# Patient Record
Sex: Male | Born: 1975 | Race: Black or African American | Hispanic: No | Marital: Single | Smoking: Current every day smoker
Health system: Southern US, Community
[De-identification: ages and names within clinical notes are randomized; demographics above are authoritative.]

## PROBLEM LIST (undated history)

## (undated) DIAGNOSIS — R011 Cardiac murmur, unspecified: Secondary | ICD-10-CM

## (undated) DIAGNOSIS — Z9289 Personal history of other medical treatment: Secondary | ICD-10-CM

## (undated) DIAGNOSIS — D649 Anemia, unspecified: Secondary | ICD-10-CM

## (undated) DIAGNOSIS — K859 Acute pancreatitis without necrosis or infection, unspecified: Secondary | ICD-10-CM

## (undated) DIAGNOSIS — I10 Essential (primary) hypertension: Secondary | ICD-10-CM

## (undated) DIAGNOSIS — G629 Polyneuropathy, unspecified: Secondary | ICD-10-CM

## (undated) DIAGNOSIS — S92352A Displaced fracture of fifth metatarsal bone, left foot, initial encounter for closed fracture: Secondary | ICD-10-CM

## (undated) DIAGNOSIS — J309 Allergic rhinitis, unspecified: Secondary | ICD-10-CM

## (undated) DIAGNOSIS — K279 Peptic ulcer, site unspecified, unspecified as acute or chronic, without hemorrhage or perforation: Secondary | ICD-10-CM

## (undated) DIAGNOSIS — J189 Pneumonia, unspecified organism: Secondary | ICD-10-CM

## (undated) DIAGNOSIS — J449 Chronic obstructive pulmonary disease, unspecified: Secondary | ICD-10-CM

## (undated) DIAGNOSIS — L309 Dermatitis, unspecified: Secondary | ICD-10-CM

## (undated) DIAGNOSIS — F1010 Alcohol abuse, uncomplicated: Secondary | ICD-10-CM

## (undated) HISTORY — PX: APPENDECTOMY: SHX54

---

## 2005-04-21 ENCOUNTER — Emergency Department: Payer: Self-pay | Admitting: Emergency Medicine

## 2005-04-21 IMAGING — CR DG TIBIA/FIBULA 2V*L*
1 series · 3 of 3 positions shown · non-contrast
Comparison: none

REASON FOR EXAM: Rule out fracture
COMMENTS:  LMP: (Male)

PROCEDURE:     DXR - DXR TIBIA AND FIBULA LT (LOWER L  - [DATE]  [DATE]
RESULT:     Multiple views of the LEFT tibia and fibula show no evidence of
fracture, foreign body or soft tissue swelling.

[Series 1: view not recorded · 0.17mm/px · 3 of 3 slices shown]
[im 1/3]
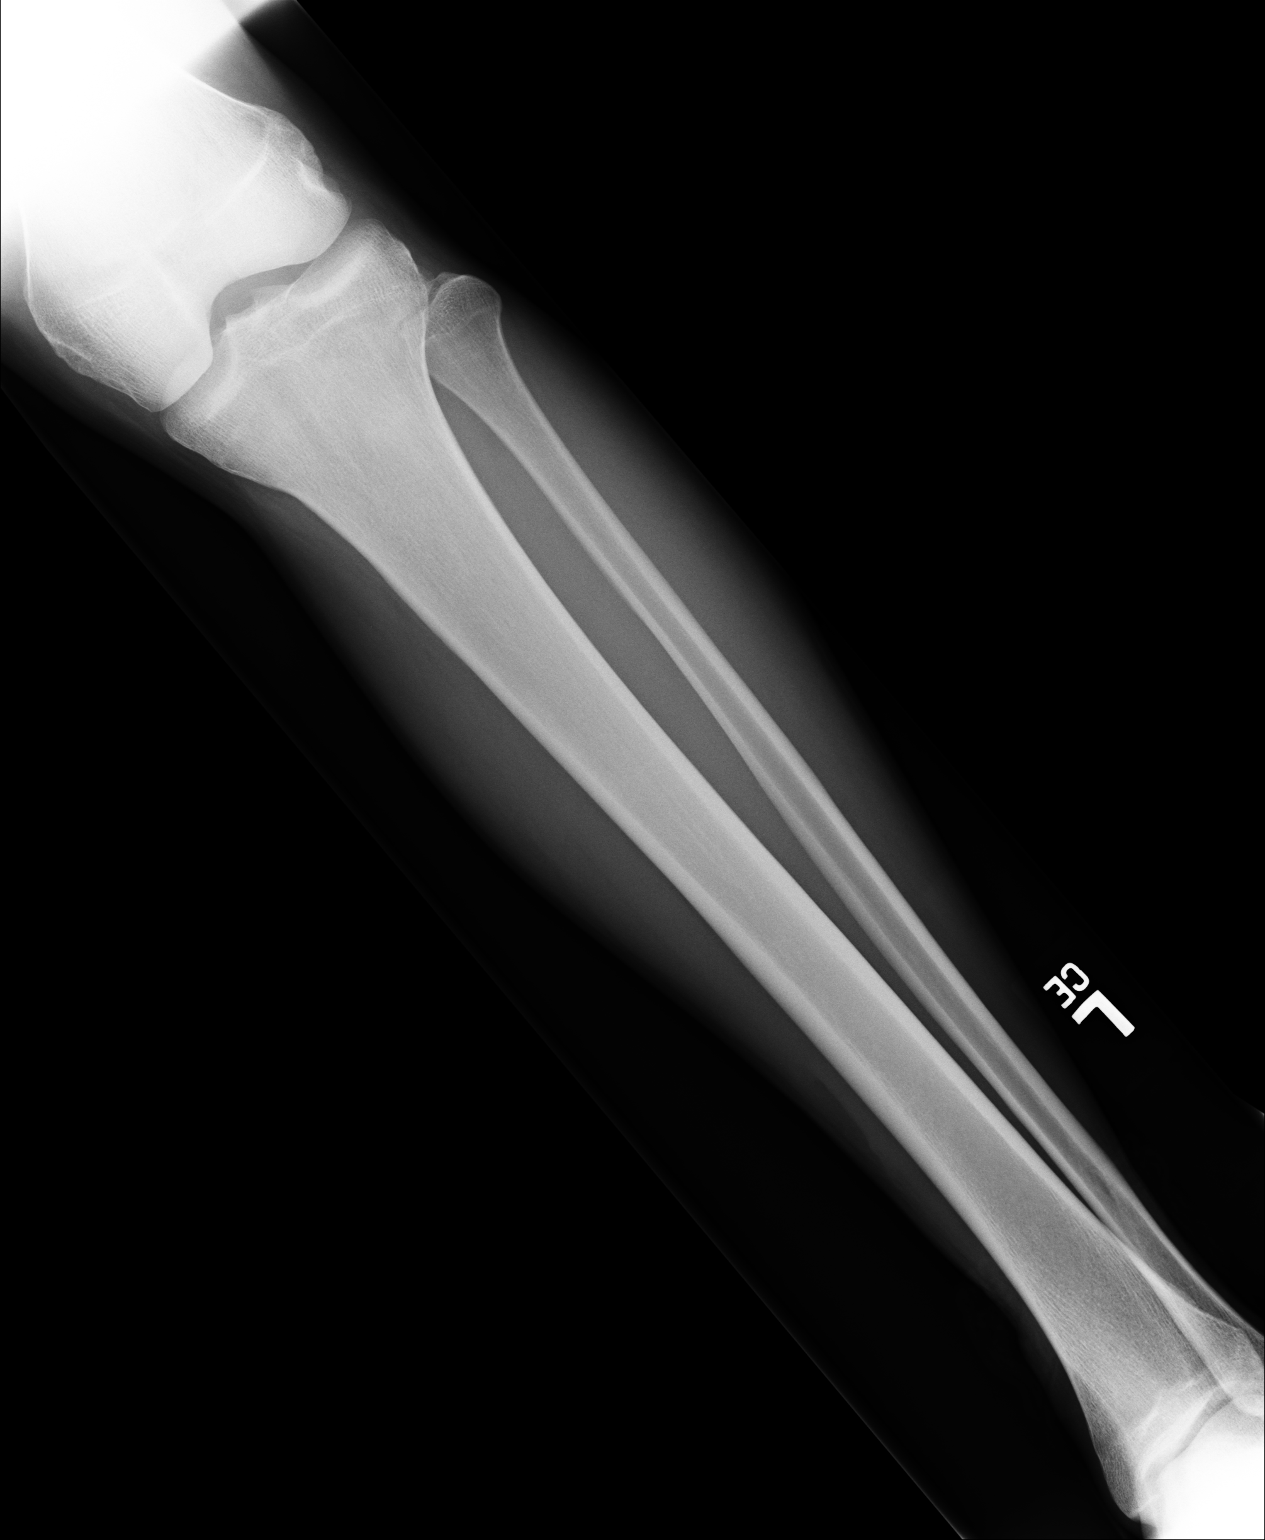
[im 2/3]
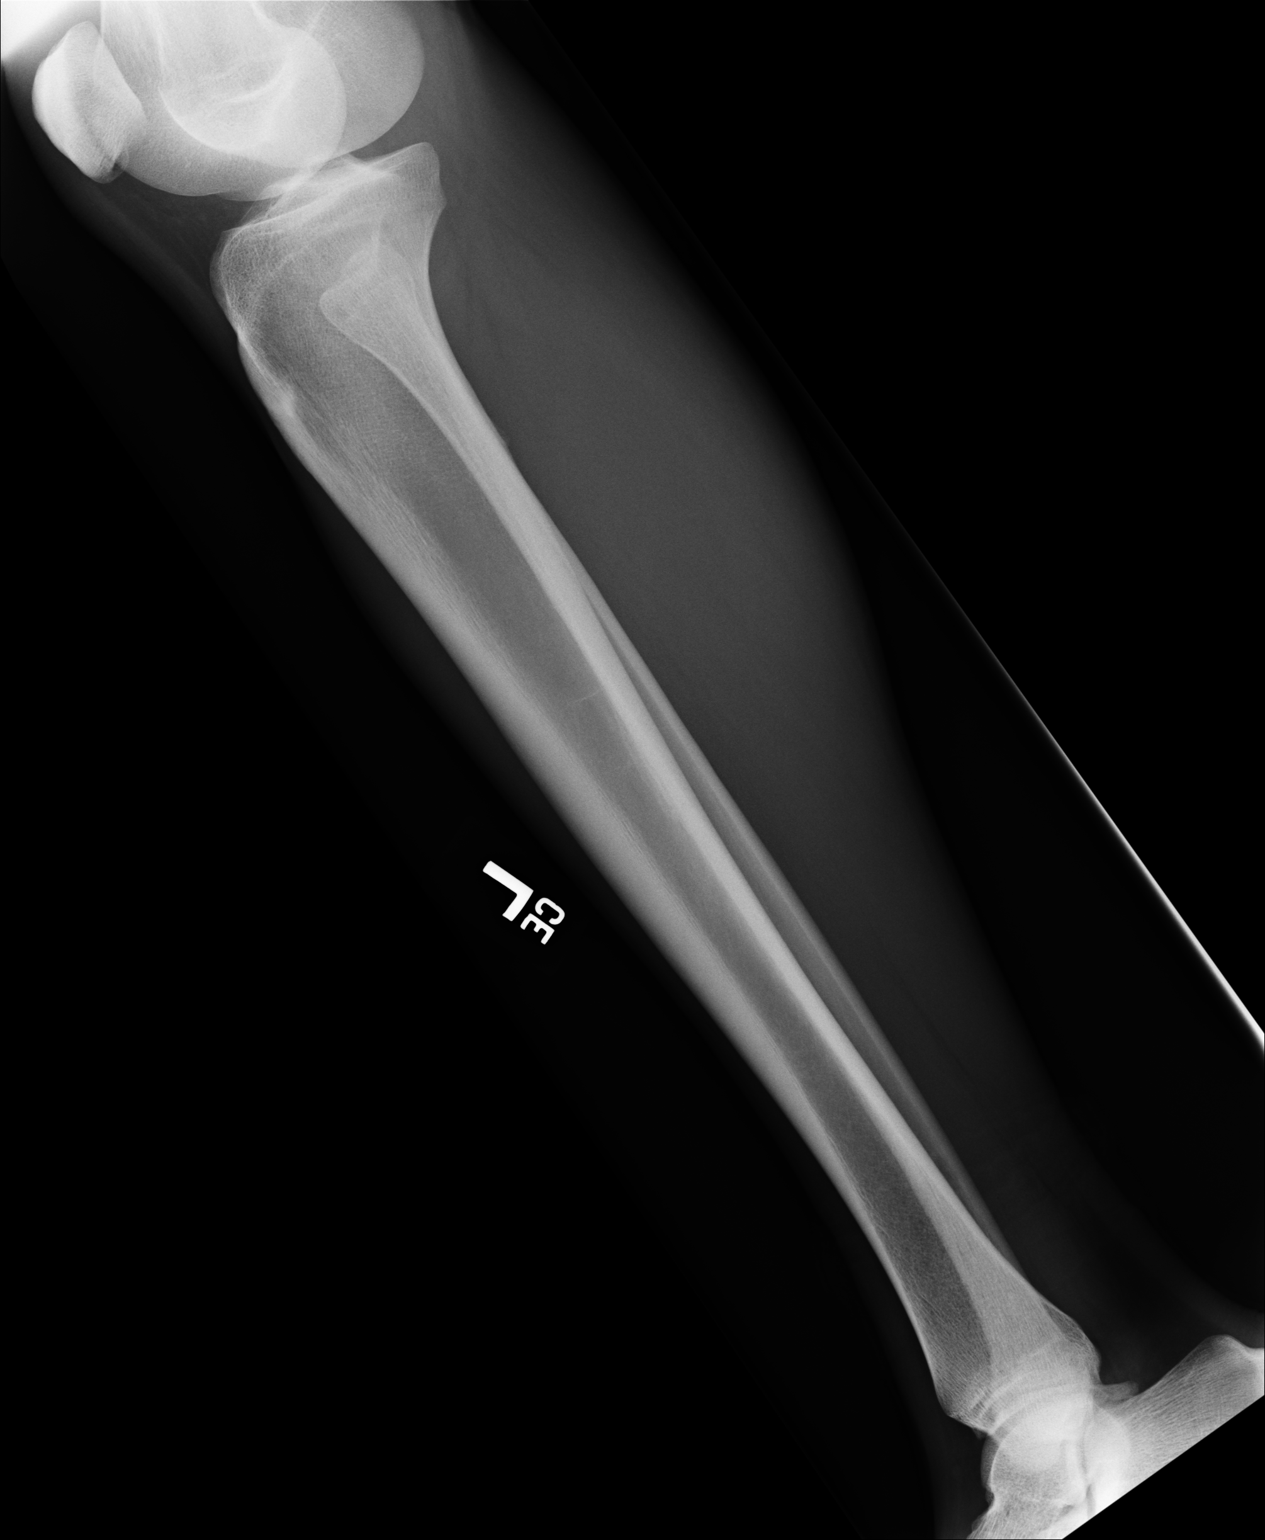
[im 3/3]
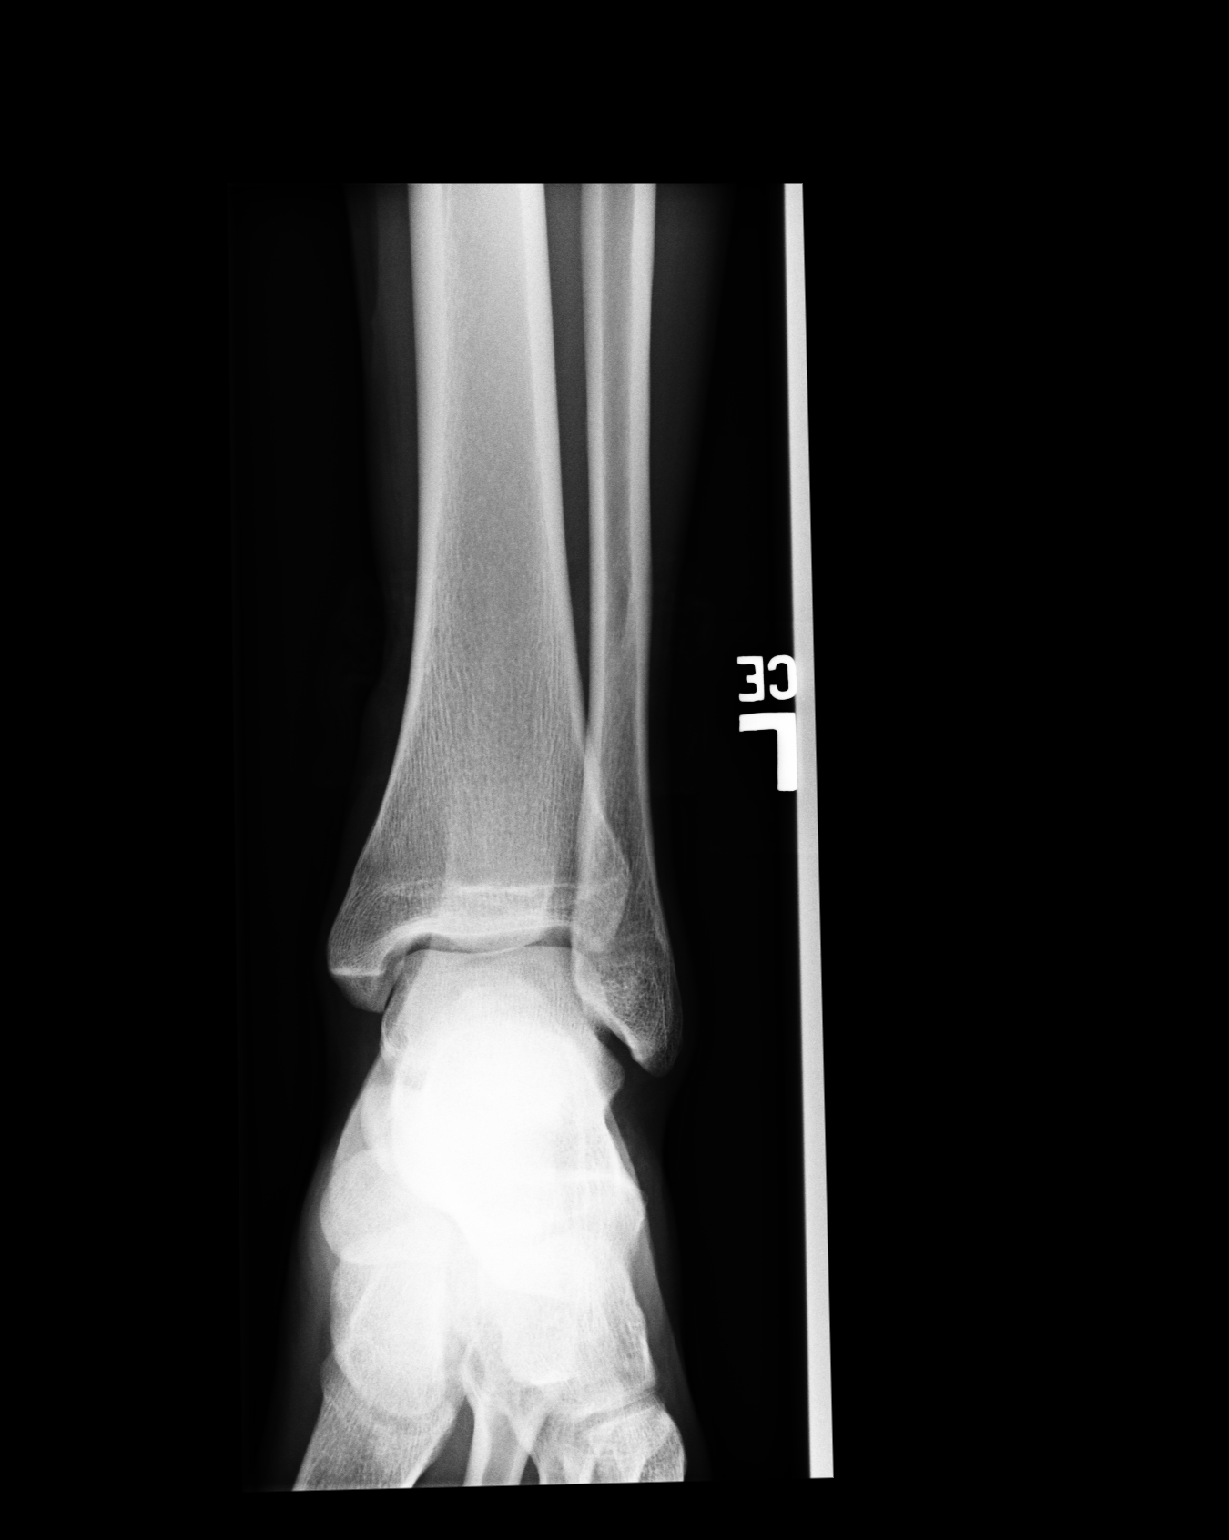

[3 of 3 positions shown; findings below may reference images not displayed]

IMPRESSION: Normal LEFT tibia and fibula.

## 2005-08-30 ENCOUNTER — Emergency Department: Payer: Self-pay | Admitting: Emergency Medicine

## 2008-11-08 ENCOUNTER — Emergency Department: Payer: Self-pay | Admitting: Emergency Medicine
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
## 2011-09-17 ENCOUNTER — Emergency Department: Payer: Self-pay | Admitting: Emergency Medicine

## 2013-02-04 ENCOUNTER — Emergency Department: Payer: Self-pay | Admitting: Internal Medicine

## 2013-08-07 ENCOUNTER — Emergency Department: Payer: Self-pay | Admitting: Emergency Medicine

## 2013-08-07 LAB — CBC
HCT: 45 % (ref 40.0–52.0)
MCH: 32.2 pg (ref 26.0–34.0)
MCHC: 35.4 g/dL (ref 32.0–36.0)
MCV: 91 fL (ref 80–100)
RBC: 4.95 10*6/uL (ref 4.40–5.90)
RDW: 13.3 % (ref 11.5–14.5)

## 2013-08-07 LAB — COMPREHENSIVE METABOLIC PANEL
Alkaline Phosphatase: 174 U/L — ABNORMAL HIGH (ref 50–136)
Anion Gap: 8 (ref 7–16)
BUN: 4 mg/dL — ABNORMAL LOW (ref 7–18)
Bilirubin,Total: 0.3 mg/dL (ref 0.2–1.0)
Calcium, Total: 8.7 mg/dL (ref 8.5–10.1)
Creatinine: 0.94 mg/dL (ref 0.60–1.30)
EGFR (African American): >60
Glucose: 116 mg/dL — ABNORMAL HIGH (ref 65–99)
Osmolality: 268 (ref 275–301)
Potassium: 3.9 mmol/L (ref 3.5–5.1)

## 2013-08-07 LAB — URINALYSIS, COMPLETE
Glucose,UR: NEGATIVE mg/dL (ref 0–75)
Ketone: NEGATIVE
Nitrite: NEGATIVE
Ph: 5 (ref 4.5–8.0)
Protein: 100
Specific Gravity: 1.003 (ref 1.003–1.030)
Squamous Epithelial: NONE SEEN

## 2013-08-07 LAB — DRUG SCREEN, URINE
Amphetamines, Ur Screen: NEGATIVE (ref ?–1000)
Barbiturates, Ur Screen: NEGATIVE (ref ?–200)
Benzodiazepine, Ur Scrn: NEGATIVE (ref ?–200)
Cannabinoid 50 Ng, Ur ~~LOC~~: NEGATIVE (ref ?–50)
MDMA (Ecstasy)Ur Screen: NEGATIVE (ref ?–500)
Phencyclidine (PCP) Ur S: NEGATIVE (ref ?–25)
Tricyclic, Ur Screen: NEGATIVE (ref ?–1000)

## 2013-08-07 LAB — ETHANOL
Ethanol %: 0.438 % (ref 0.000–0.080)
Ethanol: 438 mg/dL

## 2013-08-07 LAB — TSH: Thyroid Stimulating Horm: 0.83 u[IU]/mL

## 2013-08-08 LAB — ETHANOL
Ethanol %: 0.122 % — ABNORMAL HIGH (ref 0.000–0.080)
Ethanol %: 0.202 % — ABNORMAL HIGH (ref 0.000–0.080)
Ethanol: 122 mg/dL

## 2014-01-18 ENCOUNTER — Emergency Department: Payer: Self-pay | Admitting: Internal Medicine

## 2014-01-18 LAB — URINALYSIS, COMPLETE
BACTERIA: NONE SEEN
BILIRUBIN, UR: NEGATIVE
Glucose,UR: NEGATIVE mg/dL (ref 0–75)
Granular Cast: 79
Hyaline Cast: 1
KETONE: NEGATIVE
Leukocyte Esterase: NEGATIVE
Nitrite: NEGATIVE
Ph: 5 (ref 4.5–8.0)
SPECIFIC GRAVITY: 1.013 (ref 1.003–1.030)
Squamous Epithelial: <1
WBC UR: NONE SEEN /HPF (ref 0–5)

## 2014-01-18 LAB — DRUG SCREEN, URINE

## 2014-01-18 LAB — ETHANOL

## 2014-01-18 LAB — COMPREHENSIVE METABOLIC PANEL
ALT: 32 U/L (ref 12–78)
AST: 54 U/L — AB (ref 15–37)
Albumin: 3.3 g/dL — ABNORMAL LOW (ref 3.4–5.0)
Alkaline Phosphatase: 174 U/L — ABNORMAL HIGH
Anion Gap: 13 (ref 7–16)
BUN: 9 mg/dL (ref 7–18)
Bilirubin,Total: 0.4 mg/dL (ref 0.2–1.0)
Calcium, Total: 8.8 mg/dL (ref 8.5–10.1)
Chloride: 103 mmol/L (ref 98–107)
Co2: 18 mmol/L — ABNORMAL LOW (ref 21–32)
Creatinine: 1.25 mg/dL (ref 0.60–1.30)
EGFR (Non-African Amer.): >60
Glucose: 178 mg/dL — ABNORMAL HIGH (ref 65–99)
Osmolality: 271 (ref 275–301)
Potassium: 4 mmol/L (ref 3.5–5.1)
Sodium: 134 mmol/L — ABNORMAL LOW (ref 136–145)
Total Protein: 7.8 g/dL (ref 6.4–8.2)

## 2014-01-18 LAB — PROTIME-INR
INR: 0.9
Prothrombin Time: 12.3 secs (ref 11.5–14.7)

## 2014-01-18 LAB — CBC
HCT: 40.4 % (ref 40.0–52.0)
HGB: 13.4 g/dL (ref 13.0–18.0)
MCH: 31 pg (ref 26.0–34.0)
MCHC: 33.2 g/dL (ref 32.0–36.0)
MCV: 93 fL (ref 80–100)
PLATELETS: 122 10*3/uL — AB (ref 150–440)
RBC: 4.33 10*6/uL — AB (ref 4.40–5.90)
RDW: 13.9 % (ref 11.5–14.5)
WBC: 6.6 10*3/uL (ref 3.8–10.6)

## 2014-01-18 IMAGING — CR DG CHEST 2V
1 series · 2 of 2 positions shown · non-contrast
Comparison: None.

CLINICAL DATA: Seizure today, no hx seizure, smoker, no prev surg

EXAM:
CHEST  2 VIEW

[Series 1: w chest pa · 0.14mm/px · 2 of 2 slices shown]
[im 1/2]
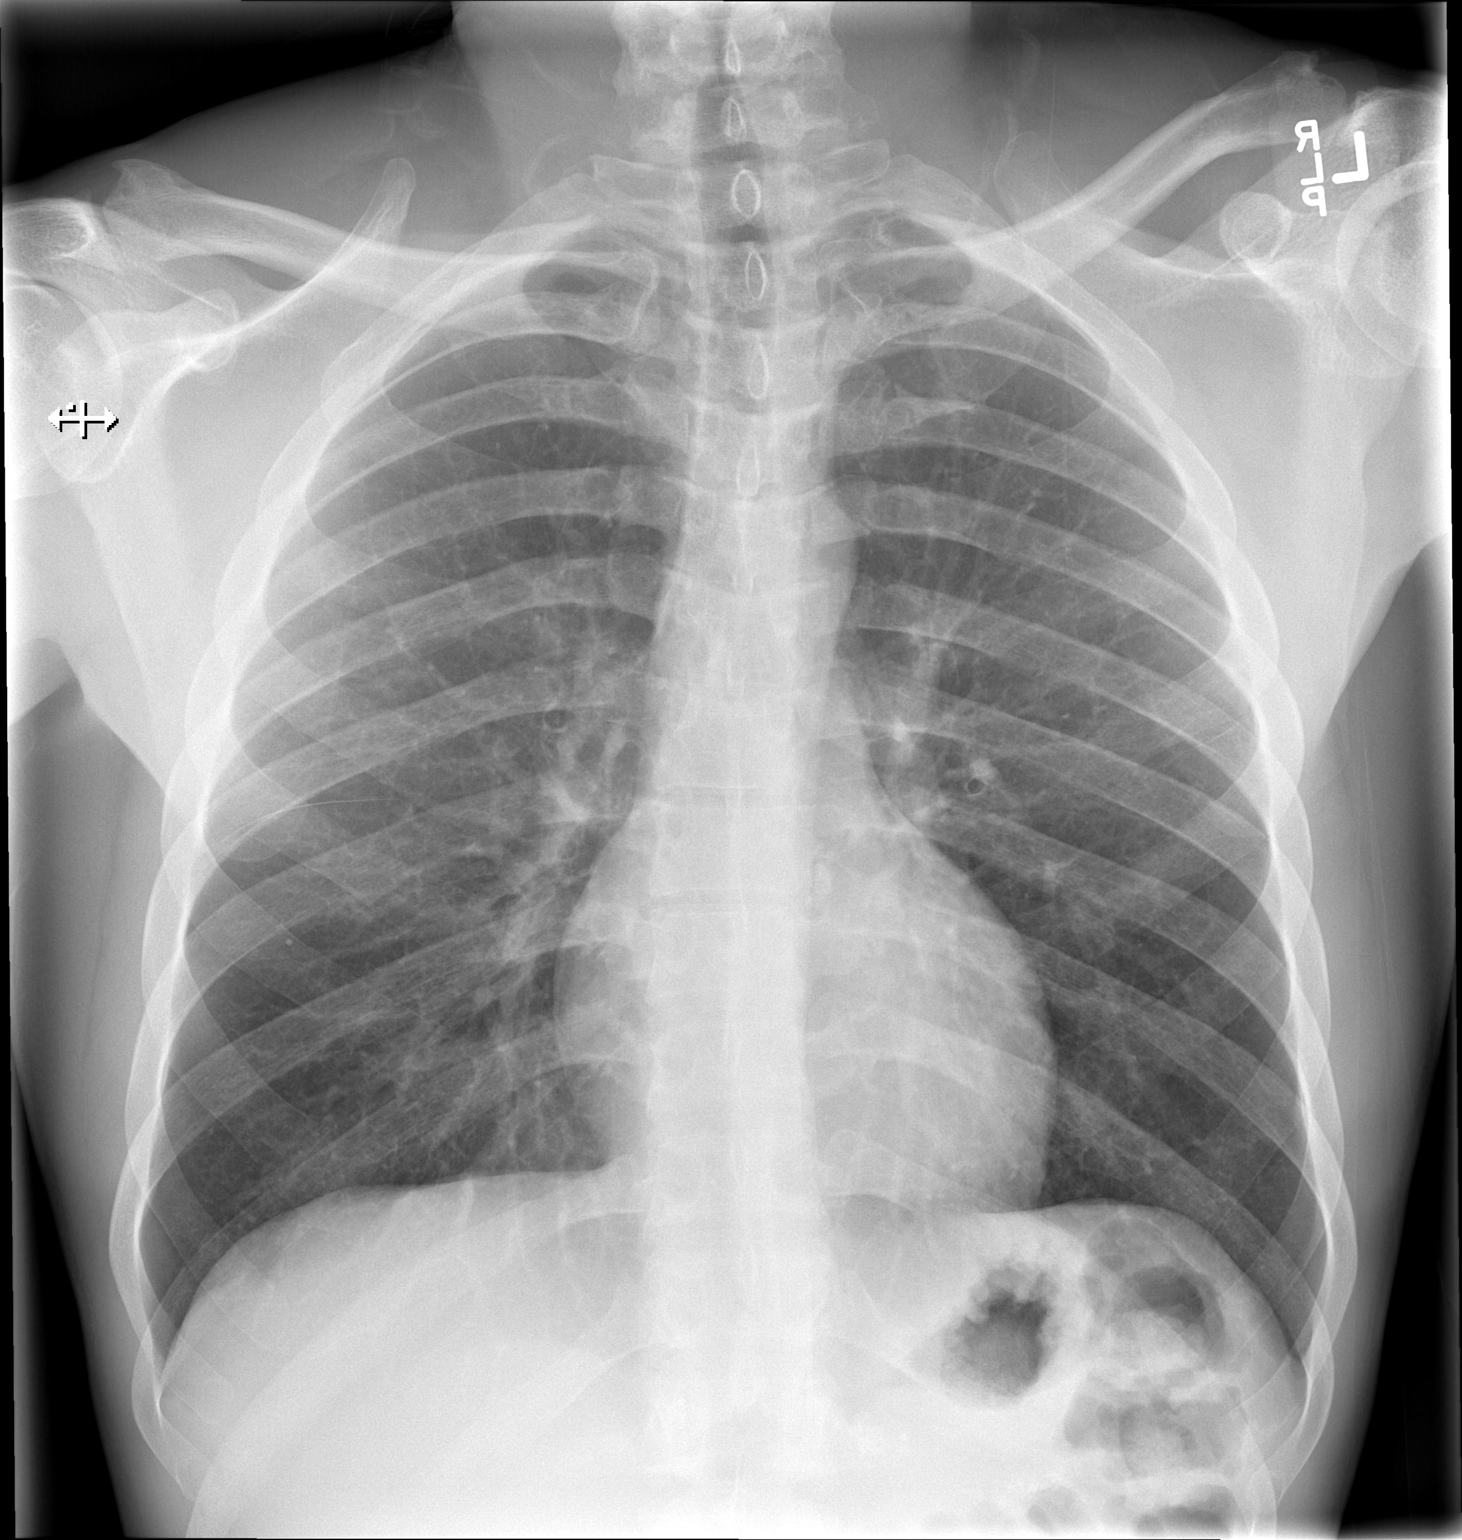
[im 2/2]
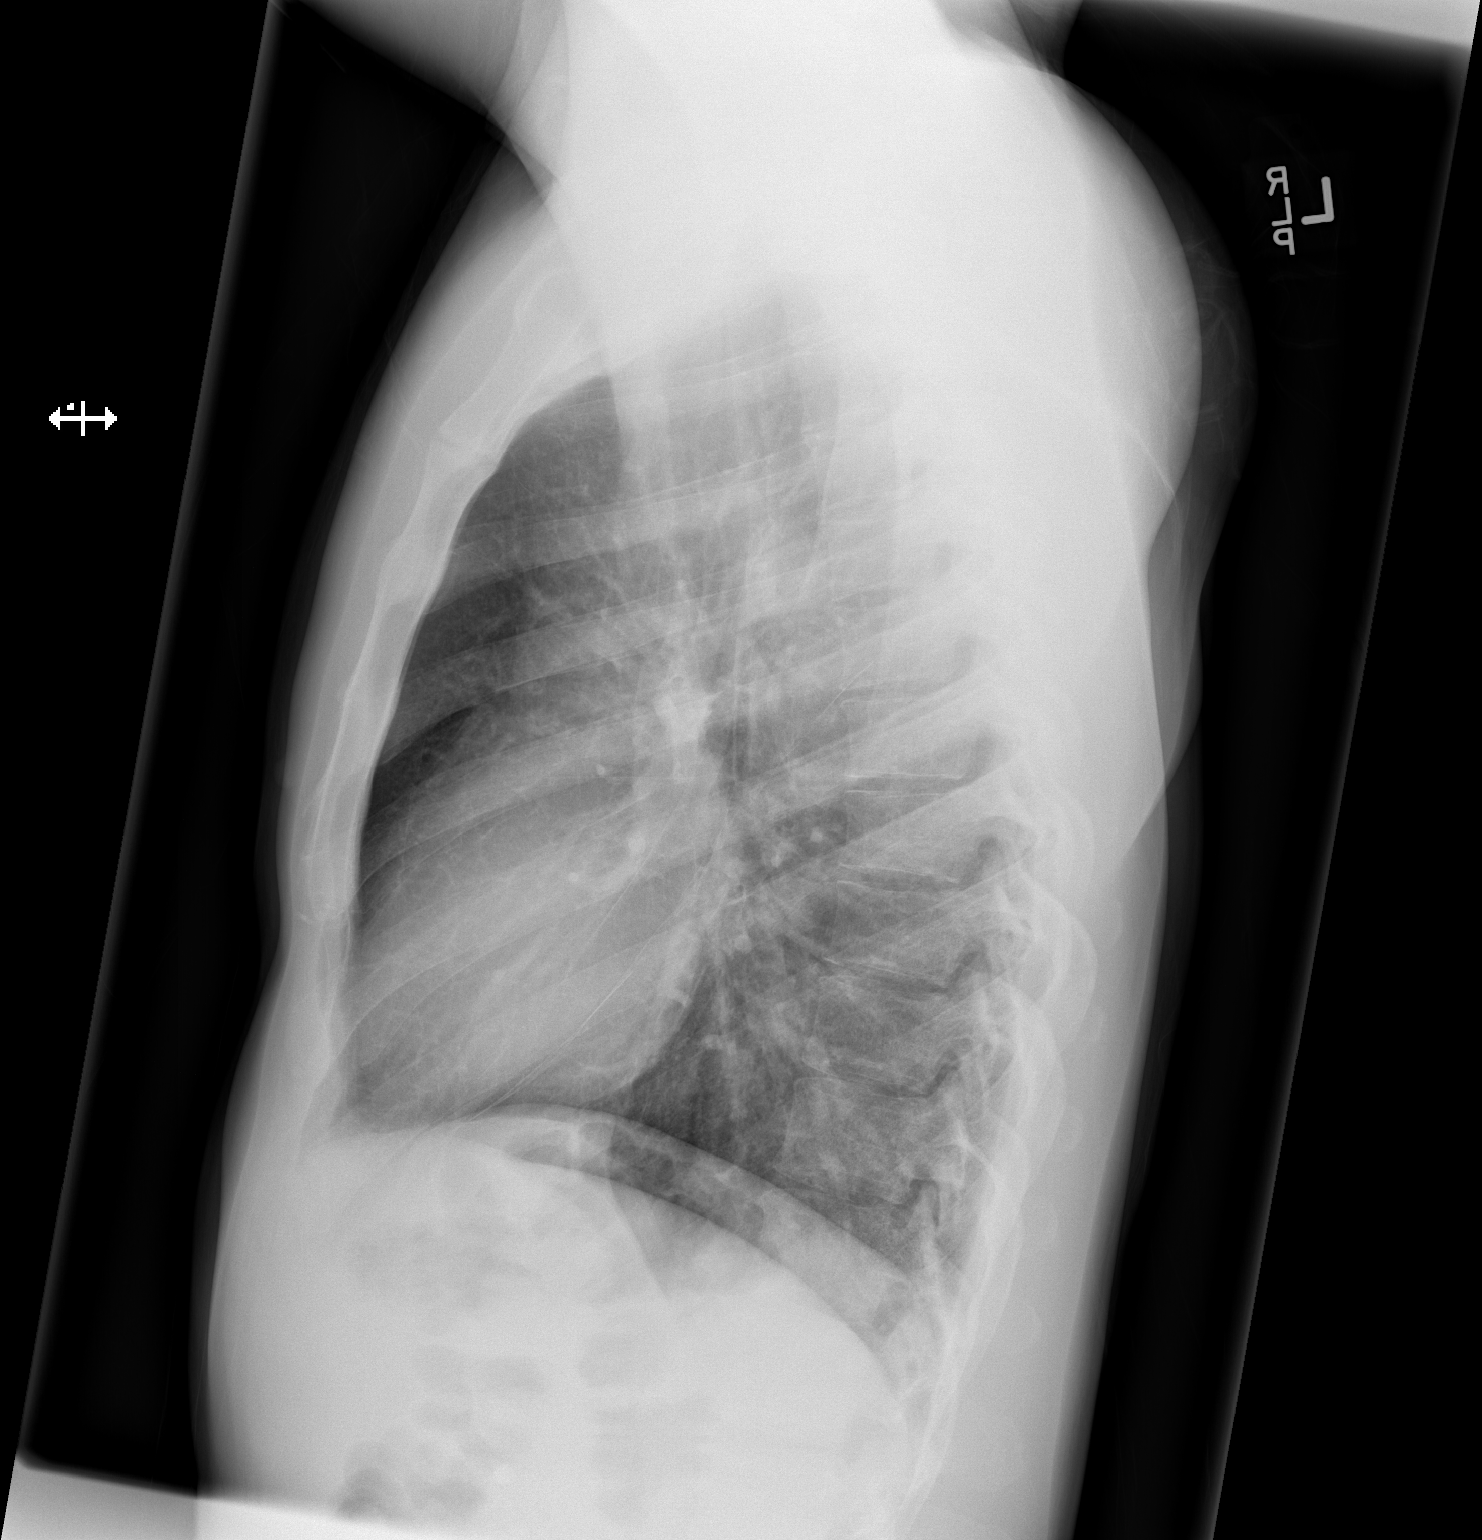

[2 of 2 positions shown; findings below may reference images not displayed]

FINDINGS: The heart size and mediastinal contours are within normal limits.
Both lungs are clear. The visualized skeletal structures are
unremarkable.
IMPRESSION: No active cardiopulmonary disease.
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
## 2014-01-18 IMAGING — CT CT HEAD WITHOUT CONTRAST
1 series · 16 of 30 positions shown, 20 images · non-contrast
Comparison: None.

CLINICAL DATA: Seizure.

EXAM:
CT HEAD WITHOUT CONTRAST
TECHNIQUE: Contiguous axial images were obtained from the base of the skull
through the vertex without intravenous contrast.

[Series 2: head wo · axial · 0.41mm/px · z∈[+61,+191]mm · 16 of 30 slices shown, 20 images]
[im 2/30  brain]
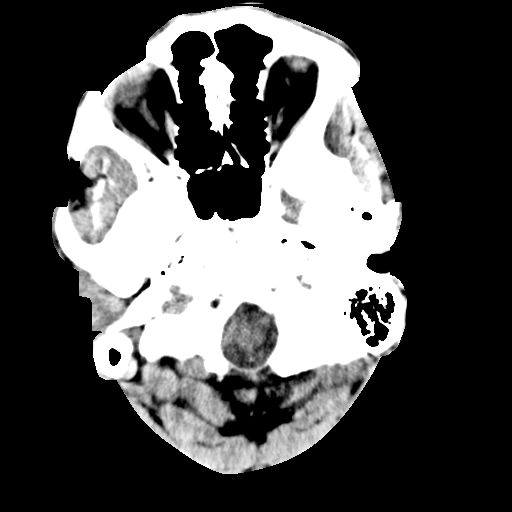
[im 2/30  bone]
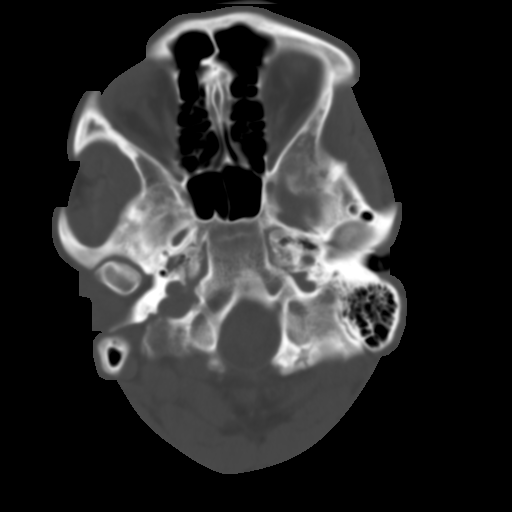
[im 4/30  brain]
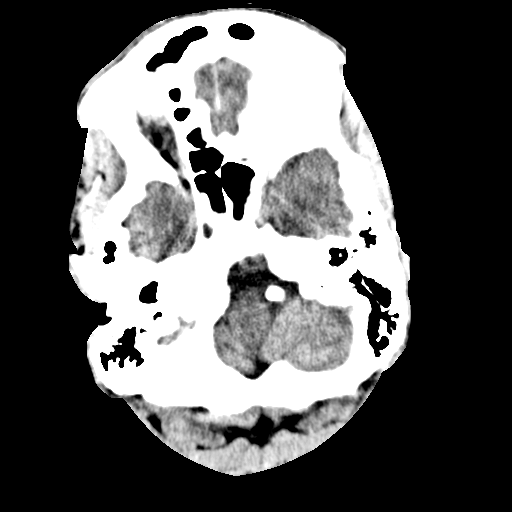
[im 6/30  brain]
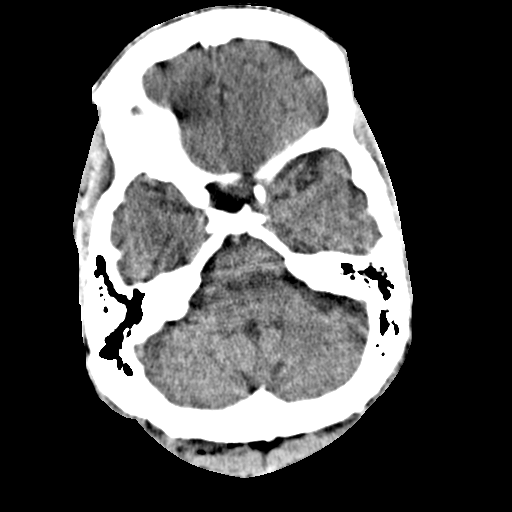
[im 8/30  brain]
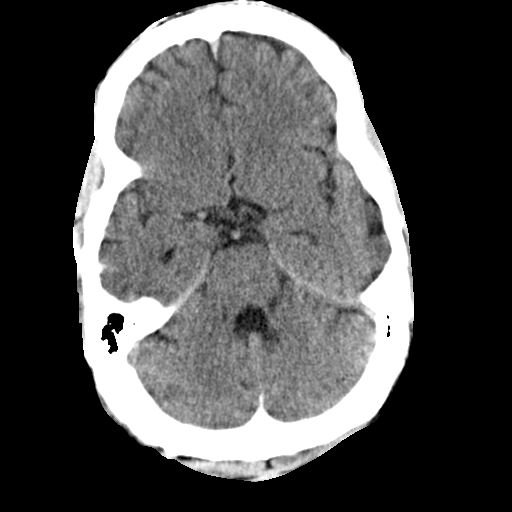
[im 9/30  brain]
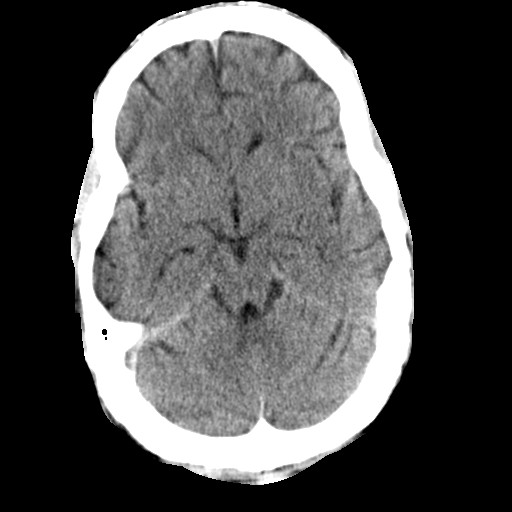
[im 9/30  bone]
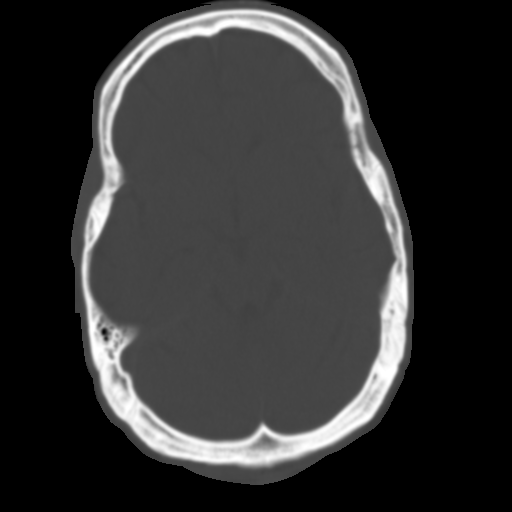
[im 11/30  brain]
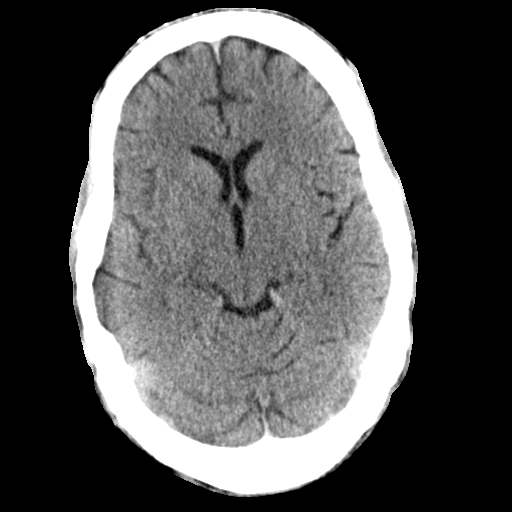
[im 13/30  brain]
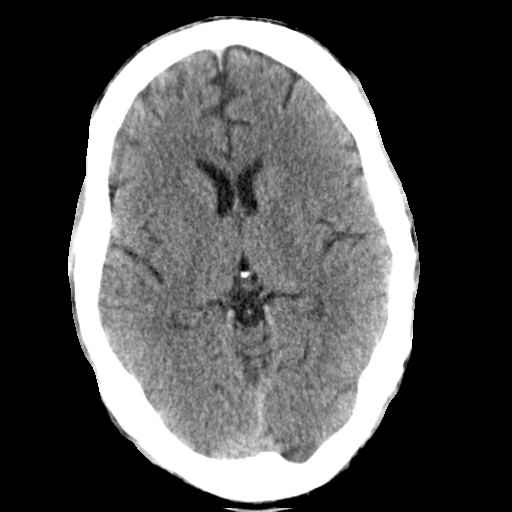
[im 15/30  brain]
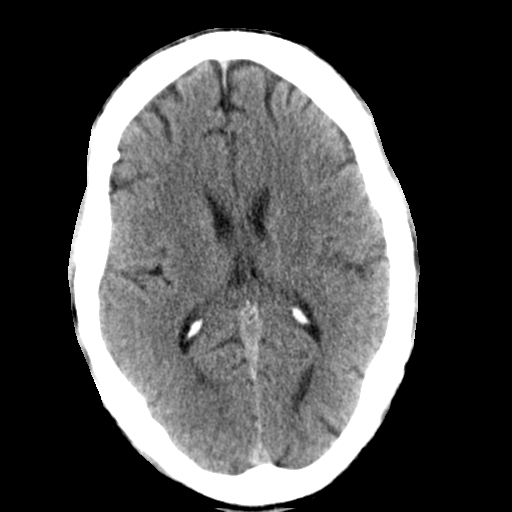
[im 16/30  brain]
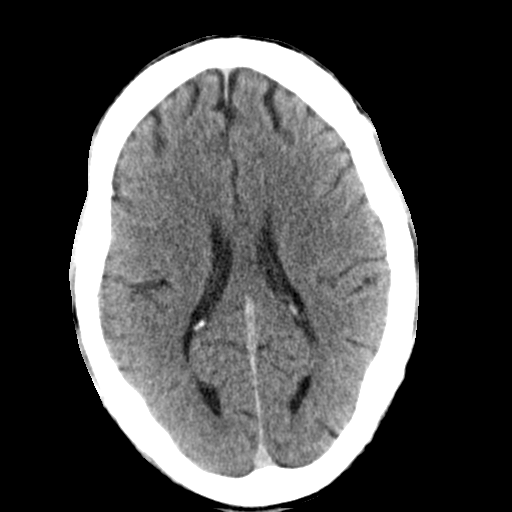
[im 16/30  bone]
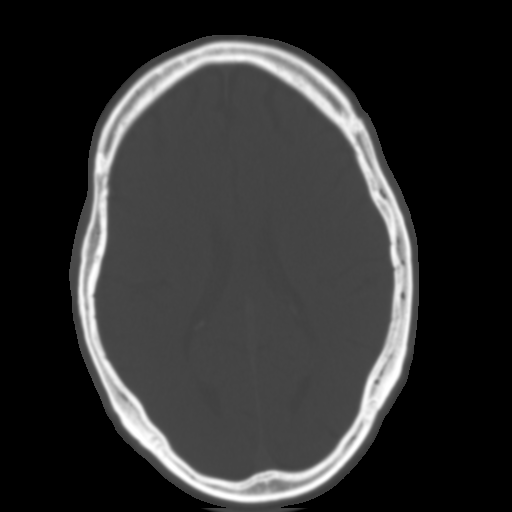
[im 18/30  brain]
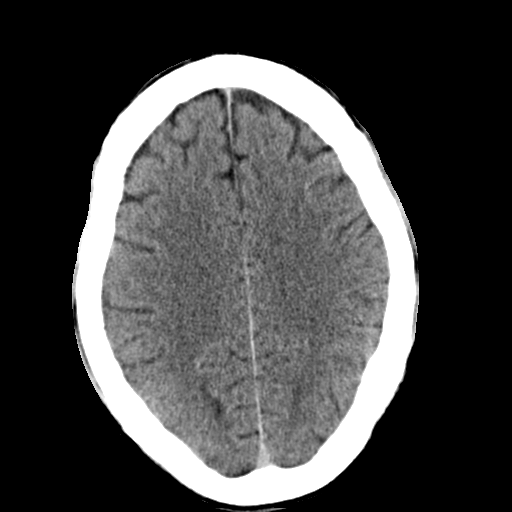
[im 20/30  brain]
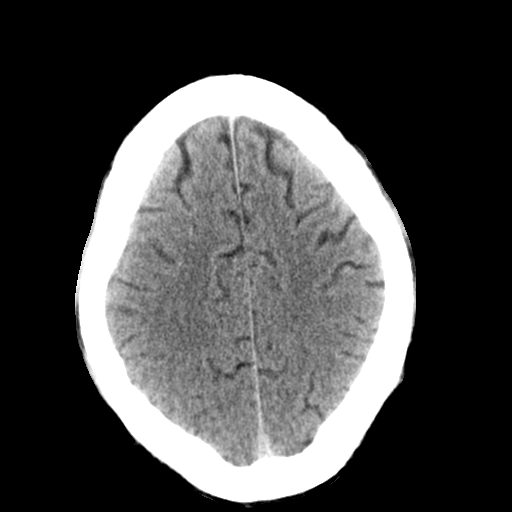
[im 22/30  brain]
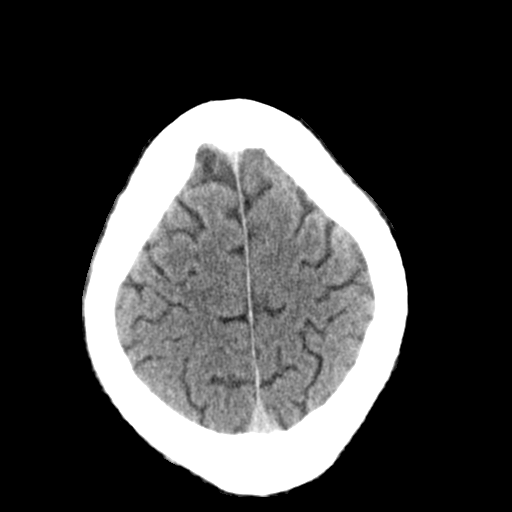
[im 23/30  brain]
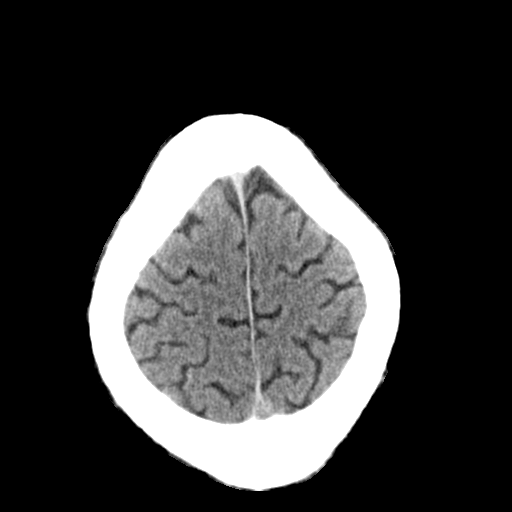
[im 23/30  bone]
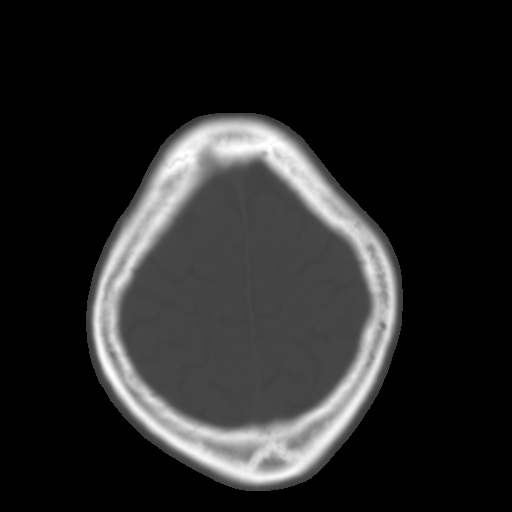
[im 25/30  brain]
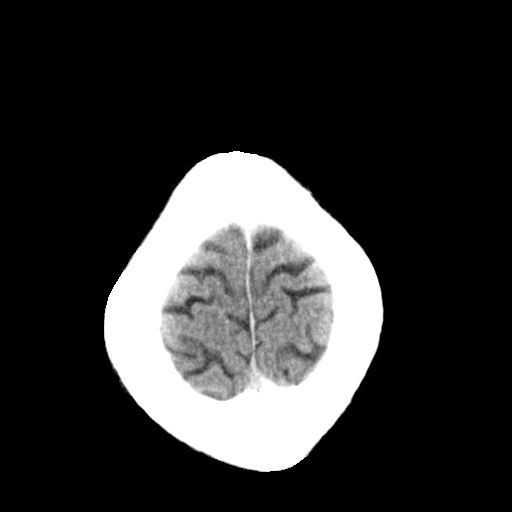
[im 27/30  brain]
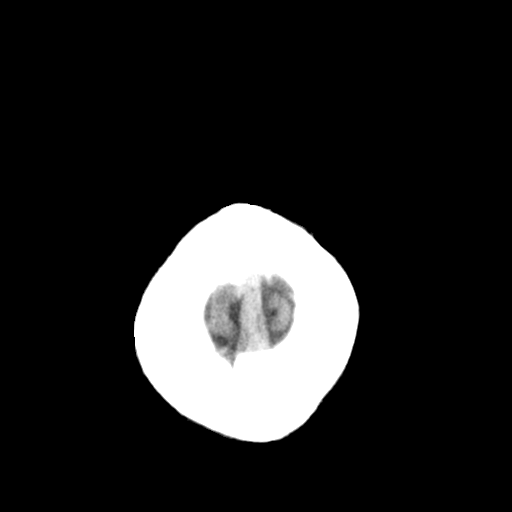
[im 29/30  brain]
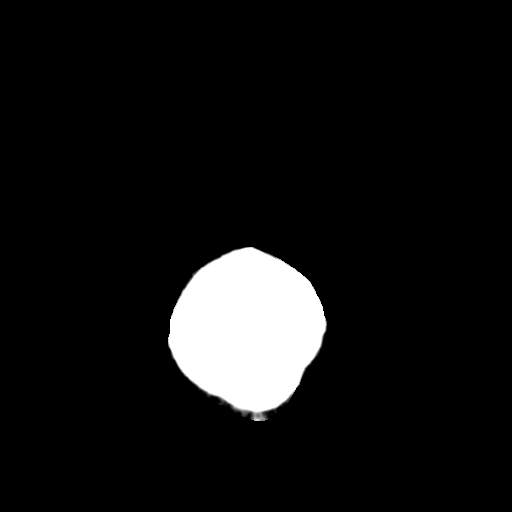

[16 of 30 positions shown; findings below may reference images not displayed]

FINDINGS: No evidence of intracranial hemorrhage, brain edema, or other signs
of acute infarction. No evidence of intracranial mass lesion or mass
effect. No abnormal extraaxial fluid collections identified.
Ventricles are normal in size. No skull abnormality identified.
IMPRESSION: Negative noncontrast head CT.
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
## 2015-06-07 ENCOUNTER — Emergency Department
Admission: EM | Admit: 2015-06-07 | Discharge: 2015-06-07 | Disposition: A | Discharge status: Home / Self Care | Payer: Self-pay | Attending: Emergency Medicine | Admitting: Emergency Medicine

## 2015-06-07 ENCOUNTER — Encounter: Payer: Self-pay | Admitting: Emergency Medicine

## 2015-06-07 ENCOUNTER — Emergency Department: Payer: Self-pay

## 2015-06-07 DIAGNOSIS — Y998 Other external cause status: Secondary | ICD-10-CM | POA: Insufficient documentation

## 2015-06-07 DIAGNOSIS — W25XXXA Contact with sharp glass, initial encounter: Secondary | ICD-10-CM | POA: Insufficient documentation

## 2015-06-07 DIAGNOSIS — S51812A Laceration without foreign body of left forearm, initial encounter: Secondary | ICD-10-CM

## 2015-06-07 DIAGNOSIS — Z720 Tobacco use: Secondary | ICD-10-CM | POA: Insufficient documentation

## 2015-06-07 DIAGNOSIS — Y9389 Activity, other specified: Secondary | ICD-10-CM | POA: Insufficient documentation

## 2015-06-07 DIAGNOSIS — Y9289 Other specified places as the place of occurrence of the external cause: Secondary | ICD-10-CM | POA: Insufficient documentation

## 2015-06-07 DIAGNOSIS — S51822A Laceration with foreign body of left forearm, initial encounter: Secondary | ICD-10-CM | POA: Insufficient documentation
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
## 2015-06-07 MED ORDER — TRAMADOL HCL 50 MG PO TABS
50.0000 mg | ORAL_TABLET | Freq: Four times a day (QID) | ORAL | Status: DC | PRN
Start: 2015-06-07 — End: 2018-07-06

## 2015-06-07 MED ORDER — BACITRACIN-NEOMYCIN-POLYMYXIN 400-5-5000 EX OINT
TOPICAL_OINTMENT | Freq: Once | CUTANEOUS | Status: AC
  Administered 2015-06-07: 1 via TOPICAL
  Filled 2015-06-07: qty 1

## 2015-06-07 MED ORDER — LIDOCAINE-EPINEPHRINE (PF) 1 %-1:200000 IJ SOLN
INTRAMUSCULAR | Status: AC
  Administered 2015-06-07: 30 mL
  Filled 2015-06-07: qty 30

## 2015-06-07 NOTE — Discharge Instructions (Signed)
Follow discharge care instructions. 
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
## 2016-05-21 ENCOUNTER — Emergency Department
Admission: EM | Admit: 2016-05-21 | Discharge: 2016-05-22 | Disposition: A | Discharge status: Home / Self Care | Payer: Self-pay | Attending: Emergency Medicine | Admitting: Emergency Medicine

## 2016-05-21 ENCOUNTER — Encounter: Payer: Self-pay | Admitting: *Deleted

## 2016-05-21 DIAGNOSIS — R200 Anesthesia of skin: Secondary | ICD-10-CM | POA: Insufficient documentation

## 2016-05-21 DIAGNOSIS — F17210 Nicotine dependence, cigarettes, uncomplicated: Secondary | ICD-10-CM | POA: Insufficient documentation

## 2016-05-21 LAB — TROPONIN I: Troponin I: <0.03 ng/mL (ref <0.03)

## 2016-05-21 LAB — URINALYSIS COMPLETE WITH MICROSCOPIC (ARMC ONLY)
BILIRUBIN URINE: NEGATIVE
Bacteria, UA: NONE SEEN
GLUCOSE, UA: NEGATIVE mg/dL
HGB URINE DIPSTICK: NEGATIVE
KETONES UR: NEGATIVE mg/dL
Leukocytes, UA: NEGATIVE
NITRITE: NEGATIVE
Protein, ur: NEGATIVE mg/dL
SPECIFIC GRAVITY, URINE: 1.002 — AB (ref 1.005–1.030)
Squamous Epithelial / LPF: NONE SEEN
WBC, UA: NONE SEEN WBC/hpf (ref 0–5)
pH: 5 (ref 5.0–8.0)

## 2016-05-21 LAB — CBC WITH DIFFERENTIAL/PLATELET
Basophils Absolute: 0 10*3/uL (ref 0–0.1)
Basophils Relative: 0 %
EOS ABS: 0.1 10*3/uL (ref 0–0.7)
EOS PCT: 1 %
HCT: 42.9 % (ref 40.0–52.0)
HEMOGLOBIN: 14.7 g/dL (ref 13.0–18.0)
LYMPHS ABS: 4.6 10*3/uL — AB (ref 1.0–3.6)
LYMPHS PCT: 50 %
MCH: 31.3 pg (ref 26.0–34.0)
MCHC: 34.4 g/dL (ref 32.0–36.0)
MCV: 91 fL (ref 80.0–100.0)
MONOS PCT: 6 %
Monocytes Absolute: 0.5 10*3/uL (ref 0.2–1.0)
Neutro Abs: 4 10*3/uL (ref 1.4–6.5)
Neutrophils Relative %: 43 %
PLATELETS: 187 10*3/uL (ref 150–440)
RBC: 4.71 MIL/uL (ref 4.40–5.90)
RDW: 13.7 % (ref 11.5–14.5)
WBC: 9.3 10*3/uL (ref 3.8–10.6)

## 2016-05-21 LAB — COMPREHENSIVE METABOLIC PANEL
ALT: 20 U/L (ref 17–63)
AST: 34 U/L (ref 15–41)
Albumin: 4.5 g/dL (ref 3.5–5.0)
Alkaline Phosphatase: 99 U/L (ref 38–126)
Anion gap: 11 (ref 5–15)
BILIRUBIN TOTAL: 0.5 mg/dL (ref 0.3–1.2)
BUN: 5 mg/dL — AB (ref 6–20)
CALCIUM: 9.1 mg/dL (ref 8.9–10.3)
CHLORIDE: 106 mmol/L (ref 101–111)
CO2: 22 mmol/L (ref 22–32)
CREATININE: 0.73 mg/dL (ref 0.61–1.24)
Glucose, Bld: 95 mg/dL (ref 65–99)
Potassium: 3.5 mmol/L (ref 3.5–5.1)
Sodium: 139 mmol/L (ref 135–145)
TOTAL PROTEIN: 7.8 g/dL (ref 6.5–8.1)

## 2016-05-21 LAB — ETHANOL: ALCOHOL ETHYL (B): 279 mg/dL — AB (ref <5)

## 2016-05-21 NOTE — ED Triage Notes (Signed)
Pt brought in via ems.  Pt reports numbness and tingling in arms and legs.  Pt denies headache.  No pain. Pt reports he drinks every day and admits to etoh use today.

## 2016-05-22 NOTE — ED Provider Notes (Signed)
[facility] Emergency Department Provider Note   ____________________________________________   I have reviewed the triage vital signs and the nursing notes.   HISTORY  Chief Complaint Numbness and Fever   History limited by: Not Limited   HPI Lance Green is a 40 y.o. male who presents to the emergency department today because of worsening bilateral upper extremity numbness. Patient states that the symptoms have been going on for the past 7 months. He is working on getting disability. He states that he can tell he is touching things but the sensation is none. In addition he feels like his hands are somewhat stiff and swollen. He says that he was talking to his disability worker yesterday and she told him he could potentially have a life-threatening disease and he had to go to the emergency department. He is unsure what disease or process she thought was going on. He does not have a primary care physician.   No past medical history on file.  There are no active problems to display for this patient.   No past surgical history on file.  Prior to Admission medications   Medication Sig Start Date End Date Taking? Authorizing Provider  traMADol (ULTRAM) 50 MG tablet Take 1 tablet (50 mg total) by mouth every 6 (six) hours as needed for moderate pain. Patient not taking: Reported on 05/21/2016 06/07/15   Joni Reiningonald K Smith, PA-C    Allergies Review of patient's allergies indicates no known allergies.  No family history on file.  Social History Social History  Substance Use Topics  . Smoking status: Current Every Day Smoker    Packs/day: 0.50    Types: Cigarettes  . Smokeless tobacco: Never Used  . Alcohol use Yes     Comment: occass.    Review of Systems  Constitutional: Negative for fever. Cardiovascular: Negative for chest pain. Respiratory: Negative for shortness of breath. Gastrointestinal: Negative for abdominal pain, vomiting and  diarrhea. Neurological: Negative for headaches, focal weakness. Positive for bilateral upper extremity numbness.  10-point ROS otherwise negative.  ____________________________________________   PHYSICAL EXAM:  VITAL SIGNS: ED Triage Vitals  Enc Vitals Group     BP 05/21/16 2012 131/79     Pulse Rate 05/21/16 2012 84     Resp 05/21/16 2012 18     Temp 05/21/16 2012 98 F (36.7 C)     Temp Source 05/21/16 2012 Oral     SpO2 05/21/16 2012 98 %     Weight 05/21/16 2012 162 lb (73.5 kg)     Height 05/21/16 2012 5\' 8"  (1.727 m)     Head Circumference --      Peak Flow --      Pain Score 05/21/16 2022 0   Constitutional: Alert and oriented. Well appearing and in no distress. Eyes: Conjunctivae are normal. PERRL. Normal extraocular movements. ENT   Head: Normocephalic and atraumatic.   Nose: No congestion/rhinnorhea.   Mouth/Throat: Mucous membranes are moist.   Neck: No stridor. Hematological/Lymphatic/Immunilogical: No cervical lymphadenopathy. Cardiovascular: Normal rate, regular rhythm.  No murmurs, rubs, or gallops. Respiratory: Normal respiratory effort without tachypnea nor retractions. Breath sounds are clear and equal bilaterally. No wheezes/rales/rhonchi. Gastrointestinal: Soft and nontender. No distention.  Genitourinary: Deferred Musculoskeletal: Normal range of motion in all extremities. No joint effusions.  No lower extremity tenderness nor edema. Radial pulse 2+ in bilateral upper extremities.  Neurologic:  Normal speech and language. Subjective numbness to bilateral upper extremities. Skin:  Skin is warm, dry and intact.  No rash noted. Psychiatric: Mood and affect are normal. Speech and behavior are normal. Patient exhibits appropriate insight and judgment.  ____________________________________________    LABS (pertinent positives/negatives)  Labs Reviewed  CBC WITH DIFFERENTIAL/PLATELET - Abnormal; Notable for the following:       Result Value    Lymphs Abs 4.6 (*)    All other components within normal limits  COMPREHENSIVE METABOLIC PANEL - Abnormal; Notable for the following:    BUN 5 (*)    All other components within normal limits  URINALYSIS COMPLETEWITH MICROSCOPIC ([facility] ONLY) - Abnormal; Notable for the following:    Color, Urine COLORLESS (*)    APPearance CLEAR (*)    Specific Gravity, Urine 1.002 (*)    All other components within normal limits  ETHANOL - Abnormal; Notable for the following:    Alcohol, Ethyl (B) 279 (*)    All other components within normal limits  TROPONIN I     ____________________________________________   EKG  None  ____________________________________________    RADIOLOGY  None  ____________________________________________   PROCEDURES  Procedures  ____________________________________________   INITIAL IMPRESSION / ASSESSMENT AND PLAN / ED COURSE  Pertinent labs & imaging results that were available during my care of the patient were reviewed by me and considered in my medical decision making (see chart for details).  Patient presents to the emergency department today because of concerns for increasing bilateral upper extremity numbness. Patient states the symptoms have been going on for the past 7 months. He came today because one of his disability worker's yesterday told him to go to the emergency department because he could be having a life-threatening disease. Unfortunately he is unaware of what disease or process she thought was going on. On exam he appears well. Has some subjective numbness to his upper extremities. They are slightly swollen. Pulses are good. Given that this possibly going on for 7 months and blood work is without concerning findings the patient is safe for outpatient follow-up and workup. Unclear what concernes the disability worker had. Will give patient multiple options for primary care follow-up.   ____________________________________________   FINAL CLINICAL IMPRESSION(S) / ED DIAGNOSES  Final diagnoses:  Numbness     Note: This dictation was prepared with Dragon dictation. Any transcriptional errors that result from this process are unintentional    Phineas SemenGraydon Antanisha Mohs, MD 05/22/16 0041

## 2016-05-22 NOTE — Discharge Instructions (Signed)
Please seek medical attention for any high fevers, chest pain, shortness of breath, change in behavior, persistent vomiting, bloody stool or any other new or concerning symptoms.  

## 2017-09-21 ENCOUNTER — Emergency Department
Admission: EM | Admit: 2017-09-21 | Discharge: 2017-09-21 | Disposition: A | Discharge status: Home / Self Care | Payer: Self-pay | Attending: Emergency Medicine | Admitting: Emergency Medicine

## 2017-09-21 ENCOUNTER — Encounter: Payer: Self-pay | Admitting: Emergency Medicine

## 2017-09-21 ENCOUNTER — Other Ambulatory Visit: Payer: Self-pay

## 2017-09-21 DIAGNOSIS — G6289 Other specified polyneuropathies: Secondary | ICD-10-CM | POA: Insufficient documentation

## 2017-09-21 DIAGNOSIS — F17210 Nicotine dependence, cigarettes, uncomplicated: Secondary | ICD-10-CM | POA: Insufficient documentation

## 2017-09-21 LAB — CBC
HEMATOCRIT: 38.7 % — AB (ref 40.0–52.0)
HEMOGLOBIN: 13 g/dL (ref 13.0–18.0)
MCH: 31.9 pg (ref 26.0–34.0)
MCHC: 33.6 g/dL (ref 32.0–36.0)
MCV: 94.9 fL (ref 80.0–100.0)
Platelets: 206 10*3/uL (ref 150–440)
RBC: 4.08 MIL/uL — AB (ref 4.40–5.90)
RDW: 13.9 % (ref 11.5–14.5)
WBC: 8.2 10*3/uL (ref 3.8–10.6)

## 2017-09-21 LAB — COMPREHENSIVE METABOLIC PANEL
ALBUMIN: 4 g/dL (ref 3.5–5.0)
ALK PHOS: 112 U/L (ref 38–126)
ALT: 33 U/L (ref 17–63)
ANION GAP: 10 (ref 5–15)
AST: 54 U/L — ABNORMAL HIGH (ref 15–41)
BILIRUBIN TOTAL: 0.4 mg/dL (ref 0.3–1.2)
BUN: 12 mg/dL (ref 6–20)
CALCIUM: 9.4 mg/dL (ref 8.9–10.3)
CO2: 23 mmol/L (ref 22–32)
Chloride: 107 mmol/L (ref 101–111)
Creatinine, Ser: 0.97 mg/dL (ref 0.61–1.24)
GFR calc Af Amer: >60 mL/min (ref >60)
GLUCOSE: 106 mg/dL — AB (ref 65–99)
POTASSIUM: 4.4 mmol/L (ref 3.5–5.1)
Sodium: 140 mmol/L (ref 135–145)
TOTAL PROTEIN: 8 g/dL (ref 6.5–8.1)

## 2017-09-21 LAB — DIFFERENTIAL
Basophils Absolute: 0 10*3/uL (ref 0–0.1)
Basophils Relative: 1 %
EOS ABS: 0 10*3/uL (ref 0–0.7)
EOS PCT: 0 %
LYMPHS ABS: 1.6 10*3/uL (ref 1.0–3.6)
LYMPHS PCT: 20 %
MONOS PCT: 9 %
Monocytes Absolute: 0.7 10*3/uL (ref 0.2–1.0)
NEUTROS PCT: 70 %
Neutro Abs: 5.8 10*3/uL (ref 1.4–6.5)

## 2017-09-21 LAB — TROPONIN I

## 2017-09-21 MED ORDER — GABAPENTIN 100 MG PO CAPS: 100.0000 mg | ORAL_CAPSULE | Freq: Three times a day (TID) | ORAL | 0 refills | Status: DC

## 2017-09-21 MED ORDER — GABAPENTIN 100 MG PO CAPS
100.0000 mg | ORAL_CAPSULE | Freq: Once | ORAL | Status: AC
  Administered 2017-09-21: 100 mg via ORAL
  Filled 2017-09-21: qty 1

## 2017-09-21 NOTE — ED Triage Notes (Addendum)
FIRST NURSE NOTE-numbness to both hands and feet for months with poor circulation. Ambulatory without difficulty. NAD

## 2017-09-21 NOTE — ED Triage Notes (Signed)
Pt presents with numbness in hands and feet x "months." States he has been afraid to come to hospital or see MD for this. He denies that it is worse when it is cold outside; denies other symptoms. Pt alert & oriented, ambulates without difficulty; nad noted.

## 2017-09-21 NOTE — ED Provider Notes (Signed)
[facility] Emergency Department Provider Note  Time seen: 10:40 AM  I have reviewed the triage vital signs and the nursing notes.   HISTORY  Chief Complaint Numbness    HPI Lance Green is a 41 y.o. male with no past medical history per patient who presents to the emergency department for tingling in his hands and feet.  According to the patient for the past 1 year he has noted intermittent tingling in his hands and feet and occasional color change/darkening to his hands.  Patient states approximately 1 year ago it started in his fingertips and toes and has progressed to involve now his entire hands and feet.  States that has been ongoing and progressive for the past 1 year.  States yesterday he noticed some purple-like discoloration to both of his hands but it resolved on its own.  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  Denies any acute worsening today.  Largely negative review of systems otherwise.   History reviewed. No pertinent past medical history.  There are no active problems to display for this patient.   History reviewed. No pertinent surgical history.  Prior to Admission medications   Medication Sig Start Date End Date Taking? Authorizing Provider  traMADol (ULTRAM) 50 MG tablet Take 1 tablet (50 mg total) by mouth every 6 (six) hours as needed for moderate pain. Patient not taking: Reported on 05/21/2016 06/07/15   Joni ReiningSmith, Ronald K, PA-C    No Known Allergies  History reviewed. No pertinent family history.  Social History Social History   Tobacco Use  . Smoking status: Current Every Day Smoker    Packs/day: 0.50    Types: Cigarettes  . Smokeless tobacco: Never Used  Substance Use Topics  . Alcohol use: Yes    Comment:  daily 40 oz  . Drug use: No    Review of Systems Constitutional: Negative for fever. Cardiovascular: Negative for chest pain. Respiratory: Negative for shortness of breath. Gastrointestinal: Negative for abdominal pain, vomiting and diarrhea. Musculoskeletal: Tingling in hands and feet Neurological: Negative for headache.  Tingling and numbness in hands and feet. All other ROS negative  ____________________________________________   PHYSICAL EXAM:  VITAL SIGNS: ED Triage Vitals  Enc Vitals Group     BP 09/21/17 0905 (!) 155/99     Pulse Rate 09/21/17 0905 79     Resp 09/21/17 0905 18     Temp 09/21/17 0905 99.2 F (37.3 C)     Temp Source 09/21/17 0905 Oral     SpO2 09/21/17 0905 100 %     Weight 09/21/17 0907 150 lb (68 kg)     Height 09/21/17 0907 5\' 8"  (1.727 m)     Head Circumference --      Peak Flow --      Pain Score 09/21/17 0904 0     Pain Loc --      Pain Edu? --      Excl. in GC? --    Constitutional: Alert and oriented. Well appearing and in no distress. Eyes: Normal exam ENT   Head: Normocephalic and atraumatic.   Mouth/Throat: Mucous membranes are moist. Cardiovascular: Normal rate, regular rhythm. No murmur Respiratory: Normal respiratory effort without tachypnea nor retractions. Breath sounds  are clear Gastrointestinal: Soft and nontender. No distention.  Musculoskeletal: Nontender with normal range of motion in all extremities.  2+ radial pulses.  Good capillary refill in hand, 2+ DP pulses. Neurologic:  Normal speech and language.  Patient does state subjective numbness/tingling in his hands and feet during examination.  Strength is 5/5 in all extremities with good and equal grip strengths. Skin:  Skin is warm, dry and intact.  Skin is normal in color. Psychiatric: Mood and affect are normal.   ____________________________________________  INITIAL IMPRESSION / ASSESSMENT AND PLAN / ED COURSE  Pertinent labs & imaging results that were  available during my care of the patient were reviewed by me and considered in my medical decision making (see chart for details).  Patient presents to the emergency department for numbness/tingling in his hands and feet ongoing but progressive over the past 1 year.  Differential would include neuropathy, paresthesias, less likely CVA.  Overall the patient appears very well, no distress lying in bed calm and cooperative.  Patient has a normal physical exam as well as normal neurological exam.  Patient symptoms are very suggestive of neuropathy.  Denies any known history of diabetes but states it does run in his family.  We will check labs and continue to closely monitor in the emergency department.  We will start the patient on Neurontin while awaiting results.  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  Patient's workup is largely within normal limits.  Normal glucose.  We will discharge with gabapentin and neurology follow-up.  Patient agreeable to this plan.  ____________________________________________   FINAL CLINICAL IMPRESSION(S) / ED DIAGNOSES  Peripheral neuropathy    Minna AntisPaduchowski, Netanel Yannuzzi, MD 09/21/17 1142

## 2018-07-06 ENCOUNTER — Observation Stay: Payer: Self-pay | Admitting: Anesthesiology

## 2018-07-06 ENCOUNTER — Other Ambulatory Visit: Payer: Self-pay

## 2018-07-06 ENCOUNTER — Observation Stay
Admission: EM | Admit: 2018-07-06 | Discharge: 2018-07-07 | Disposition: A | Discharge status: Home / Self Care | Payer: Self-pay | Attending: General Surgery | Admitting: General Surgery

## 2018-07-06 ENCOUNTER — Encounter
Admission: EM | Disposition: A | Discharge status: Home / Self Care | Payer: Self-pay | Source: Home / Self Care | Attending: Emergency Medicine

## 2018-07-06 ENCOUNTER — Emergency Department: Payer: Self-pay

## 2018-07-06 ENCOUNTER — Encounter: Payer: Self-pay | Admitting: Emergency Medicine

## 2018-07-06 DIAGNOSIS — K760 Fatty (change of) liver, not elsewhere classified: Secondary | ICD-10-CM | POA: Insufficient documentation

## 2018-07-06 DIAGNOSIS — K388 Other specified diseases of appendix: Principal | ICD-10-CM | POA: Insufficient documentation

## 2018-07-06 DIAGNOSIS — K37 Unspecified appendicitis: Secondary | ICD-10-CM

## 2018-07-06 DIAGNOSIS — F17210 Nicotine dependence, cigarettes, uncomplicated: Secondary | ICD-10-CM | POA: Insufficient documentation

## 2018-07-06 DIAGNOSIS — K353 Acute appendicitis with localized peritonitis, without perforation or gangrene: Secondary | ICD-10-CM | POA: Diagnosis present

## 2018-07-06 DIAGNOSIS — Z8711 Personal history of peptic ulcer disease: Secondary | ICD-10-CM | POA: Insufficient documentation

## 2018-07-06 DIAGNOSIS — Z79899 Other long term (current) drug therapy: Secondary | ICD-10-CM | POA: Insufficient documentation

## 2018-07-06 DIAGNOSIS — G629 Polyneuropathy, unspecified: Secondary | ICD-10-CM | POA: Insufficient documentation

## 2018-07-06 DIAGNOSIS — K861 Other chronic pancreatitis: Secondary | ICD-10-CM | POA: Insufficient documentation

## 2018-07-06 HISTORY — PX: LAPAROSCOPIC APPENDECTOMY: SHX408

## 2018-07-06 LAB — COMPREHENSIVE METABOLIC PANEL
ALK PHOS: 127 U/L — AB (ref 38–126)
ALT: 20 U/L (ref 0–44)
AST: 45 U/L — AB (ref 15–41)
Albumin: 4.8 g/dL (ref 3.5–5.0)
Anion gap: 16 — ABNORMAL HIGH (ref 5–15)
BILIRUBIN TOTAL: 0.8 mg/dL (ref 0.3–1.2)
BUN: 8 mg/dL (ref 6–20)
CALCIUM: 9 mg/dL (ref 8.9–10.3)
CO2: 21 mmol/L — ABNORMAL LOW (ref 22–32)
CREATININE: 0.58 mg/dL — AB (ref 0.61–1.24)
Chloride: 102 mmol/L (ref 98–111)
GFR calc Af Amer: >60 mL/min (ref >60)
GFR calc non Af Amer: >60 mL/min (ref >60)
GLUCOSE: 110 mg/dL — AB (ref 70–99)
POTASSIUM: 3.8 mmol/L (ref 3.5–5.1)
Sodium: 139 mmol/L (ref 135–145)
TOTAL PROTEIN: 8 g/dL (ref 6.5–8.1)

## 2018-07-06 LAB — URINALYSIS, COMPLETE (UACMP) WITH MICROSCOPIC
BACTERIA UA: NONE SEEN
Bilirubin Urine: NEGATIVE
GLUCOSE, UA: NEGATIVE mg/dL
Ketones, ur: 20 mg/dL — AB
Leukocytes, UA: NEGATIVE
NITRITE: NEGATIVE
PROTEIN: 100 mg/dL — AB
Specific Gravity, Urine: 1.013 (ref 1.005–1.030)
Squamous Epithelial / LPF: NONE SEEN (ref 0–5)
pH: 5 (ref 5.0–8.0)

## 2018-07-06 LAB — CBC
HEMATOCRIT: 40.6 % (ref 40.0–52.0)
Hemoglobin: 14.1 g/dL (ref 13.0–18.0)
MCH: 33.1 pg (ref 26.0–34.0)
MCHC: 34.6 g/dL (ref 32.0–36.0)
MCV: 95.6 fL (ref 80.0–100.0)
Platelets: 159 10*3/uL (ref 150–440)
RBC: 4.25 MIL/uL — ABNORMAL LOW (ref 4.40–5.90)
RDW: 12.9 % (ref 11.5–14.5)
WBC: 16.3 10*3/uL — ABNORMAL HIGH (ref 3.8–10.6)

## 2018-07-06 LAB — LIPASE, BLOOD: Lipase: 48 U/L (ref 11–51)

## 2018-07-06 IMAGING — CT CT ABD-PELV W/ CM
2 of 5 series · 16 of 46 positions shown, 18 images · IV contrast (APPLIED)
Comparison: None.

CLINICAL DATA: Generalized abdomen pain with nausea vomiting since
this morning.

EXAM:
CT ABDOMEN AND PELVIS WITH CONTRAST
TECHNIQUE: Multidetector CT imaging of the abdomen and pelvis was performed
using the standard protocol following bolus administration of
intravenous contrast.
CONTRAST:  100mL [9L] IOPAMIDOL ([9L]) INJECTION 61%

[Series 2: routine abd/pel with · axial · 0.64mm/px · z∈[-663,-293]mm · 13 of 84 slices shown, 15 images]
[im 5/84  soft-tissue]
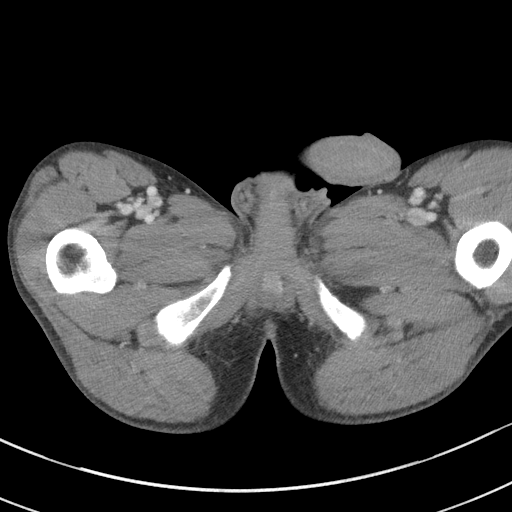
[im 5/84  bone]
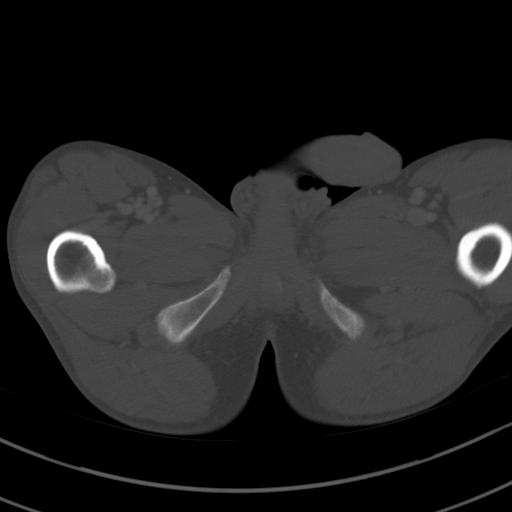
[im 10/84  soft-tissue]
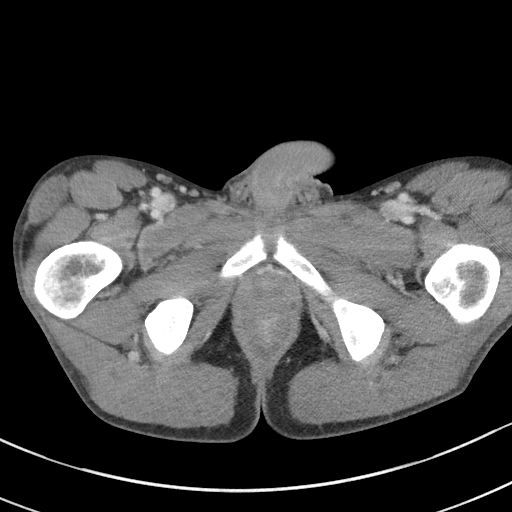
[im 20/84  soft-tissue]
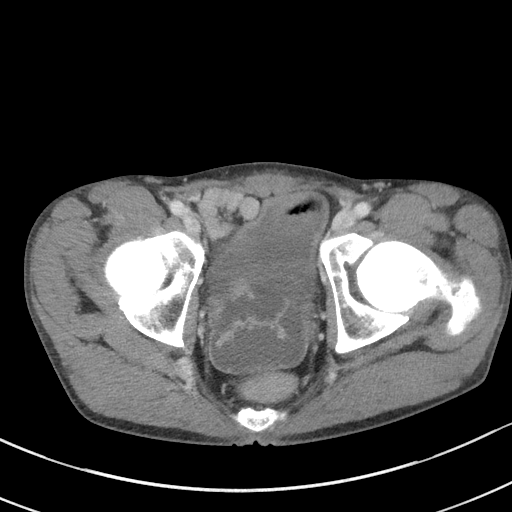
[im 25/84  soft-tissue]
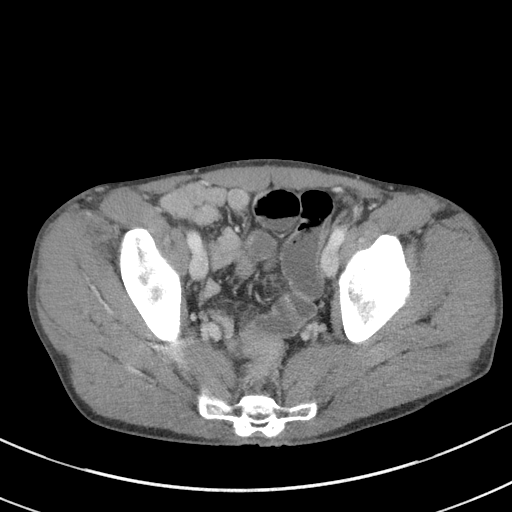
[im 30/84  soft-tissue]
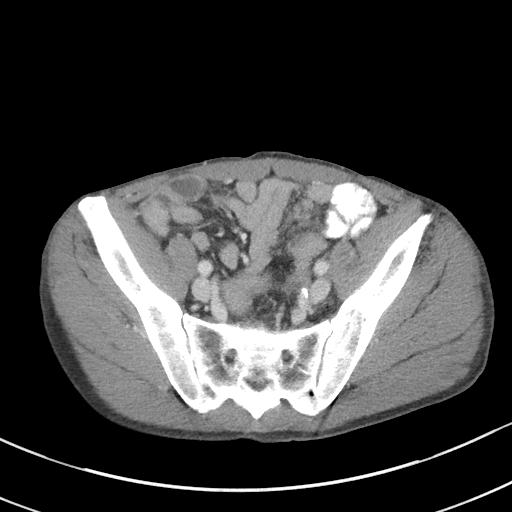
[im 35/84  soft-tissue]
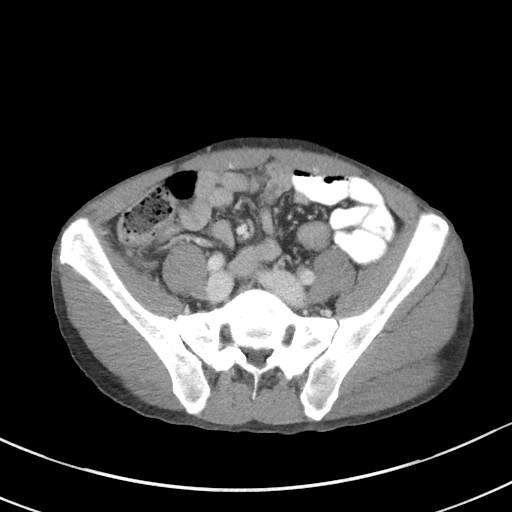
[im 44/84  soft-tissue]
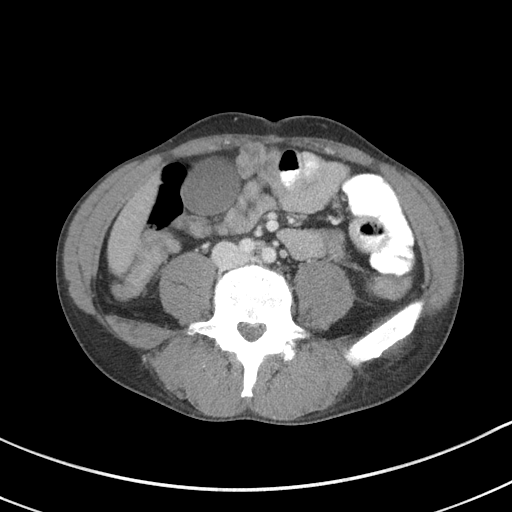
[im 49/84  soft-tissue]
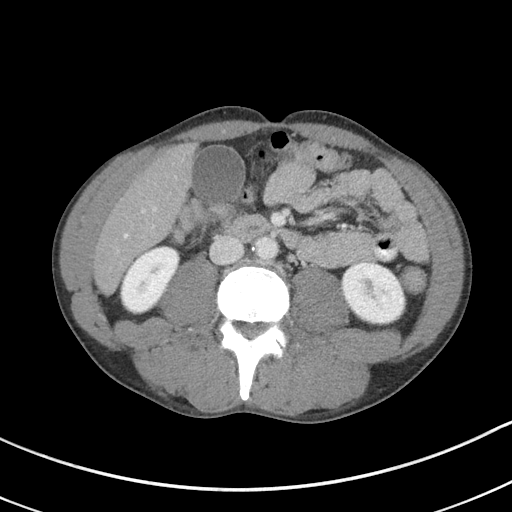
[im 54/84  soft-tissue]
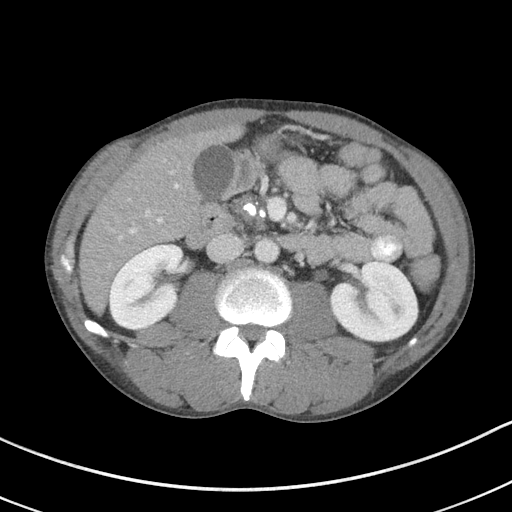
[im 54/84  bone]
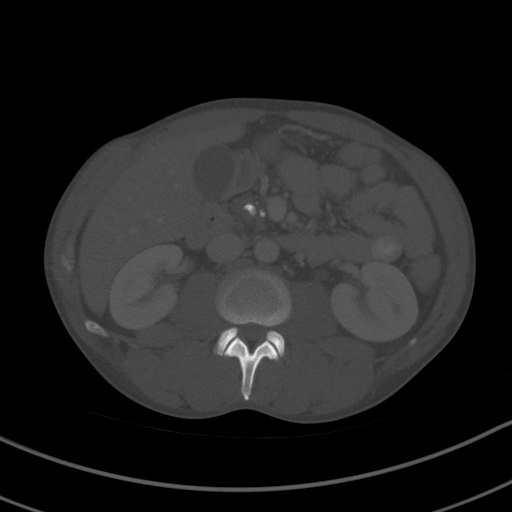
[im 59/84  soft-tissue]
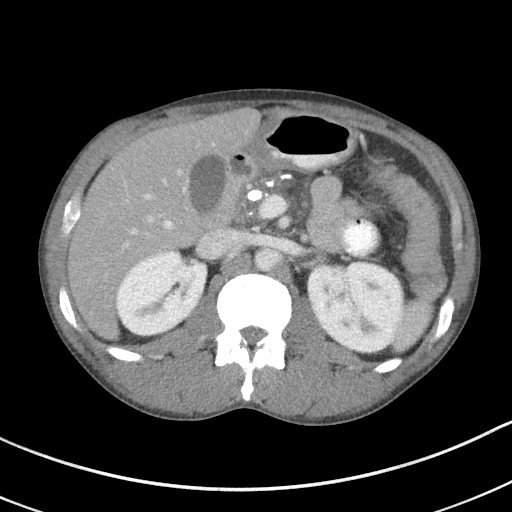
[im 64/84  soft-tissue]
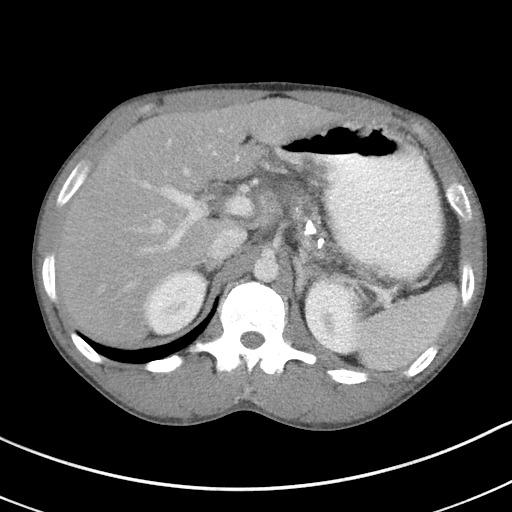
[im 74/84  soft-tissue]
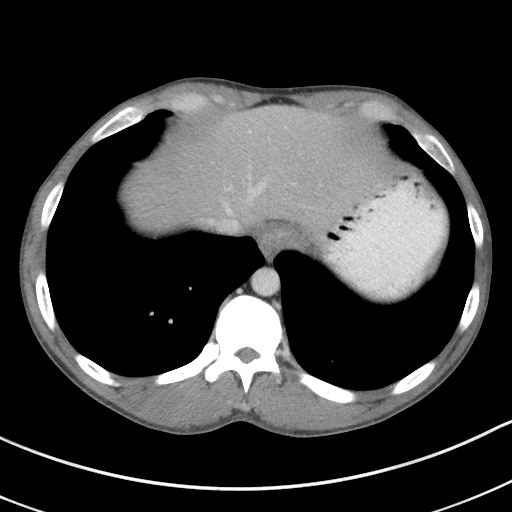
[im 79/84  soft-tissue]
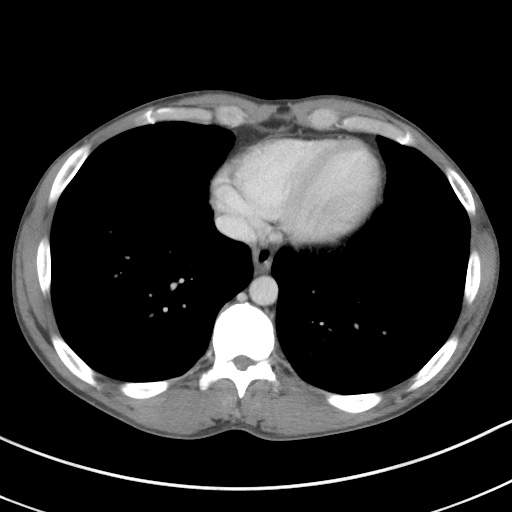

[Series 5: coronal st · coronal · 0.63mm/px · 3 of 68 slices shown]
[im 23/68  soft-tissue]
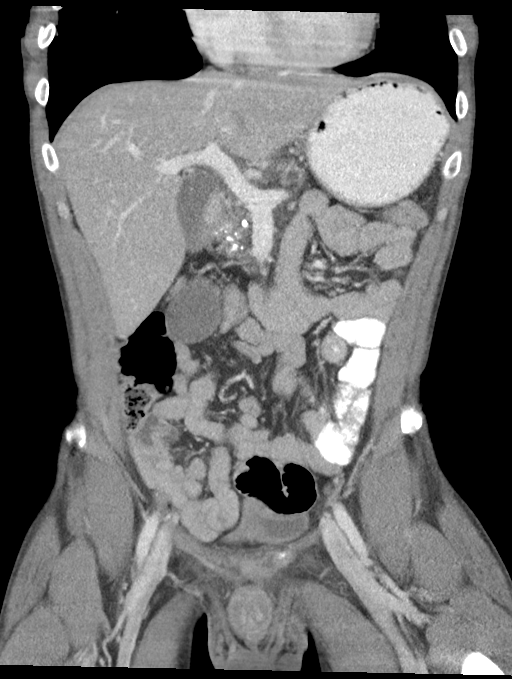
[im 30/68  soft-tissue]
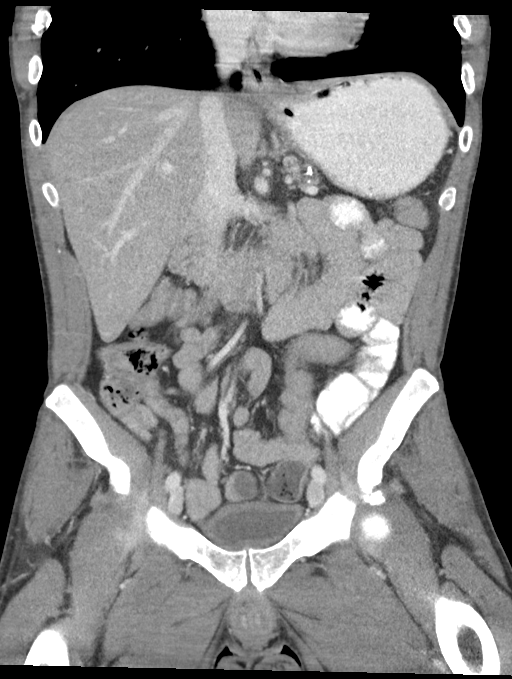
[im 38/68  soft-tissue]
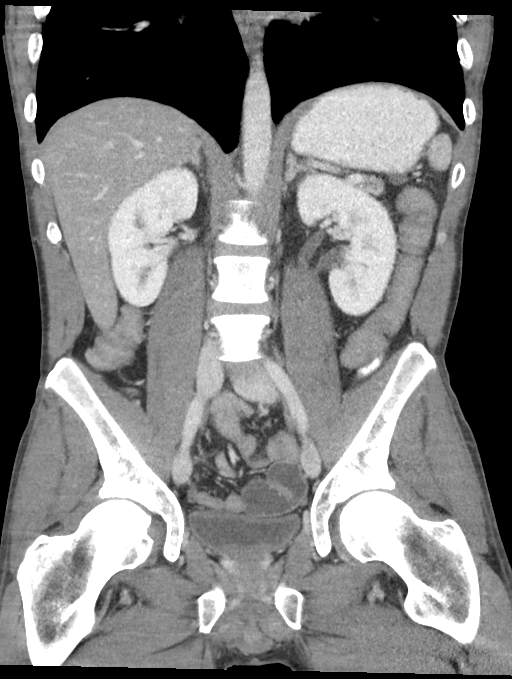

[16 of 46 positions shown; findings below may reference images not displayed]

FINDINGS: Lower chest: No acute abnormality.

Hepatobiliary: Diffuse low density of the liver without vessel
displacement is identified. No focal liver lesion is noted. The
gallbladder is distended. No inflammatory changes noted around
gallbladder. The biliary tree is normal.

Pancreas: Calcifications are identified in the pancreas. There is
pancreatic ductal dilatation with atrophy of pancreas.

Spleen: Normal in size without focal abnormality.

Adrenals/Urinary Tract: Adrenal glands are unremarkable. Kidneys are
normal, without renal calculi, focal lesion, or hydronephrosis.
Bladder is unremarkable.

Stomach/Bowel: There is minimal hiatal hernia. The stomach is
normal. There is no small bowel obstruction. The colon is normal.
There is mild inflammation surrounding the appendix.

Vascular/Lymphatic: Aortic atherosclerosis. No enlarged abdominal or
pelvic lymph nodes.

Reproductive: Prostate is unremarkable.

Other: No abdominal wall hernia or abnormality. No abdominopelvic
ascites.

Musculoskeletal: No acute or significant osseous findings.
IMPRESSION: Mild inflammation surrounding the appendix, suspect early
appendicitis.

Changes of chronic pancreatitis.

Fatty infiltration of liver.

Distended gallbladder.

## 2018-07-06 SURGERY — APPENDECTOMY, LAPAROSCOPIC
Anesthesia: General | Site: Abdomen

## 2018-07-06 MED ORDER — LORAZEPAM 2 MG/ML IJ SOLN: 1.0000 mg | Freq: Four times a day (QID) | INTRAMUSCULAR | Status: DC | PRN

## 2018-07-06 MED ORDER — THIAMINE HCL 100 MG/ML IJ SOLN
100.0000 mg | Freq: Every day | INTRAMUSCULAR | Status: DC
  Filled 2018-07-06: qty 2

## 2018-07-06 MED ORDER — MORPHINE SULFATE (PF) 4 MG/ML IV SOLN
4.0000 mg | Freq: Once | INTRAVENOUS | Status: AC
  Administered 2018-07-06: 4 mg via INTRAVENOUS
  Filled 2018-07-06: qty 1

## 2018-07-06 MED ORDER — FENTANYL CITRATE (PF) 100 MCG/2ML IJ SOLN
INTRAMUSCULAR | Status: AC
  Filled 2018-07-06: qty 2

## 2018-07-06 MED ORDER — ONDANSETRON HCL 4 MG/2ML IJ SOLN
4.0000 mg | Freq: Once | INTRAMUSCULAR | Status: AC
  Administered 2018-07-06: 4 mg via INTRAVENOUS
  Filled 2018-07-06: qty 2

## 2018-07-06 MED ORDER — DEXAMETHASONE SODIUM PHOSPHATE 10 MG/ML IJ SOLN
INTRAMUSCULAR | Status: AC
  Filled 2018-07-06: qty 1

## 2018-07-06 MED ORDER — SODIUM CHLORIDE 0.9 % IV SOLN
INTRAVENOUS | Status: DC
  Administered 2018-07-06: 23:00:00 via INTRAVENOUS

## 2018-07-06 MED ORDER — PHENYLEPHRINE HCL 10 MG/ML IJ SOLN
INTRAMUSCULAR | Status: AC
  Filled 2018-07-06: qty 1

## 2018-07-06 MED ORDER — VITAMIN B-1 100 MG PO TABS
100.0000 mg | ORAL_TABLET | Freq: Every day | ORAL | Status: DC
  Administered 2018-07-07: 100 mg via ORAL
  Filled 2018-07-06: qty 1

## 2018-07-06 MED ORDER — ADULT MULTIVITAMIN W/MINERALS CH
1.0000 | ORAL_TABLET | Freq: Every day | ORAL | Status: DC
  Administered 2018-07-07: 1 via ORAL
  Filled 2018-07-06: qty 1

## 2018-07-06 MED ORDER — BUPIVACAINE-EPINEPHRINE (PF) 0.5% -1:200000 IJ SOLN
INTRAMUSCULAR | Status: AC
  Filled 2018-07-06: qty 30

## 2018-07-06 MED ORDER — MIDAZOLAM HCL 2 MG/2ML IJ SOLN
INTRAMUSCULAR | Status: AC
  Filled 2018-07-06: qty 2

## 2018-07-06 MED ORDER — SUCCINYLCHOLINE CHLORIDE 20 MG/ML IJ SOLN
INTRAMUSCULAR | Status: AC
  Filled 2018-07-06: qty 1

## 2018-07-06 MED ORDER — LACTATED RINGERS IV SOLN
INTRAVENOUS | Status: DC | PRN
  Administered 2018-07-06: 21:00:00 via INTRAVENOUS

## 2018-07-06 MED ORDER — BUPIVACAINE-EPINEPHRINE 0.5% -1:200000 IJ SOLN
INTRAMUSCULAR | Status: DC | PRN
  Administered 2018-07-06: 10 mL

## 2018-07-06 MED ORDER — FENTANYL CITRATE (PF) 100 MCG/2ML IJ SOLN
INTRAMUSCULAR | Status: AC
  Administered 2018-07-06: 25 ug via INTRAVENOUS
  Filled 2018-07-06: qty 2

## 2018-07-06 MED ORDER — HYDROCODONE-ACETAMINOPHEN 7.5-325 MG PO TABS
1.0000 | ORAL_TABLET | Freq: Four times a day (QID) | ORAL | Status: DC | PRN
  Administered 2018-07-07: 1 via ORAL
  Filled 2018-07-06: qty 1

## 2018-07-06 MED ORDER — FENTANYL CITRATE (PF) 100 MCG/2ML IJ SOLN
INTRAMUSCULAR | Status: DC | PRN
  Administered 2018-07-06: 100 ug via INTRAVENOUS
  Administered 2018-07-06 (×2): 50 ug via INTRAVENOUS

## 2018-07-06 MED ORDER — LIDOCAINE HCL (CARDIAC) PF 100 MG/5ML IV SOSY
PREFILLED_SYRINGE | INTRAVENOUS | Status: DC | PRN
  Administered 2018-07-06: 60 mg via INTRAVENOUS

## 2018-07-06 MED ORDER — MIDAZOLAM HCL 2 MG/2ML IJ SOLN
INTRAMUSCULAR | Status: DC | PRN
  Administered 2018-07-06 (×2): 2 mg via INTRAVENOUS

## 2018-07-06 MED ORDER — FOLIC ACID 1 MG PO TABS
1.0000 mg | ORAL_TABLET | Freq: Every day | ORAL | Status: DC
  Administered 2018-07-07: 1 mg via ORAL
  Filled 2018-07-06: qty 1

## 2018-07-06 MED ORDER — FENTANYL CITRATE (PF) 100 MCG/2ML IJ SOLN
25.0000 ug | INTRAMUSCULAR | Status: DC | PRN
  Administered 2018-07-06 (×2): 25 ug via INTRAVENOUS

## 2018-07-06 MED ORDER — OXYCODONE HCL 5 MG/5ML PO SOLN: 5.0000 mg | Freq: Once | ORAL | Status: DC | PRN

## 2018-07-06 MED ORDER — PIPERACILLIN-TAZOBACTAM 3.375 G IVPB
3.3750 g | Freq: Three times a day (TID) | INTRAVENOUS | Status: DC
  Administered 2018-07-06 – 2018-07-07 (×2): 3.375 g via INTRAVENOUS
  Filled 2018-07-06: qty 50

## 2018-07-06 MED ORDER — LIDOCAINE HCL (PF) 2 % IJ SOLN
INTRAMUSCULAR | Status: AC
  Filled 2018-07-06: qty 10

## 2018-07-06 MED ORDER — PROMETHAZINE HCL 25 MG/ML IJ SOLN: 6.2500 mg | INTRAMUSCULAR | Status: DC | PRN

## 2018-07-06 MED ORDER — ONDANSETRON HCL 4 MG/2ML IJ SOLN
INTRAMUSCULAR | Status: DC | PRN
  Administered 2018-07-06: 4 mg via INTRAVENOUS

## 2018-07-06 MED ORDER — SEVOFLURANE IN SOLN
RESPIRATORY_TRACT | Status: AC
  Filled 2018-07-06: qty 250

## 2018-07-06 MED ORDER — SUGAMMADEX SODIUM 200 MG/2ML IV SOLN
INTRAVENOUS | Status: DC | PRN
  Administered 2018-07-06: 150 mg via INTRAVENOUS

## 2018-07-06 MED ORDER — PROPOFOL 10 MG/ML IV BOLUS
INTRAVENOUS | Status: AC
  Filled 2018-07-06: qty 20

## 2018-07-06 MED ORDER — 0.9 % SODIUM CHLORIDE (POUR BTL) OPTIME
TOPICAL | Status: DC | PRN
  Administered 2018-07-06: 50 mL

## 2018-07-06 MED ORDER — MORPHINE SULFATE (PF) 4 MG/ML IV SOLN: 4.0000 mg | INTRAVENOUS | Status: DC | PRN

## 2018-07-06 MED ORDER — PROPOFOL 10 MG/ML IV BOLUS
INTRAVENOUS | Status: DC | PRN
  Administered 2018-07-06: 100 mg via INTRAVENOUS
  Administered 2018-07-06: 200 mg via INTRAVENOUS

## 2018-07-06 MED ORDER — LORAZEPAM 1 MG PO TABS: 1.0000 mg | ORAL_TABLET | Freq: Four times a day (QID) | ORAL | Status: DC | PRN

## 2018-07-06 MED ORDER — ROCURONIUM BROMIDE 100 MG/10ML IV SOLN
INTRAVENOUS | Status: DC | PRN
  Administered 2018-07-06: 30 mg via INTRAVENOUS

## 2018-07-06 MED ORDER — FENTANYL CITRATE (PF) 250 MCG/5ML IJ SOLN
INTRAMUSCULAR | Status: AC
  Filled 2018-07-06: qty 5

## 2018-07-06 MED ORDER — ROCURONIUM BROMIDE 50 MG/5ML IV SOLN
INTRAVENOUS | Status: AC
  Filled 2018-07-06: qty 1

## 2018-07-06 MED ORDER — ENOXAPARIN SODIUM 40 MG/0.4ML ~~LOC~~ SOLN
40.0000 mg | SUBCUTANEOUS | Status: DC
  Administered 2018-07-07: 40 mg via SUBCUTANEOUS
  Filled 2018-07-06: qty 0.4

## 2018-07-06 MED ORDER — ONDANSETRON HCL 4 MG/2ML IJ SOLN
INTRAMUSCULAR | Status: AC
  Filled 2018-07-06: qty 2

## 2018-07-06 MED ORDER — SODIUM CHLORIDE 0.9 % IV SOLN
INTRAVENOUS | Status: AC | PRN
  Administered 2018-07-06: 450 mL via INTRAMUSCULAR

## 2018-07-06 MED ORDER — OXYCODONE HCL 5 MG PO TABS: 5.0000 mg | ORAL_TABLET | Freq: Once | ORAL | Status: DC | PRN

## 2018-07-06 MED ORDER — SUCCINYLCHOLINE CHLORIDE 20 MG/ML IJ SOLN
INTRAMUSCULAR | Status: DC | PRN
  Administered 2018-07-06: 100 mg via INTRAVENOUS

## 2018-07-06 MED ORDER — IOPAMIDOL (ISOVUE-300) INJECTION 61%
100.0000 mL | Freq: Once | INTRAVENOUS | Status: AC | PRN
  Administered 2018-07-06: 100 mL via INTRAVENOUS
  Filled 2018-07-06: qty 100

## 2018-07-06 MED ORDER — IOPAMIDOL (ISOVUE-300) INJECTION 61%
30.0000 mL | Freq: Once | INTRAVENOUS | Status: AC | PRN
  Administered 2018-07-06: 30 mL via ORAL
  Filled 2018-07-06: qty 30

## 2018-07-06 MED ORDER — ACETAMINOPHEN 500 MG PO TABS
1000.0000 mg | ORAL_TABLET | Freq: Four times a day (QID) | ORAL | Status: DC | PRN
  Administered 2018-07-07: 1000 mg via ORAL
  Filled 2018-07-06: qty 2

## 2018-07-06 MED ORDER — PIPERACILLIN-TAZOBACTAM 3.375 G IVPB
INTRAVENOUS | Status: AC
  Filled 2018-07-06: qty 50

## 2018-07-06 SURGICAL SUPPLY — 45 items
APPLIER CLIP LOGIC TI 5 (MISCELLANEOUS) ×3 IMPLANT
BLADE SURG 15 STRL LF DISP TIS (BLADE) ×1 IMPLANT
BLADE SURG 15 STRL SS (BLADE) ×2
BLADE SURG SZ11 CARB STEEL (BLADE) IMPLANT
CANISTER SUCT 1200ML W/VALVE (MISCELLANEOUS) ×3 IMPLANT
CANNULA DILATOR 10 W/SLV (CANNULA) IMPLANT
CANNULA DILATOR 10MM W/SLV (CANNULA)
CUTTER FLEX LINEAR 45M (STAPLE) ×3 IMPLANT
DERMABOND ADVANCED (GAUZE/BANDAGES/DRESSINGS) ×2
DERMABOND ADVANCED .7 DNX12 (GAUZE/BANDAGES/DRESSINGS) ×1 IMPLANT
DRSG TEGADERM 2-3/8X2-3/4 SM (GAUZE/BANDAGES/DRESSINGS) ×3 IMPLANT
ELECT REM PT RETURN 9FT ADLT (ELECTROSURGICAL) ×3
ELECTRODE REM PT RTRN 9FT ADLT (ELECTROSURGICAL) ×1 IMPLANT
GLOVE BIO SURGEON STRL SZ 6.5 (GLOVE) ×6 IMPLANT
GLOVE BIO SURGEONS STRL SZ 6.5 (GLOVE) ×3
GLOVE BIOGEL PI IND STRL 7.5 (GLOVE) ×2 IMPLANT
GLOVE BIOGEL PI INDICATOR 7.5 (GLOVE) ×4
GOWN STRL REUS W/ TWL LRG LVL3 (GOWN DISPOSABLE) ×1 IMPLANT
GOWN STRL REUS W/TWL LRG LVL3 (GOWN DISPOSABLE) ×2
GRASPER SUT TROCAR 14GX15 (MISCELLANEOUS) ×3 IMPLANT
HANDLE YANKAUER SUCT BULB TIP (MISCELLANEOUS) ×3 IMPLANT
IRRIGATION STRYKERFLOW (MISCELLANEOUS) ×1 IMPLANT
IRRIGATOR STRYKERFLOW (MISCELLANEOUS) ×3
IV NS 1000ML (IV SOLUTION) ×2
IV NS 1000ML BAXH (IV SOLUTION) ×1 IMPLANT
IV NS 500ML (IV SOLUTION) ×2
IV NS 500ML BAXH (IV SOLUTION) ×1 IMPLANT
KIT TURNOVER KIT A (KITS) ×3 IMPLANT
LIGASURE LAP MARYLAND 5MM 37CM (ELECTROSURGICAL) IMPLANT
NEEDLE HYPO 22GX1.5 SAFETY (NEEDLE) ×3 IMPLANT
NEEDLE VERESS 14GA 120MM (NEEDLE) ×3 IMPLANT
PACK LAP CHOLECYSTECTOMY (MISCELLANEOUS) ×3 IMPLANT
POUCH ENDO CATCH 10MM SPEC (MISCELLANEOUS) ×3 IMPLANT
RELOAD 45 VASCULAR/THIN (ENDOMECHANICALS) ×6 IMPLANT
RELOAD STAPLE TA45 3.5 REG BLU (ENDOMECHANICALS) ×3 IMPLANT
SCISSORS METZENBAUM CVD 33 (INSTRUMENTS) IMPLANT
SLEEVE ENDOPATH XCEL 5M (ENDOMECHANICALS) IMPLANT
SPONGE GAUZE 2X2 8PLY STER LF (GAUZE/BANDAGES/DRESSINGS) ×1
SPONGE GAUZE 2X2 8PLY STRL LF (GAUZE/BANDAGES/DRESSINGS) ×2 IMPLANT
SUT MNCRL AB 4-0 PS2 18 (SUTURE) ×6 IMPLANT
SUT VICRYL PLUS ABS 0 54 (SUTURE) ×3 IMPLANT
TRAY FOLEY MTR SLVR 16FR STAT (SET/KITS/TRAYS/PACK) ×3 IMPLANT
TROCAR XCEL 12X100 BLDLESS (ENDOMECHANICALS) ×3 IMPLANT
TROCAR XCEL NON-BLD 5MMX100MML (ENDOMECHANICALS) ×6 IMPLANT
TUBING INSUFFLATION (TUBING) ×3 IMPLANT

## 2018-07-06 NOTE — Op Note (Signed)
Preoperative diagnosis: Acute appendicitis.  Postoperative diagnosis: Acute appendicitis  Procedure: Laparoscopic appendectomy.  Anesthesia: GETA  Surgeon: Dr. Hazle Quant, MD  Wound Classification: Contaminated  Indications: Patient is a 42 y.o. male  presented with right lower quadrant pain of one day of duration, elevated WBC. Computed tomography scan and physical examination were consistent with acute appendicitis.   Findings: 1. Acutely inflamed appendix 2. No peri-appendiceal abscess or phlegmon 3. Normal anatomy 4. Appendiceal artery ligated and divided with EndoGIA 5. Adequate hemostasis.   Description of procedure: The patient was placed on the operating table in the supine position. General anesthesia was induced. A time-out was completed verifying correct patient, procedure, site, positioning, and implant(s) and/or special equipment prior to beginning this procedure. A Foley catheter and orogastric tubes were placed. The abdomen was prepped and draped in the usual sterile fashion.  An incision was made in a natural skin line above the umbilicus.   The fascia was elevated and the Veress needle inserted. Proper position was confirmed by aspiration and saline meniscus test. The abdomen was insufflated with carbon dioxide to a pressure of 15 mmHg. A 12-mm trocar was then inserted.   The patient tolerated insufflation well.  The laparoscope was inserted and the abdomen inspected. No injuries from initial trocar placement were noted. Turbid fluid was noted in the right lower quadrant. Under direct visualization, an 5-mm trocar was inserted in the left lower quadrant lateral to the rectus muscle. A 5-mm port was then placed above the symphysis pubis on midline.  Care was taken to avoid injury to the bladder or inferior epigastric vessels. The table was placed in the Trendelenburg position with the right side elevated.  The cecum was gently grasped with an endoscopic graspers and  pulled toward (the left upper quadrant). An atraumatic grasper was then passed through the suprapubic port and omentum was dissected away until the appendix was identified. The appendix was then grasped and elevated. It was noted to be inflamed.  An endoscopic linear cutting stapler was then used to divide and staple the base of the appendix. It was reloaded with a vascular cartridge and the mesoappendix similarly divided.  The appendix was placed in an endoscopic retrieval bag and removed.  The appendiceal stump was then irrigated and hemostasis was assured. Fluid was suctioned and no other pathology was identified.  Secondary trocars were removed under direct vision. No bleeding was noted. The laparoscope was withdrawn and the umbilical trocar removed. The abdomen was allowed to collapse. All trocar sites greater than 5 mm were closed with Vicryl 0. The skin was closed with subcuticular sutures Monocryl 4-0 of and steristrips.  The patient tolerated the procedure well and was taken to the postanesthesia care unit in satisfactory condition.   Specimen: Appendix  Complications: None  Estimated Blood Loss: 5 mL

## 2018-07-06 NOTE — H&P (Signed)
SURGICAL HISTORY AND PHYSICAL NOTE   HISTORY OF PRESENT ILLNESS (HPI):  42 y.o. male presented to [facility] ED for evaluation of abdominal pain. Patient reports started this morning. Pain generalized on the abdomen and the localized more in the lower quadrant. Pain associated with nausea. Pain has not improved with pain medication. Pain aggravated by pressing the abdomen and certain movements. Denies dirrhea, Denies fever or chills.  Surgery is consulted by Dr. Cyril Loosen in this context for evaluation and management of Acute appendicitis.  PAST MEDICAL HISTORY (PMH):  History reviewed. No pertinent past medical history.   PAST SURGICAL HISTORY (PSH):  History reviewed. No pertinent surgical history.   MEDICATIONS:  Prior to Admission medications   Medication Sig Start Date End Date Taking? Authorizing Provider  gabapentin (NEURONTIN) 100 MG capsule Take 1 capsule (100 mg total) by mouth 3 (three) times daily. 09/21/17 10/21/17  Minna Antis, MD  traMADol (ULTRAM) 50 MG tablet Take 1 tablet (50 mg total) by mouth every 6 (six) hours as needed for moderate pain. Patient not taking: Reported on 05/21/2016 06/07/15   Joni Reining, PA-C     ALLERGIES:  No Known Allergies   SOCIAL HISTORY:  Social History   Socioeconomic History  . Marital status: Single    Spouse name: Not on file  . Number of children: Not on file  . Years of education: Not on file  . Highest education level: Not on file  Occupational History  . Not on file  Social Needs  . Financial resource strain: Not on file  . Food insecurity:    Worry: Not on file    Inability: Not on file  . Transportation needs:    Medical: Not on file    Non-medical: Not on file  Tobacco Use  . Smoking status: Current Every Day Smoker    Packs/day: 0.50    Types: Cigarettes  . Smokeless tobacco: Never Used  Substance and Sexual Activity  . Alcohol use: Yes    Comment: daily 40 oz  . Drug use: No  . Sexual activity: Not on file   Lifestyle  . Physical activity:    Days per week: Not on file    Minutes per session: Not on file  . Stress: Not on file  Relationships  . Social connections:    Talks on phone: Not on file    Gets together: Not on file    Attends religious service: Not on file    Active member of club or organization: Not on file    Attends meetings of clubs or organizations: Not on file    Relationship status: Not on file  . Intimate partner violence:    Fear of current or ex partner: Not on file    Emotionally abused: Not on file    Physically abused: Not on file    Forced sexual activity: Not on file  Other Topics Concern  . Not on file  Social History Narrative  . Not on file    The patient currently resides (home / rehab facility / nursing home): Home The patient normally is (ambulatory / bedbound): Ambulatory   FAMILY HISTORY:  No family history on file.   REVIEW OF SYSTEMS:  Constitutional: denies weight loss, fever, chills, or sweats  Eyes: denies any other vision changes, history of eye injury  ENT: denies sore throat, hearing problems  Respiratory: denies shortness of breath, wheezing  Cardiovascular: denies chest pain, palpitations  Gastrointestinal: denies abdominal pain, N/V, or diarrhea/and  bowel function as per HPI Genitourinary: denies burning with urination or urinary frequency Musculoskeletal: denies any other joint pains or cramps  Skin: denies any other rashes or skin discolorations  Neurological: denies any other headache, dizziness, weakness  Psychiatric: denies any other depression, anxiety   All other review of systems were negative   VITAL SIGNS:  Temp:  [97.7 F (36.5 C)] 97.7 F (36.5 C) (10/01 1152) Pulse Rate:  [85] 85 (10/01 1152) Resp:  [18] 18 (10/01 1152) BP: (136)/(85) 136/85 (10/01 1152) SpO2:  [100 %] 100 % (10/01 1152) Weight:  [68 kg] 68 kg (10/01 1153)     Height: 5\' 9"  (175.3 cm) Weight: 68 kg BMI (Calculated): 22.14   INTAKE/OUTPUT:   This shift: No intake/output data recorded.  Last 2 shifts: @IOLAST2SHIFTS @   PHYSICAL EXAM:  Constitutional:  -- Normal body habitus  -- Awake, alert, and oriented x3  Eyes:  -- Pupils equally round and reactive to light  -- No scleral icterus  Ear, nose, and throat:  -- No jugular venous distension  Pulmonary:  -- No crackles  -- Equal breath sounds bilaterally -- Breathing non-labored at rest Cardiovascular:  -- S1, S2 present  -- No pericardial rubs Gastrointestinal:  -- Abdomen soft, nontender, moderate-distended on lower quadrants, no guarding or rebound tenderness -- No abdominal masses appreciated, pulsatile or otherwise  Musculoskeletal and Integumentary:  -- Wounds or skin discoloration: None appreciated -- Extremities: B/L UE and LE FROM, hands and feet warm, no edema  Neurologic:  -- Motor function: intact and symmetric -- Sensation: intact and symmetric   Labs:  CBC Latest Ref Rng & Units 07/06/2018 09/21/2017 05/21/2016  WBC 3.8 - 10.6 K/uL 16.3(H) 8.2 9.3  Hemoglobin 13.0 - 18.0 g/dL 16.1 09.6 04.5  Hematocrit 40.0 - 52.0 % 40.6 38.7(L) 42.9  Platelets 150 - 440 K/uL 159 206 187   CMP Latest Ref Rng & Units 07/06/2018 09/21/2017 05/21/2016  Glucose 70 - 99 mg/dL 409(W) 119(J) 95  BUN 6 - 20 mg/dL 8 12 5(L)  Creatinine 4.78 - 1.24 mg/dL 2.95(A) 2.13 0.86  Sodium 135 - 145 mmol/L 139 140 139  Potassium 3.5 - 5.1 mmol/L 3.8 4.4 3.5  Chloride 98 - 111 mmol/L 102 107 106  CO2 22 - 32 mmol/L 21(L) 23 22  Calcium 8.9 - 10.3 mg/dL 9.0 9.4 9.1  Total Protein 6.5 - 8.1 g/dL 8.0 8.0 7.8  Total Bilirubin 0.3 - 1.2 mg/dL 0.8 0.4 0.5  Alkaline Phos 38 - 126 U/L 127(H) 112 99  AST 15 - 41 U/L 45(H) 54(H) 34  ALT 0 - 44 U/L 20 33 20   Imaging studies:  EXAM: CT ABDOMEN AND PELVIS WITH CONTRAST  TECHNIQUE: Multidetector CT imaging of the abdomen and pelvis was performed using the standard protocol following bolus administration of intravenous  contrast.  CONTRAST:  ISOVUE-300 IOPAMIDOL (ISOVUE-300) INJECTION 61%  COMPARISON:  None.  FINDINGS: Lower chest: No acute abnormality.  Hepatobiliary: Diffuse low density of the liver without vessel displacement is identified. No focal liver lesion is noted. The gallbladder is distended. No inflammatory changes noted around gallbladder. The biliary tree is normal.  Pancreas: Calcifications are identified in the pancreas. There is pancreatic ductal dilatation with atrophy of pancreas.  Spleen: Normal in size without focal abnormality.  Adrenals/Urinary Tract: Adrenal glands are unremarkable. Kidneys are normal, without renal calculi, focal lesion, or hydronephrosis. Bladder is unremarkable.  Stomach/Bowel: There is minimal hiatal hernia. The stomach is normal. There is no small  bowel obstruction. The colon is normal. There is mild inflammation surrounding the appendix.  Vascular/Lymphatic: Aortic atherosclerosis. No enlarged abdominal or pelvic lymph nodes.  Reproductive: Prostate is unremarkable.  Other: No abdominal wall hernia or abnormality. No abdominopelvic ascites.  Musculoskeletal: No acute or significant osseous findings.  IMPRESSION: Mild inflammation surrounding the appendix, suspect early appendicitis.  Changes of chronic pancreatitis.  Fatty infiltration of liver.  Distended gallbladder.   Electronically Signed   By: Sherian Rein M.D.   On: 07/06/2018 16:15   Assessment/Plan:  42 y.o. male with acute appendicitis.  Patient with history, physical exam, images and labs consistent with acute appendicitis. Patient oriented about diagnosis. Oriented about surgical treatment, surgical procedure, benefits and risk. Risks of surgery discussed: injury to bowel, injury to organs, small bowel obstruction, intra abdominal abscess, bleeding, infection, among others.  Patient understood and agreed to proceed with surgery.   Gae Gallop, MD

## 2018-07-06 NOTE — ED Notes (Signed)
Pt ambulatory to bathroom with one assist.

## 2018-07-06 NOTE — Anesthesia Procedure Notes (Signed)
Date/Time: 07/06/2018 9:11 PM Performed by: Jovita Gamma, MD

## 2018-07-06 NOTE — ED Triage Notes (Signed)
PT arrived with multiple complaints. Pt states he has has tingling/numbness in his hands for the last years. States he came today but "it was worse." Pt also states he has ulcers and pancreatitis and feels they are both "bad." PT denies cheat pain or shortness of breath

## 2018-07-06 NOTE — ED Notes (Signed)
Report to [facility]. Pt awaiting OR. Consent signed.

## 2018-07-06 NOTE — ED Notes (Signed)
[facility]ly came to take pt to the OR. Zosyn and tubing sent with pt. VSS. NAD. Family with pt.

## 2018-07-06 NOTE — Transfer of Care (Signed)
Immediate Anesthesia Transfer of Care Note  Patient: Lance Green  Procedure(s) Performed: APPENDECTOMY LAPAROSCOPIC (N/A Abdomen)  Patient Location: PACU  Anesthesia Type:General  Level of Consciousness: awake, alert  and patient cooperative  Airway & Oxygen Therapy: Patient Spontanous Breathing and Patient connected to face mask oxygen  Post-op Assessment: Report given to RN and Post -op Vital signs reviewed and stable  Post vital signs: Reviewed and stable  Last Vitals:  Vitals Value Taken Time  BP 166/95 07/06/2018 10:08 PM  Temp    Pulse 93 07/06/2018 10:09 PM  Resp 17 07/06/2018 10:09 PM  SpO2 100 % 07/06/2018 10:09 PM  Vitals shown include unvalidated device data.  Last Pain:  Vitals:   07/06/18 1956  TempSrc:   PainSc: 3          Complications: No apparent anesthesia complications

## 2018-07-06 NOTE — ED Notes (Signed)
First Nurse Note: Patient to ED via [facility] with complaint of fingers and toes tingling with numbness X 1 year and abdominal pain.  VSS T-97.7, Glucose 98, 100 oxygen saturation, HR 75.  Alert and oriented.

## 2018-07-06 NOTE — Anesthesia Post-op Follow-up Note (Signed)
Anesthesia QCDR form completed.        

## 2018-07-06 NOTE — ED Provider Notes (Signed)
[facility] Emergency Department Provider Note   ____________________________________________    I have reviewed the triage vital signs and the nursing notes.   HISTORY  Chief Complaint Abdominal Pain and Tingling     HPI Lance Green is a 42 y.o. male who presents with complaints of abdominal pain.  Patient reports he woke up this morning and had significant pain in his epigastrium.  He is suspicious this is related to stomach ulcers also reports a history of pancreatitis.  He has not taken anything for this.  He reports nausea with one episode of vomiting.  No diarrhea.  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  Denies fevers or chills.  No recent travel.   History reviewed. No pertinent past medical history.  There are no active problems to display for this patient.   History reviewed. No pertinent surgical history.  Prior to Admission medications   Medication Sig Start Date End Date Taking? Authorizing Provider  gabapentin (NEURONTIN) 100 MG capsule Take 1 capsule (100 mg total) by mouth 3 (three) times daily. 09/21/17 10/21/17  Minna Antis, MD  traMADol (ULTRAM) 50 MG tablet Take 1 tablet (50 mg total) by mouth every 6 (six) hours as needed for moderate pain. Patient not taking: Reported on 05/21/2016 06/07/15   Joni Reining, PA-C     Allergies Patient has no known allergies.  No family history on file.  Social History Social History   Tobacco Use  . Smoking status: Current Every Day Smoker    Packs/day: 0.50    Types: Cigarettes  . Smokeless tobacco: Never Used  Substance Use Topics  . Alcohol use: Yes    Comment: daily 40 oz  . Drug use: No    Review of Systems  Constitutional: No fever/chills Eyes: No visual changes.  ENT: No sore throat. Cardiovascular: Denies chest pain. Respiratory: Denies shortness of  breath. Gastrointestinal: As above Genitourinary: Negative for dysuria. Musculoskeletal: Negative for back pain. Skin: Negative for rash. Neurological: Negative for headaches, neuropathy as above   ____________________________________________   PHYSICAL EXAM:  VITAL SIGNS: ED Triage Vitals  Enc Vitals Group     BP 07/06/18 1152 136/85     Pulse Rate 07/06/18 1152 85     Resp 07/06/18 1152 18     Temp 07/06/18 1152 97.7 F (36.5 C)     Temp Source 07/06/18 1152 Oral     SpO2 07/06/18 1152 100 %     Weight 07/06/18 1153 68 kg (150 lb)     Height 07/06/18 1153 1.753 m (5\' 9" )     Head Circumference --      Peak Flow --      Pain Score 07/06/18 1152 8     Pain Loc --      Pain Edu? --      Excl. in GC? --     Constitutional: Alert and oriented. No acute distress. Pleasant and interactive Eyes: Conjunctivae are normal.   Nose: No congestion/rhinnorhea. Mouth/Throat: Mucous membranes are moist.    Cardiovascular: Normal rate, regular rhythm. Grossly normal heart sounds.  Good peripheral circulation. Respiratory: Normal respiratory effort.  No retractions. Lungs CTAB. Gastrointestinal: Tenderness palpation epigastrium primarily, some distention  Musculoskeletal:.  Warm and well perfused Neurologic:  Normal speech and language. No gross focal neurologic deficits are appreciated.  Skin:  Skin is warm, dry and intact. No  rash noted. Psychiatric: Mood and affect are normal. Speech and behavior are normal.  ____________________________________________   LABS (all labs ordered are listed, but only abnormal results are displayed)  Labs Reviewed  COMPREHENSIVE METABOLIC PANEL - Abnormal; Notable for the following components:      Result Value   CO2 21 (*)    Glucose, Bld 110 (*)    Creatinine, Ser 0.58 (*)    AST 45 (*)    Alkaline Phosphatase 127 (*)    Anion gap 16 (*)    All other components within normal limits  CBC - Abnormal; Notable for the following components:    WBC 16.3 (*)    RBC 4.25 (*)    All other components within normal limits  URINALYSIS, COMPLETE (UACMP) WITH MICROSCOPIC - Abnormal; Notable for the following components:   Color, Urine YELLOW (*)    APPearance CLEAR (*)    Hgb urine dipstick SMALL (*)    Ketones, ur 20 (*)    Protein, ur 100 (*)    All other components within normal limits  LIPASE, BLOOD   ____________________________________________  EKG  None ____________________________________________  RADIOLOGY  CT abdomen pelvis concerning for acute appendicitis ____________________________________________   PROCEDURES  Procedure(s) performed: No  Procedures   Critical Care performed: No ____________________________________________   INITIAL IMPRESSION / ASSESSMENT AND PLAN / ED COURSE  Pertinent labs & imaging results that were available during my care of the patient were reviewed by me and considered in my medical decision making (see chart for details).  Patient presents with primary complaint of epigastric pain, lab work significant for elevated white blood cell count however lipase is normal.  Differential includes gastritis, PUD, pancreatitis, cholecystitis, colitis.  Will give IV morphine, IV Zofran obtain CT of the pelvis and re-evaluate  CT scan is concerning for acute appendicitis.  I discussed with Dr. Maia Plan of surgery who will see the patient     ____________________________________________   FINAL CLINICAL IMPRESSION(S) / ED DIAGNOSES  Final diagnoses:  Appendicitis, unspecified appendicitis type        Note:  This document was prepared using Dragon voice recognition software and may include unintentional dictation errors.    Jene Every, MD 07/06/18 1640

## 2018-07-06 NOTE — Anesthesia Preprocedure Evaluation (Signed)
Anesthesia Evaluation  Patient identified by MRN, date of birth, ID band Patient awake    Reviewed: Allergy & Precautions, H&P , NPO status , Patient's Chart, lab work & pertinent test results  Airway Mallampati: III  TM Distance: >3 FB Neck ROM: full    Dental  (+) Teeth Intact, Poor Dentition   Pulmonary neg COPD, Current Smoker,    breath sounds clear to auscultation       Cardiovascular (-) CAD and (-) Cardiac Stents negative cardio ROS  (-) dysrhythmias (-) pacemaker(-) Valvular Problems/Murmurs Rhythm:regular Rate:Normal     Neuro/Psych Seizures - (alcohol withdrawal),  H/o ETOH abuse  Neuromuscular disease (peripheral neuropathy) negative neurological ROS  negative psych ROS   GI/Hepatic Neg liver ROS, PUD, H/o alcoholic pancreatitis   Endo/Other  negative endocrine ROS  Renal/GU negative Renal ROS  negative genitourinary   Musculoskeletal   Abdominal   Peds  Hematology negative hematology ROS (+)   Anesthesia Other Findings History reviewed. No pertinent past medical history.  History reviewed. No pertinent surgical history.  BMI    Body Mass Index:  22.15 kg/m      Reproductive/Obstetrics negative OB ROS                             Anesthesia Physical Anesthesia Plan  ASA: III  Anesthesia Plan: General   Post-op Pain Management:    Induction:   PONV Risk Score and Plan: Propofol infusion and TIVA  Airway Management Planned:   Additional Equipment:   Intra-op Plan:   Post-operative Plan:   Informed Consent: I have reviewed the patients History and Physical, chart, labs and discussed the procedure including the risks, benefits and alternatives for the proposed anesthesia with the patient or authorized representative who has indicated his/her understanding and acceptance.   Dental Advisory Given  Plan Discussed with: Anesthesiologist, CRNA and  Surgeon  Anesthesia Plan Comments:         Anesthesia Quick Evaluation

## 2018-07-06 NOTE — Anesthesia Procedure Notes (Signed)
Procedure Name: Intubation Date/Time: 07/06/2018 8:51 PM Performed by: Durenda Hurt, MD Pre-anesthesia Checklist: Patient identified, Patient being monitored, Timeout performed, Emergency Drugs available and Suction available Patient Re-evaluated:Patient Re-evaluated prior to induction Oxygen Delivery Method: Circle system utilized Preoxygenation: Pre-oxygenation with 100% oxygen Induction Type: IV induction, Rapid sequence and Cricoid Pressure applied Laryngoscope Size: Mac and 3 Grade View: Grade III Tube type: Oral Tube size: 7.0 mm Number of attempts: 2 Airway Equipment and Method: Stylet Placement Confirmation: ETT inserted through vocal cords under direct vision,  positive ETCO2 and breath sounds checked- equal and bilateral Secured at: 21 cm Tube secured with: Tape Dental Injury: Teeth and Oropharynx as per pre-operative assessment  Difficulty Due To: Difficulty was unanticipated and Difficult Airway- due to anterior larynx

## 2018-07-07 ENCOUNTER — Encounter: Payer: Self-pay | Admitting: General Surgery

## 2018-07-07 MED ORDER — CIPROFLOXACIN HCL 500 MG PO TABS: 500.0000 mg | ORAL_TABLET | Freq: Two times a day (BID) | ORAL | 0 refills | Status: AC

## 2018-07-07 MED ORDER — METRONIDAZOLE 500 MG PO TABS: 500.0000 mg | ORAL_TABLET | Freq: Three times a day (TID) | ORAL | 0 refills | Status: AC

## 2018-07-07 MED ORDER — HYDROCODONE-ACETAMINOPHEN 7.5-325 MG PO TABS: 1.0000 | ORAL_TABLET | Freq: Four times a day (QID) | ORAL | 0 refills | Status: DC | PRN

## 2018-07-07 NOTE — Discharge Summary (Signed)
SURGICAL PROGRESS NOTE   Hospital Day(s): 0.   Post op day(s): 1 Day Post-Op.   Interval History: Patient seen and examined, no acute events or new complaints overnight. Patient reports having mild soreness. Patient able to [facility], denies nausea or vomiting. Tolerating diet.   Vital signs in last 24 hours: [min-max] current  Temp:  [97.7 F (36.5 C)-98.5 F (36.9 C)] 97.9 F (36.6 C) (10/02 0426) Pulse Rate:  [75-93] 82 (10/02 0426) Resp:  [13-20] 20 (10/02 0426) BP: (129-169)/(78-99) 159/95 (10/02 0426) SpO2:  [96 %-100 %] 100 % (10/02 0426) Weight:  [68 kg] 68 kg (10/01 1153)     Height: 5\' 9"  (175.3 cm) Weight: 68 kg BMI (Calculated): 22.14    Physical Exam:  Constitutional: alert, cooperative and no distress  Respiratory: breathing non-labored at rest  Cardiovascular: regular rate and sinus rhythm  Gastrointestinal: soft, non-tender, and non-distended. Wounds dry and clean.   Labs:  CBC Latest Ref Rng & Units 07/06/2018 09/21/2017 05/21/2016  WBC 3.8 - 10.6 K/uL 16.3(H) 8.2 9.3  Hemoglobin 13.0 - 18.0 g/dL 16.1 09.6 04.5  Hematocrit 40.0 - 52.0 % 40.6 38.7(L) 42.9  Platelets 150 - 440 K/uL 159 206 187   CMP Latest Ref Rng & Units 07/06/2018 09/21/2017 05/21/2016  Glucose 70 - 99 mg/dL 409(W) 119(J) 95  BUN 6 - 20 mg/dL 8 12 5(L)  Creatinine 4.78 - 1.24 mg/dL 2.95(A) 2.13 0.86  Sodium 135 - 145 mmol/L 139 140 139  Potassium 3.5 - 5.1 mmol/L 3.8 4.4 3.5  Chloride 98 - 111 mmol/L 102 107 106  CO2 22 - 32 mmol/L 21(L) 23 22  Calcium 8.9 - 10.3 mg/dL 9.0 9.4 9.1  Total Protein 6.5 - 8.1 g/dL 8.0 8.0 7.8  Total Bilirubin 0.3 - 1.2 mg/dL 0.8 0.4 0.5  Alkaline Phos 38 - 126 U/L 127(H) 112 99  AST 15 - 41 U/L 45(H) 54(H) 34  ALT 0 - 44 U/L 20 33 20    Imaging studies: No new pertinent imaging studies   Assessment/Plan:  42 y.o. male with acute appendicitis 1 Day Post-Op s/p lap appendectomy. Patient tolerated procedure well. Today tolerating diet, ambulating, pain  controlled. Patient oriented about post op care. Discharge medications prescribed for pain control. Will follow at the clinic in 2 weeks.   Allergies as of 07/07/2018   No Known Allergies     Medication List    TAKE these medications   ciprofloxacin 500 MG tablet Commonly known as:  CIPRO Take 1 tablet (500 mg total) by mouth 2 (two) times daily for 5 days.   HYDROcodone-acetaminophen 7.5-325 MG tablet Commonly known as:  NORCO Take 1 tablet by mouth every 6 (six) hours as needed for moderate pain.   metroNIDAZOLE 500 MG tablet Commonly known as:  FLAGYL Take 1 tablet (500 mg total) by mouth 3 (three) times daily for 5 days.        Gae Gallop, MD

## 2018-07-07 NOTE — Progress Notes (Signed)
Patient axox4, vital signs stable. Discharge instructions given to patient and  Family, verbalized understandin.  Pt refused use of wheelchair x3, ambulated to entrance. No acute distress

## 2018-07-07 NOTE — Anesthesia Postprocedure Evaluation (Signed)
Anesthesia Post Note  Patient: Lance Green  Procedure(s) Performed: APPENDECTOMY LAPAROSCOPIC (N/A Abdomen)  Patient location during evaluation: PACU Anesthesia Type: General Level of consciousness: awake and alert Pain management: pain level controlled Vital Signs Assessment: post-procedure vital signs reviewed and stable Respiratory status: spontaneous breathing, nonlabored ventilation, respiratory function stable and patient connected to nasal cannula oxygen Cardiovascular status: blood pressure returned to baseline and stable Postop Assessment: no apparent nausea or vomiting Anesthetic complications: no     Last Vitals:  Vitals:   07/06/18 2253 07/06/18 2316  BP: (!) 160/98 (!) 156/95  Pulse: 85 88  Resp: 13 18  Temp:  36.7 C  SpO2: 96% 100%    Last Pain:  Vitals:   07/06/18 2316  TempSrc: Oral  PainSc:                  Jovita Gamma

## 2018-07-07 NOTE — Discharge Instructions (Signed)
Laparoscopic Appendectomy, Adult, Care After These instructions give you information about caring for yourself after your procedure. Your doctor may also give you more specific instructions. Call your doctor if you have any problems or questions after your procedure. Follow these instructions at home: Medicines  Take over-the-counter and prescription medicines only as told by your doctor.  Do not drive for 24 hours if you received a sedative.  Do not drive or use heavy machinery while taking prescription pain medicine.  If you were prescribed an antibiotic medicine, take it as told by your doctor. Do not stop taking it even if you start to feel better. Activity  Do not lift anything that is heavier than 10 pounds (4.5 kg) for 3 weeks or as told by your doctor.  Do not play contact sports for 3 weeks or as told by your doctor.  Slowly return to your normal activities. Bathing  Keep your cuts from surgery (incisions) clean and dry. ? Gently wash the cuts with soap and water. ? Rinse the cuts with water until the soap is gone. ? Pat the cuts dry with a clean towel. Do not rub the cuts.  You may take showers after 48 hours.  Do not take baths, swim, or use a hot tub for 2 weeks or as told by your doctor. Cut Care  Follow instructions from your doctor about how to take care of your cuts. Make sure you: ? Wash your hands with soap and water before you change your bandage (dressing). If you do not have soap and water, use hand sanitizer. ? Change your bandage as told by your doctor. ? Leave stitches (sutures), skin glue, or skin tape (adhesive) strips in place. They may need to stay in place for 2 weeks or longer. If tape strips get loose and curl up, you may trim the loose edges. Do not remove tape strips completely unless your doctor says it is okay.  Check your cuts every day for signs of infection. Check for: ? More redness, swelling, or pain. ? More fluid or  blood. ? Warmth. ? Pus or a bad smell. Other Instructions  If you were sent home with a drain, follow instructions from your doctor about how to use it and care for it.  Take deep breaths. This helps to keep your lungs from getting swollen (inflamed).  To help with constipation: ? Drink plenty of fluids. ? Eat plenty of fruits and vegetables.  Keep all follow-up visits as told by your doctor. This is important. Contact a doctor if:  You have more redness, swelling, or pain around a cut from surgery.  You have more fluid or blood coming from a cut.  Your cut feels warm to the touch.  You have pus or a bad smell coming from a cut or a bandage.  The edges of a cut break open after the stitches have been taken out.  You have pain in your shoulders that gets worse.  You feel dizzy or you pass out (faint).  You have shortness of breath.  You keep feeling sick to your stomach (nauseous).  You keep throwing up (vomiting).  You get diarrhea or you cannot control your poop.  You lose your appetite.  You have swelling or pain in your legs. Get help right away if:  You have a fever.  You get a rash.  You have trouble breathing.  You have sharp pains in your chest. This information is not intended to replace advice  given to you by your health care provider. Make sure you discuss any questions you have with your health care provider. Document Released: 07/19/2009 Document Revised: 02/28/2016 Document Reviewed: 03/12/2015 Elsevier Interactive Patient Education  2018 [employer].  Diet: Resume home heart healthy regular diet.   Activity: No heavy lifting >20 pounds (children, pets, laundry, garbage) or strenuous activity until follow-up, but light activity and walking are encouraged. Do not drive or drink alcohol if taking narcotic pain medications.  Wound care: May shower with soapy water and pat dry (do not rub incisions), but no baths or submerging incision underwater  until follow-up. (no swimming)   Medications: Resume all home medications. For mild to moderate pain: acetaminophen (Tylenol) or ibuprofen (if no kidney disease). Combining Tylenol with alcohol can substantially increase your risk of causing liver disease. Narcotic pain medications, if prescribed, can be used for severe pain, though may cause nausea, constipation, and drowsiness. Do not combine Tylenol and Norco within a 6 hour period as Norco contains Tylenol. If you do not need the narcotic pain medication, you do not need to fill the prescription.  Take antibiotic as prescribed. Do not combine alcohol with flagyl or acetaminophen.   Call office [phone]) at any time if any questions, worsening pain, fevers/chills, bleeding, drainage from incision site, or other concerns.

## 2018-07-07 NOTE — Care Management Note (Signed)
Case Management Note  Patient Details  Name: Lance Green MRN: [id number] Date of Birth: 05/18/1976   Patient to discharge today.  S/p Laparoscopic appendectomy.  Patient states that he lives independently in a boarding home.  Patient states that he does not have health insurance, however he is in the process of applying for medicaid. Patient's mother at the bedside.  Patient states that he normally utlizes the bus for transportation.  Mother confirms that she can also provide transportation.  RX for antibiotics faxed to [facility] .  Patient to pick up at no cost after discharge.  Patient provided with application to [facility] , [facility] , "The Network:  Your Guide to [employer] in [facility]"  Booklet.  RNCM signing off.   Subjective/Objective:                    Action/Plan:   Expected Discharge Date:  07/07/18               Expected Discharge Plan:  Home/Self Care  In-House Referral:     Discharge planning Services  CM Consult, Indigent Health Clinic, Medication Assistance  Post Acute Care Choice:    Choice offered to:     DME Arranged:    DME Agency:     HH Arranged:    HH Agency:     Status of Service:  Completed, signed off  If discussed at [employer], dates discussed:    Additional Comments:  Chapman Fitch, RN 07/07/2018, 10:35 AM

## 2018-07-08 LAB — HIV ANTIBODY (ROUTINE TESTING W REFLEX): HIV Screen 4th Generation wRfx: NONREACTIVE

## 2018-07-09 LAB — SURGICAL PATHOLOGY

## 2018-08-07 ENCOUNTER — Emergency Department
Admission: EM | Admit: 2018-08-07 | Discharge: 2018-08-07 | Disposition: A | Discharge status: Home / Self Care | Payer: Self-pay | Attending: Emergency Medicine | Admitting: Emergency Medicine

## 2018-08-07 ENCOUNTER — Emergency Department: Payer: Self-pay

## 2018-08-07 ENCOUNTER — Encounter: Payer: Self-pay | Admitting: *Deleted

## 2018-08-07 ENCOUNTER — Other Ambulatory Visit: Payer: Self-pay

## 2018-08-07 DIAGNOSIS — R200 Anesthesia of skin: Secondary | ICD-10-CM | POA: Insufficient documentation

## 2018-08-07 DIAGNOSIS — R202 Paresthesia of skin: Secondary | ICD-10-CM

## 2018-08-07 DIAGNOSIS — F17210 Nicotine dependence, cigarettes, uncomplicated: Secondary | ICD-10-CM | POA: Insufficient documentation

## 2018-08-07 LAB — CBC
HEMATOCRIT: 47.5 % (ref 39.0–52.0)
Hemoglobin: 16 g/dL (ref 13.0–17.0)
MCH: 31.3 pg (ref 26.0–34.0)
MCHC: 33.7 g/dL (ref 30.0–36.0)
MCV: 93 fL (ref 80.0–100.0)
NRBC: 0 % (ref 0.0–0.2)
Platelets: 186 10*3/uL (ref 150–400)
RBC: 5.11 MIL/uL (ref 4.22–5.81)
RDW: 12.7 % (ref 11.5–15.5)
WBC: 5 10*3/uL (ref 4.0–10.5)

## 2018-08-07 LAB — MAGNESIUM: Magnesium: 1.7 mg/dL (ref 1.7–2.4)

## 2018-08-07 LAB — BASIC METABOLIC PANEL
ANION GAP: 14 (ref 5–15)
BUN: <5 mg/dL — ABNORMAL LOW (ref 6–20)
CALCIUM: 8.9 mg/dL (ref 8.9–10.3)
CO2: 23 mmol/L (ref 22–32)
CREATININE: 0.79 mg/dL (ref 0.61–1.24)
Chloride: 105 mmol/L (ref 98–111)
GFR calc Af Amer: >60 mL/min (ref >60)
GFR calc non Af Amer: >60 mL/min (ref >60)
Glucose, Bld: 99 mg/dL (ref 70–99)
POTASSIUM: 4.8 mmol/L (ref 3.5–5.1)
Sodium: 142 mmol/L (ref 135–145)

## 2018-08-07 LAB — VITAMIN B12: Vitamin B-12: 532 pg/mL (ref 180–914)

## 2018-08-07 IMAGING — CT CT HEAD W/O CM
3 series · 15 of 47 positions shown, 18 images · non-contrast
Comparison: None.

CLINICAL DATA: Per EMS report, patient called EMS due to having
weakness and numbness in bilateral hands and feet for one year.
Patient has ETOH on board. Patient was incontinent of urine on pants
and socks. Patient had his appendix out and bl...*comment was
truncated*Paresthesia, facial paresthesias, bilateral arms, hands,
feet

EXAM:
CT HEAD WITHOUT CONTRAST
TECHNIQUE: Contiguous axial images were obtained from the base of the skull
through the vertex without intravenous contrast.

[Series 2: head wo · axial · 0.47mm/px · z∈[-144,-19]mm · 9 of 30 slices shown, 12 images]
[im 3/30  brain]
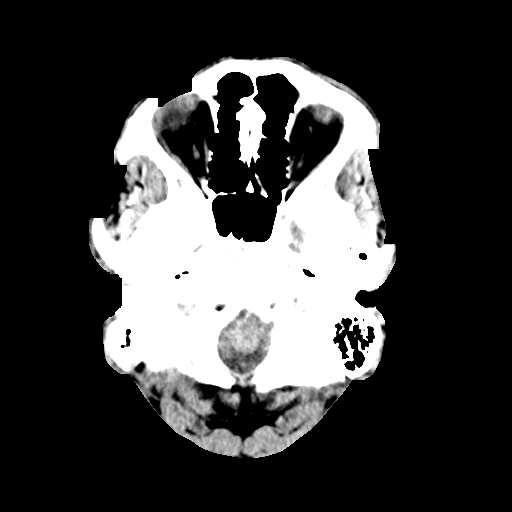
[im 3/30  bone]
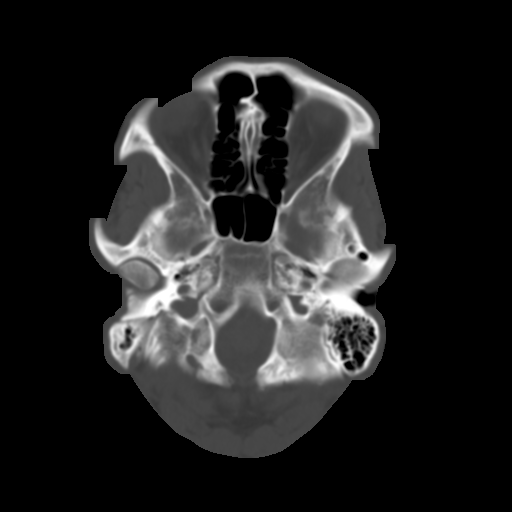
[im 6/30  brain]
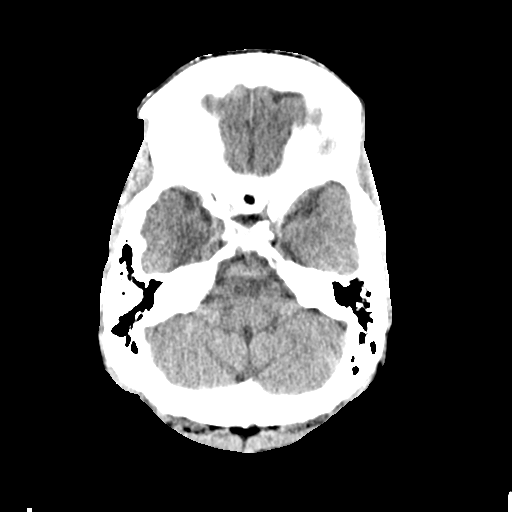
[im 9/30  brain]
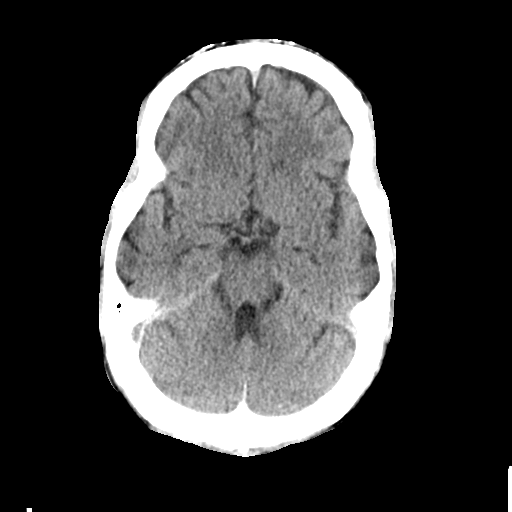
[im 12/30  brain]
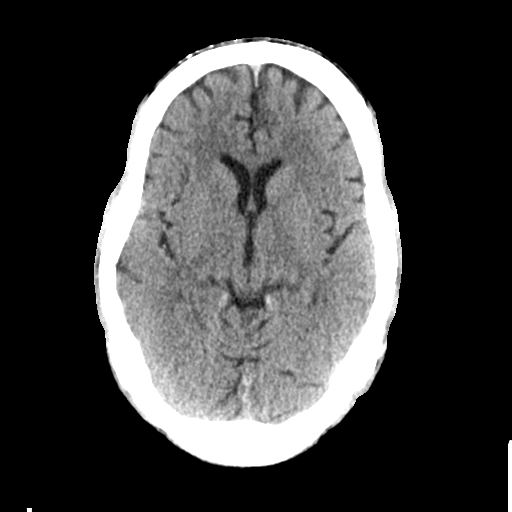
[im 16/30  brain]
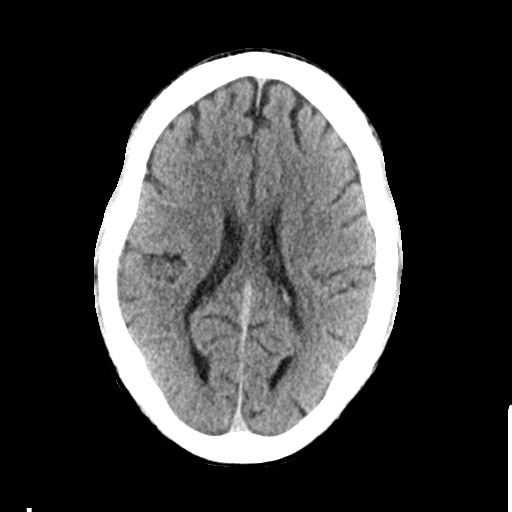
[im 16/30  bone]
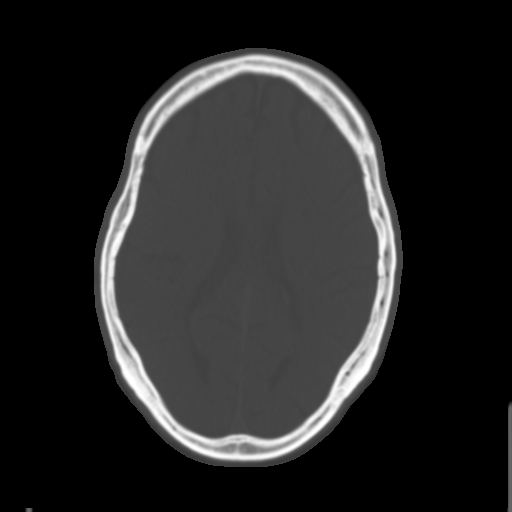
[im 19/30  brain]
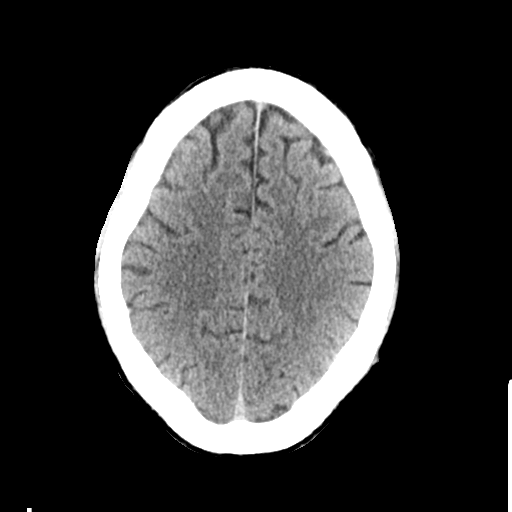
[im 22/30  brain]
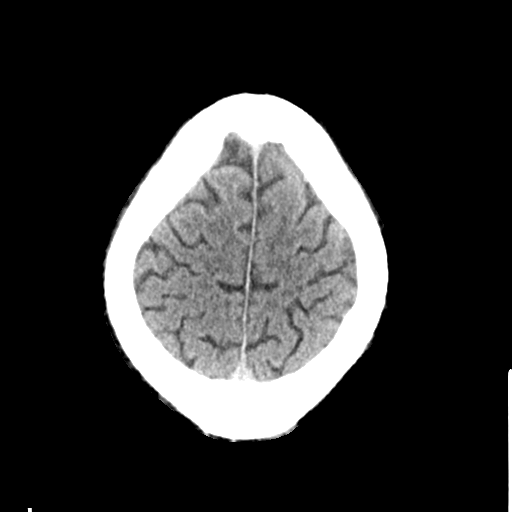
[im 25/30  brain]
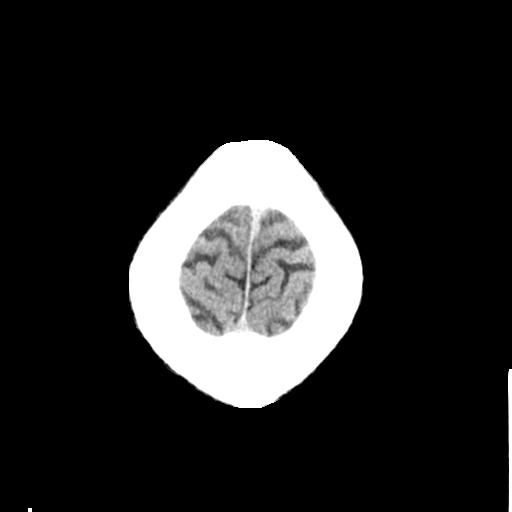
[im 28/30  brain]
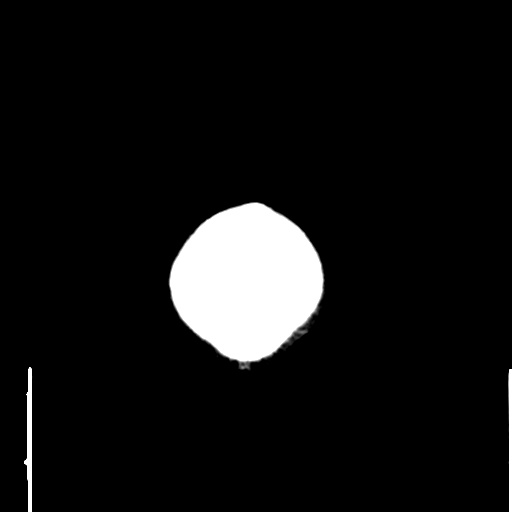
[im 28/30  bone]
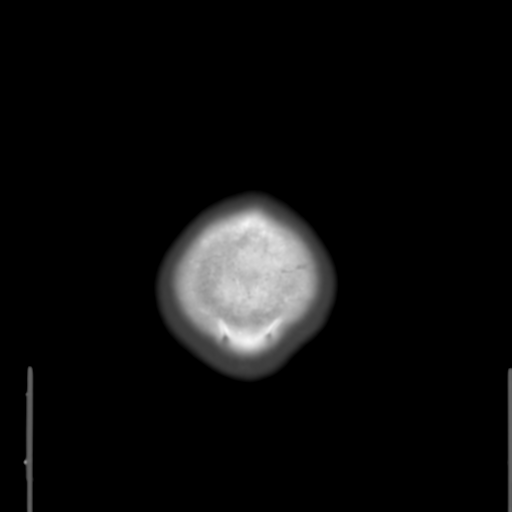

[Series 4: coronal soft tissue · coronal · 0.30mm/px · 3 of 69 slices shown]
[im 23/69  brain]
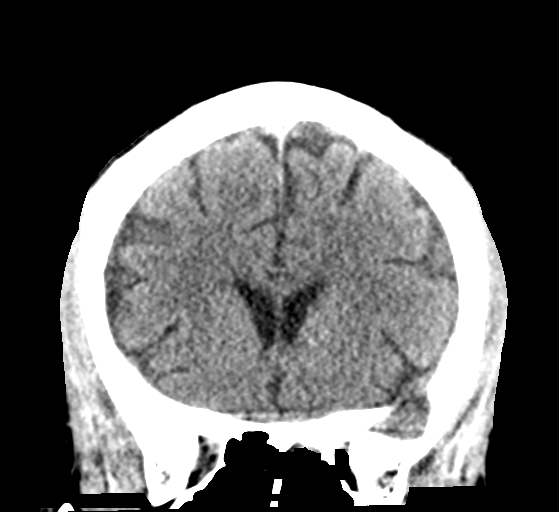
[im 31/69  brain]
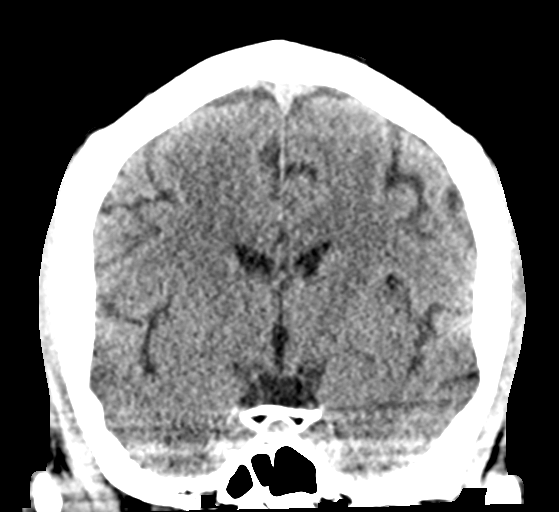
[im 38/69  brain]
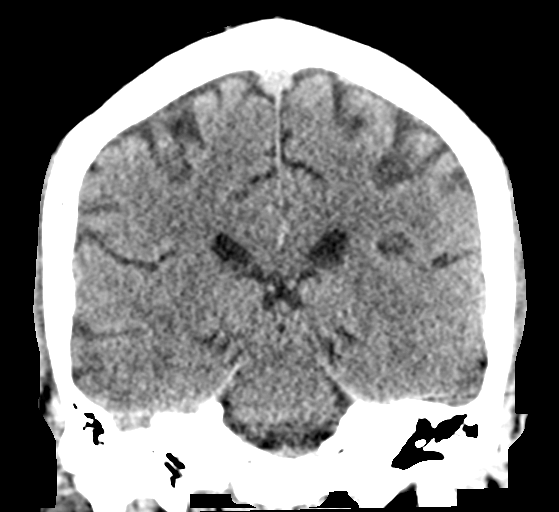

[Series 5: sagittal soft tissue · sagittal · 0.32mm/px · 3 of 53 slices shown]
[im 18/53  brain]
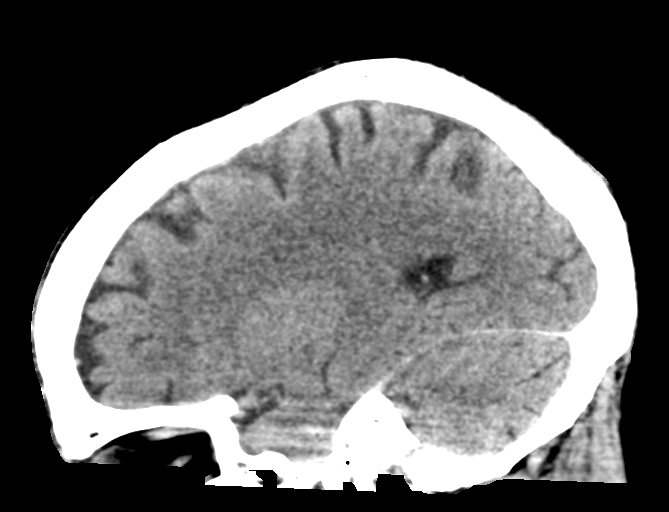
[im 27/53  brain]
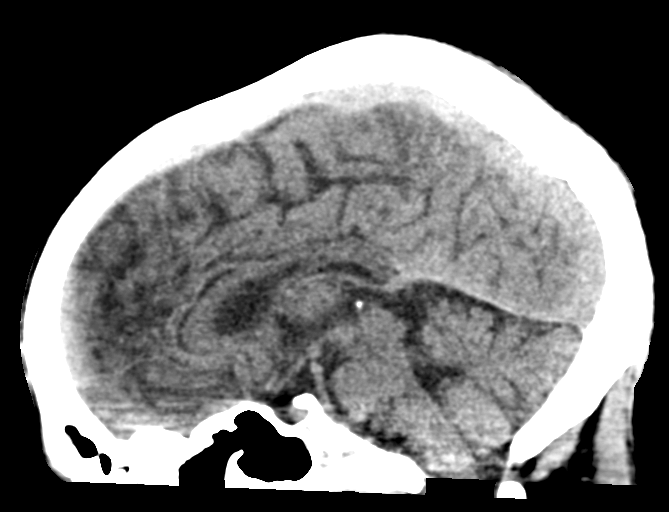
[im 35/53  brain]
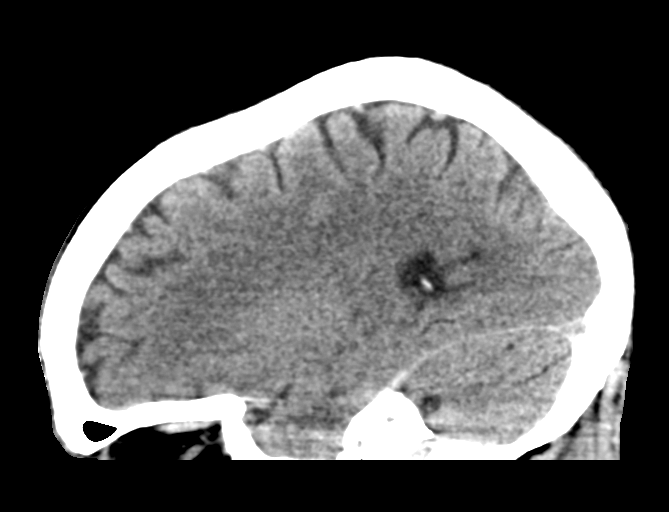

[15 of 47 positions shown; findings below may reference images not displayed]

FINDINGS: Brain: No acute intracranial hemorrhage. No focal mass lesion. No CT
evidence of acute infarction. No midline shift or mass effect. No
hydrocephalus. Basilar cisterns are patent.

Mild periventricular white matter hypodensities

Vascular: No hyperdense vessel or unexpected calcification.

Skull: Normal. Negative for fracture or focal lesion.

Sinuses/Orbits: Paranasal sinuses and mastoid air cells are clear.
Orbits are clear.

Other: None.
IMPRESSION: 1. No acute intracranial findings.
2. Minimal white matter microvascular disease

## 2018-08-07 MED ORDER — VITAMIN B-1 100 MG PO TABS
100.0000 mg | ORAL_TABLET | Freq: Once | ORAL | Status: AC
  Administered 2018-08-07: 100 mg via ORAL
  Filled 2018-08-07: qty 1

## 2018-08-07 MED ORDER — FOLIC ACID 1 MG PO TABS
1.0000 mg | ORAL_TABLET | Freq: Once | ORAL | Status: AC
  Administered 2018-08-07: 1 mg via ORAL
  Filled 2018-08-07: qty 1

## 2018-08-07 NOTE — ED Notes (Signed)
Pt  States he is more or less homeless  He states sometimes he stays with his aunt  And sometimes with other people he states he was picked up at the truck stop by the ems  After someone had dropped him off

## 2018-08-07 NOTE — ED Triage Notes (Addendum)
Per EMS report, patient called EMS due to having weakness and numbness in bilateral hands and feet for one year. Patient has ETOH on board. Patient was incontinent of urine on pants and socks. Patient had his appendix out  and blames the numbness on that. Patient states he drank two 16oz beers today.

## 2018-08-07 NOTE — ED Provider Notes (Signed)
[facility] Emergency Department Provider Note   ____________________________________________   First MD Initiated Contact with Patient 08/07/18 1234     (approximate)  I have reviewed the triage vital signs and the nursing notes.   HISTORY  Chief Complaint Weakness    HPI Lance Green is a 42 y.o. male presents for evaluation of whole body numbness  Patient reports for about a year now he is been experiencing numbness in his hands feet arms and legs.  Is been ongoing issue for about 1 year, he reports it seems like it gets better some days and worse at others.  They reports like he just feels tingly and numb from about his elbows down to his hands and his knees down to his feet.  Denies medical illness except for recent appendicitis.  He is eating and drinking well, reports he is eating 3 full meals a day, also does continue to drink alcohol and reports since around age 85 he is drank at least 2 beers a day.  He had 216 ounce beers this morning.  No fevers or chills.  No nausea vomiting.  Denies abdominal pain.   History reviewed. No pertinent past medical history.  Patient Active Problem List   Diagnosis Date Noted  . Acute appendicitis with localized peritonitis 07/06/2018    Past Surgical History:  Procedure Laterality Date  . LAPAROSCOPIC APPENDECTOMY N/A 07/06/2018   Procedure: APPENDECTOMY LAPAROSCOPIC;  Surgeon: Carolan Shiver, MD;  Location: [facility] ORS;  Service: General;  Laterality: N/A;    Prior to Admission medications   Medication Sig Start Date End Date Taking? Authorizing Provider  HYDROcodone-acetaminophen (NORCO) 7.5-325 MG tablet Take 1 tablet by mouth every 6 (six) hours as needed for moderate pain. 07/07/18   Carolan Shiver, MD    Allergies Patient has no known allergies.  No family history on file.  Social History Social History   Tobacco Use  . Smoking status: Current Every Day Smoker    Packs/day:  0.50    Types: Cigarettes  . Smokeless tobacco: Never Used  Substance Use Topics  . Alcohol use: Yes    Comment: daily 40 oz  . Drug use: No    Review of Systems Constitutional: No fever/chills Eyes: No visual changes. ENT: No sore throat. Cardiovascular: Denies chest pain. Respiratory: Denies shortness of breath. Gastrointestinal: No abdominal pain.   Genitourinary: Negative for dysuria. Musculoskeletal: Negative for back pain. Skin: Negative for rash. Neurological: Negative for headaches, areas of focal weakness, or other concerns except for numbness in hands and feet.    ____________________________________________   PHYSICAL EXAM:  VITAL SIGNS: ED Triage Vitals  Enc Vitals Group     BP 08/07/18 1212 (!) 131/91     Pulse Rate 08/07/18 1212 81     Resp 08/07/18 1212 18     Temp 08/07/18 1212 97.8 F (36.6 C)     Temp Source 08/07/18 1212 Oral     SpO2 08/07/18 1212 99 %     Weight 08/07/18 1213 145 lb (65.8 kg)     Height 08/07/18 1213 5\' 8"  (1.727 m)     Head Circumference --      Peak Flow --      Pain Score 08/07/18 1213 0     Pain Loc --      Pain Edu? --      Excl. in GC? --     Constitutional: Alert and oriented. Well appearing and in no acute distress. Eyes: Conjunctivae  are normal. Head: Atraumatic. Nose: No congestion/rhinnorhea. Mouth/Throat: Mucous membranes are moist. Neck: No stridor.  Cardiovascular: Normal rate, regular rhythm. Grossly normal heart sounds.  Good peripheral circulation. Respiratory: Normal respiratory effort.  No retractions. Lungs CTAB. Gastrointestinal: Soft and nontender. No distention. Old trocar incisions on the abdomen are clean dry and intact. Musculoskeletal: No lower extremity tenderness nor edema.  He exhibits normal strength in all extremities.  Good use and coordination of the hands and feet.  No pronator drift. Neurologic:  Normal speech and language. No gross focal neurologic deficits are appreciated.  Equal  smile.  Clear speech.  He reports decreased sensation bilaterally from both the elbows down to the hands and also from the knees down to the feet.  He is able to use his hands with normal motor use, but does report a notable loss of sensation in those areas.  Normal sensation across the trunk and abdomen as well as the face bilaterally. Skin:  Skin is warm, dry and intact. No rash noted. Psychiatric: Mood and affect are normal. Speech and behavior are normal.  ____________________________________________   LABS (all labs ordered are listed, but only abnormal results are displayed)  Labs Reviewed  BASIC METABOLIC PANEL - Abnormal; Notable for the following components:      Result Value   BUN <5 (*)    All other components within normal limits  CBC  MAGNESIUM  VITAMIN B12   ____________________________________________  EKG   ____________________________________________  RADIOLOGY  CT head reviewed negative for acute findings ____________________________________________   PROCEDURES  Procedure(s) performed: None  Procedures  Critical Care performed: No  ____________________________________________   INITIAL IMPRESSION / ASSESSMENT AND PLAN / ED COURSE  Pertinent labs & imaging results that were available during my care of the patient were reviewed by me and considered in my medical decision making (see chart for details).   Patient presents for paresthesia, review of his records this appears to be a chronic condition I discussed with the patient he states that he has not had the opportunity to follow-up with a specialist on this.  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  He is not a known diabetic.  He has no evidence of motor involvement, and his symptoms appear to be chronic in nature without evidence of acute myelopathy or central nervous system process.  Will obtain a CT of the head as he has  not had any cranial imaging in several years, sure no evidence of small infarct or thalamic stroke that might explain his symptoms and though my overall suspicion is very low.  Patient does report regular alcohol use, currently does not appear clinically intoxicated.    ----------------------------------------- 3:09 PM on 08/07/2018 -----------------------------------------  Patient resting comfortably, no distress.  Stable examination.  Fully alert.  Discussed with patient and he presents his wife also at the bedside reports that he is understanding that his evaluation in the here is reassuring.  I recommend he follow-up with neurology.  Patient alert oriented, no evidence of a rapidly progressive neurologic process to noted, appears chronic in nature.  Discussed with patient I suspect this may be due to his chronic alcohol use, but he needed further follow-up with neurology.  Return precautions and treatment recommendations and follow-up discussed with the patient who is agreeable with the plan.   ____________________________________________   FINAL CLINICAL IMPRESSION(S) / ED DIAGNOSES  Final diagnoses:  Paresthesias  Note:  This document was prepared using Conservation officer, historic buildings and may include unintentional dictation errors       Sharyn Creamer, MD 08/07/18 1510

## 2018-11-10 ENCOUNTER — Emergency Department: Payer: Self-pay

## 2018-11-10 ENCOUNTER — Inpatient Hospital Stay
Admission: EM | Admit: 2018-11-10 | Discharge: 2018-11-14 | DRG: 603 | Disposition: A | Discharge status: Home / Self Care | Payer: Self-pay | Attending: Orthopedic Surgery | Admitting: Orthopedic Surgery

## 2018-11-10 ENCOUNTER — Other Ambulatory Visit: Payer: Self-pay

## 2018-11-10 DIAGNOSIS — L02512 Cutaneous abscess of left hand: Principal | ICD-10-CM | POA: Diagnosis present

## 2018-11-10 DIAGNOSIS — L089 Local infection of the skin and subcutaneous tissue, unspecified: Secondary | ICD-10-CM

## 2018-11-10 DIAGNOSIS — M659 Synovitis and tenosynovitis, unspecified: Secondary | ICD-10-CM | POA: Diagnosis present

## 2018-11-10 DIAGNOSIS — Z8719 Personal history of other diseases of the digestive system: Secondary | ICD-10-CM

## 2018-11-10 DIAGNOSIS — Z833 Family history of diabetes mellitus: Secondary | ICD-10-CM

## 2018-11-10 DIAGNOSIS — Z803 Family history of malignant neoplasm of breast: Secondary | ICD-10-CM

## 2018-11-10 DIAGNOSIS — G629 Polyneuropathy, unspecified: Secondary | ICD-10-CM | POA: Diagnosis present

## 2018-11-10 DIAGNOSIS — Z8711 Personal history of peptic ulcer disease: Secondary | ICD-10-CM

## 2018-11-10 DIAGNOSIS — Z9089 Acquired absence of other organs: Secondary | ICD-10-CM

## 2018-11-10 DIAGNOSIS — F17210 Nicotine dependence, cigarettes, uncomplicated: Secondary | ICD-10-CM | POA: Diagnosis present

## 2018-11-10 HISTORY — DX: Peptic ulcer, site unspecified, unspecified as acute or chronic, without hemorrhage or perforation: K27.9

## 2018-11-10 HISTORY — DX: Polyneuropathy, unspecified: G62.9

## 2018-11-10 HISTORY — DX: Acute pancreatitis without necrosis or infection, unspecified: K85.90

## 2018-11-10 LAB — URINE DRUG SCREEN, QUALITATIVE (ARMC ONLY)
Amphetamines, Ur Screen: NOT DETECTED
Barbiturates, Ur Screen: NOT DETECTED
Benzodiazepine, Ur Scrn: NOT DETECTED
CANNABINOID 50 NG, UR ~~LOC~~: NOT DETECTED
COCAINE METABOLITE, UR ~~LOC~~: NOT DETECTED
MDMA (Ecstasy)Ur Screen: NOT DETECTED
Methadone Scn, Ur: NOT DETECTED
Opiate, Ur Screen: NOT DETECTED
PHENCYCLIDINE (PCP) UR S: NOT DETECTED
TRICYCLIC, UR SCREEN: NOT DETECTED

## 2018-11-10 LAB — COMPREHENSIVE METABOLIC PANEL
ALBUMIN: 4.1 g/dL (ref 3.5–5.0)
ALT: 16 U/L (ref 0–44)
ANION GAP: 9 (ref 5–15)
AST: 32 U/L (ref 15–41)
Alkaline Phosphatase: 114 U/L (ref 38–126)
BUN: 5 mg/dL — AB (ref 6–20)
CHLORIDE: 107 mmol/L (ref 98–111)
CO2: 24 mmol/L (ref 22–32)
Calcium: 9.3 mg/dL (ref 8.9–10.3)
Creatinine, Ser: 0.57 mg/dL — ABNORMAL LOW (ref 0.61–1.24)
GFR calc Af Amer: >60 mL/min (ref >60)
Glucose, Bld: 94 mg/dL (ref 70–99)
POTASSIUM: 3.7 mmol/L (ref 3.5–5.1)
Sodium: 140 mmol/L (ref 135–145)
TOTAL PROTEIN: 8.3 g/dL — AB (ref 6.5–8.1)
Total Bilirubin: 0.4 mg/dL (ref 0.3–1.2)

## 2018-11-10 LAB — CBC WITH DIFFERENTIAL/PLATELET
ABS IMMATURE GRANULOCYTES: 0.07 10*3/uL (ref 0.00–0.07)
BASOS ABS: 0.1 10*3/uL (ref 0.0–0.1)
BASOS PCT: 0 %
Eosinophils Absolute: 0 10*3/uL (ref 0.0–0.5)
Eosinophils Relative: 0 %
HCT: 39.5 % (ref 39.0–52.0)
HEMOGLOBIN: 13.9 g/dL (ref 13.0–17.0)
IMMATURE GRANULOCYTES: 1 %
LYMPHS PCT: 28 %
Lymphs Abs: 3.4 10*3/uL (ref 0.7–4.0)
MCH: 31.5 pg (ref 26.0–34.0)
MCHC: 35.2 g/dL (ref 30.0–36.0)
MCV: 89.6 fL (ref 80.0–100.0)
MONO ABS: 0.7 10*3/uL (ref 0.1–1.0)
Monocytes Relative: 5 %
NEUTROS ABS: 8.1 10*3/uL — AB (ref 1.7–7.7)
NEUTROS PCT: 66 %
PLATELETS: 205 10*3/uL (ref 150–400)
RBC: 4.41 MIL/uL (ref 4.22–5.81)
RDW: 13 % (ref 11.5–15.5)
WBC: 12.3 10*3/uL — AB (ref 4.0–10.5)
nRBC: 0 % (ref 0.0–0.2)

## 2018-11-10 LAB — ETHANOL: Alcohol, Ethyl (B): 256 mg/dL — ABNORMAL HIGH (ref <10)

## 2018-11-10 LAB — LACTIC ACID, PLASMA: LACTIC ACID, VENOUS: 1.3 mmol/L (ref 0.5–1.9)

## 2018-11-10 IMAGING — DX DG FINGER MIDDLE 2+V*L*
3 series · 3 of 3 positions shown · non-contrast
Comparison: None.

CLINICAL DATA: Pain and swelling of the left middle finger. The
patient reports that he is been squeezing pus out of it.

EXAM:
LEFT MIDDLE FINGER 2+V

[finger ap]
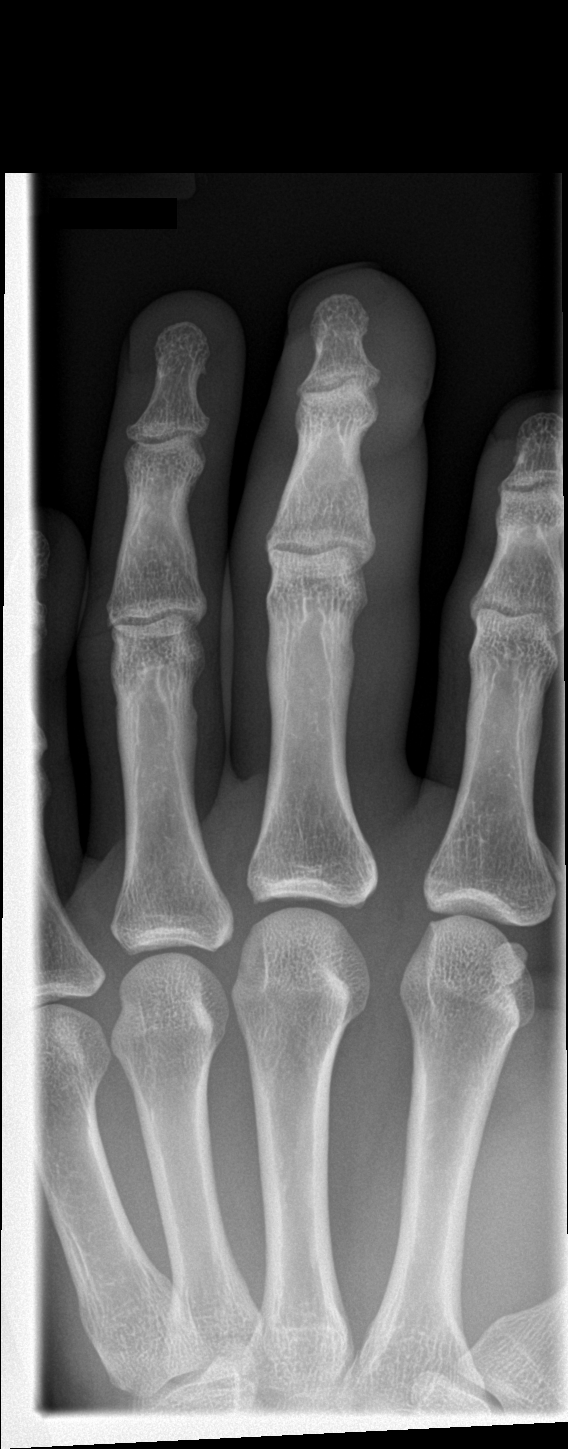

[finger lat]
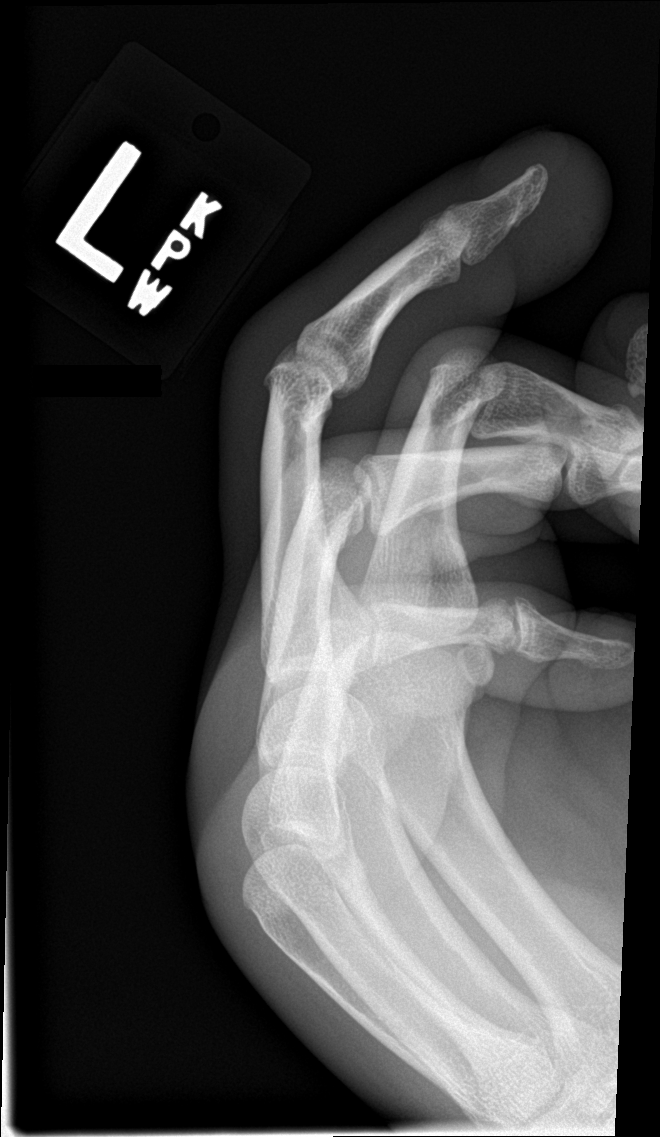

[finger obl]
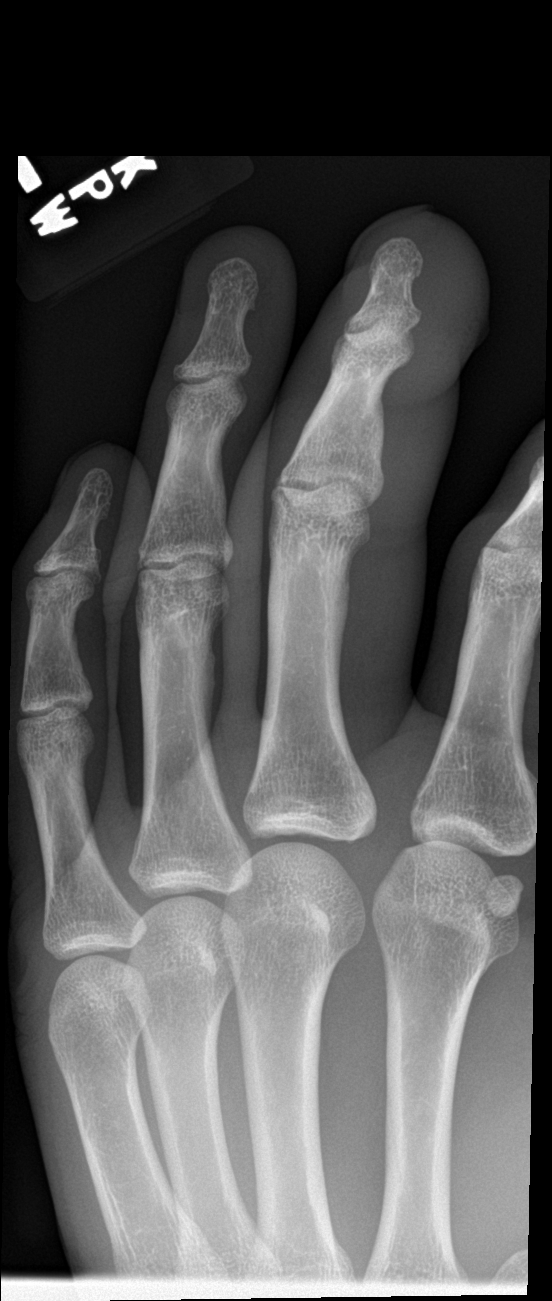

[3 of 3 positions shown; findings below may reference images not displayed]

FINDINGS: The bones are normal. There is diffuse soft tissue swelling of the
left middle finger. No visible foreign body in the soft tissues.
IMPRESSION: Soft tissue swelling.  Otherwise negative.

## 2018-11-10 MED ORDER — ADULT MULTIVITAMIN W/MINERALS CH
1.0000 | ORAL_TABLET | Freq: Every day | ORAL | Status: DC
  Administered 2018-11-12: 1 via ORAL
  Filled 2018-11-10 (×2): qty 1

## 2018-11-10 MED ORDER — SODIUM CHLORIDE 0.9 % IV SOLN
3.0000 g | Freq: Four times a day (QID) | INTRAVENOUS | Status: DC
  Administered 2018-11-11 – 2018-11-12 (×8): 3 g via INTRAVENOUS
  Filled 2018-11-10 (×11): qty 3

## 2018-11-10 MED ORDER — LORAZEPAM 2 MG/ML IJ SOLN: 1.0000 mg | Freq: Four times a day (QID) | INTRAMUSCULAR | Status: AC | PRN

## 2018-11-10 MED ORDER — SENNA 8.6 MG PO TABS
1.0000 | ORAL_TABLET | Freq: Two times a day (BID) | ORAL | Status: DC
  Administered 2018-11-11 – 2018-11-13 (×6): 8.6 mg via ORAL
  Filled 2018-11-10 (×7): qty 1

## 2018-11-10 MED ORDER — SODIUM CHLORIDE 0.9 % IV SOLN
INTRAVENOUS | Status: DC
  Administered 2018-11-11 – 2018-11-12 (×2): via INTRAVENOUS

## 2018-11-10 MED ORDER — BISACODYL 5 MG PO TBEC: 5.0000 mg | DELAYED_RELEASE_TABLET | Freq: Every day | ORAL | Status: DC | PRN

## 2018-11-10 MED ORDER — THIAMINE HCL 100 MG/ML IJ SOLN
100.0000 mg | Freq: Every day | INTRAMUSCULAR | Status: DC
  Administered 2018-11-10: 100 mg via INTRAVENOUS
  Filled 2018-11-10 (×2): qty 2

## 2018-11-10 MED ORDER — SODIUM CHLORIDE 0.9 % IV SOLN
3.0000 g | Freq: Once | INTRAVENOUS | Status: AC
  Administered 2018-11-10: 3 g via INTRAVENOUS
  Filled 2018-11-10: qty 3

## 2018-11-10 MED ORDER — SODIUM CHLORIDE 0.9 % IV BOLUS
1000.0000 mL | Freq: Once | INTRAVENOUS | Status: AC
  Administered 2018-11-10: 1000 mL via INTRAVENOUS

## 2018-11-10 MED ORDER — HYDROMORPHONE HCL 1 MG/ML IJ SOLN: 0.5000 mg | INTRAMUSCULAR | Status: DC | PRN

## 2018-11-10 MED ORDER — VITAMIN B-1 100 MG PO TABS
100.0000 mg | ORAL_TABLET | Freq: Every day | ORAL | Status: DC
  Administered 2018-11-11 – 2018-11-14 (×4): 100 mg via ORAL
  Filled 2018-11-10 (×4): qty 1

## 2018-11-10 MED ORDER — VITAMIN C 500 MG PO TABS
1000.0000 mg | ORAL_TABLET | Freq: Every day | ORAL | Status: DC
  Administered 2018-11-11 – 2018-11-14 (×4): 1000 mg via ORAL
  Filled 2018-11-10 (×4): qty 2

## 2018-11-10 MED ORDER — MAGNESIUM CITRATE PO SOLN
1.0000 | Freq: Once | ORAL | Status: DC | PRN
  Filled 2018-11-10: qty 296

## 2018-11-10 MED ORDER — ADULT MULTIVITAMIN W/MINERALS CH
1.0000 | ORAL_TABLET | Freq: Every day | ORAL | Status: DC
  Administered 2018-11-11 – 2018-11-14 (×4): 1 via ORAL
  Filled 2018-11-10 (×4): qty 1

## 2018-11-10 MED ORDER — FOLIC ACID 1 MG PO TABS
1.0000 mg | ORAL_TABLET | Freq: Every day | ORAL | Status: DC
  Administered 2018-11-11 – 2018-11-14 (×4): 1 mg via ORAL
  Filled 2018-11-10 (×4): qty 1

## 2018-11-10 MED ORDER — VANCOMYCIN HCL IN DEXTROSE 1-5 GM/200ML-% IV SOLN
1000.0000 mg | Freq: Two times a day (BID) | INTRAVENOUS | Status: DC
  Administered 2018-11-11 – 2018-11-14 (×7): 1000 mg via INTRAVENOUS
  Filled 2018-11-10 (×8): qty 200

## 2018-11-10 MED ORDER — ONDANSETRON HCL 4 MG/2ML IJ SOLN: 4.0000 mg | Freq: Four times a day (QID) | INTRAMUSCULAR | Status: DC | PRN

## 2018-11-10 MED ORDER — TETANUS-DIPHTH-ACELL PERTUSSIS 5-2.5-18.5 LF-MCG/0.5 IM SUSP
0.5000 mL | Freq: Once | INTRAMUSCULAR | Status: AC
  Administered 2018-11-10: 0.5 mL via INTRAMUSCULAR
  Filled 2018-11-10: qty 0.5

## 2018-11-10 MED ORDER — PANTOPRAZOLE SODIUM 40 MG PO TBEC
40.0000 mg | DELAYED_RELEASE_TABLET | Freq: Two times a day (BID) | ORAL | Status: DC
  Administered 2018-11-11 – 2018-11-14 (×7): 40 mg via ORAL
  Filled 2018-11-10 (×7): qty 1

## 2018-11-10 MED ORDER — VANCOMYCIN HCL IN DEXTROSE 1-5 GM/200ML-% IV SOLN
1000.0000 mg | Freq: Once | INTRAVENOUS | Status: AC
  Administered 2018-11-10: 1000 mg via INTRAVENOUS
  Filled 2018-11-10: qty 200

## 2018-11-10 MED ORDER — ONDANSETRON HCL 4 MG PO TABS: 4.0000 mg | ORAL_TABLET | Freq: Four times a day (QID) | ORAL | Status: DC | PRN

## 2018-11-10 MED ORDER — OXYCODONE HCL 5 MG PO TABS
5.0000 mg | ORAL_TABLET | ORAL | Status: DC | PRN
  Administered 2018-11-11: 5 mg via ORAL
  Administered 2018-11-11: 10 mg via ORAL
  Administered 2018-11-11 – 2018-11-14 (×3): 5 mg via ORAL
  Filled 2018-11-10: qty 2
  Filled 2018-11-10 (×4): qty 1

## 2018-11-10 MED ORDER — MAGNESIUM HYDROXIDE 400 MG/5ML PO SUSP: 30.0000 mL | Freq: Every day | ORAL | Status: DC | PRN

## 2018-11-10 MED ORDER — LORAZEPAM 1 MG PO TABS: 1.0000 mg | ORAL_TABLET | Freq: Four times a day (QID) | ORAL | Status: AC | PRN

## 2018-11-10 NOTE — ED Triage Notes (Signed)
Pt arrives [facility] from home for L middle finger swelling. Unsure of injury, states neuropathy in hand. States he poked pins in it to try to relief pressure. Lac noted to distal pad of L middle finger. Swelling noted. Pt states he has been squeezing pus out of it. A&O, in wheelchair. VSS with EMS.

## 2018-11-10 NOTE — ED Notes (Signed)
OR called for report

## 2018-11-10 NOTE — Progress Notes (Signed)
Pharmacy Antibiotic Note  Carlyon ProwsMichael L Hilyard is a 43 y.o. male admitted on 11/10/2018 with orthopedic hand surgery.  Pharmacy has been consulted for vancomycin dosing.  Plan: DW 68 kg  Vd 44L Ki 0.098 hr-1  T1/2 7 hours  Vancomycin 1000 mg IV Q 12 hrs. Goal AUC 400-550. Expected AUC: 460 SCr used: 0.8   Height: 5\' 9"  (175.3 cm) Weight: 150 lb (68 kg) IBW/kg (Calculated) : 70.7  Temp (24hrs), Avg:97.7 F (36.5 C), Min:97.6 F (36.4 C), Max:97.8 F (36.6 C)  Recent Labs  Lab 11/10/18 1636 11/10/18 1953  WBC 12.3*  --   CREATININE 0.57*  --   LATICACIDVEN  --  1.3    Estimated Creatinine Clearance: 113.3 mL/min (A) (by C-G formula based on SCr of 0.57 mg/dL (L)).    No Known Allergies  Antimicrobials this admission: Vancomycin, Unasyn 2/5>>    >>   Dose adjustments this admission:   Microbiology results:   Thank you for allowing pharmacy to be a part of this patient's care.  Aanya Haynes S 11/10/2018 10:34 PM

## 2018-11-10 NOTE — ED Provider Notes (Signed)
[facility] Emergency Department Provider Note  ____________________________________________   First MD Initiated Contact with Patient 11/10/18 1536     (approximate)  I have reviewed the triage vital signs and the nursing notes.   HISTORY  Chief Complaint Finger Injury    HPI Lance Green is a 43 y.o. male presents emergency department with an infection and the left middle finger.  He states that he had a small amount of infection at the tip of the finger and used a knife to open it.  Now he is noticed that the swelling has gotten worse and extends into his hand.  He denies fever chills.  Denies chest pain or shortness of breath.  He is unsure of his last Tdap.    Past Medical History:  Diagnosis Date  . Neuropathy   . Pancreatitis   . Peptic ulcer disease     Patient Active Problem List   Diagnosis Date Noted  . Abscess of left middle finger 11/10/2018  . Acute appendicitis with localized peritonitis 07/06/2018    Past Surgical History:  Procedure Laterality Date  . APPENDECTOMY    . LAPAROSCOPIC APPENDECTOMY N/A 07/06/2018   Procedure: APPENDECTOMY LAPAROSCOPIC;  Surgeon: Carolan Shiver, MD;  Location: [facility] ORS;  Service: General;  Laterality: N/A;    Prior to Admission medications   Not on File    Allergies Patient has no known allergies.  History reviewed. No pertinent family history.  Social History Social History   Tobacco Use  . Smoking status: Current Every Day Smoker    Packs/day: 0.50    Types: Cigarettes  . Smokeless tobacco: Never Used  Substance Use Topics  . Alcohol use: Yes    Comment: daily 40 oz  . Drug use: No    Review of Systems  Constitutional: No fever/chills Eyes: No visual changes. ENT: No sore throat. Respiratory: Denies cough Genitourinary: Negative for dysuria. Musculoskeletal: Negative for back pain.  Positive for left middle finger infection Skin: Negative for  rash.    ____________________________________________   PHYSICAL EXAM:  VITAL SIGNS: ED Triage Vitals  Enc Vitals Group     BP 11/10/18 1511 131/90     Pulse Rate 11/10/18 1511 79     Resp 11/10/18 1511 18     Temp 11/10/18 1511 97.6 F (36.4 C)     Temp Source 11/10/18 1511 Oral     SpO2 11/10/18 1511 99 %     Weight 11/10/18 1512 150 lb (68 kg)     Height 11/10/18 1512 5\' 9"  (1.753 m)     Head Circumference --      Peak Flow --      Pain Score 11/10/18 1512 6     Pain Loc --      Pain Edu? --      Excl. in GC? --     Constitutional: Alert and oriented. Well appearing and in no acute distress. Eyes: Conjunctivae are normal.  Head: Atraumatic. Nose: No congestion/rhinnorhea. Mouth/Throat: Mucous membranes are moist.   Neck:  supple no lymphadenopathy noted Cardiovascular: Normal rate, regular rhythm. Heart sounds are normal Respiratory: Normal respiratory effort.  No retractions, lungs c t a  GU: deferred Musculoskeletal: Decreased range of motion of the left middle finger, left middle finger is grossly red and swollen extending into the hand.  Positive flexor tenosynovitis.  Pus is noted at the distal aspect of the finger.  Neurovascular is intact  neurologic:  Normal speech and language.  Skin:  Skin is warm, dry and intact. No rash noted. Psychiatric: Mood and affect are normal. Speech and behavior are normal.  ____________________________________________   LABS (all labs ordered are listed, but only abnormal results are displayed)  Labs Reviewed  COMPREHENSIVE METABOLIC PANEL - Abnormal; Notable for the following components:      Result Value   BUN 5 (*)    Creatinine, Ser 0.57 (*)    Total Protein 8.3 (*)    All other components within normal limits  CBC WITH DIFFERENTIAL/PLATELET - Abnormal; Notable for the following components:   WBC 12.3 (*)    Neutro Abs 8.1 (*)    All other components within normal limits  ETHANOL - Abnormal; Notable for the  following components:   Alcohol, Ethyl (B) 256 (*)    All other components within normal limits  URINE DRUG SCREEN, QUALITATIVE ([facility] ONLY)  LACTIC ACID, PLASMA  LACTIC ACID, PLASMA   ____________________________________________   ____________________________________________  RADIOLOGY  X-ray of the left middle finger is negative for foreign body or fracture, no osteomyelitis is noted  ____________________________________________   PROCEDURES  Procedure(s) performed: Saline lock, normal saline 1 L IV, Unasyn and vancomycin IV  Procedures    ____________________________________________   INITIAL IMPRESSION / ASSESSMENT AND PLAN / ED COURSE  Pertinent labs & imaging results that were available during my care of the patient were reviewed by me and considered in my medical decision making (see chart for details).   Patient is 43 year old male presents emergency department with finger infection.  Physical exam shows a tenosynovitis of the left middle finger with redness being circumferential and extending into the hand.  Positive for a pus pocket noted at the distal aspect of the finger.  X-ray of the left middle finger is negative for osteomyelitis or foreign body. CBC shows a WBC of 12.3, metabolic panel is normal  Patient was given Unasyn and vancomycin IV.  Discussed all of this with Dr. Derrill KayGoodman.  He states to call orthopedics and have patient admitted.  Page Dr. Ernest PineHooten    ----------------------------------------- 5:49 PM on 11/10/2018 -----------------------------------------  Called secretaries re-page Dr. Ernest PineHooten.  She states he is in the OR at this time.  He is to call the major side back also so she will tell him to call us.  ----------------------------------------- 7:08 PM on 11/10/2018 -----------------------------------------  Dr. Ernest PineHooten returned her call.  He will be coming to the ED to see the patient.  Due to some of the patient's past medical  history he would like for us to add a UDS and EtOH level.  Patient EtOH level was 256, UDS is negative, lactic acid is negative  Dr. Ernest PineHooten to see the patient.  He states that due to him eating a "full chicken dinner "at approximately 6 he will have to wait to take him to the OR in the morning.  He is admitting the patient to his service.  As part of my medical decision making, I reviewed the following data within the electronic MEDICAL RECORD NUMBER History obtained from family, Nursing notes reviewed and incorporated, Labs reviewed CBC with elevated WBC at 12.3, lactic acid is normal, urine drug screen is normal, comprehensive metabolic panel is normal, EtOH level is elevated at 256, Old chart reviewed, Radiograph reviewed x-ray of the left middle finger is negative, A consult was requested and obtained from this/these consultant(s) Orthopedics, Notes from prior ED visits and [place] Controlled Substance Database  ____________________________________________   FINAL CLINICAL IMPRESSION(S) / ED DIAGNOSES  Final diagnoses:  Finger infection  Tenosynovitis      NEW MEDICATIONS STARTED DURING THIS VISIT:  New Prescriptions   No medications on file     Note:  This document was prepared using Dragon voice recognition software and may include unintentional dictation errors.    Faythe Ghee, PA-C 11/10/18 2157    Phineas Semen, MD 11/10/18 2204

## 2018-11-10 NOTE — H&P (Signed)
ORTHOPAEDIC HISTORY & PHYSICAL  PATIENT NAME: Lance Green DOB: 1976/05/16  MRN: [id number]  Chief Complaint: Left long finger infection  HPI: Lance Green is a 43 y.o. right hand dominant male who reports a 1 week history of pain and swelling to the distal aspect of the left long finger. He denies any trauma or aggravating event. Approximated 4 days ago he pierced the finger with a pin to "relieve the pressure" and had some purulent drainage from the site. Two days ago he cut across the pulp of the distal long finger to "relieve more pressure". He denies any fevers or chills. He has had swelling extending up the finger to the palm.  Past Medical History:  Diagnosis Date  . Neuropathy   . Pancreatitis   . Peptic ulcer disease    Past Surgical History:  Procedure Laterality Date  . APPENDECTOMY    . LAPAROSCOPIC APPENDECTOMY N/A 07/06/2018   Procedure: APPENDECTOMY LAPAROSCOPIC;  Surgeon: Carolan Shiverintron-Diaz, Edgardo, MD;  Location: [facility] ORS;  Service: General;  Laterality: N/A;   Social History   Socioeconomic History  . Marital status: Single    Spouse name: Not on file  . Number of children: Not on file  . Years of education: Not on file  . Highest education level: Not on file  Occupational History  . Not on file  Social Needs  . Financial resource strain: Not on file  . Food insecurity:    Worry: Not on file    Inability: Not on file  . Transportation needs:    Medical: Not on file    Non-medical: Not on file  Tobacco Use  . Smoking status: Current Every Day Smoker    Packs/day: 0.50    Types: Cigarettes  . Smokeless tobacco: Never Used  Substance and Sexual Activity  . Alcohol use: Yes    Comment: daily 40 oz  . Drug use: No  . Sexual activity: Not on file  Lifestyle  . Physical activity:    Days per week: Not on file    Minutes per session: Not on file  . Stress: Not on file  Relationships  . Social connections:    Talks on phone: Not on file    Gets  together: Not on file    Attends religious service: Not on file    Active member of club or organization: Not on file    Attends meetings of clubs or organizations: Not on file    Relationship status: Not on file  Other Topics Concern  . Not on file  Social History Narrative  . Not on file   History reviewed. No pertinent family history. No Known Allergies Prior to Admission medications   Not on File   Dg Finger Middle Left  Result Date: 11/10/2018 CLINICAL DATA:  Pain and swelling of the left middle finger. The patient reports that he is been squeezing pus out of it. EXAM: LEFT MIDDLE FINGER 2+V COMPARISON:  None. FINDINGS: The bones are normal. There is diffuse soft tissue swelling of the left middle finger. No visible foreign body in the soft tissues. IMPRESSION: Soft tissue swelling.  Otherwise negative. Electronically Signed   By: Francene BoyersJames  Maxwell M.D.   On: 11/10/2018 16:04    Positive ROS: All other systems have been reviewed and were otherwise negative with the exception of those mentioned in the HPI and as above.  Physical Exam: General: Well developed, well nourished male seen in no acute distress. HEENT: Atraumatic and normocephalic. Sclera  are clear. Extraocular motion is intact. Oropharynx is clear with moist mucosa. Neck: Supple, nontender, good range of motion. No JVD or carotid bruits. Lungs: Clear to auscultation bilaterally. Cardiovascular: Regular rate and rhythm with normal S1 and S2. No murmurs. No gallops or rubs. Pedal pulses are palpable bilaterally. Homans test is negative bilaterally. No significant pretibial or ankle edema. Abdomen: Soft, nontender, and nondistended. Bowel sounds are present. Skin: No lesions in the area of chief complaint Neurologic: Awake, alert, and oriented. Sensory function is grossly intact. Motor strength is felt to be 5 over 5 bilaterally. No clonus or tremor. Good motor coordination. Lymphatic: No axillary or cervical  lymphadenopathy  MUSCULOSKELETAL: Examination of the left long finger demonstrates generalized swelling. There is an oblique laceration across the pulp of the distal phalanx with malodorous, purulent drainage. Tenderness extends to the middle and proximal phalanges.  Assessment: Abscess of the left long finger  Plan: The findings were discussed in detail with the patient. I have recommended incision, irrigation and debridement of the left long finger. Antibiotics have already been initiated in the ER.The usual perioperative course was discussed. The risks and benefits of surgical intervention were reviewed. The patient expressed understanding of the risks and benefits and agreed with plans for surgical intervention.    P. Angie Fava M.D.

## 2018-11-10 NOTE — ED Notes (Signed)
Reference triage note. Pt left hand noted to be swollen at this time. Pt finger noted to have black tissue surrounding laceration site. Small amount of pus noted to be draining from area at this time. When this RN entered room, pt hard to arouse. Pt states "I am just really tired". MD aware. Pt is alert and oriented x4 at this time.

## 2018-11-11 ENCOUNTER — Encounter
Admission: EM | Disposition: A | Discharge status: Home / Self Care | Payer: Self-pay | Source: Home / Self Care | Attending: Orthopedic Surgery

## 2018-11-11 ENCOUNTER — Encounter: Payer: Self-pay | Admitting: Anesthesiology

## 2018-11-11 ENCOUNTER — Observation Stay: Payer: Self-pay | Admitting: Anesthesiology

## 2018-11-11 ENCOUNTER — Other Ambulatory Visit: Payer: Self-pay

## 2018-11-11 HISTORY — PX: INCISION AND DRAINAGE: SHX5863

## 2018-11-11 LAB — CBC WITH DIFFERENTIAL/PLATELET
Abs Immature Granulocytes: 0.09 10*3/uL — ABNORMAL HIGH (ref 0.00–0.07)
Basophils Absolute: 0 10*3/uL (ref 0.0–0.1)
Basophils Relative: 0 %
Eosinophils Absolute: 0 10*3/uL (ref 0.0–0.5)
Eosinophils Relative: 0 %
HCT: 39.1 % (ref 39.0–52.0)
Hemoglobin: 13.5 g/dL (ref 13.0–17.0)
Immature Granulocytes: 1 %
Lymphocytes Relative: 5 %
Lymphs Abs: 0.6 10*3/uL — ABNORMAL LOW (ref 0.7–4.0)
MCH: 31.6 pg (ref 26.0–34.0)
MCHC: 34.5 g/dL (ref 30.0–36.0)
MCV: 91.6 fL (ref 80.0–100.0)
Monocytes Absolute: 0.1 10*3/uL (ref 0.1–1.0)
Monocytes Relative: 1 %
Neutro Abs: 11.6 10*3/uL — ABNORMAL HIGH (ref 1.7–7.7)
Neutrophils Relative %: 93 %
Platelets: 217 10*3/uL (ref 150–400)
RBC: 4.27 MIL/uL (ref 4.22–5.81)
RDW: 13 % (ref 11.5–15.5)
WBC: 12.4 10*3/uL — AB (ref 4.0–10.5)
nRBC: 0 % (ref 0.0–0.2)

## 2018-11-11 LAB — SURGICAL PCR SCREEN
MRSA, PCR: NEGATIVE
Staphylococcus aureus: NEGATIVE

## 2018-11-11 SURGERY — INCISION AND DRAINAGE
Anesthesia: General | Laterality: Left

## 2018-11-11 MED ORDER — FENTANYL CITRATE (PF) 100 MCG/2ML IJ SOLN
INTRAMUSCULAR | Status: AC
  Filled 2018-11-11: qty 2

## 2018-11-11 MED ORDER — DEXAMETHASONE SODIUM PHOSPHATE 10 MG/ML IJ SOLN
INTRAMUSCULAR | Status: AC
  Filled 2018-11-11: qty 1

## 2018-11-11 MED ORDER — GLYCOPYRROLATE 0.2 MG/ML IJ SOLN
INTRAMUSCULAR | Status: AC
  Filled 2018-11-11: qty 1

## 2018-11-11 MED ORDER — PROPOFOL 10 MG/ML IV BOLUS
INTRAVENOUS | Status: AC
  Filled 2018-11-11: qty 20

## 2018-11-11 MED ORDER — PHENYLEPHRINE HCL 10 MG/ML IJ SOLN
INTRAMUSCULAR | Status: DC | PRN
  Administered 2018-11-11: 100 ug via INTRAVENOUS

## 2018-11-11 MED ORDER — ONDANSETRON HCL 4 MG/2ML IJ SOLN: 4.0000 mg | Freq: Once | INTRAMUSCULAR | Status: DC | PRN

## 2018-11-11 MED ORDER — LIDOCAINE HCL (CARDIAC) PF 100 MG/5ML IV SOSY
PREFILLED_SYRINGE | INTRAVENOUS | Status: DC | PRN
  Administered 2018-11-11: 50 mg via INTRAVENOUS

## 2018-11-11 MED ORDER — SUCCINYLCHOLINE CHLORIDE 20 MG/ML IJ SOLN
INTRAMUSCULAR | Status: DC | PRN
  Administered 2018-11-11: 100 mg via INTRAVENOUS

## 2018-11-11 MED ORDER — METOPROLOL TARTRATE 5 MG/5ML IV SOLN
INTRAVENOUS | Status: AC
  Administered 2018-11-11: 2 mg via INTRAVENOUS
  Filled 2018-11-11: qty 5

## 2018-11-11 MED ORDER — FENTANYL CITRATE (PF) 100 MCG/2ML IJ SOLN
INTRAMUSCULAR | Status: DC | PRN
  Administered 2018-11-11 (×2): 100 ug via INTRAVENOUS

## 2018-11-11 MED ORDER — SODIUM CHLORIDE 0.9 % IV SOLN
INTRAVENOUS | Status: DC | PRN
  Administered 2018-11-11: 250 mL

## 2018-11-11 MED ORDER — FENTANYL CITRATE (PF) 100 MCG/2ML IJ SOLN
INTRAMUSCULAR | Status: AC
  Administered 2018-11-11: 25 ug via INTRAVENOUS
  Filled 2018-11-11: qty 2

## 2018-11-11 MED ORDER — DEXAMETHASONE SODIUM PHOSPHATE 10 MG/ML IJ SOLN
INTRAMUSCULAR | Status: DC | PRN
  Administered 2018-11-11: 5 mg via INTRAVENOUS

## 2018-11-11 MED ORDER — ENOXAPARIN SODIUM 40 MG/0.4ML ~~LOC~~ SOLN
40.0000 mg | SUBCUTANEOUS | Status: DC
  Administered 2018-11-12 – 2018-11-14 (×3): 40 mg via SUBCUTANEOUS
  Filled 2018-11-11 (×3): qty 0.4

## 2018-11-11 MED ORDER — SUCCINYLCHOLINE CHLORIDE 20 MG/ML IJ SOLN
INTRAMUSCULAR | Status: AC
  Filled 2018-11-11: qty 1

## 2018-11-11 MED ORDER — GENTAMICIN SULFATE 40 MG/ML IJ SOLN
INTRAMUSCULAR | Status: AC
  Filled 2018-11-11: qty 2

## 2018-11-11 MED ORDER — ONDANSETRON HCL 4 MG/2ML IJ SOLN
INTRAMUSCULAR | Status: AC
  Filled 2018-11-11: qty 2

## 2018-11-11 MED ORDER — LIDOCAINE HCL (PF) 2 % IJ SOLN
INTRAMUSCULAR | Status: AC
  Filled 2018-11-11: qty 10

## 2018-11-11 MED ORDER — METOPROLOL TARTRATE 5 MG/5ML IV SOLN
2.0000 mg | Freq: Once | INTRAVENOUS | Status: AC
  Administered 2018-11-11: 2 mg via INTRAVENOUS

## 2018-11-11 MED ORDER — GLYCOPYRROLATE 0.2 MG/ML IJ SOLN
INTRAMUSCULAR | Status: DC | PRN
  Administered 2018-11-11: 0.2 mg via INTRAVENOUS

## 2018-11-11 MED ORDER — FENTANYL CITRATE (PF) 100 MCG/2ML IJ SOLN
25.0000 ug | INTRAMUSCULAR | Status: DC | PRN
  Administered 2018-11-11: 25 ug via INTRAVENOUS

## 2018-11-11 MED ORDER — ONDANSETRON HCL 4 MG/2ML IJ SOLN
INTRAMUSCULAR | Status: DC | PRN
  Administered 2018-11-11: 4 mg via INTRAVENOUS

## 2018-11-11 MED ORDER — PROPOFOL 10 MG/ML IV BOLUS
INTRAVENOUS | Status: DC | PRN
  Administered 2018-11-11: 200 mg via INTRAVENOUS
  Administered 2018-11-11: 50 mg via INTRAVENOUS

## 2018-11-11 SURGICAL SUPPLY — 30 items
BANDAGE ELASTIC 3 LF NS (GAUZE/BANDAGES/DRESSINGS) ×3 IMPLANT
BLADE SURG 15 STRL LF DISP TIS (BLADE) ×1 IMPLANT
BLADE SURG 15 STRL SS (BLADE) ×2
BNDG ESMARK 4X12 TAN STRL LF (GAUZE/BANDAGES/DRESSINGS) ×3 IMPLANT
BNDG GAUZE 1X2.1 STRL (MISCELLANEOUS) ×3 IMPLANT
CANISTER SUCT 1200ML W/VALVE (MISCELLANEOUS) ×3 IMPLANT
CAST PADDING 3X4FT ST 30246 (SOFTGOODS) ×2
COVER WAND RF STERILE (DRAPES) ×3 IMPLANT
CUFF TOURN 18 STER (MISCELLANEOUS) ×3 IMPLANT
DRSG DERMACEA 8X12 NADH (GAUZE/BANDAGES/DRESSINGS) ×3 IMPLANT
DURAPREP 26ML APPLICATOR (WOUND CARE) ×3 IMPLANT
ELECT REM PT RETURN 9FT ADLT (ELECTROSURGICAL) ×3
ELECTRODE REM PT RTRN 9FT ADLT (ELECTROSURGICAL) ×1 IMPLANT
GAUZE SPONGE 4X4 12PLY STRL (GAUZE/BANDAGES/DRESSINGS) ×3 IMPLANT
GLOVE BIOGEL M STRL SZ7.5 (GLOVE) ×3 IMPLANT
GOWN STRL REUS W/ TWL LRG LVL3 (GOWN DISPOSABLE) ×2 IMPLANT
GOWN STRL REUS W/TWL LRG LVL3 (GOWN DISPOSABLE) ×4
IV CATH ANGIO 14GX1.88 NO SAFE (IV SOLUTION) ×3 IMPLANT
KIT TURNOVER CYSTO (KITS) ×3 IMPLANT
NDL HPO THNWL 1X22GA REG BVL (NEEDLE) ×1 IMPLANT
NEEDLE SAFETY 22GX1 (NEEDLE) ×2
NS IRRIG 500ML POUR BTL (IV SOLUTION) ×3 IMPLANT
PACK EXTREMITY ARMC (MISCELLANEOUS) ×3 IMPLANT
PAD CAST CTTN 3X4 STRL (SOFTGOODS) ×1 IMPLANT
PAD PREP 24X41 OB/GYN DISP (PERSONAL CARE ITEMS) ×3 IMPLANT
STOCKINETTE STRL 4IN 9604848 (GAUZE/BANDAGES/DRESSINGS) ×3 IMPLANT
SUT ETHILON 3-0 FS-10 30 BLK (SUTURE) ×3
SUT ETHILON 4 0 P 3 18 (SUTURE) ×3 IMPLANT
SUTURE EHLN 3-0 FS-10 30 BLK (SUTURE) ×1 IMPLANT
SWAB CULTURE AMIES ANAERIB BLU (MISCELLANEOUS) ×3 IMPLANT

## 2018-11-11 NOTE — Anesthesia Post-op Follow-up Note (Signed)
Anesthesia QCDR form completed.        

## 2018-11-11 NOTE — Evaluation (Signed)
Occupational Therapy Evaluation Patient Details Name: Lance ProwsMichael L Beckum MRN: [id number] DOB: [birth year] Today's Date: 11/11/2018    History of Present Illness 43yo male s/p incision, irrigation, and debridement of the left long finger with irrigation of the flexor tendon sheath.    Clinical Impression   Pt seen for OT evaluation this date. Prior to hospital admission, pt was independent, working in construction/concrete work involving heavy lifting. Pt lives with a friend and notes he has a fiance. Pt currently demonstrates impairments in sensation (hx chronic peripheral neuropathy in B feet and B hands), ROM/strength in L hand, and impaired UE functional use requiring PRN assist for ADL and modified indep for functional mobility with no AD. Pt instructed in LUE AROM ex, elevation/positioning of LUE for safety and edema control, safety measures to keep LUE dry to minimize infection, and home/routines modifications to maximize safety/independence. Pt verbalized and demo'd understanding. Pt would benefit from skilled OP OT to address noted impairments and functional limitations (see below for any additional details) once appropriate per MD, in order to maximize safety and independence while minimizing falls risk and caregiver burden.    Follow Up Recommendations  Outpatient OT(hand therapy once appropriate, per MD)    Equipment Recommendations  None recommended by OT    Recommendations for Other Services       Precautions / Restrictions Precautions Precautions: None Restrictions Weight Bearing Restrictions: (no weightbearing orders noted, assumed NWBing to LUE distal of elbow)      Mobility Bed Mobility Overal bed mobility: Modified Independent                Transfers Overall transfer level: Modified independent                    Balance Overall balance assessment: No apparent balance deficits (not formally assessed)                                          ADL either performed or assessed with clinical judgement   ADL Overall ADL's : Modified independent                                       General ADL Comments: Pt able to perform ADL and functional mobility for ADL with modified independence, protecting LUE well when ambulating, and able to modify bilat UE tasks with emphasis on RUE use     Vision Patient Visual Report: No change from baseline       Perception     Praxis      Pertinent Vitals/Pain Pain Assessment: No/denies pain     Hand Dominance Right   Extremity/Trunk Assessment Upper Extremity Assessment Upper Extremity Assessment: LUE deficits/detail(RUW WFL) LUE Deficits / Details: pt able to move wrist and all fingers actively, ROM limited by bulky bandaging, denies sensory deficits aside from chronic neuropathy, shoulder/elbow WFL LUE: Unable to fully assess due to immobilization LUE Sensation: history of peripheral neuropathy LUE Coordination: decreased fine motor   Lower Extremity Assessment Lower Extremity Assessment: Overall WFL for tasks assessed   Cervical / Trunk Assessment Cervical / Trunk Assessment: Normal   Communication Communication Communication: No difficulties   Cognition Arousal/Alertness: Awake/alert Behavior During Therapy: WFL for tasks assessed/performed Overall Cognitive Status: Within Functional Limits for tasks assessed  General Comments  L hand dry and bandaged and elevated at start and end of session    Exercises Other Exercises Other Exercises: pt instructed in LUE AROM ex, elevation/positioning of LUE for safety and edema control, and safety measures to keep LUE dry to minimize infection. Pt verbalized and demo'd understanding.   Shoulder Instructions      Home Living Family/patient expects to be discharged to:: Private residence Living Arrangements: Non-relatives/Friends Available Help at  Discharge: Family;Friend(s);Available PRN/intermittently Type of Home: Apartment Home Access: Stairs to enter Entrance Stairs-Number of Steps: 5-7 Entrance Stairs-Rails: None Home Layout: One level     Bathroom Shower/Tub: Chief Strategy Officer: Standard     Home Equipment: None          Prior Functioning/Environment Level of Independence: Independent        Comments: Pt independent and working in construction/concrete work involving heavy lifting        OT Problem List: Decreased strength;Decreased range of motion;Impaired UE functional use;Impaired sensation      OT Treatment/Interventions:      OT Goals(Current goals can be found in the care plan section) Acute Rehab OT Goals Patient Stated Goal: to go home and recover quickly so he can return to work OT Goal Formulation: All assessment and education complete, DC therapy  OT Frequency:     Barriers to D/C:            Co-evaluation              AM-PAC OT "6 Clicks" Daily Activity     Outcome Measure Help from another person eating meals?: None Help from another person taking care of personal grooming?: None Help from another person toileting, which includes using toliet, bedpan, or urinal?: None Help from another person bathing (including washing, rinsing, drying)?: None Help from another person to put on and taking off regular upper body clothing?: None Help from another person to put on and taking off regular lower body clothing?: None 6 Click Score: 24   End of Session Equipment Utilized During Treatment: Gait belt  Activity Tolerance: Patient tolerated treatment well Patient left: in bed;with bed alarm set;with family/visitor present  OT Visit Diagnosis: Other abnormalities of gait and mobility (R26.89)                Time: 1345-1410 OT Time Calculation (min): 25 min Charges:  OT General Charges $OT Visit: 1 Visit OT Evaluation $OT Eval Low Complexity: 1 Low OT  Treatments $Therapeutic Activity: 8-22 mins  Richrd Prime, MPH, MS, OTR/L ascom [phone] 11/11/18, 2:19 PM

## 2018-11-11 NOTE — Transfer of Care (Signed)
Immediate Anesthesia Transfer of Care Note  Patient: Lance Green  Procedure(s) Performed: INCISION AND DRAINAGE (Left )  Patient Location: PACU  Anesthesia Type:General  Level of Consciousness: sedated  Airway & Oxygen Therapy: Patient Spontanous Breathing and Patient connected to face mask oxygen  Post-op Assessment: Report given to RN and Post -op Vital signs reviewed and stable  Post vital signs: Reviewed and stable  Last Vitals:  Vitals Value Taken Time  BP    Temp    Pulse    Resp    SpO2      Last Pain:  Vitals:   11/11/18 0428  TempSrc: Oral  PainSc:          Complications: No apparent anesthesia complications

## 2018-11-11 NOTE — Anesthesia Postprocedure Evaluation (Signed)
Anesthesia Post Note  Patient: Lance Green  Procedure(s) Performed: INCISION AND DRAINAGE (Left )  Patient location during evaluation: PACU Anesthesia Type: General Level of consciousness: awake and alert Pain management: pain level controlled Vital Signs Assessment: post-procedure vital signs reviewed and stable Respiratory status: spontaneous breathing and respiratory function stable Cardiovascular status: stable Anesthetic complications: no     Last Vitals:  Vitals:   11/11/18 0600 11/11/18 0743  BP:  136/90  Pulse: (!) 0 95  Resp:  15  Temp:  36.8 C  SpO2:  100%    Last Pain:  Vitals:   11/11/18 0743  TempSrc:   PainSc: Asleep                 KEPHART,WILLIAM K

## 2018-11-11 NOTE — Care Management (Signed)
Met with Patient to determine needs, patient has no insurance and may not be able to afford medication, provided the patient with [facility] application, I explained how they both work and the location. Patient took the application for both and will get filled out

## 2018-11-11 NOTE — Anesthesia Procedure Notes (Signed)
Procedure Name: Intubation Performed by: Terrian Sentell, CRNA Pre-anesthesia Checklist: Patient identified, Patient being monitored, Timeout performed, Emergency Drugs available and Suction available Patient Re-evaluated:Patient Re-evaluated prior to induction Oxygen Delivery Method: Circle system utilized Preoxygenation: Pre-oxygenation with 100% oxygen Induction Type: IV induction and Rapid sequence Laryngoscope Size: Miller and 2 Grade View: Grade I Tube type: Oral Tube size: 7.5 mm Number of attempts: 1 Airway Equipment and Method: Stylet Placement Confirmation: ETT inserted through vocal cords under direct vision,  positive ETCO2 and breath sounds checked- equal and bilateral Secured at: 22 cm Tube secured with: Tape Dental Injury: Teeth and Oropharynx as per pre-operative assessment        

## 2018-11-11 NOTE — Op Note (Signed)
OPERATIVE NOTE  DATE OF SURGERY:  11/11/2018  PATIENT NAME:  JIAAN GAEDE   DOB: Oct 01, 1976  MRN: [id number]   PRE-OPERATIVE DIAGNOSIS: Assess of the left long finger  POST-OPERATIVE DIAGNOSIS: Abscess of the left long finger with purulent tenosynovitis  PROCEDURE: Incision, irrigation, and debridement of the left long finger with irrigation of the flexor tendon sheath  SURGEON:  Jena Gauss., M.D.   ASSISTANT: None  ANESTHESIA: general  ESTIMATED BLOOD LOSS: 2 mL  FLUIDS REPLACED: 500 mL of crystalloid  TOURNIQUET TIME: Next he 5  INDICATIONS FOR SURGERY: HADRIEN MILEWSKI is a 43 y.o. year old right-hand-dominant male who has been seen for complaints of swelling and drainage from the left long finger.  He had previously pierced the site and subsequently cut the site with a razor to "relieve the pressure". After discussion of the risks and benefits of surgical intervention, the patient expressed understanding of the risks benefits and agree with plans for incision, irrigation, and debridement of the left long finger.   PROCEDURE IN DETAIL: The patient was brought into the operating room and, after adequate general endotracheal anesthesia was achieved, tourniquet was placed on the patient's upper left arm.  The patient's left hand and arm were cleaned prepped with Betadine and draped in usual sterile fashion.  A "timeout" was performed as per usual protocol.  The left upper extremity was elevated to exsanguinate the limb and the tourniquet was inflated to 250 mmHg.  Loupe magnification was used throughout the procedure.  A zigzag incision was made incorporating the previous laceration site so as to visualize both the distal and middle phalanx.  Gross purulent material was encountered and swabs were submitted for stat Gram stain, culture, and sensitivity.  Some necrotic tissue was noted and this was debrided using tenotomy scissors.  Care was taken to protect the neurovascular  bundles.  Inspection of the base of the the wound showed that the tendon sheath had been compromised by the infection.  It was thus elected to perform an irrigation of the tendon sheath.  A transverse incision was made just proximal to the A1 pulley of the long finger.  A 12-gauge Angiocath catheter was inserted and copious amounts of normal saline with antibiotic solution was used to flush the tendon sheath from proximal to distal.  Only, copious amounts of irrigation were used for the distal wound site.  The palmar incision was reapproximated using #3-0 nylon.  The zigzag incision was loosely reapproximated using interrupted sutures of #3-0 nylon.  A bulky hand dressing was applied.  Tourniquet was deflated after total tourniquet time of 65 minutes.  The patient tolerated the procedure well.  He was transported to the recovery room stable condition.   Theressa Piedra P. Angie Fava M.D.

## 2018-11-11 NOTE — Plan of Care (Signed)
  Problem: Education: Goal: Knowledge of General Education information will improve Description Including pain rating scale, medication(s)/side effects and non-pharmacologic comfort measures Outcome: Progressing   Problem: Health Behavior/Discharge Planning: Goal: Ability to manage health-related needs will improve Outcome: Progressing   Problem: Clinical Measurements: Goal: Ability to maintain clinical measurements within normal limits will improve Outcome: Progressing Goal: Will remain free from infection Outcome: Progressing Goal: Diagnostic test results will improve Outcome: Progressing Goal: Respiratory complications will improve Outcome: Progressing Goal: Cardiovascular complication will be avoided Outcome: Progressing   Problem: Nutrition: Goal: Adequate nutrition will be maintained Outcome: Progressing   Problem: Coping: Goal: Level of anxiety will decrease Outcome: Progressing   Problem: Elimination: Goal: Will not experience complications related to urinary retention Outcome: Progressing   Problem: Skin Integrity: Goal: Risk for impaired skin integrity will decrease Outcome: Progressing   Problem: Education: Goal: Required Educational Video(s) Outcome: Progressing   Problem: Clinical Measurements: Goal: Postoperative complications will be avoided or minimized Outcome: Progressing   Problem: Skin Integrity: Goal: Demonstration of wound healing without infection will improve Outcome: Progressing

## 2018-11-11 NOTE — Anesthesia Preprocedure Evaluation (Signed)
Anesthesia Evaluation  Patient identified by MRN, date of birth, ID band Patient awake    Reviewed: Allergy & Precautions, NPO status , Patient's Chart, lab work & pertinent test results, reviewed documented beta blocker date and time   Airway Mallampati: II  TM Distance: >3 FB     Dental  (+) Chipped, Missing, Poor Dentition   Pulmonary Current Smoker,           Cardiovascular      Neuro/Psych    GI/Hepatic PUD,   Endo/Other    Renal/GU      Musculoskeletal   Abdominal   Peds  Hematology   Anesthesia Other Findings ETOH. Alc level 256 last night. Ate 6:30 last night. Case postponed until this am. EKG ok from 1 y ago.  Reproductive/Obstetrics                             Anesthesia Physical Anesthesia Plan  ASA: III  Anesthesia Plan: General   Post-op Pain Management:    Induction: Intravenous  PONV Risk Score and Plan:   Airway Management Planned: Oral ETT  Additional Equipment:   Intra-op Plan:   Post-operative Plan:   Informed Consent: I have reviewed the patients History and Physical, chart, labs and discussed the procedure including the risks, benefits and alternatives for the proposed anesthesia with the patient or authorized representative who has indicated his/her understanding and acceptance.       Plan Discussed with: CRNA  Anesthesia Plan Comments:         Anesthesia Quick Evaluation

## 2018-11-11 NOTE — Plan of Care (Signed)
°  Problem: Coping: °Goal: Level of anxiety will decrease °Outcome: Progressing °  °

## 2018-11-11 NOTE — Progress Notes (Signed)
Dr. Henrene Hawking notified that BP has decreased and HR has decreased after 2mg  Metoprolol. Dr. Henrene Hawking stated pt could now go back to room 137. Damyah Gugel E 8:54 AM 11/11/2018

## 2018-11-11 NOTE — Progress Notes (Signed)
Dr. Henrene Hawking notified of pt's elevated BP. Pt just got up to have a BM. Pt have some pain and given pain medication. Per Dr. Henrene Hawking will see if these help BP come down. Acknowledged. No new orders. Zanaya Baize E 8:21 AM 11/11/2018

## 2018-11-11 NOTE — Progress Notes (Signed)
Pt BP continues elevated. HR elevated also. Dr. Henrene Hawking notified. Acknowledged. See orders. Hermila Millis E 8:35 AM 11/11/2018

## 2018-11-12 ENCOUNTER — Encounter: Payer: Self-pay | Admitting: Orthopedic Surgery

## 2018-11-12 DIAGNOSIS — L089 Local infection of the skin and subcutaneous tissue, unspecified: Secondary | ICD-10-CM

## 2018-11-12 DIAGNOSIS — F17210 Nicotine dependence, cigarettes, uncomplicated: Secondary | ICD-10-CM

## 2018-11-12 DIAGNOSIS — Z8719 Personal history of other diseases of the digestive system: Secondary | ICD-10-CM

## 2018-11-12 DIAGNOSIS — Z7289 Other problems related to lifestyle: Secondary | ICD-10-CM

## 2018-11-12 DIAGNOSIS — G629 Polyneuropathy, unspecified: Secondary | ICD-10-CM

## 2018-11-12 DIAGNOSIS — K0889 Other specified disorders of teeth and supporting structures: Secondary | ICD-10-CM

## 2018-11-12 LAB — CBC WITH DIFFERENTIAL/PLATELET
Abs Immature Granulocytes: 0.08 10*3/uL — ABNORMAL HIGH (ref 0.00–0.07)
BASOS ABS: 0 10*3/uL (ref 0.0–0.1)
Basophils Relative: 0 %
Eosinophils Absolute: 0 10*3/uL (ref 0.0–0.5)
Eosinophils Relative: 0 %
HCT: 37.3 % — ABNORMAL LOW (ref 39.0–52.0)
Hemoglobin: 12.9 g/dL — ABNORMAL LOW (ref 13.0–17.0)
Immature Granulocytes: 1 %
Lymphocytes Relative: 17 %
Lymphs Abs: 2.4 10*3/uL (ref 0.7–4.0)
MCH: 31.2 pg (ref 26.0–34.0)
MCHC: 34.6 g/dL (ref 30.0–36.0)
MCV: 90.1 fL (ref 80.0–100.0)
Monocytes Absolute: 1.3 10*3/uL — ABNORMAL HIGH (ref 0.1–1.0)
Monocytes Relative: 9 %
Neutro Abs: 10.5 10*3/uL — ABNORMAL HIGH (ref 1.7–7.7)
Neutrophils Relative %: 73 %
Platelets: 206 10*3/uL (ref 150–400)
RBC: 4.14 MIL/uL — AB (ref 4.22–5.81)
RDW: 12.3 % (ref 11.5–15.5)
WBC: 14.2 10*3/uL — AB (ref 4.0–10.5)
nRBC: 0 % (ref 0.0–0.2)

## 2018-11-12 MED ORDER — SODIUM CHLORIDE 0.9 % IV SOLN
INTRAVENOUS | Status: DC | PRN
  Administered 2018-11-12: 1000 mL via INTRAVENOUS

## 2018-11-12 MED ORDER — PIPERACILLIN-TAZOBACTAM 3.375 G IVPB 30 MIN: 3.3750 g | Freq: Three times a day (TID) | INTRAVENOUS | Status: DC

## 2018-11-12 MED ORDER — PIPERACILLIN-TAZOBACTAM 3.375 G IVPB
3.3750 g | Freq: Three times a day (TID) | INTRAVENOUS | Status: DC
  Administered 2018-11-12 – 2018-11-13 (×4): 3.375 g via INTRAVENOUS
  Filled 2018-11-12 (×4): qty 50

## 2018-11-12 NOTE — Care Management (Addendum)
Patient currently on IV antibiotics (Unasyn).  RNCM will reach out to Medication Management when treatment plan has been finalized. Patient was referred to [facility] and [facility]ication Management in October 2019.  RNCM checking this status.  Medication Management is closed on weekend.  RNCM to follow.  Marylene Land [phone].  Update: patient never followed up with Medication Management from last visit.  Update from Surgeon: ID consult unsure of plan for antibiotic at home yet. RNCM to follow for transportation needs and MATCH.  Patient said he could not go to his mothers home at discharge.

## 2018-11-12 NOTE — Progress Notes (Signed)
  Subjective: 1 Day Post-Op Procedure(s) (LRB): INCISION AND DRAINAGE (Left) Patient reports pain as moderate.   Patient seen in rounds with Dr. Ernest PineHooten. Patient is well, and has had no acute complaints or problems Plan is to go Home after hospital stay.  He is continuing IV antibiotics. Negative for chest pain and shortness of breath Fever: no Gastrointestinal: Negative for nausea and vomiting  Objective: Vital signs in last 24 hours: Temp:  [97.5 F (36.4 C)-98.3 F (36.8 C)] 98.1 F (36.7 C) (02/06 2322) Pulse Rate:  [61-99] 61 (02/06 2322) Resp:  [10-20] 20 (02/06 2322) BP: (136-185)/(87-113) 157/87 (02/06 2322) SpO2:  [97 %-100 %] 98 % (02/06 2322)  Intake/Output from previous day:  Intake/Output Summary (Last 24 hours) at 11/12/2018 0723 Last data filed at 11/11/2018 1700 Gross per 24 hour  Intake 1680 ml  Output 402 ml  Net 1278 ml    Intake/Output this shift: No intake/output data recorded.  Labs: Recent Labs    11/10/18 1636 11/11/18 1029 11/12/18 0344  HGB 13.9 13.5 12.9*   Recent Labs    11/11/18 1029 11/12/18 0344  WBC 12.4* 14.2*  RBC 4.27 4.14*  HCT 39.1 37.3*  PLT 217 206   Recent Labs    11/10/18 1636  NA 140  K 3.7  CL 107  CO2 24  BUN 5*  CREATININE 0.57*  GLUCOSE 94  CALCIUM 9.3   No results for input(s): LABPT, INR in the last 72 hours.   EXAM General - Patient is Alert and Oriented Extremity - Sensation limited based on neuropathy.  Good skin warmth. Dressing/Incision - clean, dry, blood tinged drainage.  The surgical wrap was removed with very minimal drainage.  Xeroform is intact as well as new bandages. Motor Function - intact, moving fingers gently on exam.   Past Medical History:  Diagnosis Date  . Neuropathy   . Pancreatitis   . Peptic ulcer disease     Assessment/Plan: 1 Day Post-Op Procedure(s) (LRB): INCISION AND DRAINAGE (Left) Active Problems:   Abscess of left middle finger  Estimated body mass index is  22.15 kg/m as calculated from the following:   Height as of this encounter: 5\' 9"  (1.753 m).   Weight as of this encounter: 68 kg. Advance diet. Continue IV antibiotics.  Awaiting culture sensitivity. Discharge home when medically stable. White blood cell count 14.2 and elevated from yesterday.  DVT Prophylaxis - Lovenox   Dedra Skeensodd Kingdom Vanzanten, PA-C Orthopaedic Surgery 11/12/2018, 7:23 AM

## 2018-11-12 NOTE — Progress Notes (Signed)
Took over care for patient at 1500.

## 2018-11-12 NOTE — Progress Notes (Signed)
Pharmacy Antibiotic Note  Lance Green is a 43 y.o. male admitted on 11/10/2018 with orthopedic hand surgery.  Pharmacy has been consulted for vancomycin dosing.  Plan: DW 68 kg  Vd 44L Ki 0.098 hr-1  T1/2 7 hours  Vancomycin 1000 mg IV Q 12 hrs. Goal AUC 400-550. Expected AUC: 460 SCr used: 0.8  Continue this dosing - de-escalation pending wound cultures per Orthopedic note  Height: 5\' 9"  (175.3 cm) Weight: 150 lb (68 kg) IBW/kg (Calculated) : 70.7  Temp (24hrs), Avg:97.8 F (36.6 C), Min:97.5 F (36.4 C), Max:98.1 F (36.7 C)  Recent Labs  Lab 11/10/18 1636 11/10/18 1953 11/11/18 1029 11/12/18 0344  WBC 12.3*  --  12.4* 14.2*  CREATININE 0.57*  --   --   --   LATICACIDVEN  --  1.3  --   --     Estimated Creatinine Clearance: 113.3 mL/min (A) (by C-G formula based on SCr of 0.57 mg/dL (L)).    No Known Allergies  Antimicrobials this admission: Vancomycin 2/5 >>  Unasyn 2/5 >>      Dose adjustments this admission: Checking Scr 2/8 and will adjust if needed - if continuing, will draw peak/trough on day 5 and plan accordingly  Microbiology results: 2/6 Surgical/Deep Wound - pending  Thank you for allowing pharmacy to be a part of this patient's care.  Albina Billet, PharmD, BCPS Clinical Pharmacist 11/12/2018 11:05 AM

## 2018-11-12 NOTE — Care Management (Signed)
MATCH voucher for medication assistance placed on patient's chart if he discharges to home over weekend.  If he discharges on Monday- RNCM to work out antibiotic with Medication Management.

## 2018-11-12 NOTE — Consult Note (Signed)
NAME: Lance Green  DOB: Jun 20, 1976  MRN: [id number]  Date/Time: 11/12/2018 3:48 PM  REQUESTING PROVIDER: Dr.Hooten Subjective:  REASON FOR CONSULT: s/p I&D of left long finger; recommendations for oral antibiotics ? 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 Then he says it was turning green and he was concerned and called EMS and came to the ED on 11/10/18. In the ED his vitals were stable, wbc was 12.3 , started on vanco and unasyn.Was seen by surgeon Dr.Hooten and had  Incision, irrigation, and debridement of the left long finger with irrigation of the flexor tendon sheath on 11/11/18. Purulent material seen. Sent for cultures and I am asked to see the patient for oral antibiotic recommendation. As per patient he has no sensation in his finger tips and thinks he may have injured the finger but not sure how and when. He works at [employer], clinical- He was cutting hedges, pulling shrubs, cleaning handicapped ladies homes , washing dishes. He says he uses Gloves all the time. 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 He denies fishing, cleaning fish, getting stuck by any rose thorn, or putting his hands in mud or swamp.No animal bite, no biting his nails He last went to work on 11/08/18.  He drinks 2X 6 pack of beer a day. Smokes cigarettes No illicit drug use  Past Medical History:  Diagnosis Date  . Neuropathy   . Pancreatitis   . Peptic ulcer disease     Past Surgical History:  Procedure Laterality Date  .  APPENDECTOMY    . INCISION AND DRAINAGE Left 11/11/2018   Procedure: INCISION AND DRAINAGE;  Surgeon: Donato Heinz, MD;  Location: [facility] ORS;  Service: Orthopedics;  Laterality: Left;  . LAPAROSCOPIC APPENDECTOMY N/A 07/06/2018   Procedure: APPENDECTOMY LAPAROSCOPIC;  Surgeon: Carolan Shiver, MD;  Location: [facility] ORS;  Service: General;  Laterality: N/A;     SH Smoker Heavy alcohol use -says it has slowed down than before No illicit drug use   Family History: mom had breast cancer Grandmother Diabetes ? Current Facility-Administered Medications  Medication Dose Route Frequency Provider Last Rate Last Dose  . 0.9 %  sodium chloride infusion   Intravenous Continuous Lance Green, Illene Labrador, MD 100 mL/hr at 11/12/18 0303    . Ampicillin-Sulbactam (UNASYN) 3 g in sodium chloride 0.9 % 100 mL IVPB  3 g Intravenous Q6H Lance Green, Illene Labrador, MD 200 mL/hr at 11/12/18 1035 3 g at 11/12/18 1035  . bisacodyl (DULCOLAX) EC tablet 5 mg  5 mg Oral Daily PRN Lance Green, Illene Labrador, MD      . enoxaparin (LOVENOX) injection 40 mg  40 mg Subcutaneous Q24H Lance Green, Illene Labrador, MD   40 mg at 11/12/18 1036  . folic acid (FOLVITE) tablet 1 mg  1 mg Oral Daily Lance Green, Illene Labrador, MD   1 mg at 11/12/18 1036  . HYDROmorphone (DILAUDID) injection 0.5-1 mg  0.5-1 mg Intravenous Q2H PRN Lance Green, Illene Labrador, MD      .  LORazepam (ATIVAN) tablet 1 mg  1 mg Oral Q6H PRN Lance Green, Illene LabradorJames P, MD       Or  . LORazepam (ATIVAN) injection 1 mg  1 mg Intravenous Q6H PRN Lance Green, Illene LabradorJames P, MD      . magnesium citrate solution 1 Bottle  1 Bottle Oral Once PRN Lance Green, Illene LabradorJames P, MD      . magnesium hydroxide (MILK OF MAGNESIA) suspension 30 mL  30 mL Oral Daily PRN Lance Green, Illene LabradorJames P, MD      . multivitamin with minerals tablet 1 tablet  1 tablet Oral Daily Lance Green, Illene LabradorJames P, MD   1 tablet at 11/12/18 1000  . multivitamin with minerals tablet 1 tablet  1 tablet Oral Daily Lance Green, Illene LabradorJames P, MD   1 tablet at 11/12/18 1036  . ondansetron (ZOFRAN) tablet 4 mg  4 mg Oral  Q6H PRN Lance Green, Illene LabradorJames P, MD       Or  . ondansetron (ZOFRAN) injection 4 mg  4 mg Intravenous Q6H PRN Lance Green, Illene LabradorJames P, MD      . oxyCODONE (Oxy IR/ROXICODONE) immediate release tablet 5-10 mg  5-10 mg Oral Q3H PRN Donato HeinzHooten, James P, MD   10 mg at 11/11/18 2144  . pantoprazole (PROTONIX) EC tablet 40 mg  40 mg Oral BID Donato HeinzHooten, James P, MD   40 mg at 11/12/18 1036  . senna (SENOKOT) tablet 8.6 mg  1 tablet Oral BID Lance Green, Illene LabradorJames P, MD   8.6 mg at 11/12/18 1036  . thiamine (VITAMIN B-1) tablet 100 mg  100 mg Oral Daily Lance Green, Illene LabradorJames P, MD   100 mg at 11/12/18 1036   Or  . thiamine (B-1) injection 100 mg  100 mg Intravenous Daily Donato HeinzHooten, James P, MD   100 mg at 11/10/18 2215  . vancomycin (VANCOCIN) IVPB 1000 mg/200 mL premix  1,000 mg Intravenous Q12H Lance Green, Illene LabradorJames P, MD 200 mL/hr at 11/12/18 0651 1,000 mg at 11/12/18 0651  . vitamin C (ASCORBIC ACID) tablet 1,000 mg  1,000 mg Oral Daily Lance Green, Illene LabradorJames P, MD   1,000 mg at 11/12/18 1036     Abtx:  Anti-infectives (From admission, onward)   Start     Dose/Rate Route Frequency Ordered Stop   11/11/18 0717  gentamicin (GARAMYCIN) 80 mg in sodium chloride 0.9 % 500 mL irrigation  Status:  Discontinued       As needed 11/11/18 0718 11/11/18 0740   11/11/18 0600  vancomycin (VANCOCIN) IVPB 1000 mg/200 mL premix     1,000 mg 200 mL/hr over 60 Minutes Intravenous Every 12 hours 11/10/18 2233     11/10/18 2215  Ampicillin-Sulbactam (UNASYN) 3 g in sodium chloride 0.9 % 100 mL IVPB     3 g 200 mL/hr over 30 Minutes Intravenous Every 6 hours 11/10/18 2213     11/10/18 1600  Ampicillin-Sulbactam (UNASYN) 3 g in sodium chloride 0.9 % 100 mL IVPB     3 g 200 mL/hr over 30 Minutes Intravenous  Once 11/10/18 1558 11/10/18 1711   11/10/18 1600  vancomycin (VANCOCIN) IVPB 1000 mg/200 mL premix     1,000 mg 200 mL/hr over 60 Minutes Intravenous  Once 11/10/18 1558 11/10/18 1814      REVIEW OF SYSTEMS:  Const: negative fever, negative chills, negative weight  loss Eyes: negative diplopia or visual changes, negative eye pain ENT: negative coryza, negative sore throat Resp: negative cough, hemoptysis, dyspnea Cards: negative for chest pain, palpitations, lower extremity edema GU: negative for frequency, dysuria and hematuria  GI: Negative for abdominal pain, diarrhea, bleeding, constipation Skin: negative for rash and pruritus Heme: negative for easy bruising and gum/nose bleeding MS: negative for myalgias, arthralgias, back pain and muscle weakness Neurolo:negative for headaches, dizziness, vertigo, memory problems  Psych: negative for feelings of anxiety, depression  Endocrine:no polyuria or polydipsia  Allergy/Immunology- negative for any medication or food allergies  Objective:  VITALS:  BP (!) 157/87 (BP Location: Right Arm)   Pulse 61   Temp 98.1 F (36.7 C) (Oral)   Resp 20   Ht 5\' 9"  (1.753 m)   Wt 68 kg   SpO2 98%   BMI 22.15 kg/m  PHYSICAL EXAM:  General: Alert, cooperative, no distress, appears stated age.  Head: Normocephalic, without obvious abnormality, atraumatic. Eyes: Conjunctivae clear, anicteric sclerae. Pupils are equal Discoloration around eyes ( says it is due to allergies) ENT Nares normal. No drainage or sinus tenderness. Lips, mucosa, and tongue normal. No Thrush Poor dentition  Neck: Supple, symmetrical, no adenopathy, thyroid: non tender no carotid bruit and no JVD. Back: No CVA tenderness. Lungs: Clear to auscultation bilaterally. No Wheezing or Rhonchi. No rales. Heart: Regular rate and rhythm, no murmur, rub or gallop. Abdomen: Soft, non-tender,not distended. Bowel sounds normal. No masses, lap scar Extremities: left hand dressing- not removed Skin: No rashes or lesions. Or bruising, discoloration of the skin over th efingers, nails dystrophic on the rt  Lymph: Cervical, supraclavicular normal. Neurologic: decreased sensation to touch and pain over hands and feet( glove and stocking)  Pertinent  Labs Lab Results CBC    Component Value Date/Time   WBC 14.2 (H) 11/12/2018 0344   RBC 4.14 (L) 11/12/2018 0344   HGB 12.9 (L) 11/12/2018 0344   HGB 13.4 01/18/2014 1606   HCT 37.3 (L) 11/12/2018 0344   HCT 40.4 01/18/2014 1606   PLT 206 11/12/2018 0344   PLT 122 (L) 01/18/2014 1606   MCV 90.1 11/12/2018 0344   MCV 93 01/18/2014 1606   MCH 31.2 11/12/2018 0344   MCHC 34.6 11/12/2018 0344   RDW 12.3 11/12/2018 0344   RDW 13.9 01/18/2014 1606   LYMPHSABS 2.4 11/12/2018 0344   MONOABS 1.3 (H) 11/12/2018 0344   EOSABS 0.0 11/12/2018 0344   BASOSABS 0.0 11/12/2018 0344    CMP Latest Ref Rng & Units 11/10/2018 08/07/2018 07/06/2018  Glucose 70 - 99 mg/dL 94 99 161(W)  BUN 6 - 20 mg/dL 5(L) <9(U) 8  Creatinine 0.61 - 1.24 mg/dL 0.45(W) 0.98 1.19(J)  Sodium 135 - 145 mmol/L 140 142 139  Potassium 3.5 - 5.1 mmol/L 3.7 4.8 3.8  Chloride 98 - 111 mmol/L 107 105 102  CO2 22 - 32 mmol/L 24 23 21(L)  Calcium 8.9 - 10.3 mg/dL 9.3 8.9 9.0  Total Protein 6.5 - 8.1 g/dL 8.3(H) - 8.0  Total Bilirubin 0.3 - 1.2 mg/dL 0.4 - 0.8  Alkaline Phos 38 - 126 U/L 114 - 127(H)  AST 15 - 41 U/L 32 - 45(H)  ALT 0 - 44 U/L 16 - 20      Microbiology: Recent Results (from the past 240 hour(s))  Surgical pcr screen     Status: None   Collection Time: 11/11/18  4:11 AM  Result Value Ref Range Status   MRSA, PCR NEGATIVE NEGATIVE Final   Staphylococcus aureus NEGATIVE NEGATIVE Final    Comment: (NOTE) The Xpert SA Assay (FDA approved for NASAL specimens in patients 51 years of age and older), is one component of a comprehensive surveillance program. It  is not intended to diagnose infection nor to guide or monitor treatment. Performed at [facility], [address] [place], [place] [address]   Aerobic/Anaerobic Culture (surgical/deep wound)     Status: None (Preliminary result)   Collection Time: 11/11/18  6:24 AM  Result Value Ref Range Status   Specimen Description   Final     WOUND Performed at [facility], [address], [place], [place] [address]    Special Requests   Final    NONE Performed at [facility], [address] [place], [place] [address]    Gram Stain   Final    ABUNDANT WBC PRESENT, PREDOMINANTLY PMN MODERATE GRAM POSITIVE COCCI FEW GRAM NEGATIVE RODS    Culture   Final    FEW VIRIDANS STREPTOCOCCUS CULTURE REINCUBATED FOR BETTER GROWTH Performed at [facility], [address], [place], [place] [address]    Report Status PENDING  Incomplete    IMAGING RESULTS:  I have personally reviewed the films Soft tissue swelling ? 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 Then he says it was turning green and he was concerned and called EMS and came to the ED on 11/10/18.  Infection of the left middle finger -s/p I/D - on vanco and unasyn- WBC going up and Dr.Hooten is concerned about the worsening leucocytosis- Culture still pending- prelim- strep viridans and gram neg rods Pt is a Copywriter, advertising dealing in [employer] and is hence exposed to bacteria in vegetation and water- apart from regular organisms like staph and strep ,  Gram neg rods like pseudomonas, edwardsiella, aeromonas are all in the mix. Nocardia and sporothrix are less likely but possible. Vibrio vulnificus unlikely as no risk. Will change the unasyn to zosyn for better gram negative coverage. With no picture or wound inspection and culture still pending , unable to recommend home oral antibiotics currently-   ?It is prudent to continue IV until WBC starts  to trend down as he is at risk for amputation.  Discussed with Dr.Hooten and he agrees with the plan. I am not on call this weekend but once the culture is available can help with the choice of oral antibiotic . Please take HAIKU picture of the finger when dressing changed   Excess alcohol use  Peripheral neuropathy- may be related to alcohol  H/o pancreatitis ___________________________________________________ Discussed with patient,and Dr.Hooten

## 2018-11-13 LAB — CBC
HCT: 37.2 % — ABNORMAL LOW (ref 39.0–52.0)
Hemoglobin: 13 g/dL (ref 13.0–17.0)
MCH: 31 pg (ref 26.0–34.0)
MCHC: 34.9 g/dL (ref 30.0–36.0)
MCV: 88.8 fL (ref 80.0–100.0)
Platelets: 205 10*3/uL (ref 150–400)
RBC: 4.19 MIL/uL — ABNORMAL LOW (ref 4.22–5.81)
RDW: 12.5 % (ref 11.5–15.5)
WBC: 7.7 10*3/uL (ref 4.0–10.5)
nRBC: 0 % (ref 0.0–0.2)

## 2018-11-13 LAB — VANCOMYCIN, TROUGH: Vancomycin Tr: 12 ug/mL — ABNORMAL LOW (ref 15–20)

## 2018-11-13 LAB — CREATININE, SERUM
CREATININE: 1.01 mg/dL (ref 0.61–1.24)
GFR calc non Af Amer: >60 mL/min (ref >60)

## 2018-11-13 LAB — VANCOMYCIN, PEAK: Vancomycin Pk: 27 ug/mL — ABNORMAL LOW (ref 30–40)

## 2018-11-13 NOTE — Progress Notes (Addendum)
Pharmacy Antibiotic Note  Lance Green is a 43 y.o. male admitted on 11/10/2018 with orthopedic hand surgery.  Pharmacy has been consulted for vancomycin dosing.  Plan: DW 68 kg  Vd 44L Ki 0.098 hr-1  T1/2 7 hours  Vancomycin 1000 mg IV Q 12 hrs. Goal AUC 400-550. Expected AUC: 460 SCr used: 0.8  2/8 @ 08:47  Peak level = 27.   Trough level due at 17:30.  Height: 5\' 9"  (175.3 cm) Weight: 150 lb (68 kg) IBW/kg (Calculated) : 70.7  Temp (24hrs), Avg:98.5 F (36.9 C), Min:98.2 F (36.8 C), Max:98.7 F (37.1 C)  Recent Labs  Lab 11/10/18 1636 11/10/18 1953 11/11/18 1029 11/12/18 0344 11/13/18 0430 11/13/18 0847  WBC 12.3*  --  12.4* 14.2*  --  7.7  CREATININE 0.57*  --   --   --  1.01  --   LATICACIDVEN  --  1.3  --   --   --   --   VANCOPEAK  --   --   --   --   --  27*    Estimated Creatinine Clearance: 89.8 mL/min (by C-G formula based on SCr of 1.01 mg/dL).    No Known Allergies  Antimicrobials this admission: Vancomycin 2/5 >>  Unasyn 2/5 >>      Dose adjustments this admission: Checking Scr 2/8 and will adjust if needed - if continuing, will draw peak/trough after dose 5.   Microbiology results: 2/6 Surgical/Deep Wound - pending  Thank you for allowing pharmacy to be a part of this patient's care.  Stormy Card, [facility] Clinical Pharmacist 11/13/2018 2:10 PM

## 2018-11-13 NOTE — Progress Notes (Signed)
Subjective: 2 Days Post-Op Procedure(s) (LRB): INCISION AND DRAINAGE (Left) Patient reports pain as mild.   Patient is well, and has had no acute complaints or problems Plan is to go Home after hospital stay.  He is continuing IV antibiotics, currently on Zosyn and Vacnomycin Infectious disease consulted. Negative for chest pain and shortness of breath Fever: no Gastrointestinal: Negative for nausea and vomiting  Objective: Vital signs in last 24 hours: Temp:  [98.2 F (36.8 C)-98.7 F (37.1 C)] 98.2 F (36.8 C) (02/08 0751) Pulse Rate:  [61-81] 61 (02/08 0751) Resp:  [18] 18 (02/08 0751) BP: (151-176)/(91-107) 151/91 (02/08 0751) SpO2:  [98 %-100 %] 98 % (02/08 0751)  Intake/Output from previous day:  Intake/Output Summary (Last 24 hours) at 11/13/2018 0958 Last data filed at 11/12/2018 1903 Gross per 24 hour  Intake 480 ml  Output -  Net 480 ml    Intake/Output this shift: No intake/output data recorded.  Labs: Recent Labs    11/10/18 1636 11/11/18 1029 11/12/18 0344 11/13/18 0847  HGB 13.9 13.5 12.9* 13.0   Recent Labs    11/12/18 0344 11/13/18 0847  WBC 14.2* 7.7  RBC 4.14* 4.19*  HCT 37.3* 37.2*  PLT 206 205   Recent Labs    11/10/18 1636 11/13/18 0430  NA 140  --   K 3.7  --   CL 107  --   CO2 24  --   BUN 5*  --   CREATININE 0.57* 1.01  GLUCOSE 94  --   CALCIUM 9.3  --    No results for input(s): LABPT, INR in the last 72 hours.   EXAM General - Patient is Alert and Oriented Extremity - Sensation limited based on neuropathy.  Good skin warmth. Dressing/Incision - clean, dry, blood tinged drainage.  The surgical wrap was removed with very minimal drainage.  New Xeroform and dressings applied to the hand. Motor Function - intact, moving fingers gently on exam.      Past Medical History:  Diagnosis Date  . Neuropathy   . Pancreatitis   . Peptic ulcer disease    Assessment/Plan: 2 Days Post-Op Procedure(s) (LRB): INCISION AND  DRAINAGE (Left) Active Problems:   Abscess of left middle finger  Estimated body mass index is 22.15 kg/m as calculated from the following:   Height as of this encounter: 5\' 9"  (1.753 m).   Weight as of this encounter: 68 kg. Advance diet.  Labs reviewed this AM.  WBC 7.7 this AM.  CBC and BMP ordered for tomorrow morning. Culture currently growing few Viridans streptococcus, reincubated for better growth.  Gram stain with Gram positive cocci and gram negative rods. Continue IV antibiotics at this time.  Dressing change tomorrow morning.  Discharge home when medically stable.  DVT Prophylaxis - Lovenox  J. Horris Latino, PA-C Orthopaedic Surgery 11/13/2018, 9:58 AM

## 2018-11-13 NOTE — Progress Notes (Signed)
Pharmacy Antibiotic Note  Lance Green is a 43 y.o. male admitted on 11/10/2018 with orthopedic hand surgery.  Pharmacy has been consulted for vancomycin dosing.  Plan: Will continue vancomycin 1000 mg IV Q 12 hrs. Goal AUC 400-550. Calculated AUC per peak and trough is 472. Plan to re-assess in a few days or if renal function changes.    Continue this dosing - de-escalation pending wound cultures per Orthopedic note  Height: 5\' 9"  (175.3 cm) Weight: 150 lb (68 kg) IBW/kg (Calculated) : 70.7  Temp (24hrs), Avg:98.5 F (36.9 C), Min:98.2 F (36.8 C), Max:98.7 F (37.1 C)  Recent Labs  Lab 11/10/18 1636 11/10/18 1953 11/11/18 1029 11/12/18 0344 11/13/18 0430 11/13/18 0847 11/13/18 1744  WBC 12.3*  --  12.4* 14.2*  --  7.7  --   CREATININE 0.57*  --   --   --  1.01  --   --   LATICACIDVEN  --  1.3  --   --   --   --   --   VANCOTROUGH  --   --   --   --   --   --  12*  VANCOPEAK  --   --   --   --   --  27*  --     Estimated Creatinine Clearance: 89.8 mL/min (by C-G formula based on SCr of 1.01 mg/dL).    No Known Allergies  Antimicrobials this admission: Vancomycin 2/5 >>  Unasyn 2/5 >>      Dose adjustments this admission: Checking Scr 2/8 and will adjust if needed - if continuing, will draw peak/trough on day 5 and plan accordingly  Microbiology results: 2/6 Surgical/Deep Wound - pending  Thank you for allowing pharmacy to be a part of this patient's care.  Ronnald Ramp, PharmD, BCPS Clinical Pharmacist 11/13/2018 7:20 PM

## 2018-11-14 LAB — BASIC METABOLIC PANEL
Anion gap: 8 (ref 5–15)
BUN: 10 mg/dL (ref 6–20)
CO2: 28 mmol/L (ref 22–32)
Calcium: 8.8 mg/dL — ABNORMAL LOW (ref 8.9–10.3)
Chloride: 103 mmol/L (ref 98–111)
Creatinine, Ser: 0.96 mg/dL (ref 0.61–1.24)
GFR calc Af Amer: >60 mL/min (ref >60)
Glucose, Bld: 131 mg/dL — ABNORMAL HIGH (ref 70–99)
Potassium: 3.2 mmol/L — ABNORMAL LOW (ref 3.5–5.1)
Sodium: 139 mmol/L (ref 135–145)

## 2018-11-14 LAB — CBC
HCT: 36.5 % — ABNORMAL LOW (ref 39.0–52.0)
Hemoglobin: 12.5 g/dL — ABNORMAL LOW (ref 13.0–17.0)
MCH: 30.7 pg (ref 26.0–34.0)
MCHC: 34.2 g/dL (ref 30.0–36.0)
MCV: 89.7 fL (ref 80.0–100.0)
NRBC: 0 % (ref 0.0–0.2)
PLATELETS: 207 10*3/uL (ref 150–400)
RBC: 4.07 MIL/uL — AB (ref 4.22–5.81)
RDW: 12.4 % (ref 11.5–15.5)
WBC: 7.1 10*3/uL (ref 4.0–10.5)

## 2018-11-14 MED ORDER — OXYCODONE HCL 5 MG PO TABS: 5.0000 mg | ORAL_TABLET | ORAL | 0 refills | Status: DC | PRN

## 2018-11-14 MED ORDER — CEFDINIR 300 MG PO CAPS
300.0000 mg | ORAL_CAPSULE | Freq: Two times a day (BID) | ORAL | Status: DC
  Administered 2018-11-14: 300 mg via ORAL
  Filled 2018-11-14 (×2): qty 1

## 2018-11-14 MED ORDER — CEFDINIR 300 MG PO CAPS: 300.0000 mg | ORAL_CAPSULE | Freq: Two times a day (BID) | ORAL | 0 refills | Status: AC

## 2018-11-14 NOTE — Discharge Instructions (Signed)
Diet: As you were doing prior to hospitalization   Shower:  May shower but keep the wounds dry, use an occlusive plastic wrap, NO SOAKING IN TUB.  If the bandage gets wet, change with a clean dry gauze.  Dressing:  Remain in dressing until follow-up appointment with Orthopaedics this week.  Medications: Take antibiotic as directed, do not miss a dose.  Activity:  Increase activity slowly as tolerated, but follow the weight bearing instructions below.  No lifting or driving for 6 weeks.  Weight Bearing:   Minimal weightbearing to the left hand.  To prevent constipation: you may use a stool softener such as -  Colace (over the counter) 100 mg by mouth twice a day  Drink plenty of fluids (prune juice may be helpful) and high fiber foods Miralax (over the counter) for constipation as needed.    Itching:  If you experience itching with your medications, try taking only a single pain pill, or even half a pain pill at a time.  You may take up to 10 pain pills per day, and you can also use benadryl over the counter for itching or also to help with sleep.   Precautions:  If you experience chest pain or shortness of breath - call 911 immediately for transfer to the hospital emergency department!!  If you develop a fever greater that 101 F, purulent drainage from wound, increased redness or drainage from wound, or calf pain-Call [facility]                                              Follow- Up Appointment:  Please call for an appointment to be seen in 2 weeks at [facility]

## 2018-11-14 NOTE — Discharge Summary (Signed)
Physician Discharge Summary  Patient ID: Lance Green MRN: [id number] DOB/AGE: 05/07/1976 43 y.o.  Admit date: 11/10/2018 Discharge date: 11/14/2018  Admission Diagnoses:  Tenosynovitis [M65.9] Finger infection [L08.9]  Discharge Diagnoses: Patient Active Problem List   Diagnosis Date Noted  . Abscess of left middle finger 11/10/2018  . Acute appendicitis with localized peritonitis 07/06/2018    Past Medical History:  Diagnosis Date  . Neuropathy   . Pancreatitis   . Peptic ulcer disease    Transfusion: None.   Consultants (if any): Treatment Team:  Lynn Itoavishankar, Jayashree, MD  Discharged Condition: Improved  Hospital Course: Lance ProwsMichael L Leone is an 43 y.o. male who was admitted 11/10/2018 with a diagnosis of abscess of the left long finger with purulent tenosynovitis and went to the operating room on 11/11/2018 and underwent the above named procedures.    Surgeries: Procedure(s): INCISION AND DRAINAGE on 11/11/2018 Patient tolerated the surgery well. Taken to PACU where she was stabilized and then transferred to the orthopedic floor.  Started on Lovenox 40mg  q 24 hrs.  Heels elevated on bed with rolled towels. No evidence of DVT. Negative Homan.  Cultures final on 11/14/18.  Culture grew few viridans streptococcus and rare citrobacter freundii.  Infectious disease has changed patient over to oral Omnicef.  White count continued to decreased on IV Vancomycin and Zosyn.  Patient's IV was d/c on POD3.  Implants: None.  He was given perioperative antibiotics:  Anti-infectives (From admission, onward)   Start     Dose/Rate Route Frequency Ordered Stop   11/14/18 1130  cefdinir (OMNICEF) capsule 300 mg     300 mg Oral Every 12 hours 11/14/18 1120     11/14/18 0000  cefdinir (OMNICEF) 300 MG capsule     300 mg Oral Every 12 hours 11/14/18 1344 11/28/18 2359   11/12/18 2000  piperacillin-tazobactam (ZOSYN) IVPB 3.375 g  Status:  Discontinued     3.375 g 12.5 mL/hr over 240  Minutes Intravenous Every 8 hours 11/12/18 1638 11/14/18 1120   11/12/18 1645  piperacillin-tazobactam (ZOSYN) IVPB 3.375 g  Status:  Discontinued     3.375 g 100 mL/hr over 30 Minutes Intravenous Every 8 hours 11/12/18 1636 11/12/18 1636   11/11/18 0717  gentamicin (GARAMYCIN) 80 mg in sodium chloride 0.9 % 500 mL irrigation  Status:  Discontinued       As needed 11/11/18 0718 11/11/18 0740   11/11/18 0600  vancomycin (VANCOCIN) IVPB 1000 mg/200 mL premix  Status:  Discontinued     1,000 mg 200 mL/hr over 60 Minutes Intravenous Every 12 hours 11/10/18 2233 11/14/18 1120   11/10/18 2215  Ampicillin-Sulbactam (UNASYN) 3 g in sodium chloride 0.9 % 100 mL IVPB  Status:  Discontinued     3 g 200 mL/hr over 30 Minutes Intravenous Every 6 hours 11/10/18 2213 11/12/18 1636   11/10/18 1600  Ampicillin-Sulbactam (UNASYN) 3 g in sodium chloride 0.9 % 100 mL IVPB     3 g 200 mL/hr over 30 Minutes Intravenous  Once 11/10/18 1558 11/10/18 1711   11/10/18 1600  vancomycin (VANCOCIN) IVPB 1000 mg/200 mL premix     1,000 mg 200 mL/hr over 60 Minutes Intravenous  Once 11/10/18 1558 11/10/18 1814    .  He was given sequential compression devices, early ambulation, and Lovenox for DVT prophylaxis.  He benefited maximally from the hospital stay and there were no complications.    Recent vital signs:  Vitals:   11/13/18 2327 11/14/18 0941  BP: (!) 175/95 Marland Kitchen(!)  153/95  Pulse: 66 63  Resp: 18 18  Temp: 98.3 F (36.8 C) (!) 97.5 F (36.4 C)  SpO2: 97% 100%    Recent laboratory studies:  Lab Results  Component Value Date   HGB 12.5 (L) 11/14/2018   HGB 13.0 11/13/2018   HGB 12.9 (L) 11/12/2018   Lab Results  Component Value Date   WBC 7.1 11/14/2018   PLT 207 11/14/2018   Lab Results  Component Value Date   INR 0.9 01/18/2014   Lab Results  Component Value Date   NA 139 11/14/2018   K 3.2 (L) 11/14/2018   CL 103 11/14/2018   CO2 28 11/14/2018   BUN 10 11/14/2018   CREATININE 0.96  11/14/2018   GLUCOSE 131 (H) 11/14/2018    Discharge Medications:   Allergies as of 11/14/2018   No Known Allergies     Medication List    TAKE these medications   cefdinir 300 MG capsule Commonly known as:  OMNICEF Take 1 capsule (300 mg total) by mouth every 12 (twelve) hours for 14 days.   oxyCODONE 5 MG immediate release tablet Commonly known as:  Oxy IR/ROXICODONE Take 1-2 tablets (5-10 mg total) by mouth every 4 (four) hours as needed for moderate pain.       Diagnostic Studies: Dg Finger Middle Left  Result Date: 11/10/2018 CLINICAL DATA:  Pain and swelling of the left middle finger. The patient reports that he is been squeezing pus out of it. EXAM: LEFT MIDDLE FINGER 2+V COMPARISON:  None. FINDINGS: The bones are normal. There is diffuse soft tissue swelling of the left middle finger. No visible foreign body in the soft tissues. IMPRESSION: Soft tissue swelling.  Otherwise negative. Electronically Signed   By: Francene BoyersJames  Maxwell M.D.   On: 11/10/2018 16:04   Disposition: Plan for discharge home this afternoon on oral antibiotic Omnicef.  Dressing change performed today, will remain in dressing until follow-up appointment this week.  Social work to help patient obtain antibiotic.  Follow-up Information    Hooten, Illene LabradorJames P, MD. Call.   Specialty:  Orthopedic Surgery Why:  Call tomorrow to [facility].  Follow-up this week for a skin check. Contact information: [address] [facility] [place] [id number] [phone]          Signed: Meriel PicaJames L Calirose Mccance PA-C 11/14/2018, 1:45 PM

## 2018-11-14 NOTE — Progress Notes (Addendum)
Subjective: 3 Days Post-Op Procedure(s) (LRB): INCISION AND DRAINAGE (Left) Patient reports pain as mild.   Patient is well, and has had no acute complaints or problems Plan is to go Home after hospital stay.  He is continuing IV antibiotics, currently on Zosyn and Vacnomycin Infectious disease consulted. Negative for chest pain and shortness of breath Fever: no Gastrointestinal: Negative for nausea and vomiting  Objective: Vital signs in last 24 hours: Temp:  [98.3 F (36.8 C)-98.7 F (37.1 C)] 98.3 F (36.8 C) (02/08 2327) Pulse Rate:  [66] 66 (02/08 2327) Resp:  [18] 18 (02/08 2327) BP: (158-175)/(95-97) 175/95 (02/08 2327) SpO2:  [97 %-98 %] 97 % (02/08 2327)  Intake/Output from previous day:  Intake/Output Summary (Last 24 hours) at 11/14/2018 0928 Last data filed at 11/14/2018 1610 Gross per 24 hour  Intake 2204.04 ml  Output -  Net 2204.04 ml    Intake/Output this shift: Total I/O In: 200 [IV Piggyback:200] Out: -   Labs: Recent Labs    11/11/18 1029 11/12/18 0344 11/13/18 0847 11/14/18 0319  HGB 13.5 12.9* 13.0 12.5*   Recent Labs    11/13/18 0847 11/14/18 0319  WBC 7.7 7.1  RBC 4.19* 4.07*  HCT 37.2* 36.5*  PLT 205 207   Recent Labs    11/13/18 0430 11/14/18 0319  NA  --  139  K  --  3.2*  CL  --  103  CO2  --  28  BUN  --  10  CREATININE 1.01 0.96  GLUCOSE  --  131*  CALCIUM  --  8.8*   No results for input(s): LABPT, INR in the last 72 hours.   EXAM General - Patient is Alert and Oriented Extremity - Sensation limited based on neuropathy.  Good skin warmth. Dressing/Incision - clean, dry, blood tinged drainage.  The surgical wrap was removed with very minimal drainage.  New Xeroform and dressings applied to the hand.  No purulent drainage noted today.  Swelling has improved. Motor Function - intact, moving fingers gently on exam. Reports minimal pain with flexion.  Past Medical History:  Diagnosis Date  . Neuropathy   .  Pancreatitis   . Peptic ulcer disease    Assessment/Plan: 3 Days Post-Op Procedure(s) (LRB): INCISION AND DRAINAGE (Left) Active Problems:   Abscess of left middle finger  Estimated body mass index is 22.15 kg/m as calculated from the following:   Height as of this encounter: 5\' 9"  (1.753 m).   Weight as of this encounter: 68 kg. Advance diet.  Labs reviewed this AM.  WBC 7.1 this AM.  CBC ordered for tomorrow morning. Awaiting final culture prior to discharge due to the mixed growth shown on gram stain.  Pending cultures will plan for discharge home later this afternoon, most likely tomorrow. Continue IV antibiotics at this time.  Addendum:  Cultures have returned, infectious disease has changed to oral Omnicef.  Will discharge home today and follow-up with [facility] this week.  Discharge home when medically stable.  DVT Prophylaxis - Lovenox  J. Horris Latino, PA-C Orthopaedic Surgery 11/14/2018, 9:28 AM

## 2018-11-14 NOTE — Progress Notes (Signed)
Patient discharging home. Prescriptions were escribed. Instructions given to patient, verbalized understanding. Given cab voucher, waiting on cab to arrive.

## 2018-11-15 LAB — AEROBIC/ANAEROBIC CULTURE W GRAM STAIN (SURGICAL/DEEP WOUND)

## 2018-11-15 LAB — AEROBIC/ANAEROBIC CULTURE (SURGICAL/DEEP WOUND)

## 2019-04-05 ENCOUNTER — Other Ambulatory Visit: Payer: Self-pay

## 2019-04-05 ENCOUNTER — Encounter: Payer: Self-pay | Admitting: Emergency Medicine

## 2019-04-05 ENCOUNTER — Emergency Department
Admission: EM | Admit: 2019-04-05 | Discharge: 2019-04-05 | Disposition: A | Discharge status: Home / Self Care | Payer: Self-pay | Attending: Emergency Medicine | Admitting: Emergency Medicine

## 2019-04-05 DIAGNOSIS — F17210 Nicotine dependence, cigarettes, uncomplicated: Secondary | ICD-10-CM | POA: Insufficient documentation

## 2019-04-05 DIAGNOSIS — R569 Unspecified convulsions: Secondary | ICD-10-CM | POA: Insufficient documentation

## 2019-04-05 LAB — CBC WITH DIFFERENTIAL/PLATELET
Abs Immature Granulocytes: 0.04 10*3/uL (ref 0.00–0.07)
Basophils Absolute: 0 10*3/uL (ref 0.0–0.1)
Basophils Relative: 1 %
Eosinophils Absolute: 0 10*3/uL (ref 0.0–0.5)
Eosinophils Relative: 0 %
HCT: 33 % — ABNORMAL LOW (ref 39.0–52.0)
Hemoglobin: 11.6 g/dL — ABNORMAL LOW (ref 13.0–17.0)
Immature Granulocytes: 1 %
Lymphocytes Relative: 18 %
Lymphs Abs: 1.2 10*3/uL (ref 0.7–4.0)
MCH: 32.1 pg (ref 26.0–34.0)
MCHC: 35.2 g/dL (ref 30.0–36.0)
MCV: 91.4 fL (ref 80.0–100.0)
Monocytes Absolute: 0.6 10*3/uL (ref 0.1–1.0)
Monocytes Relative: 9 %
Neutro Abs: 4.6 10*3/uL (ref 1.7–7.7)
Neutrophils Relative %: 71 %
Platelets: 167 10*3/uL (ref 150–400)
RBC: 3.61 MIL/uL — ABNORMAL LOW (ref 4.22–5.81)
RDW: 12.4 % (ref 11.5–15.5)
WBC: 6.4 10*3/uL (ref 4.0–10.5)
nRBC: 0 % (ref 0.0–0.2)

## 2019-04-05 LAB — BASIC METABOLIC PANEL
Anion gap: 12 (ref 5–15)
BUN: 8 mg/dL (ref 6–20)
CO2: 23 mmol/L (ref 22–32)
Calcium: 9 mg/dL (ref 8.9–10.3)
Chloride: 103 mmol/L (ref 98–111)
Creatinine, Ser: 0.9 mg/dL (ref 0.61–1.24)
GFR calc Af Amer: >60 mL/min (ref >60)
GFR calc non Af Amer: >60 mL/min (ref >60)
Glucose, Bld: 100 mg/dL — ABNORMAL HIGH (ref 70–99)
Potassium: 4.1 mmol/L (ref 3.5–5.1)
Sodium: 138 mmol/L (ref 135–145)

## 2019-04-05 NOTE — TOC Initial Note (Signed)
Transition of Care [facility]) - Initial/Assessment Note    Patient Details  Name: DONZEL ROMACK MRN: [id number] Date of Birth: [birth year]  [facility]) CM/SW Contact:    Marshell Garfinkel, RN Phone Number: 04/05/2019, 5:32 PM  Clinical Narrative:                  ED RNCM met with patient to provide indigent health care information and application to [facility].  Patient received Od[place] assistance back in Feb. 2020.  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         Patient Goals and CMS Choice        Expected Discharge Plan and Services     Discharge Planning Services: [facility]                                          Prior Living Arrangements/Services                       Activities of Daily Living      Permission Sought/Granted                  Emotional Assessment              Admission diagnosis:  Seizure Patient Active Problem List   Diagnosis Date Noted  . Abscess of left middle finger 11/10/2018  . Acute appendicitis with localized peritonitis 07/06/2018   PCP:  Patient, No Pcp Per Pharmacy:   [facility] [address]), [place]) [place] [address] Phone: [phone] Fax: [phone]     Social Determinants of Health (SDOH) Interventions    Readmission Risk Interventions No flowsheet data found.

## 2019-04-05 NOTE — ED Provider Notes (Signed)
[facility] Emergency Department Provider Note   ____________________________________________   I have reviewed the triage vital signs and the nursing notes.   HISTORY  Chief Complaint Seizures   History limited by: poor recollection of events   HPI Lance Green is a 43 y.o. male who presents to the emergency department today as of concern for seizure.  The patient apparently was at work.  Per report the seizure lasted 2 to 3 minutes.  Patient states that he does not know what happened today.  He does not know where he was when the episode occurred.  The patient says that he has had seizures in the past has about 3 or 4 in his life.  The last one occurring late last year.  He denies having seen a neurologist or been on any seizure medications.  He denies any recent illness.  Records reviewed. Per medical record review patient has a history of CT head on 11/2 without concerning abnormality  Past Medical History:  Diagnosis Date  . Neuropathy   . Pancreatitis   . Peptic ulcer disease     Patient Active Problem List   Diagnosis Date Noted  . Abscess of left middle finger 11/10/2018  . Acute appendicitis with localized peritonitis 07/06/2018    Past Surgical History:  Procedure Laterality Date  . APPENDECTOMY    . INCISION AND DRAINAGE Left 11/11/2018   Procedure: INCISION AND DRAINAGE;  Surgeon: Dereck Leep, MD;  Location: [facility] ORS;  Service: Orthopedics;  Laterality: Left;  . LAPAROSCOPIC APPENDECTOMY N/A 07/06/2018   Procedure: APPENDECTOMY LAPAROSCOPIC;  Surgeon: Herbert Pun, MD;  Location: [facility] ORS;  Service: General;  Laterality: N/A;    Prior to Admission medications   Medication Sig Start Date End Date Taking? Authorizing Provider  oxyCODONE (OXY IR/ROXICODONE) 5 MG immediate release tablet Take 1-2 tablets (5-10 mg total) by mouth every 4 (four) hours as needed for moderate pain. 11/14/18   Lattie Corns, PA-C     Allergies Patient has no known allergies.  No family history on file.  Social History Social History   Tobacco Use  . Smoking status: Current Every Day Smoker    Packs/day: 0.50    Types: Cigarettes  . Smokeless tobacco: Never Used  Substance Use Topics  . Alcohol use: Yes    Comment: daily 40 oz  . Drug use: No    Review of Systems Constitutional: No fever/chills Eyes: No visual changes. ENT: No sore throat. Cardiovascular: Denies chest pain. Respiratory: Denies shortness of breath. Gastrointestinal: No abdominal pain.  No nausea, no vomiting.  No diarrhea.   Genitourinary: Negative for dysuria. Musculoskeletal: Negative for back pain. Skin: Negative for rash. Neurological: Positive for neuropathy of hands and feet.                           ____________________________________________   PHYSICAL EXAM:  VITAL SIGNS: ED Triage Vitals  Enc Vitals Group     BP 160/106     Pulse 77     Resp      Temp 98.1     Temp src      SpO2 100   Constitutional: Awake and alert. Not completely oriented to events.  Eyes: Conjunctivae are normal.  ENT      Head: Normocephalic and atraumatic.      Nose: No congestion/rhinnorhea.      Mouth/Throat: Mucous membranes are moist.      Neck: No  stridor. Hematological/Lymphatic/Immunilogical: No cervical lymphadenopathy. Cardiovascular: Normal rate, regular rhythm.  No murmurs, rubs, or gallops.  Respiratory: Normal respiratory effort without tachypnea nor retractions. Breath sounds are clear and equal bilaterally. No wheezes/rales/rhonchi. Gastrointestinal: Soft and non tender. No rebound. No guarding.  Genitourinary: Deferred Musculoskeletal: Normal range of motion in all extremities. No lower extremity edema. Neurologic:  Normal speech and language. Recent memory loss. No focal neurologic deficits are appreciated.  Skin:  Skin is warm, dry and intact. No rash noted. Psychiatric: Mood and affect are normal. Speech and  behavior are normal. Patient exhibits appropriate insight and judgment.  ____________________________________________    LABS (pertinent positives/negatives)  BMP wnl except glu 100 CBC wbc 6.4, hgb 11.6, plt 167  ____________________________________________   EKG  I, Phineas SemenGraydon Josi Roediger, attending physician, personally viewed and interpreted this EKG  EKG Time: 1605 Rate: 75 Rhythm: normal sinus rhythm Axis: normal Intervals: qtc 479 QRS: narrow ST changes: j point elevation v2, v3 Impression: abnormal ekg  Morphology consistent with EKGs dating back to 2015   ____________________________________________    RADIOLOGY  None  ____________________________________________   PROCEDURES  Procedures  ____________________________________________   INITIAL IMPRESSION / ASSESSMENT AND PLAN / ED COURSE  Pertinent labs & imaging results that were available during my care of the patient were reviewed by me and considered in my medical decision making (see chart for details).   Patient presented to the emergency department today because of concern for seizure like episode. Patient did have some memory loss around the time of the event. The patient without concerning blood work findings. Did have negative CT head late last year when he states he was having similar symptoms. The patient without any further seizure activity here. Did discuss with patient importance of follow up with neurology. Discussed seizure precautions.   EKG did have some j point elevation. Patient without any chest pain. On review of previous EKGs similar morphology is seen dating back to 2015.   ____________________________________________   FINAL CLINICAL IMPRESSION(S) / ED DIAGNOSES  Final diagnoses:  Seizure-like activity (HCC)     Note: This dictation was prepared with Dragon dictation. Any transcriptional errors that result from this process are unintentional     Phineas SemenGoodman, Justinn Welter,  MD 04/05/19 Rickey [id number]

## 2019-04-05 NOTE — Discharge Instructions (Addendum)
As we discussed please do not drive, go up on roofs, in pools or put yourself or others at risk if you were to have another seizure. It is very important that you follow up with the neurologists. Please seek medical attention for any high fevers, chest pain, shortness of breath, change in behavior, persistent vomiting, bloody stool or any other new or concerning symptoms.

## 2019-04-05 NOTE — ED Triage Notes (Signed)
Pt ems from work for seizure. Per ems pt had a witnessed seizure lasting 2-3 min. Pt with hx seizures, not on medication, last seizure maybe last year.

## 2019-06-09 ENCOUNTER — Other Ambulatory Visit: Payer: Self-pay

## 2019-06-09 ENCOUNTER — Emergency Department: Payer: Self-pay

## 2019-06-09 ENCOUNTER — Encounter: Payer: Self-pay | Admitting: Emergency Medicine

## 2019-06-09 ENCOUNTER — Emergency Department
Admission: EM | Admit: 2019-06-09 | Discharge: 2019-06-09 | Disposition: A | Discharge status: Home / Self Care | Payer: Self-pay | Attending: Emergency Medicine | Admitting: Emergency Medicine

## 2019-06-09 DIAGNOSIS — G44209 Tension-type headache, unspecified, not intractable: Secondary | ICD-10-CM

## 2019-06-09 DIAGNOSIS — Z20822 Contact with and (suspected) exposure to covid-19: Secondary | ICD-10-CM

## 2019-06-09 DIAGNOSIS — Z20828 Contact with and (suspected) exposure to other viral communicable diseases: Secondary | ICD-10-CM | POA: Insufficient documentation

## 2019-06-09 DIAGNOSIS — R51 Headache: Secondary | ICD-10-CM | POA: Insufficient documentation

## 2019-06-09 DIAGNOSIS — F17210 Nicotine dependence, cigarettes, uncomplicated: Secondary | ICD-10-CM | POA: Insufficient documentation

## 2019-06-09 DIAGNOSIS — R05 Cough: Secondary | ICD-10-CM | POA: Insufficient documentation

## 2019-06-09 DIAGNOSIS — R059 Cough, unspecified: Secondary | ICD-10-CM

## 2019-06-09 IMAGING — DX DG CHEST 1V PORT
1 series · 1 of 1 positions shown · non-contrast
Comparison: [DATE]

CLINICAL DATA: Head pain.

EXAM:
PORTABLE CHEST 1 VIEW

[chest ap]
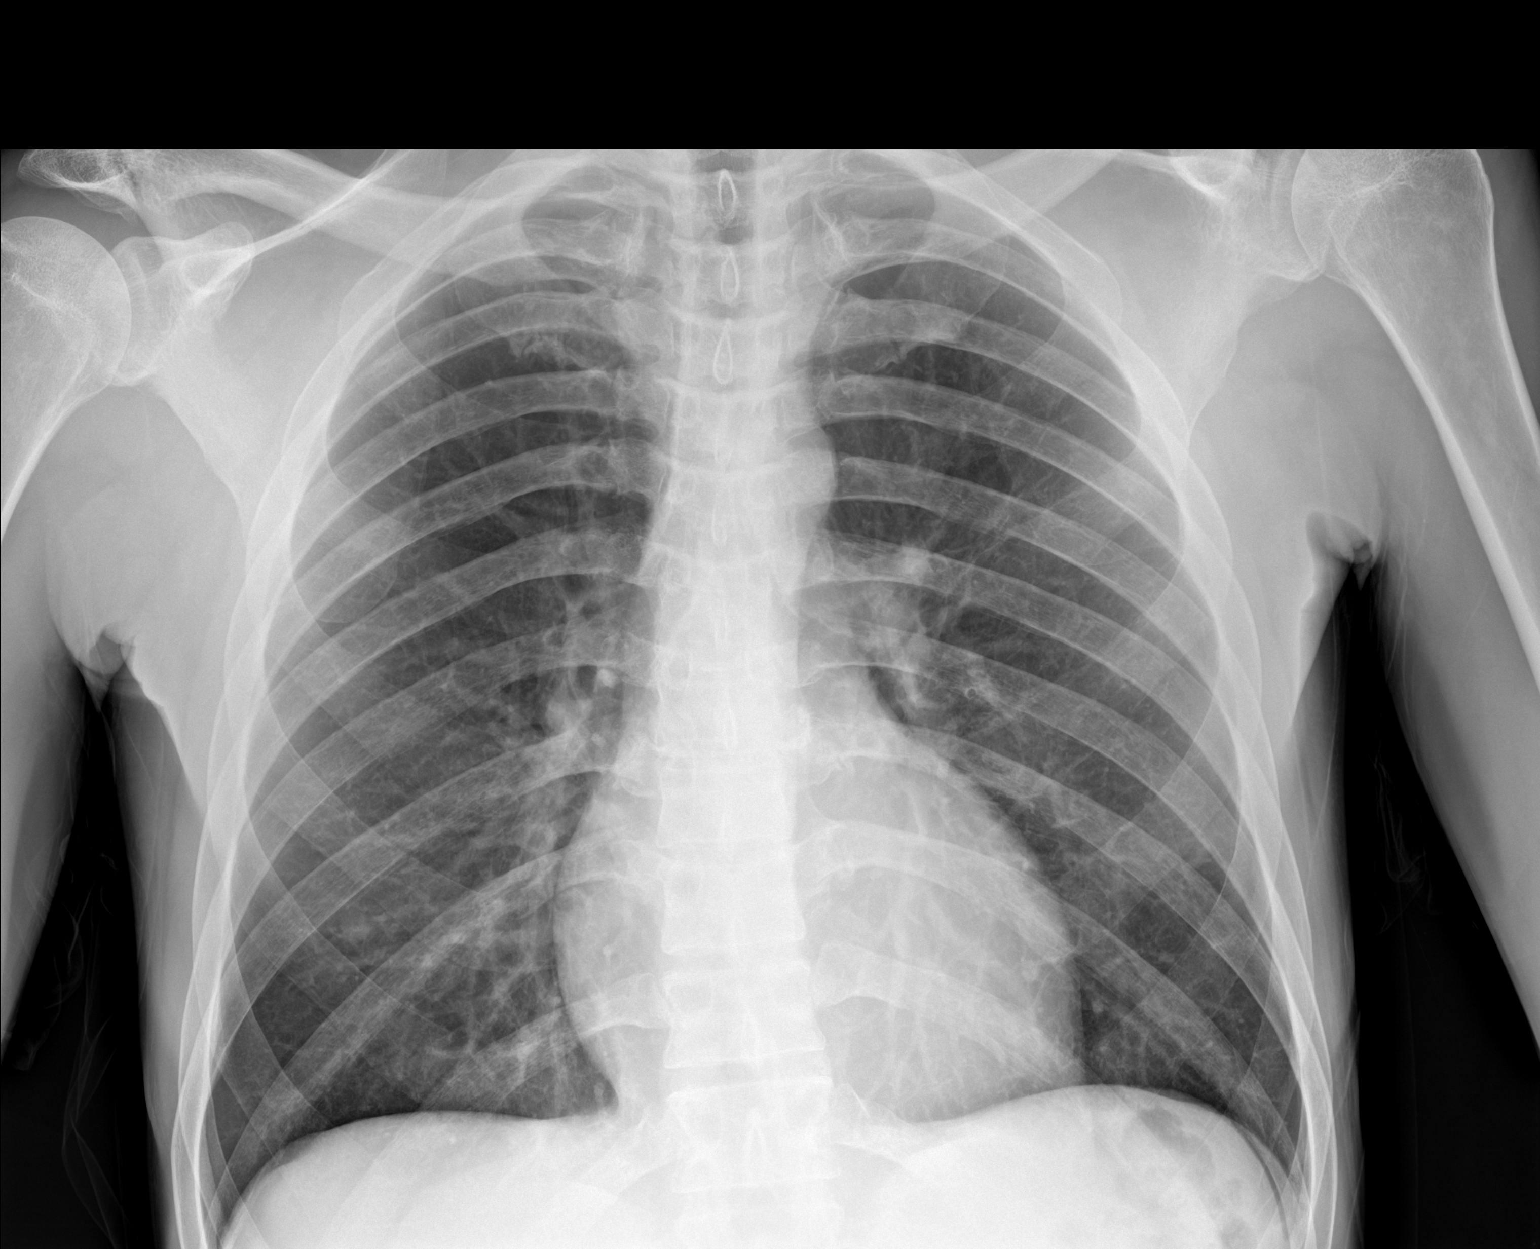

[1 of 1 positions shown; findings below may reference images not displayed]

FINDINGS: Cardiomediastinal silhouette is normal. Mediastinal contours appear
intact.

There is no evidence of focal airspace consolidation, pleural
effusion or pneumothorax.

Osseous structures are without acute abnormality. Soft tissues are
grossly normal.
IMPRESSION: No active disease.

## 2019-06-09 MED ORDER — KETOROLAC TROMETHAMINE 60 MG/2ML IM SOLN
30.0000 mg | Freq: Once | INTRAMUSCULAR | Status: AC
  Administered 2019-06-09: 30 mg via INTRAMUSCULAR
  Filled 2019-06-09: qty 2

## 2019-06-09 NOTE — ED Notes (Signed)
NAD noted at time of D/C. Pt denies questions or concerns. Pt ambulatory to the lobby at this time to catch the bus.

## 2019-06-09 NOTE — ED Triage Notes (Signed)
Pt presents to ED via [facility] with c/o "head pain" x 3 days. Pt states "I don't have a headache, I have head pain". Pt c/o generalized pain to top of his head. Pt states exposure to his neighbor who tested positive for Covid, per EMS exposure was outside and several days ago. Pt states unknown if had any fevers, hx of neuropathy to bilateral hands, denies any new numbness. Pt is A&O x4, ambulatory from the EMS bay. Pt states he lives at a boarding house. Pt states he feels like he can feel "lumps all over his head". Per EMS VSS and WNL. Pt denies weakness, new numbness, N/V.

## 2019-06-09 NOTE — ED Provider Notes (Signed)
[facility] Emergency Department Provider Note   ____________________________________________   First MD Initiated Contact with Patient 06/09/19 [phone]     (approximate)  I have reviewed the triage vital signs and the nursing notes.   HISTORY  Chief Complaint Migraine    HPI Lance Green is a 43 y.o. male with past medical history of neuropathy who presents to the ED complaining of headache.  Patient reports he has had gradually worsening pain affecting his head diffusely for the past 2 to 3 days.  He describes it as throbbing and severe, denies any history of headaches or migraines.  He has not had any fevers and denies any stiffness in his neck.  He states he has numbness in his extremities chronically, but has not had any new numbness, weakness, or vision changes.  He took a "pain pill" for his headache yesterday without any relief.  He additionally reports a cough and myalgias, but denies any chest pain or shortness of breath.  He does state he was exposed to a neighbor who had COVID.        Past Medical History:  Diagnosis Date  . Neuropathy   . Pancreatitis   . Peptic ulcer disease     Patient Active Problem List   Diagnosis Date Noted  . Abscess of left middle finger 11/10/2018  . Acute appendicitis with localized peritonitis 07/06/2018    Past Surgical History:  Procedure Laterality Date  . APPENDECTOMY    . INCISION AND DRAINAGE Left 11/11/2018   Procedure: INCISION AND DRAINAGE;  Surgeon: Dereck Leep, MD;  Location: [facility] ORS;  Service: Orthopedics;  Laterality: Left;  . LAPAROSCOPIC APPENDECTOMY N/A 07/06/2018   Procedure: APPENDECTOMY LAPAROSCOPIC;  Surgeon: Herbert Pun, MD;  Location: [facility] ORS;  Service: General;  Laterality: N/A;    Prior to Admission medications   Medication Sig Start Date End Date Taking? Authorizing Provider  oxyCODONE (OXY IR/ROXICODONE) 5 MG immediate release tablet Take 1-2 tablets (5-10 mg  total) by mouth every 4 (four) hours as needed for moderate pain. Patient not taking: Reported on 06/09/2019 11/14/18   Lattie Corns, PA-C    Allergies Patient has no known allergies.  No family history on file.  Social History Social History   Tobacco Use  . Smoking status: Current Every Day Smoker    Packs/day: 0.50    Types: Cigarettes  . Smokeless tobacco: Never Used  Substance Use Topics  . Alcohol use: Yes    Comment: daily 40 oz  . Drug use: No    Review of Systems  Constitutional: No fever/chills Eyes: No visual changes. ENT: No sore throat. Cardiovascular: Denies chest pain. Respiratory: Denies shortness of breath.  Positive for cough.  Gastrointestinal: No abdominal pain.  No nausea, no vomiting.  No diarrhea.  No constipation. Genitourinary: Negative for dysuria. Musculoskeletal: Negative for back pain. Skin: Negative for rash. Neurological: Positive for headache, negative for focal weakness or numbness.  ____________________________________________   PHYSICAL EXAM:  VITAL SIGNS: ED Triage Vitals  Enc Vitals Group     BP 06/09/19 0741 135/86     Pulse Rate 06/09/19 0741 87     Resp 06/09/19 0741 18     Temp 06/09/19 0741 98.1 F (36.7 C)     Temp Source 06/09/19 0741 Oral     SpO2 06/09/19 0741 93 %     Weight 06/09/19 0744 150 lb (68 kg)     Height 06/09/19 0744 5\' 8"  (1.727 m)  Head Circumference --      Peak Flow --      Pain Score 06/09/19 0744 10     Pain Loc --      Pain Edu? --      Excl. in GC? --     Constitutional: Alert and oriented. Eyes: Conjunctivae are normal. Head: Atraumatic. Nose: No congestion/rhinnorhea. Mouth/Throat: Mucous membranes are moist. Neck: Normal ROM Cardiovascular: Normal rate, regular rhythm. Grossly normal heart sounds. Respiratory: Normal respiratory effort.  No retractions. Lungs CTAB. Gastrointestinal: Soft and nontender. No distention. Genitourinary: deferred Musculoskeletal: No lower  extremity tenderness nor edema. Neurologic:  Normal speech and language. No gross focal neurologic deficits are appreciated.  5 out of 5 strength in bilateral upper and lower extremities, no pronator drift.  Cranial nerves II through XII grossly intact. Skin:  Skin is warm, dry and intact. No rash noted. Psychiatric: Mood and affect are normal. Speech and behavior are normal.  ____________________________________________   LABS (all labs ordered are listed, but only abnormal results are displayed)  Labs Reviewed  NOVEL CORONAVIRUS, NAA (HOSP ORDER, SEND-OUT TO REF LAB; TAT 18-24 HRS)     PROCEDURES  Procedure(s) performed (including Critical Care):  Procedures   ____________________________________________   INITIAL IMPRESSION / ASSESSMENT AND PLAN / ED COURSE       43 year old male presenting to the ED with gradually worsening headache over the past 2 to 3 days associated with cough and diffuse muscle aches.  He has a benign and nonfocal neurologic exam, given gradual onset headache, do not suspect SAH.  No fevers or neck stiffness to suggest meningitis.  Presentation most consistent with tension headache versus viral syndrome.  Will screen chest x-ray given his cough and perform COVID-19 testing, treat symptomatically with Toradol.  Chest x-ray negative for acute process.  COVID-19 testing pending, patient informed of quarantine precautions until results come back.  He reports feeling much better after dose of Toradol, suspect tension headache.  Counseled patient to follow-up with PCP and return to the ED for new or worsening symptoms.  Patient agrees with plan.      ____________________________________________   FINAL CLINICAL IMPRESSION(S) / ED DIAGNOSES  Final diagnoses:  Tension headache  Cough  Exposure to Covid-19 Virus     ED Discharge Orders    None       Note:  This document was prepared using Dragon voice recognition software and may include  unintentional dictation errors.   Chesley NoonJessup, Collis Thede, MD 06/09/19 [phone]

## 2019-06-10 LAB — NOVEL CORONAVIRUS, NAA (HOSP ORDER, SEND-OUT TO REF LAB; TAT 18-24 HRS): SARS-CoV-2, NAA: NOT DETECTED

## 2019-06-14 ENCOUNTER — Encounter: Payer: Self-pay | Admitting: Emergency Medicine

## 2019-06-14 ENCOUNTER — Emergency Department
Admission: EM | Admit: 2019-06-14 | Discharge: 2019-06-14 | Disposition: A | Discharge status: Home / Self Care | Payer: Self-pay | Attending: Student | Admitting: Student

## 2019-06-14 ENCOUNTER — Other Ambulatory Visit: Payer: Self-pay

## 2019-06-14 DIAGNOSIS — F17210 Nicotine dependence, cigarettes, uncomplicated: Secondary | ICD-10-CM | POA: Insufficient documentation

## 2019-06-14 DIAGNOSIS — L03211 Cellulitis of face: Secondary | ICD-10-CM | POA: Insufficient documentation

## 2019-06-14 LAB — COMPREHENSIVE METABOLIC PANEL
ALT: 19 U/L (ref 0–44)
AST: 50 U/L — ABNORMAL HIGH (ref 15–41)
Albumin: 3.3 g/dL — ABNORMAL LOW (ref 3.5–5.0)
Alkaline Phosphatase: 108 U/L (ref 38–126)
Anion gap: 10 (ref 5–15)
BUN: <5 mg/dL — ABNORMAL LOW (ref 6–20)
CO2: 26 mmol/L (ref 22–32)
Calcium: 8.8 mg/dL — ABNORMAL LOW (ref 8.9–10.3)
Chloride: 102 mmol/L (ref 98–111)
Creatinine, Ser: 0.58 mg/dL — ABNORMAL LOW (ref 0.61–1.24)
GFR calc Af Amer: >60 mL/min (ref >60)
GFR calc non Af Amer: >60 mL/min (ref >60)
Glucose, Bld: 134 mg/dL — ABNORMAL HIGH (ref 70–99)
Potassium: 3.6 mmol/L (ref 3.5–5.1)
Sodium: 138 mmol/L (ref 135–145)
Total Bilirubin: 0.7 mg/dL (ref 0.3–1.2)
Total Protein: 7.5 g/dL (ref 6.5–8.1)

## 2019-06-14 LAB — CBC WITH DIFFERENTIAL/PLATELET
Abs Immature Granulocytes: 0.04 10*3/uL (ref 0.00–0.07)
Basophils Absolute: 0 10*3/uL (ref 0.0–0.1)
Basophils Relative: 0 %
Eosinophils Absolute: 0 10*3/uL (ref 0.0–0.5)
Eosinophils Relative: 0 %
HCT: 33.8 % — ABNORMAL LOW (ref 39.0–52.0)
Hemoglobin: 12.1 g/dL — ABNORMAL LOW (ref 13.0–17.0)
Immature Granulocytes: 0 %
Lymphocytes Relative: 19 %
Lymphs Abs: 1.9 10*3/uL (ref 0.7–4.0)
MCH: 31.9 pg (ref 26.0–34.0)
MCHC: 35.8 g/dL (ref 30.0–36.0)
MCV: 89.2 fL (ref 80.0–100.0)
Monocytes Absolute: 0.9 10*3/uL (ref 0.1–1.0)
Monocytes Relative: 9 %
Neutro Abs: 7 10*3/uL (ref 1.7–7.7)
Neutrophils Relative %: 72 %
Platelets: 217 10*3/uL (ref 150–400)
RBC: 3.79 MIL/uL — ABNORMAL LOW (ref 4.22–5.81)
RDW: 11.9 % (ref 11.5–15.5)
WBC: 9.9 10*3/uL (ref 4.0–10.5)
nRBC: 0 % (ref 0.0–0.2)

## 2019-06-14 MED ORDER — DEXAMETHASONE SODIUM PHOSPHATE 10 MG/ML IJ SOLN
10.0000 mg | Freq: Once | INTRAMUSCULAR | Status: AC
  Administered 2019-06-14: 08:00:00 10 mg via INTRAVENOUS
  Filled 2019-06-14: qty 1

## 2019-06-14 MED ORDER — SULFAMETHOXAZOLE-TRIMETHOPRIM 800-160 MG PO TABS: 1.0000 | ORAL_TABLET | Freq: Two times a day (BID) | ORAL | 0 refills | Status: DC

## 2019-06-14 MED ORDER — CLINDAMYCIN PHOSPHATE 600 MG/50ML IV SOLN
600.0000 mg | Freq: Once | INTRAVENOUS | Status: AC
  Administered 2019-06-14: 08:00:00 600 mg via INTRAVENOUS
  Filled 2019-06-14: qty 50

## 2019-06-14 MED ORDER — PREDNISONE 10 MG PO TABS: ORAL_TABLET | ORAL | 0 refills | Status: DC

## 2019-06-14 NOTE — ED Notes (Signed)
Pt reports that last night he noticed swelling and redness under right eye - he woke up this am and noticed the redness/swelling had increased and that the pain radiated into right cheek and ear

## 2019-06-14 NOTE — Discharge Instructions (Signed)
Return to the emergency department in 2 to 3 days if no improvement for reevaluation.  Begin taking antibiotics as directed with Bactrim being twice a day for the next 10 days.  Prednisone is a tapering dose with decreasing doses daily.  You may also take Benadryl 1 to 2 tablets every 6 hours if needed for itching.  Use warm moist compresses to the area.  Return to the emergency department sooner if any worsening of your symptoms such as fever, chills or increase in swelling.

## 2019-06-14 NOTE — ED Provider Notes (Signed)
[facility] Emergency Department Provider Note  ____________________________________________   First MD Initiated Contact with Patient 06/14/19 [phone]     (approximate)  I have reviewed the triage vital signs and the nursing notes.   HISTORY  Chief Complaint Cellulitis   HPI Lance Green is a 43 y.o. male presents to the ED with complaint of swelling and redness under his right eye.  He states that he woke up this morning noticing that swelling was worse.  He also complains of itching but does not take any over-the-counter medication for this.  He is unaware of any insect bite.  He denies any visual changes or discharge from his eyes.  He denies fever or chills.  There is also no dental abscesses or caries on the right side of his face.  He rates his pain as 4 out of 10.      Past Medical History:  Diagnosis Date  . Neuropathy   . Pancreatitis   . Peptic ulcer disease     Patient Active Problem List   Diagnosis Date Noted  . Abscess of left middle finger 11/10/2018  . Acute appendicitis with localized peritonitis 07/06/2018    Past Surgical History:  Procedure Laterality Date  . APPENDECTOMY    . INCISION AND DRAINAGE Left 11/11/2018   Procedure: INCISION AND DRAINAGE;  Surgeon: Donato HeinzHooten, James P, MD;  Location: [facility] ORS;  Service: Orthopedics;  Laterality: Left;  . LAPAROSCOPIC APPENDECTOMY N/A 07/06/2018   Procedure: APPENDECTOMY LAPAROSCOPIC;  Surgeon: Carolan Shiverintron-Diaz, Edgardo, MD;  Location: [facility] ORS;  Service: General;  Laterality: N/A;    Prior to Admission medications   Medication Sig Start Date End Date Taking? Authorizing Provider  predniSONE (DELTASONE) 10 MG tablet Take 6 tablets  today, on day 2 take 5 tablets, day 3 take 4 tablets, day 4 take 3 tablets, day 5 take  2 tablets and 1 tablet the last day 06/14/19   Tommi RumpsSummers, Aaiden Depoy L, PA-C  sulfamethoxazole-trimethoprim (BACTRIM DS) 800-160 MG tablet Take 1 tablet by mouth 2 (two) times daily.  06/14/19   Tommi RumpsSummers, Shalisa Mcquade L, PA-C    Allergies Patient has no known allergies.  No family history on file.  Social History Social History   Tobacco Use  . Smoking status: Current Every Day Smoker    Packs/day: 0.50    Types: Cigarettes  . Smokeless tobacco: Never Used  Substance Use Topics  . Alcohol use: Yes    Comment: daily 40 oz  . Drug use: No    Review of Systems Constitutional: No fever/chills Eyes: No visual changes. ENT: No sore throat.  Positive for right facial edema. Cardiovascular: Denies chest pain. Respiratory: Denies shortness of breath. Gastrointestinal:   No nausea, no vomiting.  Musculoskeletal: Negative for back pain. Skin: Positive for erythema and edema right facial area. Neurological: Negative for headaches, focal weakness or numbness. ____________________________________________   PHYSICAL EXAM:  VITAL SIGNS: ED Triage Vitals  Enc Vitals Group     BP 06/14/19 0539 (!) 146/85     Pulse Rate 06/14/19 0539 78     Resp 06/14/19 0539 18     Temp 06/14/19 0539 99.3 F (37.4 C)     Temp Source 06/14/19 0539 Oral     SpO2 06/14/19 0539 99 %     Weight --      Height --      Head Circumference --      Peak Flow --      Pain Score 06/14/19 0542  4     Pain Loc --      Pain Edu? --      Excl. in [place]? --    Constitutional: Alert and oriented. Well appearing and in no acute distress. Eyes: Conjunctivae are normal. PERRL. EOMI. Head: Atraumatic. Nose: No congestion/rhinnorhea. Mouth/Throat: Mucous membranes are moist.  Oropharynx non-erythematous.  No dental abscesses are appreciated.  No edema noted of the gum. Neck: No stridor.   Hematological/Lymphatic/Immunilogical: No cervical lymphadenopathy. Cardiovascular: Normal rate, regular rhythm. Grossly normal heart sounds.  Good peripheral circulation. Respiratory: Normal respiratory effort.  No retractions. Lungs CTAB. Musculoskeletal: Moves upper and lower extremities without any difficulty.   Normal gait was noted. Neurologic:  Normal speech and language. No gross focal neurologic deficits are appreciated. No gait instability. Skin:  Skin is warm, dry and intact.  Skin right facial area infraorbital area is erythematous and warm to touch.  Area is firm.  No obvious insect bite.  At this time there is no palpable abscess. Psychiatric: Mood and affect are normal. Speech and behavior are normal.  ____________________________________________   LABS (all labs ordered are listed, but only abnormal results are displayed)  Labs Reviewed  CBC WITH DIFFERENTIAL/PLATELET - Abnormal; Notable for the following components:      Result Value   RBC 3.79 (*)    Hemoglobin 12.1 (*)    HCT 33.8 (*)    All other components within normal limits  COMPREHENSIVE METABOLIC PANEL - Abnormal; Notable for the following components:   Glucose, Bld 134 (*)    BUN <5 (*)    Creatinine, Ser 0.58 (*)    Calcium 8.8 (*)    Albumin 3.3 (*)    AST 50 (*)    All other components within normal limits    PROCEDURES  Procedure(s) performed (including Critical Care):  Procedures   ____________________________________________   INITIAL IMPRESSION / ASSESSMENT AND PLAN / ED COURSE  As part of my medical decision making, I reviewed the following data within the electronic MEDICAL RECORD NUMBER Notes from prior ED visits and [place] Controlled Substance Database  43 year old male presents to the ED with complaint of erythema and warmth to his right cheek area.  He also complained of some itching and noted that this was worse this morning.  He denies any fever or chills.  Area is consistent with a cellulitis at this time.  Patient was given Decadron 10 mg and clindamycin 600 mg IV.  Patient was discharged with a prescription for prednisone and Bactrim to continue taking as he needs to buy this out of pocket.  He was also encouraged to use warm compresses to his face frequently.  He is to watch this area if any continued  worsening or not improving he is to return to the emergency department in 2 days.  ____________________________________________   FINAL CLINICAL IMPRESSION(S) / ED DIAGNOSES  Final diagnoses:  Cellulitis, face     ED Discharge Orders         Ordered    predniSONE (DELTASONE) 10 MG tablet     06/14/19 0944    sulfamethoxazole-trimethoprim (BACTRIM DS) 800-160 MG tablet  2 times daily     06/14/19 0944           Note:  This document was prepared using Dragon voice recognition software and may include unintentional dictation errors.    Johnn Hai, PA-C 06/14/19 1122    Lilia Pro., MD 06/14/19 [phone]

## 2019-06-14 NOTE — ED Triage Notes (Signed)
Pt presents to ED with worsening swelling just below his right eye since yesterday morning upon waking. Pt states this evening his right ear also began to swelling. Swelling, redness noted to affected area. +warm to touch. Unknown cause. Pt denies taking any medication to help relieve his symptoms.

## 2019-11-21 ENCOUNTER — Emergency Department: Payer: Self-pay

## 2019-11-21 ENCOUNTER — Encounter: Payer: Self-pay | Admitting: Emergency Medicine

## 2019-11-21 ENCOUNTER — Other Ambulatory Visit: Payer: Self-pay

## 2019-11-21 ENCOUNTER — Inpatient Hospital Stay
Admission: EM | Admit: 2019-11-21 | Discharge: 2019-11-28 | DRG: 101 | Disposition: A | Discharge status: Home Health | Payer: Self-pay | Attending: Internal Medicine | Admitting: Internal Medicine

## 2019-11-21 DIAGNOSIS — R569 Unspecified convulsions: Secondary | ICD-10-CM

## 2019-11-21 DIAGNOSIS — F10930 Alcohol use, unspecified with withdrawal, uncomplicated: Secondary | ICD-10-CM

## 2019-11-21 DIAGNOSIS — R001 Bradycardia, unspecified: Secondary | ICD-10-CM | POA: Diagnosis not present

## 2019-11-21 DIAGNOSIS — Y92008 Other place in unspecified non-institutional (private) residence as the place of occurrence of the external cause: Secondary | ICD-10-CM

## 2019-11-21 DIAGNOSIS — E876 Hypokalemia: Secondary | ICD-10-CM | POA: Diagnosis present

## 2019-11-21 DIAGNOSIS — F10239 Alcohol dependence with withdrawal, unspecified: Secondary | ICD-10-CM | POA: Diagnosis present

## 2019-11-21 DIAGNOSIS — G629 Polyneuropathy, unspecified: Secondary | ICD-10-CM | POA: Diagnosis present

## 2019-11-21 DIAGNOSIS — F1010 Alcohol abuse, uncomplicated: Secondary | ICD-10-CM | POA: Diagnosis present

## 2019-11-21 DIAGNOSIS — Z20822 Contact with and (suspected) exposure to covid-19: Secondary | ICD-10-CM | POA: Diagnosis present

## 2019-11-21 DIAGNOSIS — R748 Abnormal levels of other serum enzymes: Secondary | ICD-10-CM | POA: Diagnosis present

## 2019-11-21 DIAGNOSIS — R7401 Elevation of levels of liver transaminase levels: Secondary | ICD-10-CM | POA: Diagnosis present

## 2019-11-21 DIAGNOSIS — R519 Headache, unspecified: Secondary | ICD-10-CM | POA: Diagnosis present

## 2019-11-21 DIAGNOSIS — F17210 Nicotine dependence, cigarettes, uncomplicated: Secondary | ICD-10-CM | POA: Diagnosis present

## 2019-11-21 DIAGNOSIS — W1830XA Fall on same level, unspecified, initial encounter: Secondary | ICD-10-CM | POA: Diagnosis present

## 2019-11-21 DIAGNOSIS — K861 Other chronic pancreatitis: Secondary | ICD-10-CM | POA: Diagnosis present

## 2019-11-21 DIAGNOSIS — I10 Essential (primary) hypertension: Secondary | ICD-10-CM | POA: Diagnosis present

## 2019-11-21 DIAGNOSIS — F10230 Alcohol dependence with withdrawal, uncomplicated: Secondary | ICD-10-CM

## 2019-11-21 DIAGNOSIS — F419 Anxiety disorder, unspecified: Secondary | ICD-10-CM | POA: Diagnosis present

## 2019-11-21 DIAGNOSIS — G40909 Epilepsy, unspecified, not intractable, without status epilepticus: Principal | ICD-10-CM | POA: Diagnosis present

## 2019-11-21 DIAGNOSIS — I442 Atrioventricular block, complete: Secondary | ICD-10-CM | POA: Diagnosis present

## 2019-11-21 DIAGNOSIS — D6959 Other secondary thrombocytopenia: Secondary | ICD-10-CM | POA: Diagnosis present

## 2019-11-21 DIAGNOSIS — Y9301 Activity, walking, marching and hiking: Secondary | ICD-10-CM | POA: Diagnosis present

## 2019-11-21 DIAGNOSIS — Z8711 Personal history of peptic ulcer disease: Secondary | ICD-10-CM

## 2019-11-21 HISTORY — DX: Alcohol abuse, uncomplicated: F10.10

## 2019-11-21 LAB — CBC WITH DIFFERENTIAL/PLATELET
Abs Immature Granulocytes: 0.05 10*3/uL (ref 0.00–0.07)
Basophils Absolute: 0 10*3/uL (ref 0.0–0.1)
Basophils Relative: 0 %
Eosinophils Absolute: 0 10*3/uL (ref 0.0–0.5)
Eosinophils Relative: 0 %
HCT: 35.5 % — ABNORMAL LOW (ref 39.0–52.0)
Hemoglobin: 12.6 g/dL — ABNORMAL LOW (ref 13.0–17.0)
Immature Granulocytes: 1 %
Lymphocytes Relative: 15 %
Lymphs Abs: 1.1 10*3/uL (ref 0.7–4.0)
MCH: 32.1 pg (ref 26.0–34.0)
MCHC: 35.5 g/dL (ref 30.0–36.0)
MCV: 90.3 fL (ref 80.0–100.0)
Monocytes Absolute: 0.5 10*3/uL (ref 0.1–1.0)
Monocytes Relative: 7 %
Neutro Abs: 5.4 10*3/uL (ref 1.7–7.7)
Neutrophils Relative %: 77 %
Platelets: 115 10*3/uL — ABNORMAL LOW (ref 150–400)
RBC: 3.93 MIL/uL — ABNORMAL LOW (ref 4.22–5.81)
RDW: 12.4 % (ref 11.5–15.5)
Smear Review: DECREASED
WBC: 7.1 10*3/uL (ref 4.0–10.5)
nRBC: 0 % (ref 0.0–0.2)

## 2019-11-21 LAB — COMPREHENSIVE METABOLIC PANEL
ALT: 51 U/L — ABNORMAL HIGH (ref 0–44)
AST: 187 U/L — ABNORMAL HIGH (ref 15–41)
Albumin: 4.2 g/dL (ref 3.5–5.0)
Alkaline Phosphatase: 147 U/L — ABNORMAL HIGH (ref 38–126)
Anion gap: 16 — ABNORMAL HIGH (ref 5–15)
BUN: <5 mg/dL — ABNORMAL LOW (ref 6–20)
CO2: 25 mmol/L (ref 22–32)
Calcium: 9.3 mg/dL (ref 8.9–10.3)
Chloride: 98 mmol/L (ref 98–111)
Creatinine, Ser: 0.67 mg/dL (ref 0.61–1.24)
GFR calc Af Amer: >60 mL/min (ref >60)
GFR calc non Af Amer: >60 mL/min (ref >60)
Glucose, Bld: 150 mg/dL — ABNORMAL HIGH (ref 70–99)
Potassium: 3.7 mmol/L (ref 3.5–5.1)
Sodium: 139 mmol/L (ref 135–145)
Total Bilirubin: 1.1 mg/dL (ref 0.3–1.2)
Total Protein: 8.2 g/dL — ABNORMAL HIGH (ref 6.5–8.1)

## 2019-11-21 LAB — URINE DRUG SCREEN, QUALITATIVE (ARMC ONLY)
Amphetamines, Ur Screen: NOT DETECTED
Barbiturates, Ur Screen: NOT DETECTED
Benzodiazepine, Ur Scrn: NOT DETECTED
Cannabinoid 50 Ng, Ur ~~LOC~~: NOT DETECTED
Cocaine Metabolite,Ur ~~LOC~~: NOT DETECTED
MDMA (Ecstasy)Ur Screen: NOT DETECTED
Methadone Scn, Ur: NOT DETECTED
Opiate, Ur Screen: NOT DETECTED
Phencyclidine (PCP) Ur S: NOT DETECTED
Tricyclic, Ur Screen: NOT DETECTED

## 2019-11-21 LAB — ETHANOL: Alcohol, Ethyl (B): <10 mg/dL (ref <10)

## 2019-11-21 LAB — LIPASE, BLOOD: Lipase: 15 U/L (ref 11–51)

## 2019-11-21 LAB — MAGNESIUM: Magnesium: 1.2 mg/dL — ABNORMAL LOW (ref 1.7–2.4)

## 2019-11-21 IMAGING — CT CT CERVICAL SPINE W/O CM
3 of 4 series · 9 of 35 positions shown, 10 images · non-contrast
Comparison: [DATE]

CLINICAL DATA: Seizure-like activity, fall

EXAM:
CT HEAD WITHOUT CONTRAST
CT CERVICAL SPINE WITHOUT CONTRAST
TECHNIQUE: Multidetector CT imaging of the head and cervical spine was
performed following the standard protocol without intravenous
contrast. Multiplanar CT image reconstructions of the cervical spine
were also generated.

[Series 4: sagittal bone · sagittal · 0.19mm/px · 5 of 58 slices shown]
[im 20/58  bone]
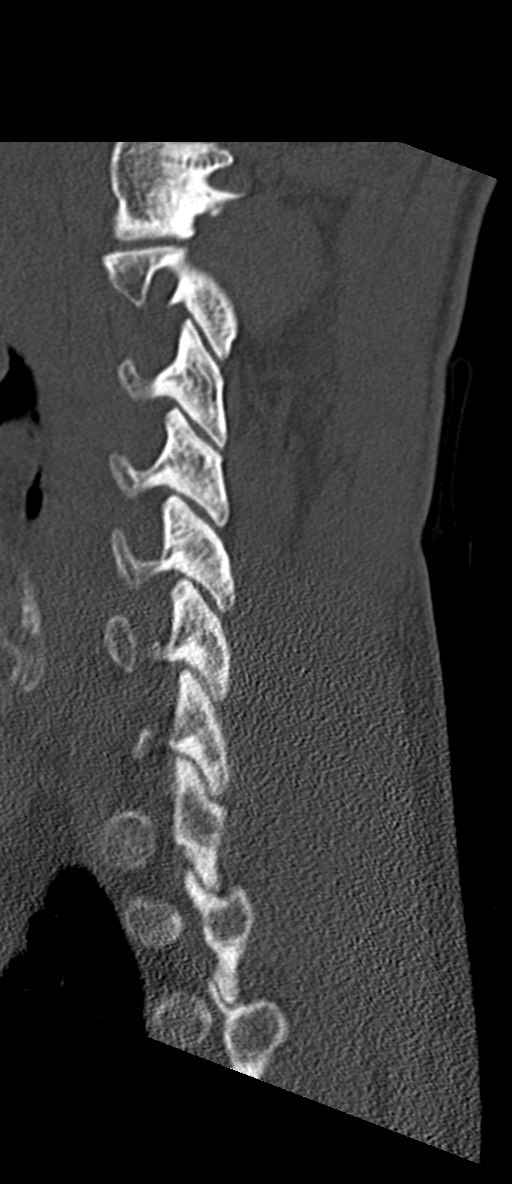
[im 24/58  bone]
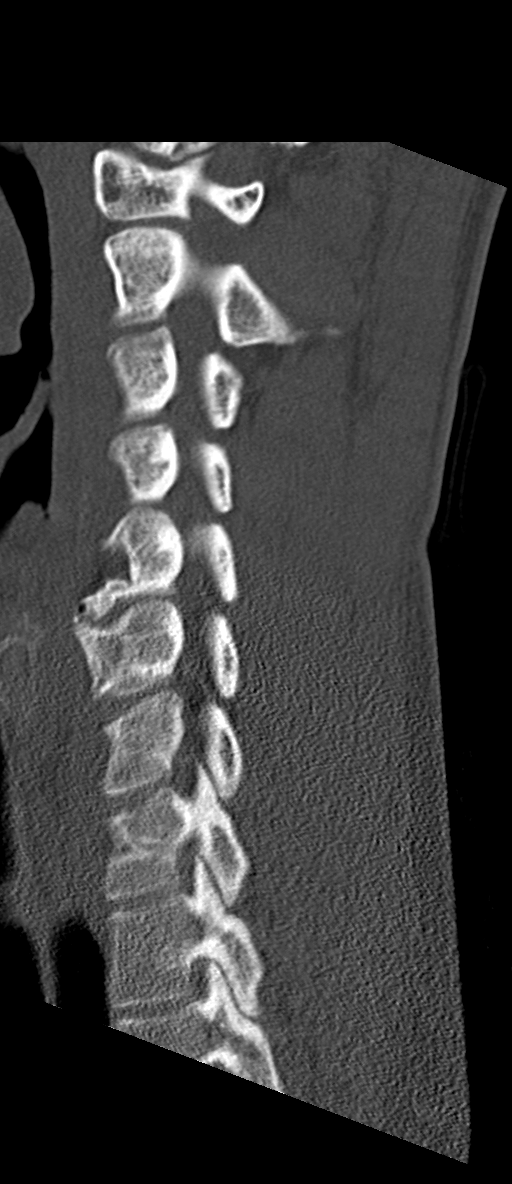
[im 29/58  bone]
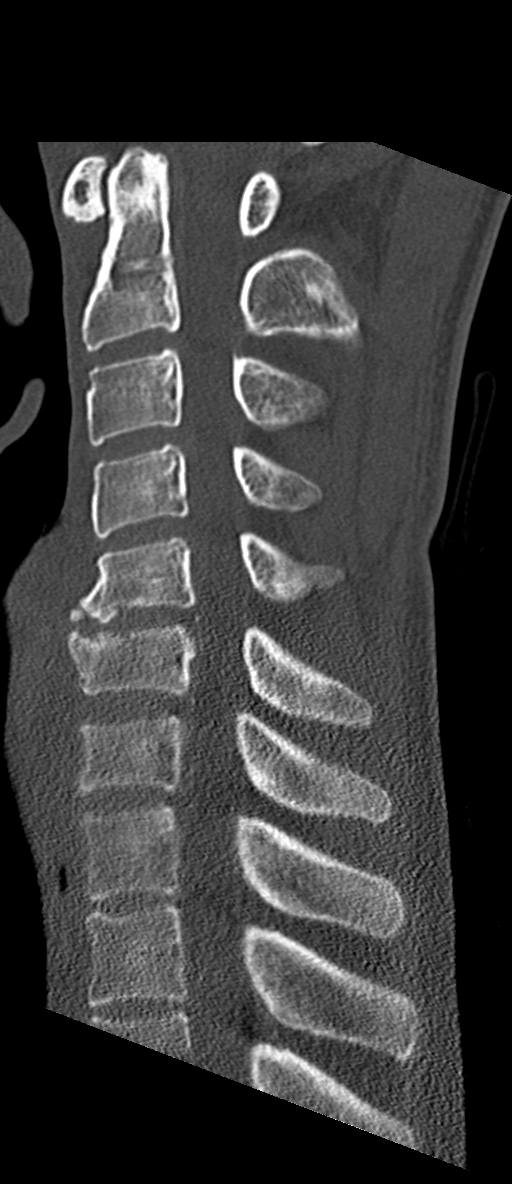
[im 34/58  bone]
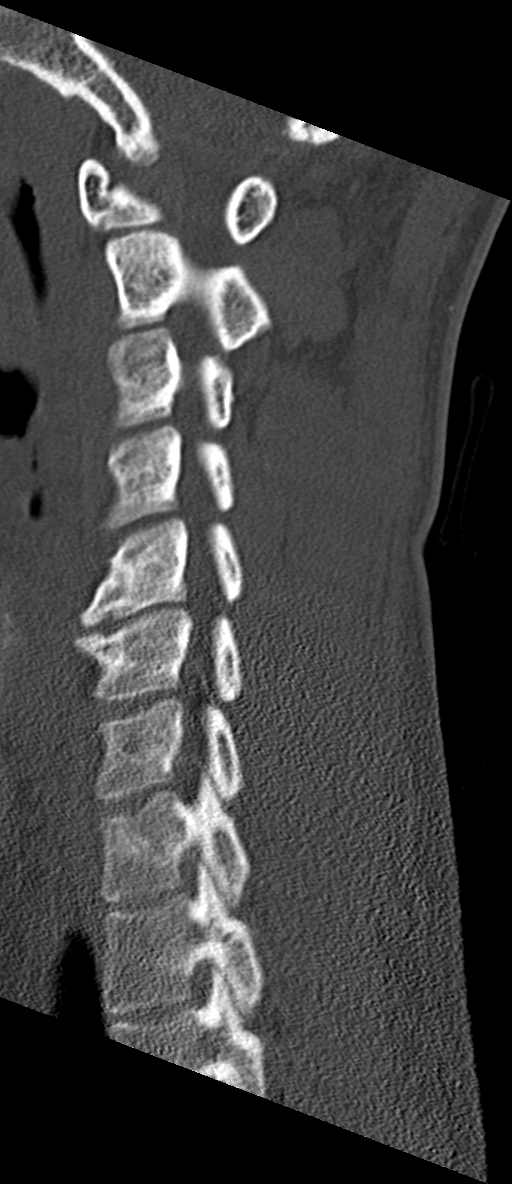
[im 39/58  bone]
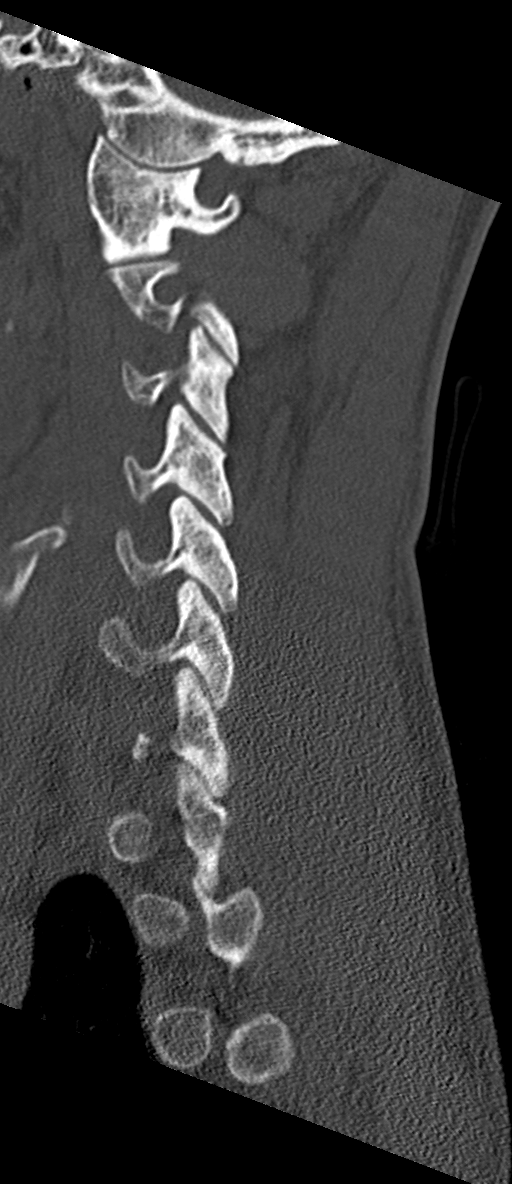

[Series 5: coronal bone · coronal · 0.22mm/px · 3 of 50 slices shown]
[im 10/50  bone]
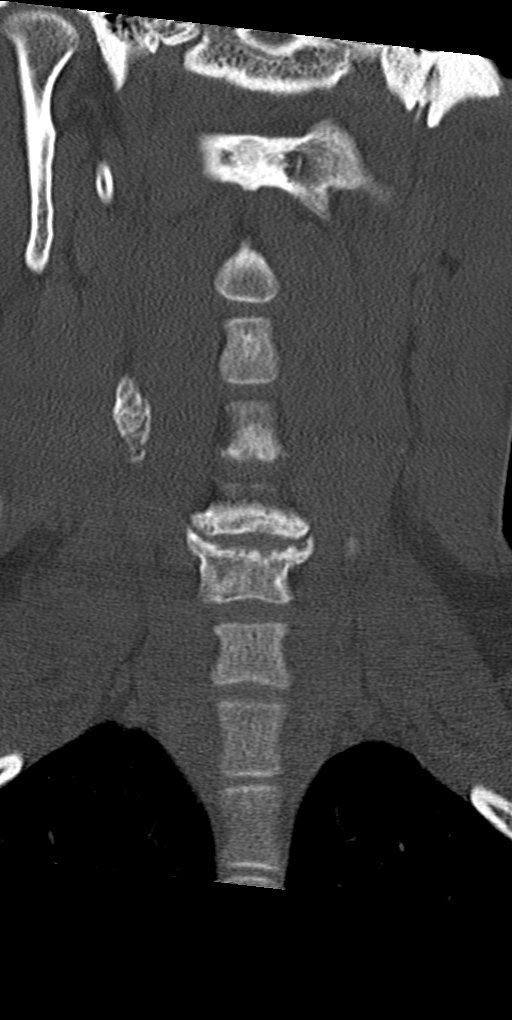
[im 20/50  bone]
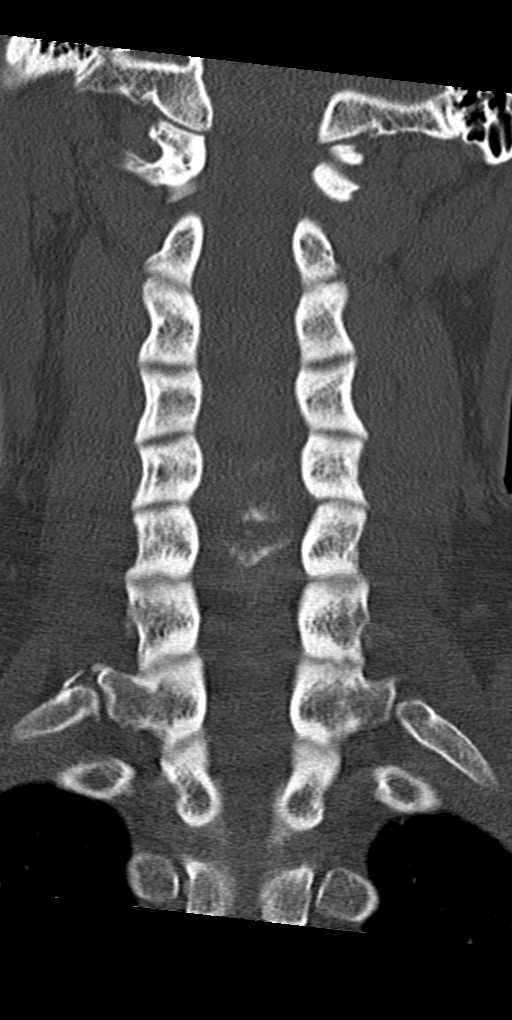
[im 30/50  bone]
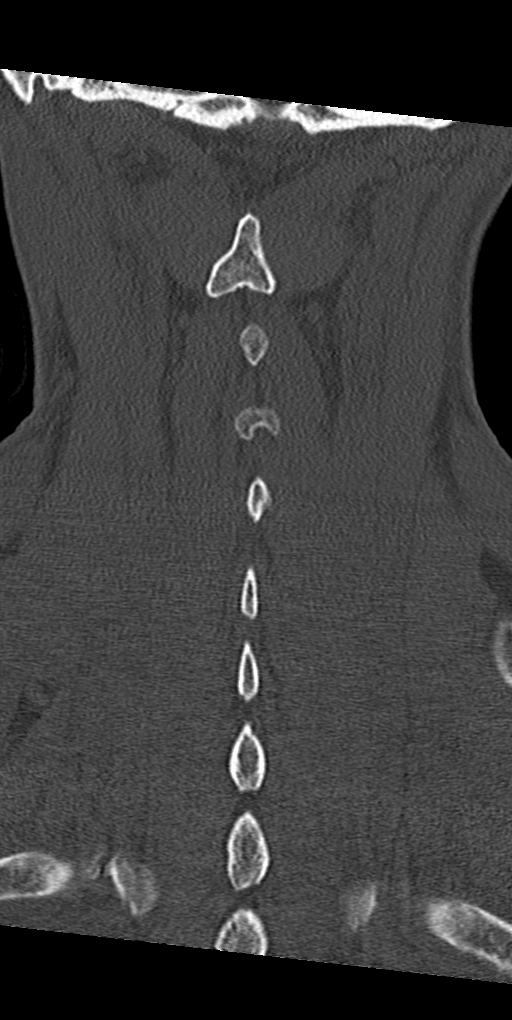

[Series 6: orthogonal bone · axial · 0.19mm/px · z∈[-63,-63]mm · 1 of 115 slices shown, 2 images]
[im 66/115  soft-tissue]
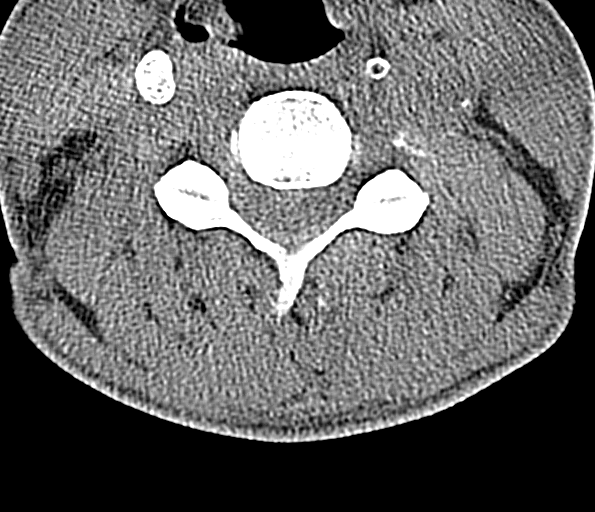
[im 66/115  bone]
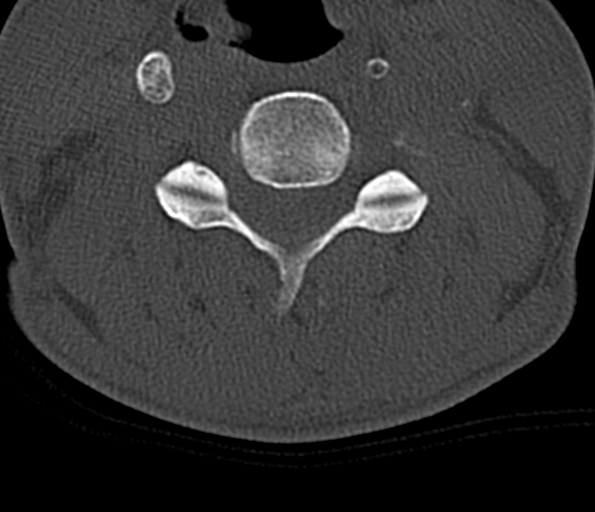

[9 of 35 positions shown; findings below may reference images not displayed]

FINDINGS: CT HEAD FINDINGS

Brain: No evidence of acute infarction, hemorrhage, hydrocephalus,
extra-axial collection or mass lesion/mass effect.

Vascular: No hyperdense vessel or unexpected calcification.

Skull: Normal. Negative for fracture or focal lesion.

Sinuses/Orbits: No acute finding.

Other: Soft tissue contusion about the left temporal fossa (series
3, image 10).

CT CERVICAL SPINE FINDINGS

Alignment: Straightening of the normal cervical lordosis.

Skull base and vertebrae: No acute fracture. No primary bone lesion
or focal pathologic process.

Soft tissues and spinal canal: No prevertebral fluid or swelling. No
visible canal hematoma.

Disc levels: Focally mild disc space height loss with anterior
osteophytosis at C5-C6.

Upper chest: Mild centrilobular emphysema.

Other: None.
IMPRESSION: 1.  No acute intracranial pathology.

2.  Soft tissue contusion about the left temporal fossa.

3.  No fracture or static subluxation of the cervical spine.

4.  Emphysema ([28]-[28]).

## 2019-11-21 IMAGING — CT CT HEAD W/O CM
4 series · 16 of 47 positions shown, 18 images · non-contrast
Comparison: [DATE]

CLINICAL DATA: Seizure-like activity, fall

EXAM:
CT HEAD WITHOUT CONTRAST
CT CERVICAL SPINE WITHOUT CONTRAST
TECHNIQUE: Multidetector CT imaging of the head and cervical spine was
performed following the standard protocol without intravenous
contrast. Multiplanar CT image reconstructions of the cervical spine
were also generated.

[Series 2: head bone · axial · 0.43mm/px · z∈[+43,+73]mm · 3 of 75 slices shown]
[im 8/75  bone]
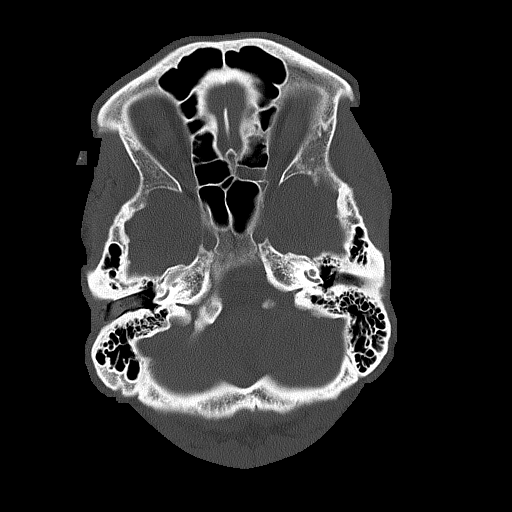
[im 15/75  bone]
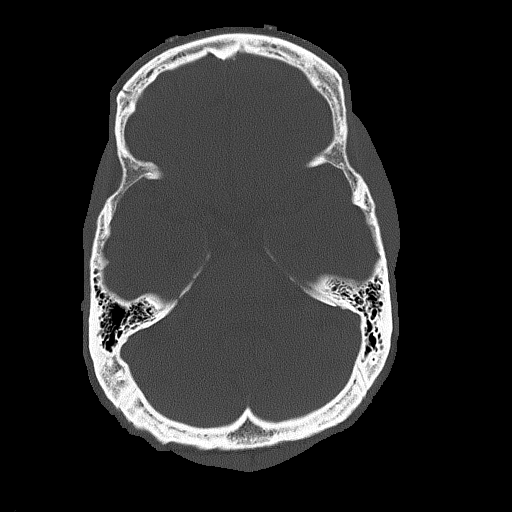
[im 23/75  bone]
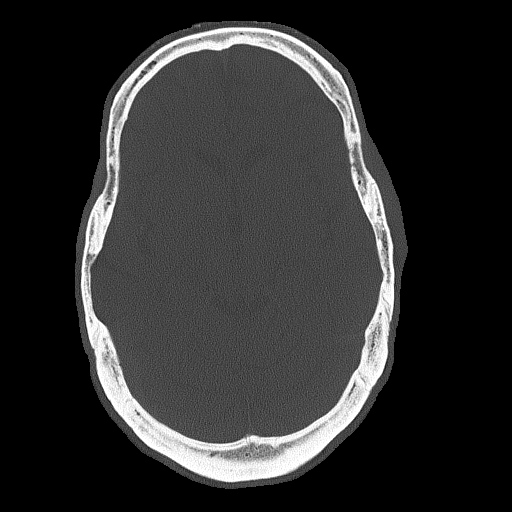

[Series 3: head wo · axial · 0.43mm/px · z∈[+44,+154]mm · 7 of 30 slices shown, 9 images]
[im 4/30  brain]
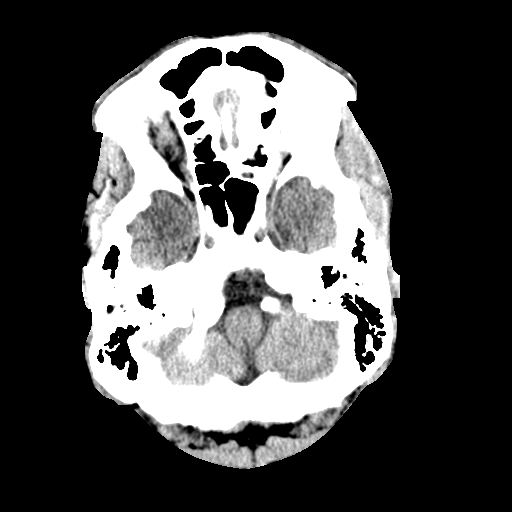
[im 4/30  bone]
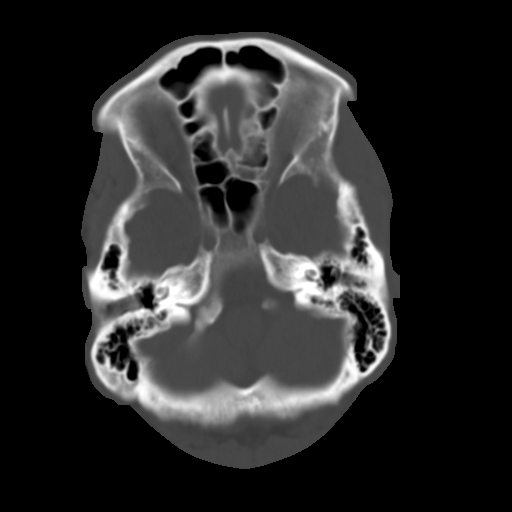
[im 8/30  brain]
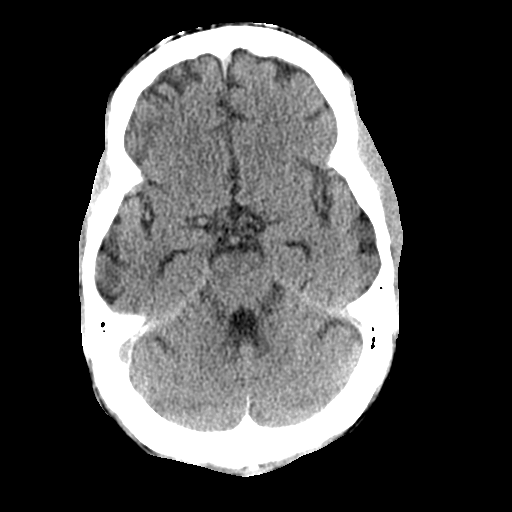
[im 11/30  brain]
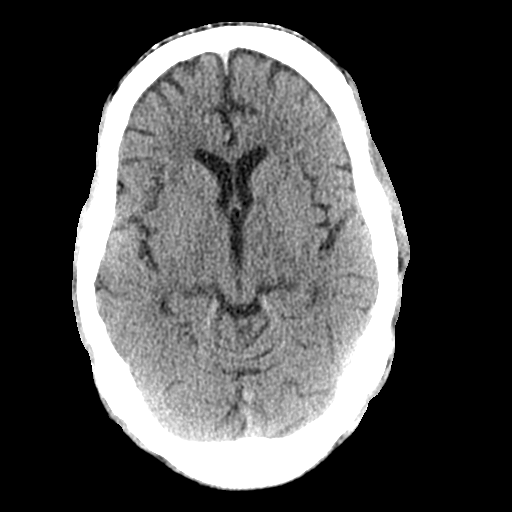
[im 15/30  brain]
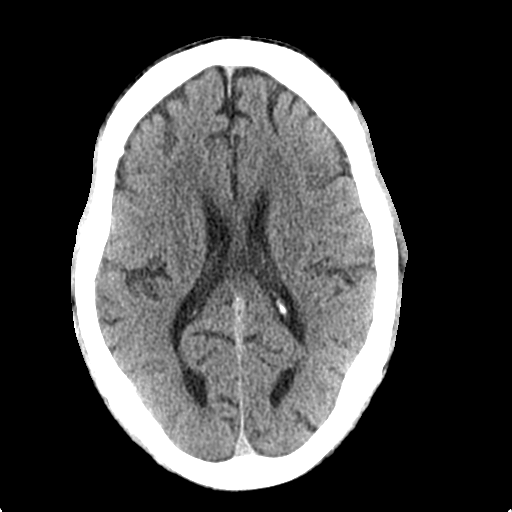
[im 19/30  brain]
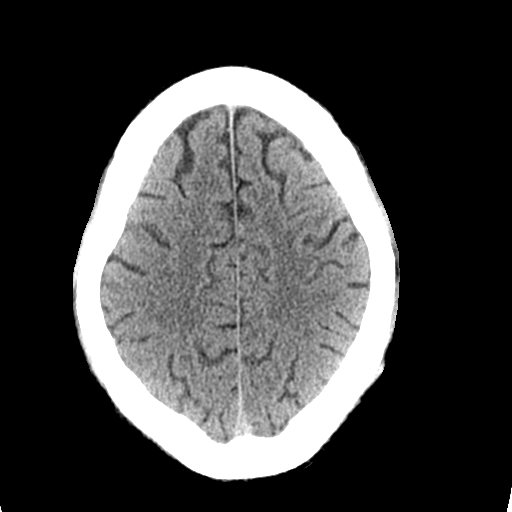
[im 19/30  bone]
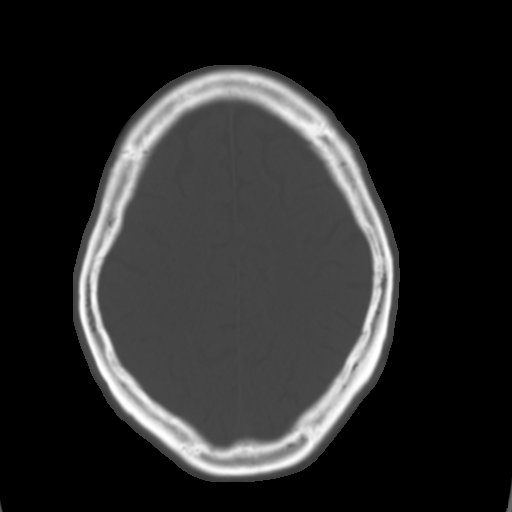
[im 22/30  brain]
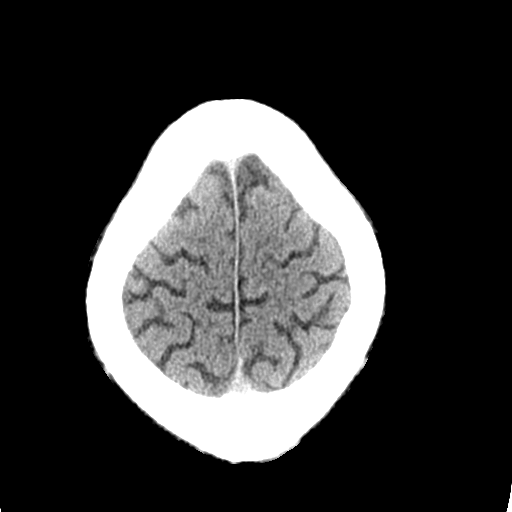
[im 26/30  brain]
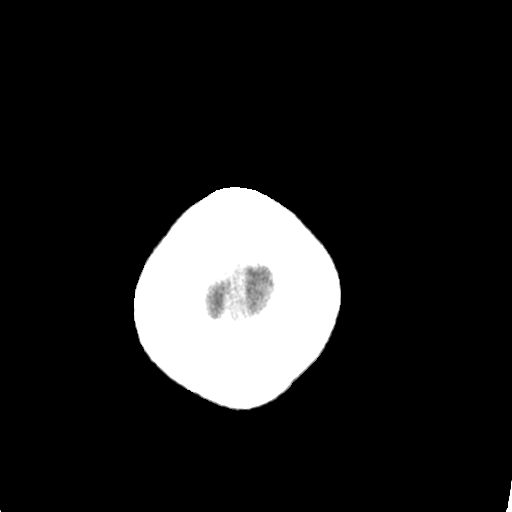

[Series 4: coronal soft tissue · coronal · 0.29mm/px · 3 of 70 slices shown]
[im 24/70  brain]
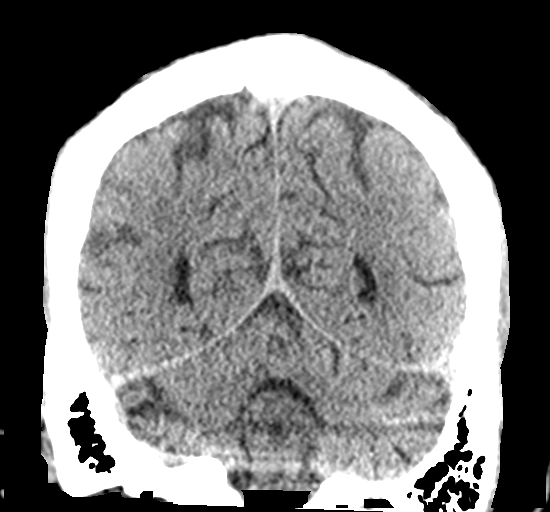
[im 31/70  brain]
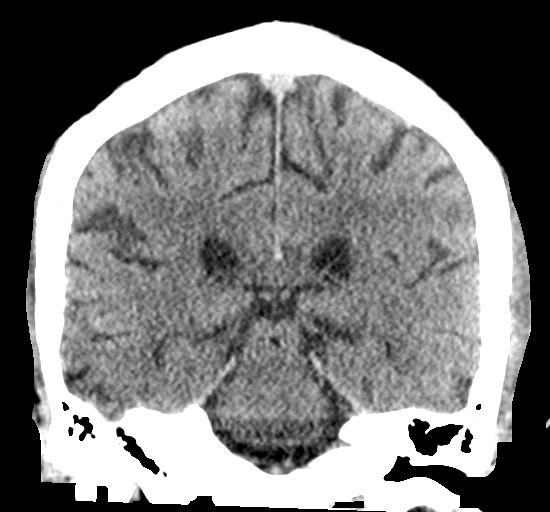
[im 39/70  brain]
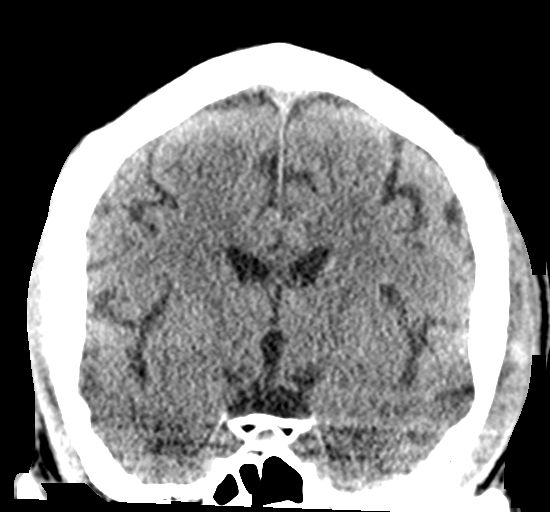

[Series 5: sagittal soft tissue · sagittal · 0.29mm/px · 3 of 54 slices shown]
[im 18/54  brain]
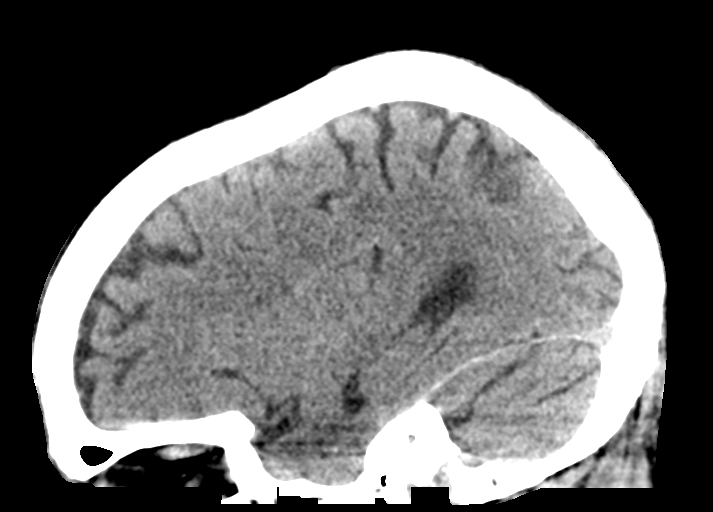
[im 27/54  brain]
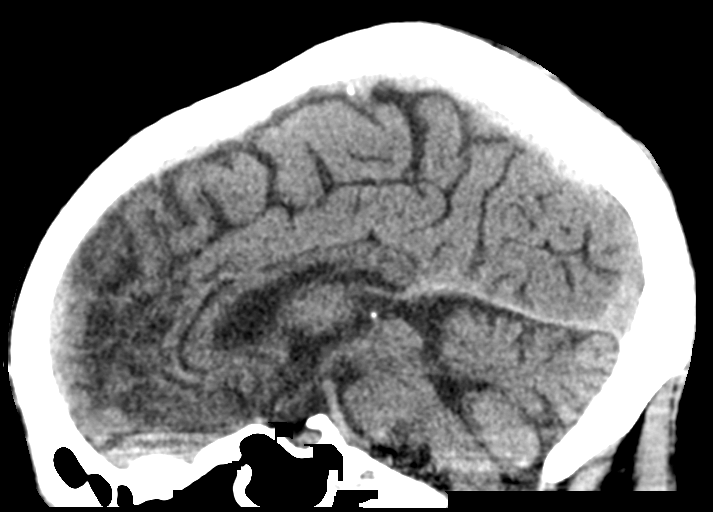
[im 36/54  brain]
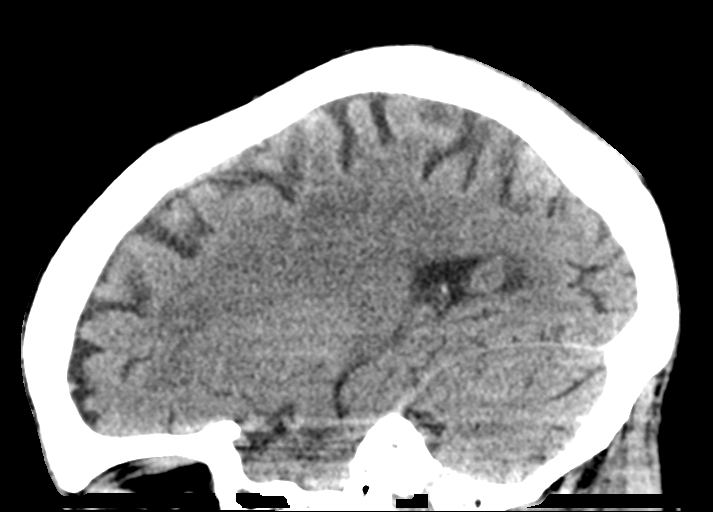

[16 of 47 positions shown; findings below may reference images not displayed]

FINDINGS: CT HEAD FINDINGS

Brain: No evidence of acute infarction, hemorrhage, hydrocephalus,
extra-axial collection or mass lesion/mass effect.

Vascular: No hyperdense vessel or unexpected calcification.

Skull: Normal. Negative for fracture or focal lesion.

Sinuses/Orbits: No acute finding.

Other: Soft tissue contusion about the left temporal fossa (series
3, image 10).

CT CERVICAL SPINE FINDINGS

Alignment: Straightening of the normal cervical lordosis.

Skull base and vertebrae: No acute fracture. No primary bone lesion
or focal pathologic process.

Soft tissues and spinal canal: No prevertebral fluid or swelling. No
visible canal hematoma.

Disc levels: Focally mild disc space height loss with anterior
osteophytosis at C5-C6.

Upper chest: Mild centrilobular emphysema.

Other: None.
IMPRESSION: 1.  No acute intracranial pathology.

2.  Soft tissue contusion about the left temporal fossa.

3.  No fracture or static subluxation of the cervical spine.

4.  Emphysema ([28]-[28]).

## 2019-11-21 MED ORDER — FOLIC ACID 1 MG PO TABS
1.0000 mg | ORAL_TABLET | Freq: Every day | ORAL | Status: DC
  Administered 2019-11-22 – 2019-11-28 (×7): 1 mg via ORAL
  Filled 2019-11-21 (×7): qty 1

## 2019-11-21 MED ORDER — ONDANSETRON HCL 4 MG/2ML IJ SOLN: 4.0000 mg | Freq: Four times a day (QID) | INTRAMUSCULAR | Status: DC | PRN

## 2019-11-21 MED ORDER — LORAZEPAM 2 MG/ML IJ SOLN
1.0000 mg | Freq: Once | INTRAMUSCULAR | Status: AC
  Administered 2019-11-21: 1 mg via INTRAVENOUS
  Filled 2019-11-21: qty 1

## 2019-11-21 MED ORDER — LORAZEPAM 2 MG/ML IJ SOLN
0.0000 mg | Freq: Four times a day (QID) | INTRAMUSCULAR | Status: AC
  Administered 2019-11-21 – 2019-11-22 (×2): 2 mg via INTRAVENOUS
  Administered 2019-11-22: 1 mg via INTRAVENOUS
  Administered 2019-11-23: 2 mg via INTRAVENOUS
  Filled 2019-11-21 (×6): qty 1

## 2019-11-21 MED ORDER — MAGNESIUM SULFATE 2 GM/50ML IV SOLN
2.0000 g | Freq: Once | INTRAVENOUS | Status: AC
  Administered 2019-11-21: 18:00:00 2 g via INTRAVENOUS
  Filled 2019-11-21: qty 50

## 2019-11-21 MED ORDER — LORAZEPAM 2 MG/ML IJ SOLN
0.0000 mg | Freq: Two times a day (BID) | INTRAMUSCULAR | Status: DC
  Administered 2019-11-24 – 2019-11-25 (×2): 2 mg via INTRAVENOUS
  Filled 2019-11-21 (×3): qty 1

## 2019-11-21 MED ORDER — SODIUM CHLORIDE 0.9 % IV SOLN
2000.0000 mg | Freq: Once | INTRAVENOUS | Status: AC
  Administered 2019-11-21: 2000 mg via INTRAVENOUS
  Filled 2019-11-21: qty 20

## 2019-11-21 MED ORDER — ADULT MULTIVITAMIN W/MINERALS CH
1.0000 | ORAL_TABLET | Freq: Every day | ORAL | Status: DC
  Administered 2019-11-22 – 2019-11-28 (×7): 1 via ORAL
  Filled 2019-11-21 (×7): qty 1

## 2019-11-21 MED ORDER — THIAMINE HCL 100 MG/ML IJ SOLN
100.0000 mg | Freq: Once | INTRAMUSCULAR | Status: AC
  Administered 2019-11-21: 18:00:00 100 mg via INTRAVENOUS
  Filled 2019-11-21: qty 2

## 2019-11-21 MED ORDER — LORAZEPAM 2 MG/ML IJ SOLN: 2.0000 mg | Freq: Once | INTRAMUSCULAR | Status: DC

## 2019-11-21 MED ORDER — THIAMINE HCL 100 MG PO TABS
100.0000 mg | ORAL_TABLET | Freq: Every day | ORAL | Status: DC
  Administered 2019-11-22 – 2019-11-26 (×5): 100 mg via ORAL
  Filled 2019-11-21 (×5): qty 1

## 2019-11-21 MED ORDER — ACETAMINOPHEN 500 MG PO TABS
1000.0000 mg | ORAL_TABLET | Freq: Once | ORAL | Status: AC
  Administered 2019-11-21: 23:00:00 1000 mg via ORAL
  Filled 2019-11-21: qty 2

## 2019-11-21 MED ORDER — LEVETIRACETAM 750 MG PO TABS
750.0000 mg | ORAL_TABLET | Freq: Two times a day (BID) | ORAL | Status: DC
  Administered 2019-11-22 – 2019-11-24 (×6): 750 mg via ORAL
  Filled 2019-11-21 (×7): qty 1

## 2019-11-21 MED ORDER — DEXTROSE-NACL 5-0.9 % IV SOLN: INTRAVENOUS | Status: DC

## 2019-11-21 MED ORDER — ONDANSETRON HCL 4 MG PO TABS: 4.0000 mg | ORAL_TABLET | Freq: Four times a day (QID) | ORAL | Status: DC | PRN

## 2019-11-21 MED ORDER — LORAZEPAM 1 MG PO TABS
1.0000 mg | ORAL_TABLET | ORAL | Status: AC | PRN
  Administered 2019-11-22 (×2): 1 mg via ORAL
  Administered 2019-11-23: 4 mg via ORAL
  Filled 2019-11-21: qty 4
  Filled 2019-11-21: qty 2
  Filled 2019-11-21: qty 4
  Filled 2019-11-21: qty 1

## 2019-11-21 MED ORDER — THIAMINE HCL 100 MG/ML IJ SOLN: 100.0000 mg | Freq: Every day | INTRAMUSCULAR | Status: DC

## 2019-11-21 MED ORDER — HEPARIN SODIUM (PORCINE) 5000 UNIT/ML IJ SOLN
5000.0000 [IU] | Freq: Three times a day (TID) | INTRAMUSCULAR | Status: DC
  Administered 2019-11-22 – 2019-11-23 (×3): 5000 [IU] via SUBCUTANEOUS
  Filled 2019-11-21 (×5): qty 1

## 2019-11-21 MED ORDER — LORAZEPAM 2 MG/ML IJ SOLN
2.0000 mg | Freq: Once | INTRAMUSCULAR | Status: AC
  Administered 2019-11-21: 16:00:00 2 mg via INTRAVENOUS
  Filled 2019-11-21: qty 1

## 2019-11-21 MED ORDER — ACETAMINOPHEN 325 MG PO TABS: 650.0000 mg | ORAL_TABLET | Freq: Once | ORAL | Status: DC

## 2019-11-21 MED ORDER — LORAZEPAM 2 MG/ML IJ SOLN
1.0000 mg | INTRAMUSCULAR | Status: AC | PRN
  Administered 2019-11-22 – 2019-11-23 (×5): 2 mg via INTRAVENOUS
  Administered 2019-11-23 (×2): 4 mg via INTRAVENOUS
  Administered 2019-11-24 (×4): 2 mg via INTRAVENOUS
  Administered 2019-11-24: 4 mg via INTRAVENOUS
  Administered 2019-11-24 (×2): 2 mg via INTRAVENOUS
  Filled 2019-11-21: qty 1
  Filled 2019-11-21 (×2): qty 2
  Filled 2019-11-21: qty 1
  Filled 2019-11-21: qty 2
  Filled 2019-11-21: qty 1
  Filled 2019-11-21: qty 2
  Filled 2019-11-21 (×3): qty 1
  Filled 2019-11-21: qty 2

## 2019-11-21 MED ORDER — SODIUM CHLORIDE 0.9 % IV BOLUS
1000.0000 mL | Freq: Once | INTRAVENOUS | Status: AC
  Administered 2019-11-21: 18:00:00 1000 mL via INTRAVENOUS

## 2019-11-21 MED ORDER — KETOROLAC TROMETHAMINE 30 MG/ML IJ SOLN
15.0000 mg | Freq: Once | INTRAMUSCULAR | Status: AC
  Administered 2019-11-21: 19:00:00 15 mg via INTRAVENOUS
  Filled 2019-11-21: qty 1

## 2019-11-21 NOTE — ED Notes (Signed)
Pt resting quietly. Pt is able to use the urinal without assistance.

## 2019-11-21 NOTE — Consult Note (Signed)
[facility] Consult Services  Stat Consult  Date of Service:   11/21/2019 21:06:48  Impression:     .  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 Although there was some variance in the initial history as to whether all the seizures occurred in the setting of alcohol cessation for a couple days are not. Because of that I think would be reasonable to get an MRI of his head since he has never had 1 of those in any and then when he is more awake the go over the history again whether all this seizures have occurred in the pattern which one would expect for alcohol withdrawal seizures or not which will help determine along with those other 2 tests whether he should be discharged on anticonvulsant medicine or not  CT HEAD:  IMPRESSION: 1. No acute intracranial pathology.   2. Soft tissue contusion about the left temporal fossa.   3. No fracture or static subluxation of the cervical spine.   4. Emphysema (ICD10-J43.9).     Electronically Signed By: Lauralyn Primes M.D. On: 11/21/2019 16:19  Metrics: [facility] Notification Time: 11/21/2019 94:[id number] Stamp Time: 11/21/2019 21:06:48 Callback Response Time: 11/21/2019 21:12:14  Our recommendations are outlined below.  Recommendations:     .  eeg  Imaging Studies:     .  MRI Head  Disposition: Neurology Follow Up Recommended  Sign Out:     .  Discussed with Emergency Department Provider  ----------------------------------------------------------------------------------------------------  Chief Complaint: seizure  History of Present Illness: Patient is a 44 year old Male.  44 yo male with a history of seizures who 3 seizures today. He reports all of his seizures have occured in the setting of stopping alcohol for a day or more. He does not thank that he has ever had an MRI of his head or any. He has not been on seizure medicines in the past. He feels like he is  recuperating today although he still has a headache. He last had alcohol yesterday. He generally drinks about 160 ounces of beer a day.       Examination: 1A: Level of Consciousness - Alert; keenly responsive + 0 1B: Ask Month and Age - Both Questions Right + 0 1C: Blink Eyes & Squeeze Hands - Performs Both Tasks + 0 2: Test Horizontal Extraocular Movements - Normal + 0 3: Test Visual Fields - No Visual Loss + 0 4: Test Facial Palsy (Use Grimace if Obtunded) - Normal symmetry + 0 5A: Test Left Arm Motor Drift - No Drift for 10 Seconds + 0 5B: Test Right Arm Motor Drift - No Drift for 10 Seconds + 0 6A: Test Left Leg Motor Drift - No Drift for 5 Seconds + 0 6B: Test Right Leg Motor Drift - No Drift for 5 Seconds + 0 7: Test Limb Ataxia (FNF/Heel-Shin) - No Ataxia + 0 8: Test Sensation - Normal; No sensory loss + 0 9: Test Language/Aphasia - Normal; No aphasia + 0 10: Test Dysarthria - Normal + 0 11: Test Extinction/Inattention - No abnormality + 0  NIHSS Score: 0   Due to the immediate potential for life-threatening deterioration due to underlying acute neurologic illness, I spent 20 minutes providing critical care. This time includes time for face to face visit via telemedicine, review of medical records, imaging studies and discussion of findings with providers, the patient and/or family.   Dr Georgina Snell   [facility] [phone]  Case [id number]

## 2019-11-21 NOTE — ED Notes (Signed)
Mag drip adjusted per EDP orders. Pt c/o continued HA at this time. Pt provided with urinal.

## 2019-11-21 NOTE — ED Notes (Signed)
Medications administered per MD order. Pt changed from wet pants and wet sheets to clean/dry pants at this time and clean sheets. Pt offered urinal at this time. Pt refused. EDP updated regarding patient care.

## 2019-11-21 NOTE — ED Triage Notes (Signed)
Pt presents to ED via [facility] from home with c/o witnessed seizure-like activity after walking outside, per EMS pt walked outside and fell down having a seizure. Per EMS 2nd seizure in 6 months, pt presents post-ictal per EMS, reports that patient has chronic alcohol use however has not had any ETOH today.    Upon arrival pt is alert and oriented, able to answer questions, reports normally drinks 4 40oz beers/day, last drink 2/14 at approx 10pm.

## 2019-11-21 NOTE — ED Notes (Signed)
Neurologist on the monitor with this RN at the bedside. Pt speaking with neuro about seizure activity being mostly related to etoh withdrawal.

## 2019-11-21 NOTE — ED Notes (Signed)
Pt given a food tray and speaking with family.

## 2019-11-21 NOTE — ED Notes (Signed)
Pt returned from CT at this time. Seizure pads placed on either side of bed at this time for patient safety. Pt remains in direct sight of staff in 11H. Pt resting in bed with eyes closed.

## 2019-11-21 NOTE — H&P (Signed)
History and Physical   Lance Green FYB:[id number] DOB: 02-26-1976 DOA: 11/21/2019  Referring MD/NP/PA: Dr. Erma Heritage  PCP: Patient, No Pcp Per   Outpatient Specialists: None  Patient coming from: Home  Chief Complaint: Seizure and headache  HPI: Lance Green is a 44 y.o. male with medical history significant of chronic alcoholism, peptic ulcer disease, alcoholic pancreatitis and neuropathy who presented to the ER with multiple episodes of seizures.  Patient was observed to have seizure in the ER twice.  No prior history of seizure disorder.  He apparently stopped drinking abruptly 2 days ago.  He did not drink yesterday and today.  Patient has been a heavy drinker prior to that.  He is currently somnolent after treatment in the ER.  He is arousable and able to communicate.  Palpation he has not had any head injuries.  No sick contacts.  Patient suspected to have alcohol withdrawal seizure but needs to rule out other idiopathic seizure disorder..  ED Course: Temperature 98.6 blood pressure 160/85 pulse 82 respiratory 20 oxygen sat 100% room air.  Chemistry largely normal except for magnesium 1.2.  Lipase of 15.  AST 187 ALT 51 and total protein 8.2.  CBC showed hemoglobin 12.6 otherwise all within normal.  And neck shows no acute findings.  There is some soft tissue contusion about the left temporal fossa where patient is having headaches.  Patient had urine drug screen that is essentially negative.  He has been loaded with Keppra and is being admitted for further work-up  Review of Systems: As per HPI otherwise 10 point review of systems negative.    Past Medical History:  Diagnosis Date  . Alcohol abuse   . Neuropathy   . Pancreatitis   . Peptic ulcer disease     Past Surgical History:  Procedure Laterality Date  . APPENDECTOMY    . INCISION AND DRAINAGE Left 11/11/2018   Procedure: INCISION AND DRAINAGE;  Surgeon: Donato Heinz, MD;  Location: [facility] ORS;  Service: Orthopedics;   Laterality: Left;  . LAPAROSCOPIC APPENDECTOMY N/A 07/06/2018   Procedure: APPENDECTOMY LAPAROSCOPIC;  Surgeon: Carolan Shiver, MD;  Location: [facility] ORS;  Service: General;  Laterality: N/A;     reports that he has been smoking cigarettes. He has been smoking about 0.50 packs per day. He has never used smokeless tobacco. He reports current alcohol use. He reports that he does not use drugs.  No Known Allergies  History reviewed. No pertinent family history.   Prior to Admission medications   Medication Sig Start Date End Date Taking? Authorizing Provider  predniSONE (DELTASONE) 10 MG tablet Take 6 tablets  today, on day 2 take 5 tablets, day 3 take 4 tablets, day 4 take 3 tablets, day 5 take  2 tablets and 1 tablet the last day Patient not taking: Reported on 11/21/2019 06/14/19   Tommi Rumps, PA-C  sulfamethoxazole-trimethoprim (BACTRIM DS) 800-160 MG tablet Take 1 tablet by mouth 2 (two) times daily. Patient not taking: Reported on 11/21/2019 06/14/19   Tommi Rumps, PA-C    Physical Exam: Vitals:   11/21/19 1558 11/21/19 1600 11/21/19 1743 11/21/19 2233  BP:  (!) 180/116 (!) 193/99 (!) 160/85  Pulse:  70 64 82  Resp:   (!) 21 20  Temp:      TempSrc:      SpO2:   100% 100%  Weight: 68 kg     Height: 5\' 7"  (1.702 m)  Constitutional: Confused, tremulous Vitals:   11/21/19 1558 11/21/19 1600 11/21/19 1743 11/21/19 2233  BP:  (!) 180/116 (!) 193/99 (!) 160/85  Pulse:  70 64 82  Resp:   (!) 21 20  Temp:      TempSrc:      SpO2:   100% 100%  Weight: 68 kg     Height: 5\' 7"  (1.702 m)      Eyes: PERRL, lids and conjunctivae normal ENMT: Mucous membranes are moist. Posterior pharynx clear of any exudate or lesions.Normal dentition.  Left temporal area swelling and tenderness Neck: normal, supple, no masses, no thyromegaly Respiratory: clear to auscultation bilaterally, no wheezing, no crackles. Normal respiratory effort. No accessory muscle use.    Cardiovascular: Sinus tachycardia no murmurs / rubs / gallops. No extremity edema. 2+ pedal pulses. No carotid bruits.  Abdomen: no tenderness, no masses palpated. No hepatosplenomegaly. Bowel sounds positive.  Musculoskeletal: no clubbing / cyanosis. No joint deformity upper and lower extremities. Good ROM, no contractures. Normal muscle tone.  Skin: no rashes, lesions, ulcers. No induration Neurologic: CN 2-12 grossly intact. Sensation intact, DTR normal. Strength 5/5 in all 4.  Psychiatric: Normal judgment and insight. Alert and oriented x 3. Normal mood.     Labs on Admission: I have personally reviewed following labs and imaging studies  CBC: Recent Labs  Lab 11/21/19 1539  WBC 7.1  NEUTROABS 5.4  HGB 12.6*  HCT 35.5*  MCV 90.3  PLT 115*   Basic Metabolic Panel: Recent Labs  Lab 11/21/19 1539  NA 139  K 3.7  CL 98  CO2 25  GLUCOSE 150*  BUN <5*  CREATININE 0.67  CALCIUM 9.3  MG 1.2*   GFR: Estimated Creatinine Clearance: 109 mL/min (by C-G formula based on SCr of 0.67 mg/dL). Liver Function Tests: Recent Labs  Lab 11/21/19 1539  AST 187*  ALT 51*  ALKPHOS 147*  BILITOT 1.1  PROT 8.2*  ALBUMIN 4.2   Recent Labs  Lab 11/21/19 1638  LIPASE 15   No results for input(s): AMMONIA in the last 168 hours. Coagulation Profile: No results for input(s): INR, PROTIME in the last 168 hours. Cardiac Enzymes: No results for input(s): CKTOTAL, CKMB, CKMBINDEX, TROPONINI in the last 168 hours. BNP (last 3 results) No results for input(s): PROBNP in the last 8760 hours. HbA1C: No results for input(s): HGBA1C in the last 72 hours. CBG: No results for input(s): GLUCAP in the last 168 hours. Lipid Profile: No results for input(s): CHOL, HDL, LDLCALC, TRIG, CHOLHDL, LDLDIRECT in the last 72 hours. Thyroid Function Tests: No results for input(s): TSH, T4TOTAL, FREET4, T3FREE, THYROIDAB in the last 72 hours. Anemia Panel: No results for input(s): VITAMINB12,  FOLATE, FERRITIN, TIBC, IRON, RETICCTPCT in the last 72 hours. Urine analysis:    Component Value Date/Time   COLORURINE YELLOW (A) 07/06/2018 1354   APPEARANCEUR CLEAR (A) 07/06/2018 1354   APPEARANCEUR Hazy 01/18/2014 1701   LABSPEC 1.013 07/06/2018 1354   LABSPEC 1.013 01/18/2014 1701   PHURINE 5.0 07/06/2018 1354   GLUCOSEU NEGATIVE 07/06/2018 1354   GLUCOSEU Negative 01/18/2014 1701   HGBUR SMALL (A) 07/06/2018 1354   BILIRUBINUR NEGATIVE 07/06/2018 1354   BILIRUBINUR Negative 01/18/2014 1701   KETONESUR 20 (A) 07/06/2018 1354   PROTEINUR 100 (A) 07/06/2018 1354   NITRITE NEGATIVE 07/06/2018 1354   LEUKOCYTESUR NEGATIVE 07/06/2018 1354   LEUKOCYTESUR Negative 01/18/2014 1701   Sepsis Labs: @LABRCNTIP (procalcitonin:4,lacticidven:4) )No results found for this or any previous visit (from the past 240  hour(s)).   Radiological Exams on Admission: CT Head Wo Contrast  Result Date: 11/21/2019 CLINICAL DATA:  Seizure-like activity, fall EXAM: CT HEAD WITHOUT CONTRAST CT CERVICAL SPINE WITHOUT CONTRAST TECHNIQUE: Multidetector CT imaging of the head and cervical spine was performed following the standard protocol without intravenous contrast. Multiplanar CT image reconstructions of the cervical spine were also generated. COMPARISON:  08/07/2018 FINDINGS: CT HEAD FINDINGS Brain: No evidence of acute infarction, hemorrhage, hydrocephalus, extra-axial collection or mass lesion/mass effect. Vascular: No hyperdense vessel or unexpected calcification. Skull: Normal. Negative for fracture or focal lesion. Sinuses/Orbits: No acute finding. Other: Soft tissue contusion about the left temporal fossa (series 3, image 10). CT CERVICAL SPINE FINDINGS Alignment: Straightening of the normal cervical lordosis. Skull base and vertebrae: No acute fracture. No primary bone lesion or focal pathologic process. Soft tissues and spinal canal: No prevertebral fluid or swelling. No visible canal hematoma. Disc  levels: Focally mild disc space height loss with anterior osteophytosis at C5-C6. Upper chest: Mild centrilobular emphysema. Other: None. IMPRESSION: 1.  No acute intracranial pathology. 2.  Soft tissue contusion about the left temporal fossa. 3.  No fracture or static subluxation of the cervical spine. 4.  Emphysema (ICD10-J43.9). Electronically Signed   By: Eddie Candle M.D.   On: 11/21/2019 16:19   CT Cervical Spine Wo Contrast  Result Date: 11/21/2019 CLINICAL DATA:  Seizure-like activity, fall EXAM: CT HEAD WITHOUT CONTRAST CT CERVICAL SPINE WITHOUT CONTRAST TECHNIQUE: Multidetector CT imaging of the head and cervical spine was performed following the standard protocol without intravenous contrast. Multiplanar CT image reconstructions of the cervical spine were also generated. COMPARISON:  08/07/2018 FINDINGS: CT HEAD FINDINGS Brain: No evidence of acute infarction, hemorrhage, hydrocephalus, extra-axial collection or mass lesion/mass effect. Vascular: No hyperdense vessel or unexpected calcification. Skull: Normal. Negative for fracture or focal lesion. Sinuses/Orbits: No acute finding. Other: Soft tissue contusion about the left temporal fossa (series 3, image 10). CT CERVICAL SPINE FINDINGS Alignment: Straightening of the normal cervical lordosis. Skull base and vertebrae: No acute fracture. No primary bone lesion or focal pathologic process. Soft tissues and spinal canal: No prevertebral fluid or swelling. No visible canal hematoma. Disc levels: Focally mild disc space height loss with anterior osteophytosis at C5-C6. Upper chest: Mild centrilobular emphysema. Other: None. IMPRESSION: 1.  No acute intracranial pathology. 2.  Soft tissue contusion about the left temporal fossa. 3.  No fracture or static subluxation of the cervical spine. 4.  Emphysema (ICD10-J43.9). Electronically Signed   By: Eddie Candle M.D.   On: 11/21/2019 16:19    Assessment/Plan Principal Problem:   Seizure ([place]) Active  Problems:   Alcohol abuse   Hypomagnesemia     #1 seizure episodes: Suspected alcohol withdrawal seizures.  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  Will initiate patient on Keppra.  Admit for treatment.  Neurology to follow.  Seizure precautions.  EEG when available.  May consider brain MRI if neurology recommends it  #2 alcohol abuse: Patient reported stopping alcohol use altogether.  At this point we will initiate CIWA protocol.  Monitor patient very closely.  Thiamine and folic acid.  #3 hypomagnesemia: Most likely secondary to alcoholism.  Replete magnesium and monitor.  #4 headaches: Probably related to fall and seizure.  He has soft tissue contusion in the left temporal area where he is having the pain.  Continue close monitoring.  Tylenol or ibuprofen for pain   DVT prophylaxis: Heparin Code Status: Full code Family Communication: No family at bedside Disposition Plan:  Home Consults called: None tonight but neurology consult in the morning Admission status: Inpatient  Severity of Illness: The appropriate patient status for this patient is INPATIENT. Inpatient status is judged to be reasonable and necessary in order to provide the required intensity of service to ensure the patient's safety. The patient's presenting symptoms, physical exam findings, and initial radiographic and laboratory data in the context of their chronic comorbidities is felt to place them at high risk for further clinical deterioration. Furthermore, it is not anticipated that the patient will be medically stable for discharge from the hospital within 2 midnights of admission. The following factors support the patient status of inpatient.   " The patient's presenting symptoms include seizures. " The worrisome physical exam findings include tremors. " The initial radiographic and laboratory data are worrisome because of no significant finding on head CT. " The chronic co-morbidities include alcohol  abuse.   * I certify that at the point of admission it is my clinical judgment that the patient will require inpatient hospital care spanning beyond 2 midnights from the point of admission due to high intensity of service, high risk for further deterioration and high frequency of surveillance required.Lonia Blood MD [facility] Pager [phone]  If 7PM-7AM, please contact night-coverage [email or web address] Password [facility]  11/21/2019, 11:03 PM

## 2019-11-21 NOTE — ED Provider Notes (Signed)
[facility] Emergency Department Provider Note  ____________________________________________   First MD Initiated Contact with Patient 11/21/19 1532     (approximate)  I have reviewed the triage vital signs and the nursing notes.   HISTORY  Chief Complaint Seizures and Withdrawal    HPI Lance Green is a 44 y.o. male with history of chronic alcohol abuse, neuropathy, chronic pancreatitis, here with seizure, nausea, vomiting. Pt reports that his sx started this morning. He had a usual amount to drink yesterday, but did have some acute on chronic n/v that night. He awoke and didn't have alcohol so has not had anything today. This afternoon, he as on the couch when he reportedly had a witnessed, GTC seizure episode. He does not recall the events, awoke with EMS there.  No tongue biting. He now c/o aching, throbbing R temporal HA but no focal numbness, weakness. No fever, chills. No cough. No other complaints. He does endorse mild tremors and anxiety since the seizure. No vision changes. He does not take any AEDs.       Past Medical History:  Diagnosis Date  . Alcohol abuse   . Neuropathy   . Pancreatitis   . Peptic ulcer disease     Patient Active Problem List   Diagnosis Date Noted  . Abscess of left middle finger 11/10/2018  . Acute appendicitis with localized peritonitis 07/06/2018    Past Surgical History:  Procedure Laterality Date  . APPENDECTOMY    . INCISION AND DRAINAGE Left 11/11/2018   Procedure: INCISION AND DRAINAGE;  Surgeon: Donato Heinz, MD;  Location: [facility] ORS;  Service: Orthopedics;  Laterality: Left;  . LAPAROSCOPIC APPENDECTOMY N/A 07/06/2018   Procedure: APPENDECTOMY LAPAROSCOPIC;  Surgeon: Carolan Shiver, MD;  Location: [facility] ORS;  Service: General;  Laterality: N/A;    Prior to Admission medications   Medication Sig Start Date End Date Taking? Authorizing Provider  predniSONE (DELTASONE) 10 MG tablet Take 6 tablets   today, on day 2 take 5 tablets, day 3 take 4 tablets, day 4 take 3 tablets, day 5 take  2 tablets and 1 tablet the last day 06/14/19   Tommi Rumps, PA-C  sulfamethoxazole-trimethoprim (BACTRIM DS) 800-160 MG tablet Take 1 tablet by mouth 2 (two) times daily. 06/14/19   Tommi Rumps, PA-C    Allergies Patient has no known allergies.  History reviewed. No pertinent family history.  Social History Social History   Tobacco Use  . Smoking status: Current Every Day Smoker    Packs/day: 0.50    Types: Cigarettes  . Smokeless tobacco: Never Used  Substance Use Topics  . Alcohol use: Yes    Comment: daily 40 oz, reports 4 40oz/day  . Drug use: No    Review of Systems  Review of Systems  Constitutional: Positive for fatigue. Negative for chills and fever.  HENT: Negative for sore throat.   Respiratory: Negative for shortness of breath.   Cardiovascular: Negative for chest pain.  Gastrointestinal: Negative for abdominal pain.  Genitourinary: Negative for flank pain.  Musculoskeletal: Negative for neck pain.  Skin: Negative for rash and wound.  Allergic/Immunologic: Negative for immunocompromised state.  Neurological: Positive for tremors and seizures. Negative for weakness and numbness.  Hematological: Does not bruise/bleed easily.  All other systems reviewed and are negative.    ____________________________________________  PHYSICAL EXAM:      VITAL SIGNS: ED Triage Vitals  Enc Vitals Group     BP 11/21/19 1557 (!) 180/116  Pulse Rate 11/21/19 1557 70     Resp 11/21/19 1557 15     Temp 11/21/19 1557 98.6 F (37 C)     Temp Source 11/21/19 1557 Oral     SpO2 11/21/19 1557 100 %     Weight 11/21/19 1558 150 lb (68 kg)     Height 11/21/19 1558 5\' 7"  (1.702 m)     Head Circumference --      Peak Flow --      Pain Score 11/21/19 1557 9     Pain Loc --      Pain Edu? --      Excl. in GC? --      Physical Exam Vitals reviewed.  Constitutional:       General: He is not in acute distress.    Appearance: He is well-developed.  HENT:     Head: Normocephalic and atraumatic.     Comments: Moderate R temporal TTP with superficial swelling, no contusions or ecchymoses. No hemotympanum. Eyes:     Conjunctiva/sclera: Conjunctivae normal.  Cardiovascular:     Rate and Rhythm: Normal rate and regular rhythm.     Heart sounds: Normal heart sounds. No murmur. No friction rub.  Pulmonary:     Effort: Pulmonary effort is normal. No respiratory distress.     Breath sounds: Normal breath sounds. No wheezing or rales.  Abdominal:     General: There is no distension.     Palpations: Abdomen is soft.     Tenderness: There is no abdominal tenderness.  Musculoskeletal:     Cervical back: Neck supple.  Skin:    General: Skin is warm.     Capillary Refill: Capillary refill takes less than 2 seconds.  Neurological:     Mental Status: He is alert and oriented to person, place, and time.     Motor: No abnormal muscle tone.     Comments: Moderate bilateral resting and intentional tremor bl UE. CN intact. Strength 5/5 b/l UE and LE. Normal sensation to light touch. Tremor noted on FTN but no overt dysmetria. Gait deferred.       ____________________________________________   LABS (all labs ordered are listed, but only abnormal results are displayed)  Labs Reviewed  CBC WITH DIFFERENTIAL/PLATELET - Abnormal; Notable for the following components:      Result Value   RBC 3.93 (*)    Hemoglobin 12.6 (*)    HCT 35.5 (*)    Platelets 115 (*)    All other components within normal limits  COMPREHENSIVE METABOLIC PANEL - Abnormal; Notable for the following components:   Glucose, Bld 150 (*)    BUN <5 (*)    Total Protein 8.2 (*)    AST 187 (*)    ALT 51 (*)    Alkaline Phosphatase 147 (*)    Anion gap 16 (*)    All other components within normal limits  MAGNESIUM - Abnormal; Notable for the following components:   Magnesium 1.2 (*)    All other  components within normal limits  SARS CORONAVIRUS 2 (TAT 6-24 HRS)  ETHANOL  URINE DRUG SCREEN, QUALITATIVE ([facility] ONLY)  LIPASE, BLOOD    ____________________________________________  EKG: Normal sinus rhythm, LVH criteria noted.  Ventricular rate 65, PR 156, QRS 96, QTc 443.  Likely benign early repolarization with no acute depressions or ischemic changes. ________________________________________  RADIOLOGY All imaging, including plain films, CT scans, and ultrasounds, independently reviewed by me, and interpretations confirmed via formal radiology reads.  ED  MD interpretation:   CT Head: No acute abnormality CT C Spine: No acute abnormality  Official radiology report(s): CT Head Wo Contrast  Result Date: 11/21/2019 CLINICAL DATA:  Seizure-like activity, fall EXAM: CT HEAD WITHOUT CONTRAST CT CERVICAL SPINE WITHOUT CONTRAST TECHNIQUE: Multidetector CT imaging of the head and cervical spine was performed following the standard protocol without intravenous contrast. Multiplanar CT image reconstructions of the cervical spine were also generated. COMPARISON:  08/07/2018 FINDINGS: CT HEAD FINDINGS Brain: No evidence of acute infarction, hemorrhage, hydrocephalus, extra-axial collection or mass lesion/mass effect. Vascular: No hyperdense vessel or unexpected calcification. Skull: Normal. Negative for fracture or focal lesion. Sinuses/Orbits: No acute finding. Other: Soft tissue contusion about the left temporal fossa (series 3, image 10). CT CERVICAL SPINE FINDINGS Alignment: Straightening of the normal cervical lordosis. Skull base and vertebrae: No acute fracture. No primary bone lesion or focal pathologic process. Soft tissues and spinal canal: No prevertebral fluid or swelling. No visible canal hematoma. Disc levels: Focally mild disc space height loss with anterior osteophytosis at C5-C6. Upper chest: Mild centrilobular emphysema. Other: None. IMPRESSION: 1.  No acute intracranial pathology. 2.   Soft tissue contusion about the left temporal fossa. 3.  No fracture or static subluxation of the cervical spine. 4.  Emphysema (ICD10-J43.9). Electronically Signed   By: Eddie Candle M.D.   On: 11/21/2019 16:19   CT Cervical Spine Wo Contrast  Result Date: 11/21/2019 CLINICAL DATA:  Seizure-like activity, fall EXAM: CT HEAD WITHOUT CONTRAST CT CERVICAL SPINE WITHOUT CONTRAST TECHNIQUE: Multidetector CT imaging of the head and cervical spine was performed following the standard protocol without intravenous contrast. Multiplanar CT image reconstructions of the cervical spine were also generated. COMPARISON:  08/07/2018 FINDINGS: CT HEAD FINDINGS Brain: No evidence of acute infarction, hemorrhage, hydrocephalus, extra-axial collection or mass lesion/mass effect. Vascular: No hyperdense vessel or unexpected calcification. Skull: Normal. Negative for fracture or focal lesion. Sinuses/Orbits: No acute finding. Other: Soft tissue contusion about the left temporal fossa (series 3, image 10). CT CERVICAL SPINE FINDINGS Alignment: Straightening of the normal cervical lordosis. Skull base and vertebrae: No acute fracture. No primary bone lesion or focal pathologic process. Soft tissues and spinal canal: No prevertebral fluid or swelling. No visible canal hematoma. Disc levels: Focally mild disc space height loss with anterior osteophytosis at C5-C6. Upper chest: Mild centrilobular emphysema. Other: None. IMPRESSION: 1.  No acute intracranial pathology. 2.  Soft tissue contusion about the left temporal fossa. 3.  No fracture or static subluxation of the cervical spine. 4.  Emphysema (ICD10-J43.9). Electronically Signed   By: Eddie Candle M.D.   On: 11/21/2019 16:19    ____________________________________________  PROCEDURES   Procedure(s) performed (including Critical Care):  Procedures  ____________________________________________  INITIAL IMPRESSION / MDM / [place] / ED COURSE  As part of my  medical decision making, I reviewed the following data within the [facility] notes reviewed and incorporated, Old chart reviewed, Notes from prior ED visits, and [place] Controlled Substance Database       *GARWOOD WENTZELL was evaluated in Emergency Department on 11/21/2019 for the symptoms described in the history of present illness. He was evaluated in the context of the global COVID-19 pandemic, which necessitated consideration that the patient might be at risk for infection with the SARS-CoV-2 virus that causes COVID-19. Institutional protocols and algorithms that pertain to the evaluation of patients at risk for COVID-19 are in a state of rapid change based on information released by regulatory  bodies including the CDC and federal and state organizations. These policies and algorithms were followed during the patient's care in the ED.  Some ED evaluations and interventions may be delayed as a result of limited staffing during the pandemic.*  Clinical Course as of Nov 21 2003  Mon Nov 21, 2019  1652 44 yo M here with seizure activity. Likely 2/2 EtOH withdrawal in setting of vomiting, poor appetite from recent alcoholic gastritis vs acute on chronic pancreatitis. Abdomen is soft, NT, and ND. No rebound or guarding. Labs show likely chronic alcoholic hepatitis, likely chronic anemia. Mag low. Will replace, treat with ativan/fluids.   [CI]  1751 Cocaine Metabolite,Ur [place]: NONE DETECTED [CI]  1837 Pt increasingly awake, alert. Symptoms of w/d are improving. Will monitor, plan to ambulate. If able to ambulate w/ no ongoing seizure activity, tx for EtOH w/d and refer for outpt follow-up.   [CI]  1859 Patient remains mildly hypertensive after Ativan, though I suspect this is more so chronic hypertension.  Given Ativan x2.  Will p.o. challenge.  CT head and C-spine negative as above   [CI]  1944 Pt remains altered. I reviewed his prior ED visits. It seems he has been seen here for w/d  when NOT withdrawing as well. Concern he may have an underlying primary seizure d/o. Given persistent post-ictal status and likely new dx of seizure d/o, will ask Neuro to see and admit.   [CI]    Clinical Course User Index [CI] Shaune Pollack, MD    Medical Decision Making:  As above.  ____________________________________________  FINAL CLINICAL IMPRESSION(S) / ED DIAGNOSES  Final diagnoses:  Alcohol withdrawal seizure without complication (HCC)  Seizures (HCC)     MEDICATIONS GIVEN DURING THIS VISIT:  Medications  acetaminophen (TYLENOL) tablet 1,000 mg (has no administration in time range)  LORazepam (ATIVAN) injection 2 mg (2 mg Intravenous Given 11/21/19 1552)  magnesium sulfate IVPB 2 g 50 mL (0 g Intravenous Stopped 11/21/19 1822)  sodium chloride 0.9 % bolus 1,000 mL (1,000 mLs Intravenous New Bag/Given 11/21/19 1740)  thiamine (B-1) injection 100 mg (100 mg Intravenous Given 11/21/19 1740)  LORazepam (ATIVAN) injection 1 mg (1 mg Intravenous Given 11/21/19 1833)  ketorolac (TORADOL) 30 MG/ML injection 15 mg (15 mg Intravenous Given 11/21/19 1833)     ED Discharge Orders    None       Note:  This document was prepared using Dragon voice recognition software and may include unintentional dictation errors.   Shaune Pollack, MD 11/21/19 2005

## 2019-11-21 NOTE — ED Notes (Signed)
Medications administered per MD order. Pt given water per his request with MD orders for PO challenge, pt denies further needs at this time.

## 2019-11-22 LAB — COMPREHENSIVE METABOLIC PANEL
ALT: 46 U/L — ABNORMAL HIGH (ref 0–44)
AST: 131 U/L — ABNORMAL HIGH (ref 15–41)
Albumin: 3.8 g/dL (ref 3.5–5.0)
Alkaline Phosphatase: 147 U/L — ABNORMAL HIGH (ref 38–126)
Anion gap: 9 (ref 5–15)
BUN: <5 mg/dL — ABNORMAL LOW (ref 6–20)
CO2: 30 mmol/L (ref 22–32)
Calcium: 8.9 mg/dL (ref 8.9–10.3)
Chloride: 96 mmol/L — ABNORMAL LOW (ref 98–111)
Creatinine, Ser: 0.7 mg/dL (ref 0.61–1.24)
GFR calc Af Amer: >60 mL/min (ref >60)
GFR calc non Af Amer: >60 mL/min (ref >60)
Glucose, Bld: 158 mg/dL — ABNORMAL HIGH (ref 70–99)
Potassium: 3 mmol/L — ABNORMAL LOW (ref 3.5–5.1)
Sodium: 135 mmol/L (ref 135–145)
Total Bilirubin: 1.1 mg/dL (ref 0.3–1.2)
Total Protein: 7.9 g/dL (ref 6.5–8.1)

## 2019-11-22 LAB — CBC
HCT: 36.2 % — ABNORMAL LOW (ref 39.0–52.0)
Hemoglobin: 12.8 g/dL — ABNORMAL LOW (ref 13.0–17.0)
MCH: 31.8 pg (ref 26.0–34.0)
MCHC: 35.4 g/dL (ref 30.0–36.0)
MCV: 90 fL (ref 80.0–100.0)
Platelets: 111 10*3/uL — ABNORMAL LOW (ref 150–400)
RBC: 4.02 MIL/uL — ABNORMAL LOW (ref 4.22–5.81)
RDW: 12.1 % (ref 11.5–15.5)
WBC: 9.4 10*3/uL (ref 4.0–10.5)
nRBC: 0 % (ref 0.0–0.2)

## 2019-11-22 LAB — HIV ANTIBODY (ROUTINE TESTING W REFLEX): HIV Screen 4th Generation wRfx: NONREACTIVE

## 2019-11-22 LAB — MAGNESIUM: Magnesium: 1.5 mg/dL — ABNORMAL LOW (ref 1.7–2.4)

## 2019-11-22 LAB — PHOSPHORUS: Phosphorus: 3.2 mg/dL (ref 2.5–4.6)

## 2019-11-22 LAB — SARS CORONAVIRUS 2 (TAT 6-24 HRS): SARS Coronavirus 2: NEGATIVE

## 2019-11-22 MED ORDER — POTASSIUM CHLORIDE 10 MEQ/100ML IV SOLN
10.0000 meq | INTRAVENOUS | Status: AC
  Administered 2019-11-22 (×6): 10 meq via INTRAVENOUS
  Filled 2019-11-22 (×6): qty 100

## 2019-11-22 MED ORDER — MAGNESIUM SULFATE 2 GM/50ML IV SOLN
2.0000 g | Freq: Once | INTRAVENOUS | Status: AC
  Administered 2019-11-22: 2 g via INTRAVENOUS
  Filled 2019-11-22: qty 50

## 2019-11-22 MED ORDER — KETOROLAC TROMETHAMINE 15 MG/ML IJ SOLN
15.0000 mg | Freq: Four times a day (QID) | INTRAMUSCULAR | Status: AC | PRN
  Administered 2019-11-22: 15 mg via INTRAVENOUS
  Filled 2019-11-22: qty 1

## 2019-11-22 NOTE — Consult Note (Signed)
Reason for Consult:Seizure Requesting Physician: Jimmye Norman  CC: Seizure and headache  I have been asked by Dr. Jimmye Norman to see this patient in consultation for alcohol withdrawal seizures.  HPI: Lance Green is an 44 y.o. male who is unable to provide any history today due to his recent sedation with Ativan therefore all history obtained from the chart.  Patient with medical history significant of chronic alcoholism, peptic ulcer disease, alcoholic pancreatitis and neuropathy who presented to the ER with multiple episodes of seizures.  Patient with last drink on 2/14.  Had some nausea and vomiting overnight and did not drink on 2/15.  Was noted that day to have a GTC seizure at home.  EMS was called and patient was brought the ED where he had two further seizures.  Reportedly no previous history of seizures.    Past Medical History:  Diagnosis Date  . Alcohol abuse   . Neuropathy   . Pancreatitis   . Peptic ulcer disease     Past Surgical History:  Procedure Laterality Date  . APPENDECTOMY    . INCISION AND DRAINAGE Left 11/11/2018   Procedure: INCISION AND DRAINAGE;  Surgeon: Dereck Leep, MD;  Location: [facility] ORS;  Service: Orthopedics;  Laterality: Left;  . LAPAROSCOPIC APPENDECTOMY N/A 07/06/2018   Procedure: APPENDECTOMY LAPAROSCOPIC;  Surgeon: Herbert Pun, MD;  Location: [facility] ORS;  Service: General;  Laterality: N/A;    Family history: Unable to provide due to mental status  Social History:  reports that he has been smoking cigarettes. He has been smoking about 0.50 packs per day. He has never used smokeless tobacco. He reports current alcohol use. He reports that he does not use drugs.  No Known Allergies  Medications:  I have reviewed the patient's current medications. Prior to Admission:  Medications Prior to Admission  Medication Sig Dispense Refill Last Dose  . predniSONE (DELTASONE) 10 MG tablet Take 6 tablets  today, on day 2 take 5 tablets, day 3 take 4  tablets, day 4 take 3 tablets, day 5 take  2 tablets and 1 tablet the last day (Patient not taking: Reported on 11/21/2019) 21 tablet 0 Completed Course at Unknown time  . sulfamethoxazole-trimethoprim (BACTRIM DS) 800-160 MG tablet Take 1 tablet by mouth 2 (two) times daily. (Patient not taking: Reported on 11/21/2019) 20 tablet 0 Completed Course at Unknown time   Scheduled: . folic acid  1 mg Oral Daily  . heparin  5,000 Units Subcutaneous Q8H  . levETIRAcetam  750 mg Oral BID  . LORazepam  0-4 mg Intravenous Q6H   Followed by  . [START ON 11/23/2019] LORazepam  0-4 mg Intravenous Q12H  . multivitamin with minerals  1 tablet Oral Daily  . thiamine  100 mg Oral Daily   Or  . thiamine  100 mg Intravenous Daily    ROS: Unable to obtain due to mental status  Physical Examination: Blood pressure (!) 147/101, pulse (!) 106, temperature 98.1 F (36.7 C), temperature source Oral, resp. rate 19, height 5\' 7"  (1.702 m), weight 55.2 kg, SpO2 98 %.  HEENT-  Normocephalic, no lesions, without obvious abnormality.  Normal external eye and conjunctiva.  Normal TM's bilaterally.  Normal auditory canals and external ears. Normal external nose, mucus membranes and septum.  Normal pharynx. Cardiovascular- S1, S2 normal, pulses palpable throughout   Lungs- chest clear, no wheezing, rales, normal symmetric air entry Abdomen- soft, non-tender; bowel sounds normal; no masses,  no organomegaly Extremities- no edema Lymph-no adenopathy palpable  Musculoskeletal-no joint tenderness, deformity or swelling Skin-warm and dry, no hyperpigmentation, vitiligo, or suspicious lesions  Neurological Examination   Mental Status: Lethargic.  Alerted with light sternal rub.  Speech unintelligible.  Follows simple commands.   Cranial Nerves: II: Blinks to bilateral confrontation.  Pupils equal, round, reactive to light and accommodation III,IV, VI: ptosis not present, extra-ocular motions intact bilaterally V,VII: Face  symmetric VIII: hearing normal bilaterally IX,X: gag reflex present XI: bilateral shoulder shrug XII: midline tongue extension Motor: Able to move all extremities against gravity weakly with no focal weakness appreciated Sensory: Responds to light stimuli bilaterally Deep Tendon Reflexes: Symmetric throughout Plantars: Right: mute   Left: mute Cerebellar: Unable to test due to mental status Gait: not tested due to safety concerns    Laboratory Studies:   Basic Metabolic Panel: Recent Labs  Lab 11/21/19 1539 11/22/19 0222  NA 139 135  K 3.7 3.0*  CL 98 96*  CO2 25 30  GLUCOSE 150* 158*  BUN <5* <5*  CREATININE 0.67 0.70  CALCIUM 9.3 8.9  MG 1.2* 1.5*  PHOS  --  3.2    Liver Function Tests: Recent Labs  Lab 11/21/19 1539 11/22/19 0222  AST 187* 131*  ALT 51* 46*  ALKPHOS 147* 147*  BILITOT 1.1 1.1  PROT 8.2* 7.9  ALBUMIN 4.2 3.8   Recent Labs  Lab 11/21/19 1638  LIPASE 15   No results for input(s): AMMONIA in the last 168 hours.  CBC: Recent Labs  Lab 11/21/19 1539 11/22/19 0222  WBC 7.1 9.4  NEUTROABS 5.4  --   HGB 12.6* 12.8*  HCT 35.5* 36.2*  MCV 90.3 90.0  PLT 115* 111*    Cardiac Enzymes: No results for input(s): CKTOTAL, CKMB, CKMBINDEX, TROPONINI in the last 168 hours.  BNP: Invalid input(s): POCBNP  CBG: No results for input(s): GLUCAP in the last 168 hours.  Microbiology: Results for orders placed or performed during the hospital encounter of 11/21/19  SARS CORONAVIRUS 2 (TAT 6-24 HRS) Nasopharyngeal Nasopharyngeal Swab     Status: None   Collection Time: 11/21/19 10:54 PM   Specimen: Nasopharyngeal Swab  Result Value Ref Range Status   SARS Coronavirus 2 NEGATIVE NEGATIVE Final    Comment: (NOTE) SARS-CoV-2 target nucleic acids are NOT DETECTED. The SARS-CoV-2 RNA is generally detectable in upper and lower respiratory specimens during the acute phase of infection. Negative results do not preclude SARS-CoV-2 infection, do  not rule out co-infections with other pathogens, and should not be used as the sole basis for treatment or other patient management decisions. Negative results must be combined with clinical observations, patient history, and epidemiological information. The expected result is Negative. Fact Sheet for Patients: HairSlick.no Fact Sheet for Healthcare Providers: quierodirigir.com This test is not yet approved or cleared by the [place] FDA and  has been authorized for detection and/or diagnosis of SARS-CoV-2 by FDA under an Emergency Use Authorization (EUA). This EUA will remain  in effect (meaning this test can be used) for the duration of the COVID-19 declaration under Section 56 4(b)(1) of the Act, 21 U.S.C. section 360bbb-3(b)(1), unless the authorization is terminated or revoked sooner. Performed at [facility], [address], [place], [place] [address]     Coagulation Studies: No results for input(s): LABPROT, INR in the last 72 hours.  Urinalysis: No results for input(s): COLORURINE, LABSPEC, PHURINE, GLUCOSEU, HGBUR, BILIRUBINUR, KETONESUR, PROTEINUR, UROBILINOGEN, NITRITE, LEUKOCYTESUR in the last 168 hours.  Invalid input(s): APPERANCEUR  Lipid Panel:  No results found for: CHOL, TRIG, HDL, CHOLHDL, VLDL, LDLCALC  HgbA1C: No results found for: HGBA1C  Urine Drug Screen:      Component Value Date/Time   LABOPIA NONE DETECTED 11/21/2019 1648   COCAINSCRNUR NONE DETECTED 11/21/2019 1648   LABBENZ NONE DETECTED 11/21/2019 1648   AMPHETMU NONE DETECTED 11/21/2019 1648   THCU NONE DETECTED 11/21/2019 1648   LABBARB NONE DETECTED 11/21/2019 1648    Alcohol Level:  Recent Labs  Lab 11/21/19 1539  ETH <10    Other results: EKG: normal sinus rhythm at 65 bpm.  Imaging: CT Head Wo Contrast  Result Date: 11/21/2019 CLINICAL DATA:  Seizure-like activity, fall EXAM: CT HEAD WITHOUT CONTRAST CT  CERVICAL SPINE WITHOUT CONTRAST TECHNIQUE: Multidetector CT imaging of the head and cervical spine was performed following the standard protocol without intravenous contrast. Multiplanar CT image reconstructions of the cervical spine were also generated. COMPARISON:  08/07/2018 FINDINGS: CT HEAD FINDINGS Brain: No evidence of acute infarction, hemorrhage, hydrocephalus, extra-axial collection or mass lesion/mass effect. Vascular: No hyperdense vessel or unexpected calcification. Skull: Normal. Negative for fracture or focal lesion. Sinuses/Orbits: No acute finding. Other: Soft tissue contusion about the left temporal fossa (series 3, image 10). CT CERVICAL SPINE FINDINGS Alignment: Straightening of the normal cervical lordosis. Skull base and vertebrae: No acute fracture. No primary bone lesion or focal pathologic process. Soft tissues and spinal canal: No prevertebral fluid or swelling. No visible canal hematoma. Disc levels: Focally mild disc space height loss with anterior osteophytosis at C5-C6. Upper chest: Mild centrilobular emphysema. Other: None. IMPRESSION: 1.  No acute intracranial pathology. 2.  Soft tissue contusion about the left temporal fossa. 3.  No fracture or static subluxation of the cervical spine. 4.  Emphysema (ICD10-J43.9). Electronically Signed   By: Lauralyn Primes M.D.   On: 11/21/2019 16:19   CT Cervical Spine Wo Contrast  Result Date: 11/21/2019 CLINICAL DATA:  Seizure-like activity, fall EXAM: CT HEAD WITHOUT CONTRAST CT CERVICAL SPINE WITHOUT CONTRAST TECHNIQUE: Multidetector CT imaging of the head and cervical spine was performed following the standard protocol without intravenous contrast. Multiplanar CT image reconstructions of the cervical spine were also generated. COMPARISON:  08/07/2018 FINDINGS: CT HEAD FINDINGS Brain: No evidence of acute infarction, hemorrhage, hydrocephalus, extra-axial collection or mass lesion/mass effect. Vascular: No hyperdense vessel or unexpected  calcification. Skull: Normal. Negative for fracture or focal lesion. Sinuses/Orbits: No acute finding. Other: Soft tissue contusion about the left temporal fossa (series 3, image 10). CT CERVICAL SPINE FINDINGS Alignment: Straightening of the normal cervical lordosis. Skull base and vertebrae: No acute fracture. No primary bone lesion or focal pathologic process. Soft tissues and spinal canal: No prevertebral fluid or swelling. No visible canal hematoma. Disc levels: Focally mild disc space height loss with anterior osteophytosis at C5-C6. Upper chest: Mild centrilobular emphysema. Other: None. IMPRESSION: 1.  No acute intracranial pathology. 2.  Soft tissue contusion about the left temporal fossa. 3.  No fracture or static subluxation of the cervical spine. 4.  Emphysema (ICD10-J43.9). Electronically Signed   By: Lauralyn Primes M.D.   On: 11/21/2019 16:19     Assessment/Plan: 44 year old male with a history of ETOH abuse, pancreatitis and PUD who presents with what appears to be ETOH withdrawal seizures.  Patient without a previous history of seizures.  Currently requiring intervention per CIWA protocol.  Head CT personally reviewed and shows no acute changes.  Patient loaded and maintained on Keppra.    Recommendations: 1. EEG 2. MRI of  the brain with and without contrast 3. Seizure precautions 4. If above work up unremarkable would start taper of Keppra 5. Patient unable to drive, operate heavy machinery, perform activities at heights and participate in water activities until release by outpatient physician. 6. Agree with continued CIWA protocol   Thana Farr, MD Neurology [phone] 11/22/2019, 10:43 AM

## 2019-11-22 NOTE — Progress Notes (Signed)
PROGRESS NOTE    Lance Green  OVF:[id number] DOB: October 10, 1975 DOA: 11/21/2019 PCP: Patient, No Pcp Per       Assessment & Plan:   Principal Problem:   Seizure ([place]) Active Problems:   Alcohol abuse   Hypomagnesemia   Seizures: likely secondary to alcohol withdrawal seizures. Continue on thiamine. Continue on keppra. Neuro consulted   Alcohol abuse: pt reported stopping alcohol use altogether.  Continue on CIWA protocol. Continue on thiamine and folic acid.  Transaminitis: secondary to alcohol abuse. Will continue to monitor  Elevated alkaline phosphatase: likely secondary to alcohol abuse. Will continue to monitor   Hypomagnesemia: mg sulfate given. Will continue to monitor  Hypokalemia: KCl repleated. Will continue to monitor  Thrombocytopenia: likely secondary to alcohol abuse. Will continue to monitor   Headaches: likely secondary to fall and seizure.  He has soft tissue contusion in the left temporal area where he is having the pain.  Tylenol prn for pain    DVT prophylaxis: heparin Code Status: full  Family Communication:  Disposition Plan:  Neuro consulted. Will likely need several days more as the pt clinically improves    Consultants:   neuro   Procedures:   Antimicrobials:    Subjective: Pt c/o left arm pain. IV had infiltrated   Objective: Vitals:   11/21/19 1743 11/21/19 2233 11/21/19 2316 11/22/19 0228  BP: (!) 193/99 (!) 160/85 (!) 165/96 (!) 151/98  Pulse: 64 82 78 95  Resp: (!) 21 20 20 20   Temp:   98.7 F (37.1 C) 98 F (36.7 C)  TempSrc:   Oral Oral  SpO2: 100% 100% 100% 99%  Weight:   55.2 kg   Height:        Intake/Output Summary (Last 24 hours) at 11/22/2019 0803 Last data filed at 11/22/2019 8416 Gross per 24 hour  Intake 700.34 ml  Output 1525 ml  Net -824.66 ml   Filed Weights   11/21/19 1558 11/21/19 2316  Weight: 68 kg 55.2 kg    Examination:  General exam: Appears calm but uncomfortable. Appears older  than stated age  Respiratory system: diminished breath sounds b/l. No rales Cardiovascular system: S1 & S2 +. No rubs, gallops or clicks Gastrointestinal system: Abdomen is nondistended, soft and nontender. Hypoactive bowel sounds heard. Central nervous system: lethargic and oriented to person. Psychiatry: Judgement and insight appear abnormal. Flat mood and affect.     Data Reviewed: I have personally reviewed following labs and imaging studies  CBC: Recent Labs  Lab 11/21/19 1539 11/22/19 0222  WBC 7.1 9.4  NEUTROABS 5.4  --   HGB 12.6* 12.8*  HCT 35.5* 36.2*  MCV 90.3 90.0  PLT 115* 606*   Basic Metabolic Panel: Recent Labs  Lab 11/21/19 1539 11/22/19 0222  NA 139 135  K 3.7 3.0*  CL 98 96*  CO2 25 30  GLUCOSE 150* 158*  BUN <5* <5*  CREATININE 0.67 0.70  CALCIUM 9.3 8.9  MG 1.2* 1.5*  PHOS  --  3.2   GFR: Estimated Creatinine Clearance: 91 mL/min (by C-G formula based on SCr of 0.7 mg/dL). Liver Function Tests: Recent Labs  Lab 11/21/19 1539 11/22/19 0222  AST 187* 131*  ALT 51* 46*  ALKPHOS 147* 147*  BILITOT 1.1 1.1  PROT 8.2* 7.9  ALBUMIN 4.2 3.8   Recent Labs  Lab 11/21/19 1638  LIPASE 15   No results for input(s): AMMONIA in the last 168 hours. Coagulation Profile: No results for input(s): INR, PROTIME in  the last 168 hours. Cardiac Enzymes: No results for input(s): CKTOTAL, CKMB, CKMBINDEX, TROPONINI in the last 168 hours. BNP (last 3 results) No results for input(s): PROBNP in the last 8760 hours. HbA1C: No results for input(s): HGBA1C in the last 72 hours. CBG: No results for input(s): GLUCAP in the last 168 hours. Lipid Profile: No results for input(s): CHOL, HDL, LDLCALC, TRIG, CHOLHDL, LDLDIRECT in the last 72 hours. Thyroid Function Tests: No results for input(s): TSH, T4TOTAL, FREET4, T3FREE, THYROIDAB in the last 72 hours. Anemia Panel: No results for input(s): VITAMINB12, FOLATE, FERRITIN, TIBC, IRON, RETICCTPCT in the last  72 hours. Sepsis Labs: No results for input(s): PROCALCITON, LATICACIDVEN in the last 168 hours.  No results found for this or any previous visit (from the past 240 hour(s)).       Radiology Studies: CT Head Wo Contrast  Result Date: 11/21/2019 CLINICAL DATA:  Seizure-like activity, fall EXAM: CT HEAD WITHOUT CONTRAST CT CERVICAL SPINE WITHOUT CONTRAST TECHNIQUE: Multidetector CT imaging of the head and cervical spine was performed following the standard protocol without intravenous contrast. Multiplanar CT image reconstructions of the cervical spine were also generated. COMPARISON:  08/07/2018 FINDINGS: CT HEAD FINDINGS Brain: No evidence of acute infarction, hemorrhage, hydrocephalus, extra-axial collection or mass lesion/mass effect. Vascular: No hyperdense vessel or unexpected calcification. Skull: Normal. Negative for fracture or focal lesion. Sinuses/Orbits: No acute finding. Other: Soft tissue contusion about the left temporal fossa (series 3, image 10). CT CERVICAL SPINE FINDINGS Alignment: Straightening of the normal cervical lordosis. Skull base and vertebrae: No acute fracture. No primary bone lesion or focal pathologic process. Soft tissues and spinal canal: No prevertebral fluid or swelling. No visible canal hematoma. Disc levels: Focally mild disc space height loss with anterior osteophytosis at C5-C6. Upper chest: Mild centrilobular emphysema. Other: None. IMPRESSION: 1.  No acute intracranial pathology. 2.  Soft tissue contusion about the left temporal fossa. 3.  No fracture or static subluxation of the cervical spine. 4.  Emphysema (ICD10-J43.9). Electronically Signed   By: Lauralyn Primes M.D.   On: 11/21/2019 16:19   CT Cervical Spine Wo Contrast  Result Date: 11/21/2019 CLINICAL DATA:  Seizure-like activity, fall EXAM: CT HEAD WITHOUT CONTRAST CT CERVICAL SPINE WITHOUT CONTRAST TECHNIQUE: Multidetector CT imaging of the head and cervical spine was performed following the standard  protocol without intravenous contrast. Multiplanar CT image reconstructions of the cervical spine were also generated. COMPARISON:  08/07/2018 FINDINGS: CT HEAD FINDINGS Brain: No evidence of acute infarction, hemorrhage, hydrocephalus, extra-axial collection or mass lesion/mass effect. Vascular: No hyperdense vessel or unexpected calcification. Skull: Normal. Negative for fracture or focal lesion. Sinuses/Orbits: No acute finding. Other: Soft tissue contusion about the left temporal fossa (series 3, image 10). CT CERVICAL SPINE FINDINGS Alignment: Straightening of the normal cervical lordosis. Skull base and vertebrae: No acute fracture. No primary bone lesion or focal pathologic process. Soft tissues and spinal canal: No prevertebral fluid or swelling. No visible canal hematoma. Disc levels: Focally mild disc space height loss with anterior osteophytosis at C5-C6. Upper chest: Mild centrilobular emphysema. Other: None. IMPRESSION: 1.  No acute intracranial pathology. 2.  Soft tissue contusion about the left temporal fossa. 3.  No fracture or static subluxation of the cervical spine. 4.  Emphysema (ICD10-J43.9). Electronically Signed   By: Lauralyn Primes M.D.   On: 11/21/2019 16:19        Scheduled Meds: . folic acid  1 mg Oral Daily  . heparin  5,000 Units Subcutaneous Q8H  .  levETIRAcetam  750 mg Oral BID  . LORazepam  0-4 mg Intravenous Q6H   Followed by  . [START ON 11/23/2019] LORazepam  0-4 mg Intravenous Q12H  . multivitamin with minerals  1 tablet Oral Daily  . thiamine  100 mg Oral Daily   Or  . thiamine  100 mg Intravenous Daily   Continuous Infusions: . dextrose 5 % and 0.9% NaCl 125 mL/hr at 11/21/19 2345  . potassium chloride 10 mEq (11/22/19 0723)     LOS: 1 day    Time spent: 30 mins    Charise Killian, MD [facility] Pager [phone]  If 7PM-7AM, please contact night-coverage [email or web address] 11/22/2019, 8:03 AM

## 2019-11-22 NOTE — Progress Notes (Addendum)
Patient admitted to unit at 2318, NAD noted. Diaphoretic, but no tremors or tachycardia noted. VSS aside from hypertension.   2mg  Ativan administered at 2345.   Patient resting quietly, will continue to monitor closely.

## 2019-11-22 NOTE — Plan of Care (Signed)
  Problem: Education: Goal: Knowledge of General Education information will improve Description Including pain rating scale, medication(s)/side effects and non-pharmacologic comfort measures Outcome: Progressing   Problem: Health Behavior/Discharge Planning: Goal: Ability to manage health-related needs will improve Outcome: Progressing   

## 2019-11-22 NOTE — Procedures (Signed)
ELECTROENCEPHALOGRAM REPORT   Patient: Lance Green       Room #: 239A-AA  Age: 44 y.o.        Sex: male Requesting Physician: Mayford Knife Report Date:  11/22/2019        Interpreting Physician: Thana Farr  History: Lance Green is an 44 y.o. male with seizure activity s/p ETOH discontinuation  Medications:  Ativan, Folic acid, Keppra, Thiamine, MVI  Conditions of Recording:  This is a 21 channel routine scalp EEG performed with bipolar and monopolar montages arranged in accordance to the international 10/20 system of electrode placement. One channel was dedicated to EKG recording.  The patient is in the awake, drowsy and asleep states.  Description:  The patient is asleep for most of the recording.  Briefly wakefulness can be evaluated and during these periods the waking background activity consists of a low voltage, symmetrical, fairly well organized, 8.5 Hz alpha activity, seen from the parieto-occipital and posterior temporal regions.  Low voltage fast activity, poorly organized, is seen anteriorly and is at times superimposed on more posterior regions.  A mixture of theta and alpha rhythms are seen from the central and temporal regions. The patient drowses with slowing to irregular, low voltage theta and beta activity.   The patient goes in to a light sleep with symmetrical sleep spindles, vertex central sharp transients and irregular slow activity.  No epileptiform activity is noted.   Hyperventilation was not performed.  Intermittent photic stimulation was performed but failed to illicit any change in the tracing.     IMPRESSION: Normal electroencephalogram, awake, asleep and with activation procedures. There are no focal lateralizing or epileptiform features.   Thana Farr, MD Neurology [phone] 11/22/2019, 1:01 PM

## 2019-11-22 NOTE — Plan of Care (Signed)

## 2019-11-22 NOTE — Progress Notes (Signed)
eeg completed ° °

## 2019-11-23 ENCOUNTER — Inpatient Hospital Stay: Payer: Self-pay

## 2019-11-23 LAB — COMPREHENSIVE METABOLIC PANEL
ALT: 34 U/L (ref 0–44)
AST: 91 U/L — ABNORMAL HIGH (ref 15–41)
Albumin: 3.4 g/dL — ABNORMAL LOW (ref 3.5–5.0)
Alkaline Phosphatase: 125 U/L (ref 38–126)
Anion gap: 11 (ref 5–15)
BUN: <5 mg/dL — ABNORMAL LOW (ref 6–20)
CO2: 23 mmol/L (ref 22–32)
Calcium: 8.4 mg/dL — ABNORMAL LOW (ref 8.9–10.3)
Chloride: 103 mmol/L (ref 98–111)
Creatinine, Ser: 0.68 mg/dL (ref 0.61–1.24)
GFR calc Af Amer: >60 mL/min (ref >60)
GFR calc non Af Amer: >60 mL/min (ref >60)
Glucose, Bld: 131 mg/dL — ABNORMAL HIGH (ref 70–99)
Potassium: 3.5 mmol/L (ref 3.5–5.1)
Sodium: 137 mmol/L (ref 135–145)
Total Bilirubin: 0.9 mg/dL (ref 0.3–1.2)
Total Protein: 7.1 g/dL (ref 6.5–8.1)

## 2019-11-23 LAB — CBC
HCT: 36.1 % — ABNORMAL LOW (ref 39.0–52.0)
Hemoglobin: 12.4 g/dL — ABNORMAL LOW (ref 13.0–17.0)
MCH: 31.7 pg (ref 26.0–34.0)
MCHC: 34.3 g/dL (ref 30.0–36.0)
MCV: 92.3 fL (ref 80.0–100.0)
Platelets: 106 10*3/uL — ABNORMAL LOW (ref 150–400)
RBC: 3.91 MIL/uL — ABNORMAL LOW (ref 4.22–5.81)
RDW: 12.4 % (ref 11.5–15.5)
WBC: 6.8 10*3/uL (ref 4.0–10.5)
nRBC: 0 % (ref 0.0–0.2)

## 2019-11-23 LAB — MAGNESIUM: Magnesium: 1.7 mg/dL (ref 1.7–2.4)

## 2019-11-23 LAB — HEPATIC FUNCTION PANEL
ALT: 34 U/L (ref 0–44)
AST: 99 U/L — ABNORMAL HIGH (ref 15–41)
Albumin: 3.4 g/dL — ABNORMAL LOW (ref 3.5–5.0)
Alkaline Phosphatase: 122 U/L (ref 38–126)
Bilirubin, Direct: 0.2 mg/dL (ref 0.0–0.2)
Indirect Bilirubin: 0.7 mg/dL (ref 0.3–0.9)
Total Bilirubin: 0.9 mg/dL (ref 0.3–1.2)
Total Protein: 7.3 g/dL (ref 6.5–8.1)

## 2019-11-23 LAB — PHOSPHORUS: Phosphorus: 2.7 mg/dL (ref 2.5–4.6)

## 2019-11-23 IMAGING — MR MR HEAD W/O CM
10 series · 48 of 48 positions shown · non-contrast
Comparison: Head CT [DATE].

CLINICAL DATA: 43-year-old female with recent seizure like activity
and fall. Possible ETOH withdrawal seizures.

EXAM:
MRI HEAD WITHOUT CONTRAST
TECHNIQUE: Multiplanar, multiecho pulse sequences of the brain and surrounding
structures were obtained without intravenous contrast.

[Series 5: ax dwi_tracew · axial · 3.0mm · 1.31mm/px · z∈[-85,+66]mm · 8 of 48 slices shown]
[im 1/48]
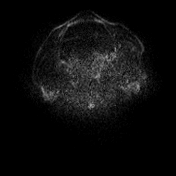
[im 7/48]
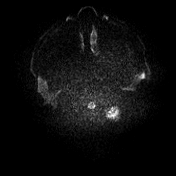
[im 14/48]
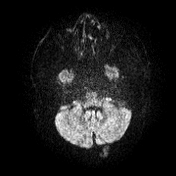
[im 21/48]
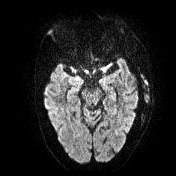
[im 27/48]
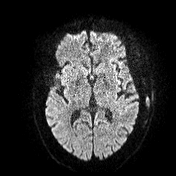
[im 34/48]
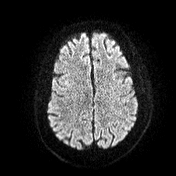
[im 41/48]
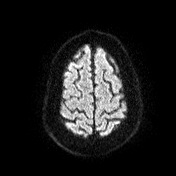
[im 48/48]
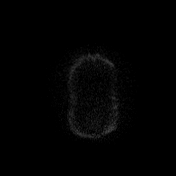

[Series 6: ax dwi_adc · axial · 3.0mm · 1.31mm/px · z∈[-85,+66]mm · 7 of 47 slices shown]
[im 1/47]
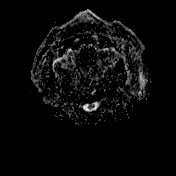
[im 8/47]
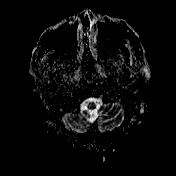
[im 16/47]
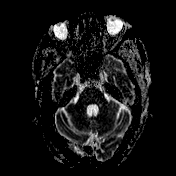
[im 24/47]
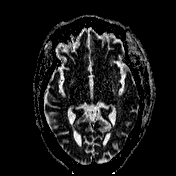
[im 31/47]
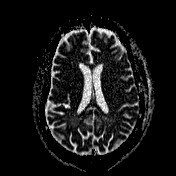
[im 39/47]
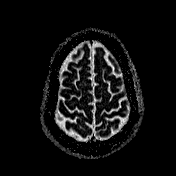
[im 47/47]
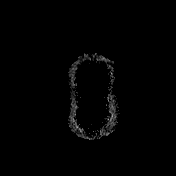

[Series 7: cor dwi_tracew · coronal · 5.0mm · 1.31mm/px · 5 of 38 slices shown]
[im 1/38]
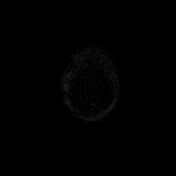
[im 10/38]
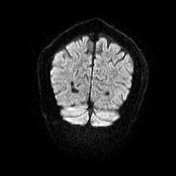
[im 19/38]
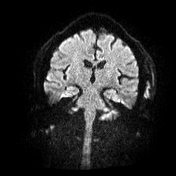
[im 28/38]
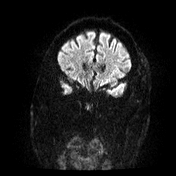
[im 38/38]
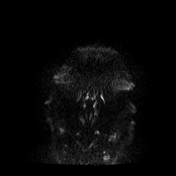

[Series 8: cor dwi_adc · coronal · 5.0mm · 1.31mm/px · 5 of 38 slices shown]
[im 1/38]
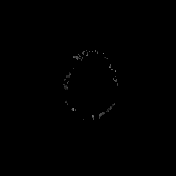
[im 10/38]
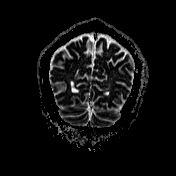
[im 19/38]
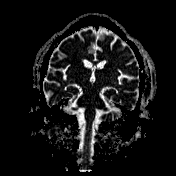
[im 28/38]
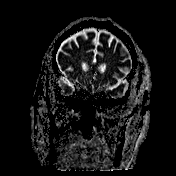
[im 38/38]
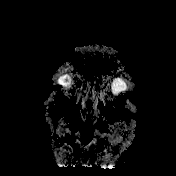

[Series 9: FLAIR · axial · 5.0mm · 1.20mm/px · z∈[-85,+67]mm · 4 of 27 slices shown]
[im 1/27]
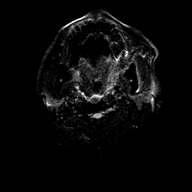
[im 9/27]
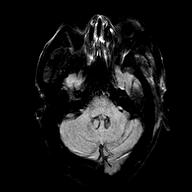
[im 18/27]
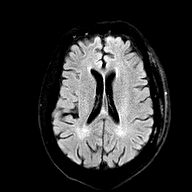
[im 27/27]
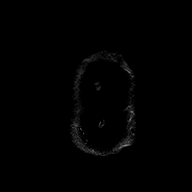

[Series 10: T1 · sagittal · 5.0mm · 0.94mm/px · 3 of 23 slices shown (1 of 2)]
[im 1/23]
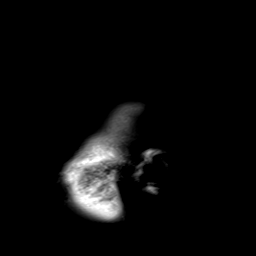
[im 12/23]
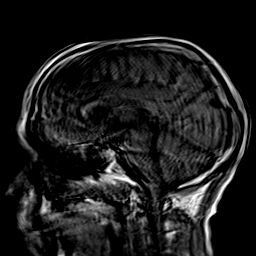
[im 23/23]
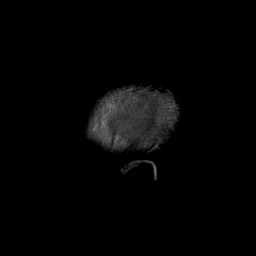

[Series 11: T2 · axial · 5.0mm · 0.45mm/px · z∈[-84,+68]mm · 4 of 27 slices shown (1 of 2)]
[im 1/27]
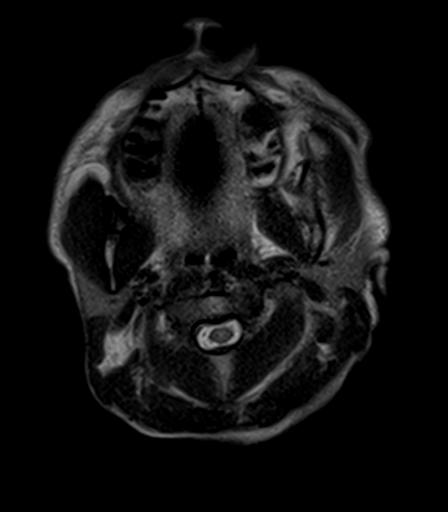
[im 9/27]
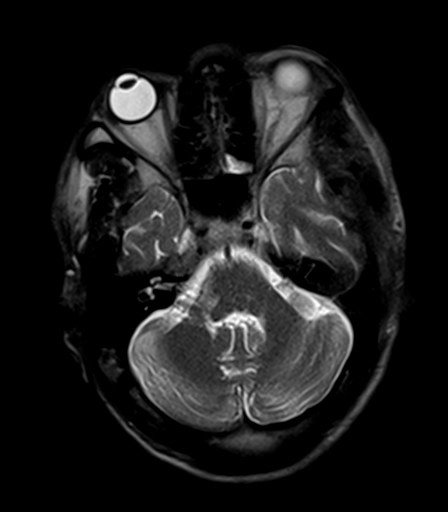
[im 18/27]
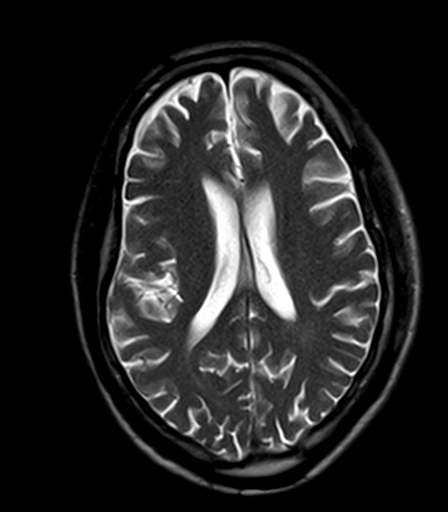
[im 27/27]
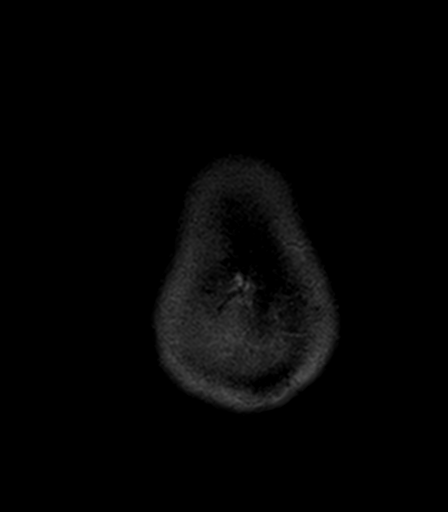

[Series 12: T2-star · axial · 5.0mm · 0.45mm/px · z∈[-84,+68]mm · 4 of 27 slices shown]
[im 1/27]
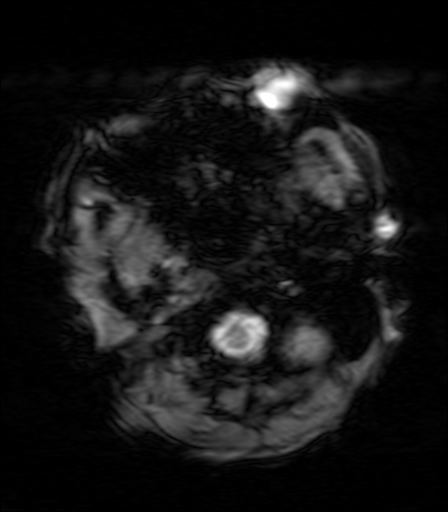
[im 9/27]
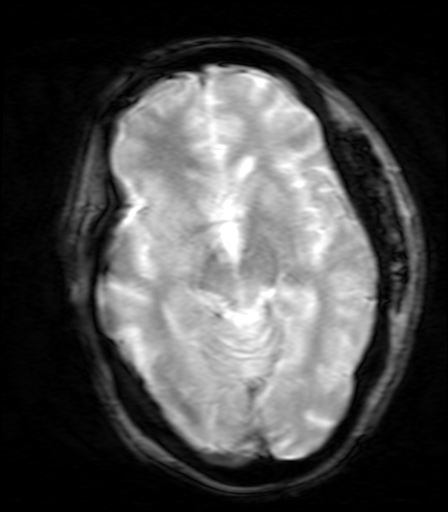
[im 18/27]
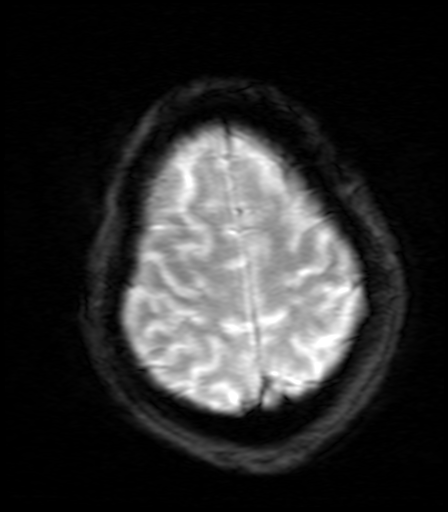
[im 27/27]
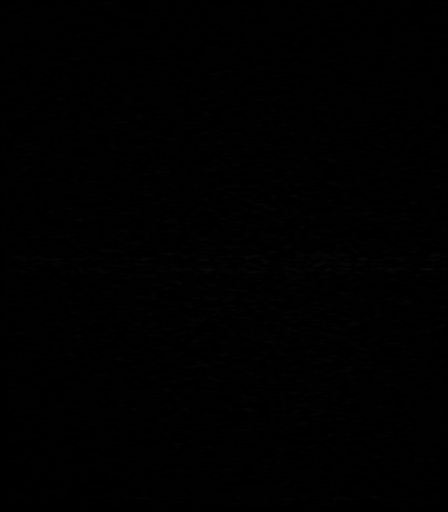

[Series 13: T1 · axial · 5.0mm · 0.90mm/px · z∈[-84,+50]mm · 3 of 24 slices shown (2 of 2)]
[im 1/24]
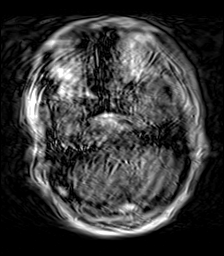
[im 12/24]
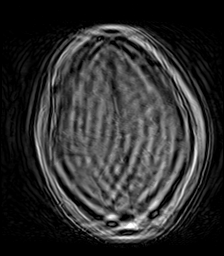
[im 24/24]
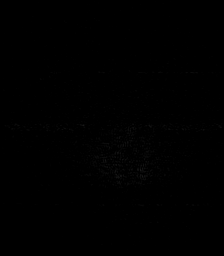

[Series 14: T2 · coronal · 3.0mm · 0.47mm/px · 5 of 35 slices shown (2 of 2)]
[im 1/35]
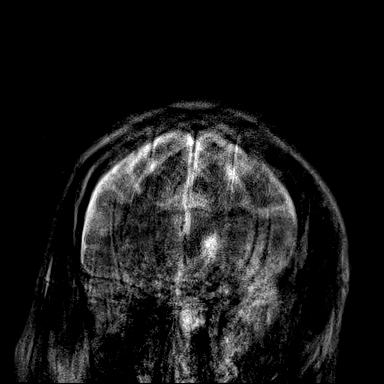
[im 9/35]
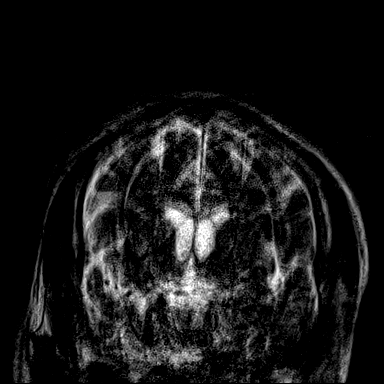
[im 18/35]
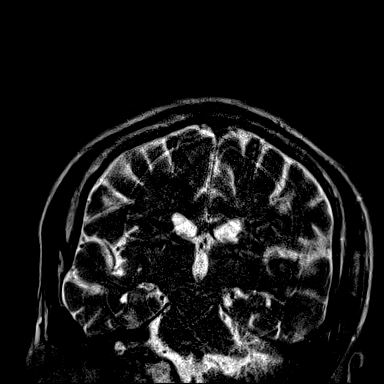
[im 26/35]
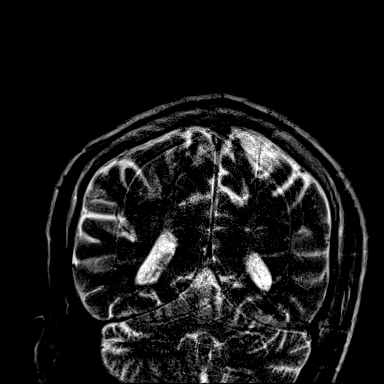
[im 35/35]
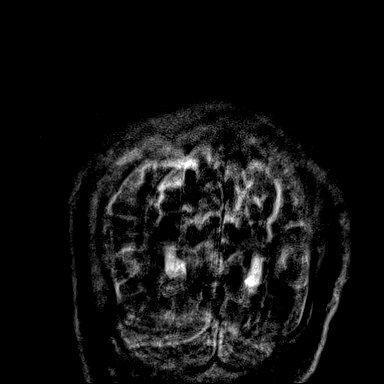

[48 of 48 positions shown; findings below may reference images not displayed]

FINDINGS: Study is intermittently degraded by motion artifact despite repeated
imaging attempts, and the examination had to be discontinued prior
to completion of thin coronal images due to patient agitation.

Brain: No restricted diffusion to suggest acute infarction. No
midline shift, mass effect, evidence of mass lesion,
ventriculomegaly, extra-axial collection or acute intracranial
hemorrhage. Cervicomedullary junction and pituitary are within
normal limits.

Gray and white matter signal is within normal limits for age.
High-resolution coronal images of the temporal lobes are degraded.
No encephalomalacia or chronic cerebral blood products identified.

Vascular: Major intracranial vascular flow voids are preserved.

Skull and upper cervical spine: Visualized bone marrow signal is
within normal limits. Grossly normal visible upper cervical spine.

Sinuses/Orbits: Negative orbits. Isolated posterior left ethmoid air
cell mucosal thickening is stable. Remaining sinuses and mastoids
are well pneumatized.

Other: Grossly normal visible internal auditory structures. Left
lateral scalp hematoma again noted, most conspicuous on series 7,
image 21.
IMPRESSION: 1. Motion degraded exam with no acute intracranial abnormality,
negative noncontrast MRI appearance of the brain.
2. Left lateral scalp hematoma.

## 2019-11-23 MED ORDER — GADOBUTROL 1 MMOL/ML IV SOLN: 5.0000 mL | Freq: Once | INTRAVENOUS | Status: DC | PRN

## 2019-11-23 NOTE — Progress Notes (Signed)
PROGRESS NOTE    FRANCISCA HARBUCK  OZH:[id number] DOB: [birth year] DOA: 11/21/2019 PCP: Patient, No Pcp Per    Brief Narrative:  BERISH BOHMAN is a 44 y.o. male with medical history significant of chronic alcoholism, peptic ulcer disease, alcoholic pancreatitis and neuropathy who presented to the ER with multiple episodes of seizures.  Patient was observed to have seizure in the ER twice    Consultants:   Neurology  Procedures: EEG  Antimicrobials:   None   Subjective: Patient was scoring 14 and was given Ativan and by the time I saw the patient patient is very sleepy.  He opens his eyes but he is very groggy and sleepy and closes it.  Per nursing he has been scoring high last night and today.  Objective: Vitals:   11/23/19 0702 11/23/19 0827 11/23/19 1134 11/23/19 1651  BP: (!) 140/94 (!) 157/103 (!) 138/105 (!) 155/95  Pulse: 94 76 80 77  Resp:  18 18 18   Temp:  98.5 F (36.9 C) 98.6 F (37 C) 98.6 F (37 C)  TempSrc:  Oral    SpO2:  100% 100% 100%  Weight:      Height:        Intake/Output Summary (Last 24 hours) at 11/23/2019 1727 Last data filed at 11/23/2019 1721 Gross per 24 hour  Intake 1845.36 ml  Output 2500 ml  Net -654.64 ml   Filed Weights   11/21/19 1558 11/21/19 2316  Weight: 68 kg 55.2 kg    Examination:  General exam: Appears calm sleepy Respiratory system: Clear to auscultation.  With poor respiratory effort . Cardiovascular system: S1 & S2 heard, RRR. No JVD, murmurs, rubs, gallops or clicks. Gastrointestinal system: Abdomen is nondistended, soft and nontender. No organomegaly or masses felt. Normal bowel sounds heard. Central nervous system: Unable to assess since patient is very sleepy and falls asleep on exam Extremities no edema Skin: Warm and dry     Data Reviewed: I have personally reviewed following labs and imaging studies  CBC: Recent Labs  Lab 11/21/19 1539 11/22/19 0222 11/23/19 0517  WBC 7.1 9.4 6.8  NEUTROABS  5.4  --   --   HGB 12.6* 12.8* 12.4*  HCT 35.5* 36.2* 36.1*  MCV 90.3 90.0 92.3  PLT 115* 111* 629*   Basic Metabolic Panel: Recent Labs  Lab 11/21/19 1539 11/22/19 0222 11/23/19 0517  NA 139 135 137  K 3.7 3.0* 3.5  CL 98 96* 103  CO2 25 30 23   GLUCOSE 150* 158* 131*  BUN <5* <5* <5*  CREATININE 0.67 0.70 0.68  CALCIUM 9.3 8.9 8.4*  MG 1.2* 1.5* 1.7  PHOS  --  3.2 2.7   GFR: Estimated Creatinine Clearance: 91 mL/min (by C-G formula based on SCr of 0.68 mg/dL). Liver Function Tests: Recent Labs  Lab 11/21/19 1539 11/22/19 0222 11/23/19 0517  AST 187* 131* 91*  ALT 51* 46* 34  ALKPHOS 147* 147* 125  BILITOT 1.1 1.1 0.9  PROT 8.2* 7.9 7.1  ALBUMIN 4.2 3.8 3.4*   Recent Labs  Lab 11/21/19 1638  LIPASE 15   No results for input(s): AMMONIA in the last 168 hours. Coagulation Profile: No results for input(s): INR, PROTIME in the last 168 hours. Cardiac Enzymes: No results for input(s): CKTOTAL, CKMB, CKMBINDEX, TROPONINI in the last 168 hours. BNP (last 3 results) No results for input(s): PROBNP in the last 8760 hours. HbA1C: No results for input(s): HGBA1C in the last 72 hours. CBG: No results for  input(s): GLUCAP in the last 168 hours. Lipid Profile: No results for input(s): CHOL, HDL, LDLCALC, TRIG, CHOLHDL, LDLDIRECT in the last 72 hours. Thyroid Function Tests: No results for input(s): TSH, T4TOTAL, FREET4, T3FREE, THYROIDAB in the last 72 hours. Anemia Panel: No results for input(s): VITAMINB12, FOLATE, FERRITIN, TIBC, IRON, RETICCTPCT in the last 72 hours. Sepsis Labs: No results for input(s): PROCALCITON, LATICACIDVEN in the last 168 hours.  Recent Results (from the past 240 hour(s))  SARS CORONAVIRUS 2 (TAT 6-24 HRS) Nasopharyngeal Nasopharyngeal Swab     Status: None   Collection Time: 11/21/19 10:54 PM   Specimen: Nasopharyngeal Swab  Result Value Ref Range Status   SARS Coronavirus 2 NEGATIVE NEGATIVE Final    Comment: (NOTE) SARS-CoV-2  target nucleic acids are NOT DETECTED. The SARS-CoV-2 RNA is generally detectable in upper and lower respiratory specimens during the acute phase of infection. Negative results do not preclude SARS-CoV-2 infection, do not rule out co-infections with other pathogens, and should not be used as the sole basis for treatment or other patient management decisions. Negative results must be combined with clinical observations, patient history, and epidemiological information. The expected result is Negative. Fact Sheet for Patients: HairSlick.no Fact Sheet for Healthcare Providers: quierodirigir.com This test is not yet approved or cleared by the [place] FDA and  has been authorized for detection and/or diagnosis of SARS-CoV-2 by FDA under an Emergency Use Authorization (EUA). This EUA will remain  in effect (meaning this test can be used) for the duration of the COVID-19 declaration under Section 56 4(b)(1) of the Act, 21 U.S.C. section 360bbb-3(b)(1), unless the authorization is terminated or revoked sooner. Performed at [facility], [address] [place], [place] [address]          Radiology Studies: EEG  Result Date: 11/22/2019 Thana Farr, MD     11/22/2019  1:04 PM ELECTROENCEPHALOGRAM REPORT Patient: LESHON ARMISTEAD       Room #: 239A-AA Age: 44 y.o.        Sex: male Requesting Physician: Mayford Knife Report Date:  11/22/2019       Interpreting Physician: Thana Farr History: JC VERON is an 44 y.o. male with seizure activity s/p ETOH discontinuation Medications: Ativan, Folic acid, Keppra, Thiamine, MVI Conditions of Recording:  This is a 21 channel routine scalp EEG performed with bipolar and monopolar montages arranged in accordance to the international 10/20 system of electrode placement. One channel was dedicated to EKG recording. The patient is in the awake, drowsy and asleep states. Description:  The  patient is asleep for most of the recording.  Briefly wakefulness can be evaluated and during these periods the waking background activity consists of a low voltage, symmetrical, fairly well organized, 8.5 Hz alpha activity, seen from the parieto-occipital and posterior temporal regions.  Low voltage fast activity, poorly organized, is seen anteriorly and is at times superimposed on more posterior regions.  A mixture of theta and alpha rhythms are seen from the central and temporal regions. The patient drowses with slowing to irregular, low voltage theta and beta activity.  The patient goes in to a light sleep with symmetrical sleep spindles, vertex central sharp transients and irregular slow activity. No epileptiform activity is noted.  Hyperventilation was not performed.  Intermittent photic stimulation was performed but failed to illicit any change in the tracing.  IMPRESSION: Normal electroencephalogram, awake, asleep and with activation procedures. There are no focal lateralizing or epileptiform features. Thana Farr, MD Neurology [phone] 11/22/2019,  1:01 PM   MR BRAIN WO CONTRAST  Result Date: 11/23/2019 CLINICAL DATA:  44 year old male with recent seizure like activity and fall. Possible ETOH withdrawal seizures. EXAM: MRI HEAD WITHOUT CONTRAST TECHNIQUE: Multiplanar, multiecho pulse sequences of the brain and surrounding structures were obtained without intravenous contrast. COMPARISON:  Head CT 11/21/2019. FINDINGS: Study is intermittently degraded by motion artifact despite repeated imaging attempts, and the examination had to be discontinued prior to completion of thin coronal images due to patient agitation. Brain: No restricted diffusion to suggest acute infarction. No midline shift, mass effect, evidence of mass lesion, ventriculomegaly, extra-axial collection or acute intracranial hemorrhage. Cervicomedullary junction and pituitary are within normal limits. Wallace Cullens and white matter signal  is within normal limits for age. High-resolution coronal images of the temporal lobes are degraded. No encephalomalacia or chronic cerebral blood products identified. Vascular: Major intracranial vascular flow voids are preserved. Skull and upper cervical spine: Visualized bone marrow signal is within normal limits. Grossly normal visible upper cervical spine. Sinuses/Orbits: Negative orbits. Isolated posterior left ethmoid air cell mucosal thickening is stable. Remaining sinuses and mastoids are well pneumatized. Other: Grossly normal visible internal auditory structures. Left lateral scalp hematoma again noted, most conspicuous on series 7, image 21. IMPRESSION: 1. Motion degraded exam with no acute intracranial abnormality, negative noncontrast MRI appearance of the brain. 2. Left lateral scalp hematoma. Electronically Signed   By: Odessa Fleming M.D.   On: 11/23/2019 15:54        Scheduled Meds: . folic acid  1 mg Oral Daily  . heparin  5,000 Units Subcutaneous Q8H  . levETIRAcetam  750 mg Oral BID  . LORazepam  0-4 mg Intravenous Q6H   Followed by  . LORazepam  0-4 mg Intravenous Q12H  . multivitamin with minerals  1 tablet Oral Daily  . thiamine  100 mg Oral Daily   Or  . thiamine  100 mg Intravenous Daily   Continuous Infusions: . dextrose 5 % and 0.9% NaCl 125 mL/hr at 11/23/19 0458    Assessment & Plan:   Principal Problem:   Seizure [facility]) Active Problems:   Alcohol abuse   Hypomagnesemia   Seizures:likely secondary to alcohol withdrawal seizures. Continue on thiamine.  CT without acute changes.  Patient loaded and maintained on Keppra.   Neurology following.   MRI of brain pending.   EEG without any epileptiform activity.   Per neurology if MRI of the brain is unremarkable would start taper of Keppra Seizure precaution Patient unable to drive, operate heavy machinery, perform activities at heights and participate in water activities until release by outpatient  physician.   Alcohol abuse: pt reported stopping alcohol use altogether. Continue on CIWA protocol as he is scoring high.  Continue on thiamine and folic acid.  Transaminitis: secondary to alcohol abuse. Will continue to monitor  Elevated alkaline phosphatase: likely secondary to alcohol abuse. Will continue to monitor   Hypomagnesemia:Repleted, will give another dose to keep magnesium around 2  Continue to monitor  Hypokalemia: Repleted now stable continue to monitor  Thrombocytopenia: likely secondary to alcohol abuse.  But has decreased mildly.  Will DC heparin subcu for now Monitor level  Headaches:likely secondary to fall and seizure. He has soft tissue contusion in the left temporal area where he is having the pain. Tylenol prn for pain     DVT prophylaxis: scd Code Status: Full Family Communication: None at bedside Disposition Plan: Only scoring on alcohol withdrawal protocol.  We will cleared from neurology standpoint.  LOS: 2 days   Time spent: 45 minutes with more than 50% on COC    Lynn Ito, MD [facility] Pager [phone]  If 7PM-7AM, please contact night-coverage [email or web address] Password [facility] 11/23/2019, 5:27 PM

## 2019-11-23 NOTE — Progress Notes (Signed)
Subjective: Patient more alert today.  No further seizures noted.    Objective: Current vital signs: BP (!) 138/105 (BP Location: Right Arm)   Pulse 80   Temp 98.6 F (37 C)   Resp 18   Ht 5\' 7"  (1.702 m)   Wt 55.2 kg   SpO2 100%   BMI 19.08 kg/m  Vital signs in last 24 hours: Temp:  [98 F (36.7 C)-98.9 F (37.2 C)] 98.6 F (37 C) (02/17 1134) Pulse Rate:  [72-95] 80 (02/17 1134) Resp:  [16-19] 18 (02/17 1134) BP: (138-174)/(92-105) 138/105 (02/17 1134) SpO2:  [99 %-100 %] 100 % (02/17 1134)  Intake/Output from previous day: 02/16 0701 - 02/17 0700 In: 1845.4 [I.V.:1845.4] Out: 2650 [Urine:2650] Intake/Output this shift: No intake/output data recorded. Nutritional status:  Diet Order            Diet regular Room service appropriate? Yes; Fluid consistency: Thin  Diet effective now              Neurologic Exam: Mental Status: Alert and awake.  Feeding himself.  Speech slurred but understandable and appropriate.  Follows simple commands.  Fluent.   Cranial Nerves: II: Blinks to bilateral confrontation, pupils equal, round, reactive to light and accommodation III,IV, VI: ptosis not present, extra-ocular motions intact bilaterally V,VII: smile symmetric, facial light touch sensation normal bilaterally VIII: hearing normal bilaterally IX,X: gag reflex present XI: bilateral shoulder shrug XII: midline tongue extension Motor: Lifts all extremities weakly against gravity Sensory: Pinprick and light touch intact throughout, bilaterally  Lab Results: Basic Metabolic Panel: Recent Labs  Lab 11/21/19 1539 11/22/19 0222 11/23/19 0517  NA 139 135 137  K 3.7 3.0* 3.5  CL 98 96* 103  CO2 25 30 23   GLUCOSE 150* 158* 131*  BUN <5* <5* <5*  CREATININE 0.67 0.70 0.68  CALCIUM 9.3 8.9 8.4*  MG 1.2* 1.5* 1.7  PHOS  --  3.2 2.7    Liver Function Tests: Recent Labs  Lab 11/21/19 1539 11/22/19 0222 11/23/19 0517  AST 187* 131* 91*  ALT 51* 46* 34  ALKPHOS  147* 147* 125  BILITOT 1.1 1.1 0.9  PROT 8.2* 7.9 7.1  ALBUMIN 4.2 3.8 3.4*   Recent Labs  Lab 11/21/19 1638  LIPASE 15   No results for input(s): AMMONIA in the last 168 hours.  CBC: Recent Labs  Lab 11/21/19 1539 11/22/19 0222 11/23/19 0517  WBC 7.1 9.4 6.8  NEUTROABS 5.4  --   --   HGB 12.6* 12.8* 12.4*  HCT 35.5* 36.2* 36.1*  MCV 90.3 90.0 92.3  PLT 115* 111* 106*    Cardiac Enzymes: No results for input(s): CKTOTAL, CKMB, CKMBINDEX, TROPONINI in the last 168 hours.  Lipid Panel: No results for input(s): CHOL, TRIG, HDL, CHOLHDL, VLDL, LDLCALC in the last 168 hours.  CBG: No results for input(s): GLUCAP in the last 168 hours.  Microbiology: Results for orders placed or performed during the hospital encounter of 11/21/19  SARS CORONAVIRUS 2 (TAT 6-24 HRS) Nasopharyngeal Nasopharyngeal Swab     Status: None   Collection Time: 11/21/19 10:54 PM   Specimen: Nasopharyngeal Swab  Result Value Ref Range Status   SARS Coronavirus 2 NEGATIVE NEGATIVE Final    Comment: (NOTE) SARS-CoV-2 target nucleic acids are NOT DETECTED. The SARS-CoV-2 RNA is generally detectable in upper and lower respiratory specimens during the acute phase of infection. Negative results do not preclude SARS-CoV-2 infection, do not rule out co-infections with other pathogens, and should not be  used as the sole basis for treatment or other patient management decisions. Negative results must be combined with clinical observations, patient history, and epidemiological information. The expected result is Negative. Fact Sheet for Patients: HairSlick.no Fact Sheet for Healthcare Providers: quierodirigir.com This test is not yet approved or cleared by the [place] FDA and  has been authorized for detection and/or diagnosis of SARS-CoV-2 by FDA under an Emergency Use Authorization (EUA). This EUA will remain  in effect (meaning this test can  be used) for the duration of the COVID-19 declaration under Section 56 4(b)(1) of the Act, 21 U.S.C. section 360bbb-3(b)(1), unless the authorization is terminated or revoked sooner. Performed at [facility], [address], [place], [place] [address]     Coagulation Studies: No results for input(s): LABPROT, INR in the last 72 hours.  Imaging: EEG  Result Date: 11/22/2019 Thana Farr, MD     11/22/2019  1:04 PM ELECTROENCEPHALOGRAM REPORT Patient: [name]       Room #: 239A-AA Age: 44 y.o.        Sex: male Requesting Physician: Mayford Knife Report Date:  11/22/2019       Interpreting Physician: Thana Farr History: CADON RACZKA is an 44 y.o. male with seizure activity s/p ETOH discontinuation Medications: Ativan, Folic acid, Keppra, Thiamine, MVI Conditions of Recording:  This is a 21 channel routine scalp EEG performed with bipolar and monopolar montages arranged in accordance to the international 10/20 system of electrode placement. One channel was dedicated to EKG recording. The patient is in the awake, drowsy and asleep states. Description:  The patient is asleep for most of the recording.  Briefly wakefulness can be evaluated and during these periods the waking background activity consists of a low voltage, symmetrical, fairly well organized, 8.5 Hz alpha activity, seen from the parieto-occipital and posterior temporal regions.  Low voltage fast activity, poorly organized, is seen anteriorly and is at times superimposed on more posterior regions.  A mixture of theta and alpha rhythms are seen from the central and temporal regions. The patient drowses with slowing to irregular, low voltage theta and beta activity.  The patient goes in to a light sleep with symmetrical sleep spindles, vertex central sharp transients and irregular slow activity. No epileptiform activity is noted.  Hyperventilation was not performed.  Intermittent photic stimulation was performed but failed to  illicit any change in the tracing.  IMPRESSION: Normal electroencephalogram, awake, asleep and with activation procedures. There are no focal lateralizing or epileptiform features. Thana Farr, MD Neurology [phone] 11/22/2019, 1:01 PM   CT Head Wo Contrast  Result Date: 11/21/2019 CLINICAL DATA:  Seizure-like activity, fall EXAM: CT HEAD WITHOUT CONTRAST CT CERVICAL SPINE WITHOUT CONTRAST TECHNIQUE: Multidetector CT imaging of the head and cervical spine was performed following the standard protocol without intravenous contrast. Multiplanar CT image reconstructions of the cervical spine were also generated. COMPARISON:  08/07/2018 FINDINGS: CT HEAD FINDINGS Brain: No evidence of acute infarction, hemorrhage, hydrocephalus, extra-axial collection or mass lesion/mass effect. Vascular: No hyperdense vessel or unexpected calcification. Skull: Normal. Negative for fracture or focal lesion. Sinuses/Orbits: No acute finding. Other: Soft tissue contusion about the left temporal fossa (series 3, image 10). CT CERVICAL SPINE FINDINGS Alignment: Straightening of the normal cervical lordosis. Skull base and vertebrae: No acute fracture. No primary bone lesion or focal pathologic process. Soft tissues and spinal canal: No prevertebral fluid or swelling. No visible canal hematoma. Disc levels: Focally mild disc space height loss with anterior osteophytosis  at C5-C6. Upper chest: Mild centrilobular emphysema. Other: None. IMPRESSION: 1.  No acute intracranial pathology. 2.  Soft tissue contusion about the left temporal fossa. 3.  No fracture or static subluxation of the cervical spine. 4.  Emphysema (ICD10-J43.9). Electronically Signed   By: Eddie Candle M.D.   On: 11/21/2019 16:19   CT Cervical Spine Wo Contrast  Result Date: 11/21/2019 CLINICAL DATA:  Seizure-like activity, fall EXAM: CT HEAD WITHOUT CONTRAST CT CERVICAL SPINE WITHOUT CONTRAST TECHNIQUE: Multidetector CT imaging of the head and cervical spine was  performed following the standard protocol without intravenous contrast. Multiplanar CT image reconstructions of the cervical spine were also generated. COMPARISON:  08/07/2018 FINDINGS: CT HEAD FINDINGS Brain: No evidence of acute infarction, hemorrhage, hydrocephalus, extra-axial collection or mass lesion/mass effect. Vascular: No hyperdense vessel or unexpected calcification. Skull: Normal. Negative for fracture or focal lesion. Sinuses/Orbits: No acute finding. Other: Soft tissue contusion about the left temporal fossa (series 3, image 10). CT CERVICAL SPINE FINDINGS Alignment: Straightening of the normal cervical lordosis. Skull base and vertebrae: No acute fracture. No primary bone lesion or focal pathologic process. Soft tissues and spinal canal: No prevertebral fluid or swelling. No visible canal hematoma. Disc levels: Focally mild disc space height loss with anterior osteophytosis at C5-C6. Upper chest: Mild centrilobular emphysema. Other: None. IMPRESSION: 1.  No acute intracranial pathology. 2.  Soft tissue contusion about the left temporal fossa. 3.  No fracture or static subluxation of the cervical spine. 4.  Emphysema (ICD10-J43.9). Electronically Signed   By: Eddie Candle M.D.   On: 11/21/2019 16:19    Medications:  I have reviewed the patient's current medications. Scheduled: . folic acid  1 mg Oral Daily  . heparin  5,000 Units Subcutaneous Q8H  . levETIRAcetam  750 mg Oral BID  . LORazepam  0-4 mg Intravenous Q6H   Followed by  . LORazepam  0-4 mg Intravenous Q12H  . multivitamin with minerals  1 tablet Oral Daily  . thiamine  100 mg Oral Daily   Or  . thiamine  100 mg Intravenous Daily    Assessment/Plan: 44 year old male with a history of ETOH abuse, pancreatitis and PUD who presents with what appears to be ETOH withdrawal seizures.  Patient without a previous history of seizures.  Currently requiring intervention per CIWA protocol.  Head CT personally reviewed and shows no  acute changes.  Patient loaded and maintained on Keppra.  EEG shows no epileptiform activity.  MRI of the brain pending.    Recommendations: 1. MRI of the brain with and without contrast pending.  Unable to answer screening questions for attempt to be made previously 3. Seizure precautions 4. If imaging unremarkable would start taper of Keppra 5. Patient unable to drive, operate heavy machinery, perform activities at heights and participate in water activities until release by outpatient physician. 6. Agree with continued CIWA protocol    LOS: 2 days   Alexis Goodell, MD Neurology [phone] 11/23/2019  2:16 PM

## 2019-11-24 LAB — CBC
HCT: 36.9 % — ABNORMAL LOW (ref 39.0–52.0)
Hemoglobin: 13.5 g/dL (ref 13.0–17.0)
MCH: 32.1 pg (ref 26.0–34.0)
MCHC: 36.6 g/dL — ABNORMAL HIGH (ref 30.0–36.0)
MCV: 87.6 fL (ref 80.0–100.0)
Platelets: 104 10*3/uL — ABNORMAL LOW (ref 150–400)
RBC: 4.21 MIL/uL — ABNORMAL LOW (ref 4.22–5.81)
RDW: 11.9 % (ref 11.5–15.5)
WBC: 8.4 10*3/uL (ref 4.0–10.5)
nRBC: 0 % (ref 0.0–0.2)

## 2019-11-24 LAB — BASIC METABOLIC PANEL
Anion gap: 11 (ref 5–15)
BUN: <5 mg/dL — ABNORMAL LOW (ref 6–20)
CO2: 21 mmol/L — ABNORMAL LOW (ref 22–32)
Calcium: 8.4 mg/dL — ABNORMAL LOW (ref 8.9–10.3)
Chloride: 107 mmol/L (ref 98–111)
Creatinine, Ser: 0.54 mg/dL — ABNORMAL LOW (ref 0.61–1.24)
GFR calc Af Amer: >60 mL/min (ref >60)
GFR calc non Af Amer: >60 mL/min (ref >60)
Glucose, Bld: 115 mg/dL — ABNORMAL HIGH (ref 70–99)
Potassium: 3.3 mmol/L — ABNORMAL LOW (ref 3.5–5.1)
Sodium: 139 mmol/L (ref 135–145)

## 2019-11-24 MED ORDER — LEVETIRACETAM 500 MG PO TABS
500.0000 mg | ORAL_TABLET | Freq: Two times a day (BID) | ORAL | Status: DC
  Administered 2019-11-24 – 2019-11-26 (×4): 500 mg via ORAL
  Filled 2019-11-24 (×4): qty 1

## 2019-11-24 MED ORDER — NEOMYCIN-POLYMYXIN-DEXAMETH 3.5-10000-0.1 OP SUSP
1.0000 [drp] | Freq: Four times a day (QID) | OPHTHALMIC | Status: DC
  Filled 2019-11-24 (×2): qty 5

## 2019-11-24 NOTE — Progress Notes (Deleted)
Reds vest attempted and unsuccessful 2/2 pts habitus. I will continue to assess.

## 2019-11-24 NOTE — Progress Notes (Signed)
PROGRESS NOTE    Lance Green  WUJ:[id number] DOB: Feb 13, 1976 DOA: 11/21/2019 PCP: Patient, No Pcp Per    Brief Narrative:  Lance Green is a 44 y.o. male with medical history significant of chronic alcoholism, peptic ulcer disease, alcoholic pancreatitis and neuropathy who presented to the ER with multiple episodes of seizures.  Patient was observed to have seizure in the ER twice    Consultants:   Neurology  Procedures: EEG  Antimicrobials:   None   Subjective: Patient scored on alcohol withdrawal last time x3 per nursing.  This a.m. scored 11 and was given Ativan.  Currently laying in bed drowsy post Ativan.  But opens his eyes does not complain of anything.  Objective: Vitals:   11/23/19 1651 11/23/19 1917 11/24/19 0610 11/24/19 0743  BP: (!) 155/95 (!) 140/95 (!) 155/98 (!) 163/99  Pulse: 77 72 62 (!) 59  Resp: 18 19 20 18   Temp: 98.6 F (37 C) 98.7 F (37.1 C) 98.6 F (37 C) 98 F (36.7 C)  TempSrc:  Oral Oral   SpO2: 100% 99% 100% 100%  Weight:      Height:        Intake/Output Summary (Last 24 hours) at 11/24/2019 0803 Last data filed at 11/24/2019 0600 Gross per 24 hour  Intake 3484.49 ml  Output 1750 ml  Net 1734.49 ml   Filed Weights   11/21/19 1558 11/21/19 2316  Weight: 68 kg 55.2 kg    Examination:  General exam: Appears calm sleepy, drowsy but opens his eyes to try to respond Respiratory system: Clear to auscultation.  With poor respiratory effort .  No wheezing or rails anteriorly Cardiovascular system: S1 & S2 heard, RRR. No JVD, murmurs, rubs, gallops or clicks. Gastrointestinal system: Abdomen is nondistended, soft and nontender. No organomegaly or masses felt. Normal bowel sounds heard. Central nervous system: Unable to assess since patient is very sleepy and falls asleep on exam, but no asterixis Extremities no edema no cyanosis Skin: Warm and dry     Data Reviewed: I have personally reviewed following labs and imaging  studies  CBC: Recent Labs  Lab 11/21/19 1539 11/22/19 0222 11/23/19 0517 11/24/19 0431  WBC 7.1 9.4 6.8 8.4  NEUTROABS 5.4  --   --   --   HGB 12.6* 12.8* 12.4* 13.5  HCT 35.5* 36.2* 36.1* 36.9*  MCV 90.3 90.0 92.3 87.6  PLT 115* 111* 106* 956*   Basic Metabolic Panel: Recent Labs  Lab 11/21/19 1539 11/22/19 0222 11/23/19 0517 11/24/19 0431  NA 139 135 137 139  K 3.7 3.0* 3.5 3.3*  CL 98 96* 103 107  CO2 25 30 23  21*  GLUCOSE 150* 158* 131* 115*  BUN <5* <5* <5* <5*  CREATININE 0.67 0.70 0.68 0.54*  CALCIUM 9.3 8.9 8.4* 8.4*  MG 1.2* 1.5* 1.7  --   PHOS  --  3.2 2.7  --    GFR: Estimated Creatinine Clearance: 91 mL/min (A) (by C-G formula based on SCr of 0.54 mg/dL (L)). Liver Function Tests: Recent Labs  Lab 11/21/19 1539 11/22/19 0222 11/23/19 0517 11/23/19 1755  AST 187* 131* 91* 99*  ALT 51* 46* 34 34  ALKPHOS 147* 147* 125 122  BILITOT 1.1 1.1 0.9 0.9  PROT 8.2* 7.9 7.1 7.3  ALBUMIN 4.2 3.8 3.4* 3.4*   Recent Labs  Lab 11/21/19 1638  LIPASE 15   No results for input(s): AMMONIA in the last 168 hours. Coagulation Profile: No results for input(s): INR,  PROTIME in the last 168 hours. Cardiac Enzymes: No results for input(s): CKTOTAL, CKMB, CKMBINDEX, TROPONINI in the last 168 hours. BNP (last 3 results) No results for input(s): PROBNP in the last 8760 hours. HbA1C: No results for input(s): HGBA1C in the last 72 hours. CBG: No results for input(s): GLUCAP in the last 168 hours. Lipid Profile: No results for input(s): CHOL, HDL, LDLCALC, TRIG, CHOLHDL, LDLDIRECT in the last 72 hours. Thyroid Function Tests: No results for input(s): TSH, T4TOTAL, FREET4, T3FREE, THYROIDAB in the last 72 hours. Anemia Panel: No results for input(s): VITAMINB12, FOLATE, FERRITIN, TIBC, IRON, RETICCTPCT in the last 72 hours. Sepsis Labs: No results for input(s): PROCALCITON, LATICACIDVEN in the last 168 hours.  Recent Results (from the past 240 hour(s))  SARS  CORONAVIRUS 2 (TAT 6-24 HRS) Nasopharyngeal Nasopharyngeal Swab     Status: None   Collection Time: 11/21/19 10:54 PM   Specimen: Nasopharyngeal Swab  Result Value Ref Range Status   SARS Coronavirus 2 NEGATIVE NEGATIVE Final    Comment: (NOTE) SARS-CoV-2 target nucleic acids are NOT DETECTED. The SARS-CoV-2 RNA is generally detectable in upper and lower respiratory specimens during the acute phase of infection. Negative results do not preclude SARS-CoV-2 infection, do not rule out co-infections with other pathogens, and should not be used as the sole basis for treatment or other patient management decisions. Negative results must be combined with clinical observations, patient history, and epidemiological information. The expected result is Negative. Fact Sheet for Patients: HairSlick.no Fact Sheet for Healthcare Providers: quierodirigir.com This test is not yet approved or cleared by the [place] FDA and  has been authorized for detection and/or diagnosis of SARS-CoV-2 by FDA under an Emergency Use Authorization (EUA). This EUA will remain  in effect (meaning this test can be used) for the duration of the COVID-19 declaration under Section 56 4(b)(1) of the Act, 21 U.S.C. section 360bbb-3(b)(1), unless the authorization is terminated or revoked sooner. Performed at [facility], [address], [place], [place] [address]          Radiology Studies: EEG  Result Date: 11/22/2019 Thana Farr, MD     11/22/2019  1:04 PM ELECTROENCEPHALOGRAM REPORT Patient: Lance Green       Room #: 239A-AA Age: 44 y.o.        Sex: male Requesting Physician: Mayford Knife Report Date:  11/22/2019       Interpreting Physician: Thana Farr History: Lance Green is an 44 y.o. male with seizure activity s/p ETOH discontinuation Medications: Ativan, Folic acid, Keppra, Thiamine, MVI Conditions of Recording:  This is a 21 channel  routine scalp EEG performed with bipolar and monopolar montages arranged in accordance to the international 10/20 system of electrode placement. One channel was dedicated to EKG recording. The patient is in the awake, drowsy and asleep states. Description:  The patient is asleep for most of the recording.  Briefly wakefulness can be evaluated and during these periods the waking background activity consists of a low voltage, symmetrical, fairly well organized, 8.5 Hz alpha activity, seen from the parieto-occipital and posterior temporal regions.  Low voltage fast activity, poorly organized, is seen anteriorly and is at times superimposed on more posterior regions.  A mixture of theta and alpha rhythms are seen from the central and temporal regions. The patient drowses with slowing to irregular, low voltage theta and beta activity.  The patient goes in to a light sleep with symmetrical sleep spindles, vertex central sharp transients and irregular slow  activity. No epileptiform activity is noted.  Hyperventilation was not performed.  Intermittent photic stimulation was performed but failed to illicit any change in the tracing.  IMPRESSION: Normal electroencephalogram, awake, asleep and with activation procedures. There are no focal lateralizing or epileptiform features. Thana Farr, MD Neurology [phone] 11/22/2019, 1:01 PM   MR BRAIN WO CONTRAST  Result Date: 11/23/2019 CLINICAL DATA:  44 year old male with recent seizure like activity and fall. Possible ETOH withdrawal seizures. EXAM: MRI HEAD WITHOUT CONTRAST TECHNIQUE: Multiplanar, multiecho pulse sequences of the brain and surrounding structures were obtained without intravenous contrast. COMPARISON:  Head CT 11/21/2019. FINDINGS: Study is intermittently degraded by motion artifact despite repeated imaging attempts, and the examination had to be discontinued prior to completion of thin coronal images due to patient agitation. Brain: No restricted  diffusion to suggest acute infarction. No midline shift, mass effect, evidence of mass lesion, ventriculomegaly, extra-axial collection or acute intracranial hemorrhage. Cervicomedullary junction and pituitary are within normal limits. Lance Green and white matter signal is within normal limits for age. High-resolution coronal images of the temporal lobes are degraded. No encephalomalacia or chronic cerebral blood products identified. Vascular: Major intracranial vascular flow voids are preserved. Skull and upper cervical spine: Visualized bone marrow signal is within normal limits. Grossly normal visible upper cervical spine. Sinuses/Orbits: Negative orbits. Isolated posterior left ethmoid air cell mucosal thickening is stable. Remaining sinuses and mastoids are well pneumatized. Other: Grossly normal visible internal auditory structures. Left lateral scalp hematoma again noted, most conspicuous on series 7, image 21. IMPRESSION: 1. Motion degraded exam with no acute intracranial abnormality, negative noncontrast MRI appearance of the brain. 2. Left lateral scalp hematoma. Electronically Signed   By: Odessa Fleming M.D.   On: 11/23/2019 15:54        Scheduled Meds:  folic acid  1 mg Oral Daily   levETIRAcetam  750 mg Oral BID   LORazepam  0-4 mg Intravenous Q12H   multivitamin with minerals  1 tablet Oral Daily   thiamine  100 mg Oral Daily   Or   thiamine  100 mg Intravenous Daily   Continuous Infusions:  dextrose 5 % and 0.9% NaCl 125 mL/hr at 11/24/19 9326    Assessment & Plan:   Principal Problem:   Seizure [facility]) Active Problems:   Alcohol abuse   Hypomagnesemia   Seizures:likely secondary to alcohol withdrawal seizures. Continue on thiamine.  CT without acute changes.  Patient loaded and maintained on Keppra.   Neurology following.   MRI of brain pending.   EEG without any epileptiform activity.   Per neurology if MRI of the brain is unremarkable would start taper of Keppra, Keppra  decreased to 500 mg twice daily Seizure precaution Patient unable to drive, operate heavy machinery, perform activities at heights and participate in water activities until release by outpatient physician.   Alcohol abuse: pt reported stopping alcohol use altogether.  Continue on CIWA protocol as he is scoring high.  Continue on thiamine and folic acid.  Transaminitis: secondary to alcohol abuse. Will continue to monitor  Elevated alkaline phosphatase: likely secondary to alcohol abuse. Will continue to monitor   Hypomagnesemia:Repleted, will give another dose to keep magnesium around 2  Continue to monitor  Hypokalemia: Repleted now stable continue to monitor  Thrombocytopenia: likely secondary to alcohol abuse.  But has decreased mildly.  Will DC heparin subcu for now Monitor level  Headaches:likely secondary to fall and seizure. He has soft tissue contusion in the left temporal area where he  is having the pain. Tylenol prn for pain     DVT prophylaxis: scd Code Status: Full Family Communication: None at bedside Disposition Plan: Still scoring high on alcohol withdrawal protocol.  Once he stopped scoring and he is cleared from neurology standpoint will discharge      LOS: 3 days   Time spent: 45 minutes with more than 50% on COC    Lynn Ito, MD [facility] Pager [phone]  If 7PM-7AM, please contact night-coverage [email or web address] Password [facility] 11/24/2019, 8:03 AM Patient ID: Lance Green, male   DOB: 07/01/1976, 44 y.o.   MRN: [id number]

## 2019-11-24 NOTE — Progress Notes (Signed)
Subjective: No further seizure activity noted  Objective: Current vital signs: BP (!) 163/99   Pulse 65   Temp 98 F (36.7 C)   Resp 18   Ht 5\' 7"  (1.702 m)   Wt 55.2 kg   SpO2 100%   BMI 19.08 kg/m  Vital signs in last 24 hours: Temp:  [98 F (36.7 C)-98.7 F (37.1 C)] 98 F (36.7 C) (02/18 0743) Pulse Rate:  [59-77] 65 (02/18 0830) Resp:  [18-20] 18 (02/18 0743) BP: (140-163)/(95-99) 163/99 (02/18 0830) SpO2:  [99 %-100 %] 100 % (02/18 0743)  Intake/Output from previous day: 02/17 0701 - 02/18 0700 In: 3484.5 [I.V.:3484.5] Out: 1750 [Urine:1750] Intake/Output this shift: No intake/output data recorded. Nutritional status:  Diet Order            Diet regular Room service appropriate? Yes; Fluid consistency: Thin  Diet effective now              Neurologic Exam: Mental Status: Lethargic but easily awakened.  Speech slurred but understandable and appropriate.  Follows simple commands.  Fluent.   Cranial Nerves: II: Blinks to bilateral confrontation, pupils equal, round, reactive to light and accommodation III,IV, VI: ptosis not present, extra-ocular motions intact bilaterally V,VII: smile symmetric, facial light touch sensation normal bilaterally VIII: hearing normal bilaterally IX,X: gag reflex present XI: bilateral shoulder shrug XII: midline tongue extension Motor: Lifts all extremities weakly against gravity Sensory: Pinprick and light touch intact throughout, bilaterally  Lab Results: Basic Metabolic Panel: Recent Labs  Lab 11/21/19 1539 11/21/19 1539 11/22/19 0222 11/23/19 0517 11/24/19 0431  NA 139  --  135 137 139  K 3.7  --  3.0* 3.5 3.3*  CL 98  --  96* 103 107  CO2 25  --  30 23 21*  GLUCOSE 150*  --  158* 131* 115*  BUN <5*  --  <5* <5* <5*  CREATININE 0.67  --  0.70 0.68 0.54*  CALCIUM 9.3   < > 8.9 8.4* 8.4*  MG 1.2*  --  1.5* 1.7  --   PHOS  --   --  3.2 2.7  --    < > = values in this interval not displayed.    Liver  Function Tests: Recent Labs  Lab 11/21/19 1539 11/22/19 0222 11/23/19 0517 11/23/19 1755  AST 187* 131* 91* 99*  ALT 51* 46* 34 34  ALKPHOS 147* 147* 125 122  BILITOT 1.1 1.1 0.9 0.9  PROT 8.2* 7.9 7.1 7.3  ALBUMIN 4.2 3.8 3.4* 3.4*   Recent Labs  Lab 11/21/19 1638  LIPASE 15   No results for input(s): AMMONIA in the last 168 hours.  CBC: Recent Labs  Lab 11/21/19 1539 11/22/19 0222 11/23/19 0517 11/24/19 0431  WBC 7.1 9.4 6.8 8.4  NEUTROABS 5.4  --   --   --   HGB 12.6* 12.8* 12.4* 13.5  HCT 35.5* 36.2* 36.1* 36.9*  MCV 90.3 90.0 92.3 87.6  PLT 115* 111* 106* 104*    Cardiac Enzymes: No results for input(s): CKTOTAL, CKMB, CKMBINDEX, TROPONINI in the last 168 hours.  Lipid Panel: No results for input(s): CHOL, TRIG, HDL, CHOLHDL, VLDL, LDLCALC in the last 168 hours.  CBG: No results for input(s): GLUCAP in the last 168 hours.  Microbiology: Results for orders placed or performed during the hospital encounter of 11/21/19  SARS CORONAVIRUS 2 (TAT 6-24 HRS) Nasopharyngeal Nasopharyngeal Swab     Status: None   Collection Time: 11/21/19 10:54 PM  Specimen: Nasopharyngeal Swab  Result Value Ref Range Status   SARS Coronavirus 2 NEGATIVE NEGATIVE Final    Comment: (NOTE) SARS-CoV-2 target nucleic acids are NOT DETECTED. The SARS-CoV-2 RNA is generally detectable in upper and lower respiratory specimens during the acute phase of infection. Negative results do not preclude SARS-CoV-2 infection, do not rule out co-infections with other pathogens, and should not be used as the sole basis for treatment or other patient management decisions. Negative results must be combined with clinical observations, patient history, and epidemiological information. The expected result is Negative. Fact Sheet for Patients: HairSlick.no Fact Sheet for Healthcare Providers: quierodirigir.com This test is not yet approved or  cleared by the [place] FDA and  has been authorized for detection and/or diagnosis of SARS-CoV-2 by FDA under an Emergency Use Authorization (EUA). This EUA will remain  in effect (meaning this test can be used) for the duration of the COVID-19 declaration under Section 56 4(b)(1) of the Act, 21 U.S.C. section 360bbb-3(b)(1), unless the authorization is terminated or revoked sooner. Performed at [facility], [address] [place], [place] [address]     Coagulation Studies: No results for input(s): LABPROT, INR in the last 72 hours.  Imaging: MR BRAIN WO CONTRAST  Result Date: 11/23/2019 CLINICAL DATA:  44 year old male with recent seizure like activity and fall. Possible ETOH withdrawal seizures. EXAM: MRI HEAD WITHOUT CONTRAST TECHNIQUE: Multiplanar, multiecho pulse sequences of the brain and surrounding structures were obtained without intravenous contrast. COMPARISON:  Head CT 11/21/2019. FINDINGS: Study is intermittently degraded by motion artifact despite repeated imaging attempts, and the examination had to be discontinued prior to completion of thin coronal images due to patient agitation. Brain: No restricted diffusion to suggest acute infarction. No midline shift, mass effect, evidence of mass lesion, ventriculomegaly, extra-axial collection or acute intracranial hemorrhage. Cervicomedullary junction and pituitary are within normal limits. Wallace Cullens and white matter signal is within normal limits for age. High-resolution coronal images of the temporal lobes are degraded. No encephalomalacia or chronic cerebral blood products identified. Vascular: Major intracranial vascular flow voids are preserved. Skull and upper cervical spine: Visualized bone marrow signal is within normal limits. Grossly normal visible upper cervical spine. Sinuses/Orbits: Negative orbits. Isolated posterior left ethmoid air cell mucosal thickening is stable. Remaining sinuses and mastoids are well  pneumatized. Other: Grossly normal visible internal auditory structures. Left lateral scalp hematoma again noted, most conspicuous on series 7, image 21. IMPRESSION: 1. Motion degraded exam with no acute intracranial abnormality, negative noncontrast MRI appearance of the brain. 2. Left lateral scalp hematoma. Electronically Signed   By: Odessa Fleming M.D.   On: 11/23/2019 15:54    Medications:  I have reviewed the patient's current medications. Scheduled: . folic acid  1 mg Oral Daily  . levETIRAcetam  750 mg Oral BID  . LORazepam  0-4 mg Intravenous Q12H  . multivitamin with minerals  1 tablet Oral Daily  . thiamine  100 mg Oral Daily   Or  . thiamine  100 mg Intravenous Daily    Assessment/Plan: 44 year old male with a history of ETOH abuse, pancreatitis and PUD who presents with what appears to be ETOH withdrawal seizures. Patient without a previous history of seizures. Currently requiring intervention per CIWA protocol. Head CT personally reviewed and shows no acute changes. EEG shows no epileptiform activity.  MRI of the brain with movement artifact but shows no evidence of acute abnormalities.  Patient on Keppra    Recommendations: 1. Decrease  Keppra to 500mg  BID    LOS: 3 days   , MD Neurology [phone] 11/24/2019  2:21 PM

## 2019-11-24 NOTE — Plan of Care (Signed)
  Problem: Safety: Goal: Ability to remain free from injury will improve Outcome: Not Progressing  [place], trying to get out of bed

## 2019-11-25 ENCOUNTER — Inpatient Hospital Stay: Admit: 2019-11-25 | Payer: Self-pay

## 2019-11-25 LAB — COMPREHENSIVE METABOLIC PANEL
ALT: 34 U/L (ref 0–44)
AST: 86 U/L — ABNORMAL HIGH (ref 15–41)
Albumin: 3 g/dL — ABNORMAL LOW (ref 3.5–5.0)
Alkaline Phosphatase: 112 U/L (ref 38–126)
Anion gap: 9 (ref 5–15)
BUN: <5 mg/dL — ABNORMAL LOW (ref 6–20)
CO2: 23 mmol/L (ref 22–32)
Calcium: 8 mg/dL — ABNORMAL LOW (ref 8.9–10.3)
Chloride: 106 mmol/L (ref 98–111)
Creatinine, Ser: 0.57 mg/dL — ABNORMAL LOW (ref 0.61–1.24)
GFR calc Af Amer: >60 mL/min (ref >60)
GFR calc non Af Amer: >60 mL/min (ref >60)
Glucose, Bld: 101 mg/dL — ABNORMAL HIGH (ref 70–99)
Potassium: 3.1 mmol/L — ABNORMAL LOW (ref 3.5–5.1)
Sodium: 138 mmol/L (ref 135–145)
Total Bilirubin: 0.9 mg/dL (ref 0.3–1.2)
Total Protein: 6.5 g/dL (ref 6.5–8.1)

## 2019-11-25 MED ORDER — HYDRALAZINE HCL 20 MG/ML IJ SOLN: 10.0000 mg | Freq: Four times a day (QID) | INTRAMUSCULAR | Status: DC | PRN

## 2019-11-25 MED ORDER — LABETALOL HCL 5 MG/ML IV SOLN: 10.0000 mg | Freq: Four times a day (QID) | INTRAVENOUS | Status: DC | PRN

## 2019-11-25 MED ORDER — LORAZEPAM 2 MG/ML IJ SOLN
1.0000 mg | INTRAMUSCULAR | Status: AC | PRN
  Administered 2019-11-25 – 2019-11-26 (×2): 4 mg via INTRAVENOUS
  Filled 2019-11-25 (×3): qty 2

## 2019-11-25 MED ORDER — LORAZEPAM 1 MG PO TABS: 1.0000 mg | ORAL_TABLET | ORAL | Status: AC | PRN

## 2019-11-25 MED ORDER — AMLODIPINE BESYLATE 5 MG PO TABS
2.5000 mg | ORAL_TABLET | Freq: Every day | ORAL | Status: DC
  Administered 2019-11-25 – 2019-11-28 (×4): 2.5 mg via ORAL
  Filled 2019-11-25 (×4): qty 1

## 2019-11-25 MED ORDER — LORAZEPAM 2 MG/ML IJ SOLN: 0.0000 mg | Freq: Two times a day (BID) | INTRAMUSCULAR | Status: DC

## 2019-11-25 MED ORDER — LORAZEPAM 2 MG/ML IJ SOLN
1.0000 mg | Freq: Four times a day (QID) | INTRAMUSCULAR | Status: DC | PRN
  Administered 2019-11-25: 1 mg via INTRAVENOUS
  Filled 2019-11-25: qty 1

## 2019-11-25 MED ORDER — MAGNESIUM SULFATE 2 GM/50ML IV SOLN
2.0000 g | Freq: Once | INTRAVENOUS | Status: AC
  Administered 2019-11-25: 2 g via INTRAVENOUS
  Filled 2019-11-25: qty 50

## 2019-11-25 MED ORDER — LORAZEPAM 2 MG/ML IJ SOLN
0.0000 mg | Freq: Four times a day (QID) | INTRAMUSCULAR | Status: AC
  Administered 2019-11-25: 2 mg via INTRAVENOUS
  Administered 2019-11-25 – 2019-11-26 (×4): 4 mg via INTRAVENOUS
  Filled 2019-11-25 (×3): qty 2
  Filled 2019-11-25: qty 1
  Filled 2019-11-25: qty 2

## 2019-11-25 MED ORDER — NEOMYCIN-POLYMYXIN-DEXAMETH 3.5-10000-0.1 OP SUSP
1.0000 [drp] | Freq: Four times a day (QID) | OPHTHALMIC | Status: DC
  Administered 2019-11-25 – 2019-11-28 (×10): 1 [drp] via OPHTHALMIC
  Filled 2019-11-25 (×3): qty 5

## 2019-11-25 MED ORDER — LORAZEPAM 2 MG/ML IJ SOLN
1.0000 mg | Freq: Once | INTRAMUSCULAR | Status: AC
  Administered 2019-11-25: 1 mg via INTRAVENOUS
  Filled 2019-11-25: qty 1

## 2019-11-25 MED ORDER — POTASSIUM CHLORIDE CRYS ER 20 MEQ PO TBCR
40.0000 meq | EXTENDED_RELEASE_TABLET | Freq: Once | ORAL | Status: AC
  Administered 2019-11-25: 16:00:00 40 meq via ORAL
  Filled 2019-11-25: qty 2

## 2019-11-25 NOTE — Progress Notes (Signed)
PROGRESS NOTE    Lance Green  WUJ:[id number] DOB: 05-11-1976 DOA: 11/21/2019 PCP: Patient, No Pcp Per    Brief Narrative:  Lance Green is a 44 y.o. male with medical history significant of chronic alcoholism, peptic ulcer disease, alcoholic pancreatitis and neuropathy who presented to the ER with multiple episodes of seizures.  Patient was observed to have seizure in the ER twice    Consultants:   Neurology  Procedures: EEG  Antimicrobials:   None   Subjective: Pt was scoring on CIWA with score of 12  Was given Ativan.  When I saw the patient again he was lethargic and sleepy not arousable for exam.  Nursing called the telemetry revealed patient with bradycardia with 3 beats of third-degree block, however patient was asymptomatic after I had seen pt this am.  Objective: Vitals:   11/24/19 1400 11/24/19 1512 11/24/19 2006 11/25/19 0437  BP: (!) 155/87 (!) 156/92 (!) 151/94 (!) 143/84  Pulse: 72 64 69 63  Resp:  19 20 19   Temp:  98.3 F (36.8 C) 97.9 F (36.6 C) 97.8 F (36.6 C)  TempSrc:   Oral Oral  SpO2:   100% 100%  Weight:      Height:        Intake/Output Summary (Last 24 hours) at 11/25/2019 0741 Last data filed at 11/25/2019 0446 Gross per 24 hour  Intake 2568.58 ml  Output 1450 ml  Net 1118.58 ml   Filed Weights   11/21/19 1558 11/21/19 2316  Weight: 68 kg 55.2 kg    Examination:  General exam: sleeping, non arousable Respiratory system: Clear to auscultation.  With poor respiratory effort anteriorly.   Cardiovascular system: S1 & S2 heard, RRR. No murmurs, rubs, gallops or clicks. Gastrointestinal system: Abdomen is nondistended, soft and nontender. Normal bowel sounds heard. Central nervous system: Unable to assess since patient is very sleepy Extremities no edema no cyanosis Skin: Warm and dry     Data Reviewed: I have personally reviewed following labs and imaging studies  CBC: Recent Labs  Lab 11/21/19 1539 11/22/19 0222  11/23/19 0517 11/24/19 0431  WBC 7.1 9.4 6.8 8.4  NEUTROABS 5.4  --   --   --   HGB 12.6* 12.8* 12.4* 13.5  HCT 35.5* 36.2* 36.1* 36.9*  MCV 90.3 90.0 92.3 87.6  PLT 115* 111* 106* 104*   Basic Metabolic Panel: Recent Labs  Lab 11/21/19 1539 11/22/19 0222 11/23/19 0517 11/24/19 0431 11/25/19 0642  NA 139 135 137 139 138  K 3.7 3.0* 3.5 3.3* 3.1*  CL 98 96* 103 107 106  CO2 25 30 23  21* 23  GLUCOSE 150* 158* 131* 115* 101*  BUN <5* <5* <5* <5* <5*  CREATININE 0.67 0.70 0.68 0.54* 0.57*  CALCIUM 9.3 8.9 8.4* 8.4* 8.0*  MG 1.2* 1.5* 1.7  --   --   PHOS  --  3.2 2.7  --   --    GFR: Estimated Creatinine Clearance: 91 mL/min (A) (by C-G formula based on SCr of 0.57 mg/dL (L)). Liver Function Tests: Recent Labs  Lab 11/21/19 1539 11/22/19 0222 11/23/19 0517 11/23/19 1755 11/25/19 0642  AST 187* 131* 91* 99* 86*  ALT 51* 46* 34 34 34  ALKPHOS 147* 147* 125 122 112  BILITOT 1.1 1.1 0.9 0.9 0.9  PROT 8.2* 7.9 7.1 7.3 6.5  ALBUMIN 4.2 3.8 3.4* 3.4* 3.0*   Recent Labs  Lab 11/21/19 1638  LIPASE 15   No results for input(s): AMMONIA in  the last 168 hours. Coagulation Profile: No results for input(s): INR, PROTIME in the last 168 hours. Cardiac Enzymes: No results for input(s): CKTOTAL, CKMB, CKMBINDEX, TROPONINI in the last 168 hours. BNP (last 3 results) No results for input(s): PROBNP in the last 8760 hours. HbA1C: No results for input(s): HGBA1C in the last 72 hours. CBG: No results for input(s): GLUCAP in the last 168 hours. Lipid Profile: No results for input(s): CHOL, HDL, LDLCALC, TRIG, CHOLHDL, LDLDIRECT in the last 72 hours. Thyroid Function Tests: No results for input(s): TSH, T4TOTAL, FREET4, T3FREE, THYROIDAB in the last 72 hours. Anemia Panel: No results for input(s): VITAMINB12, FOLATE, FERRITIN, TIBC, IRON, RETICCTPCT in the last 72 hours. Sepsis Labs: No results for input(s): PROCALCITON, LATICACIDVEN in the last 168 hours.  Recent Results  (from the past 240 hour(s))  SARS CORONAVIRUS 2 (TAT 6-24 HRS) Nasopharyngeal Nasopharyngeal Swab     Status: None   Collection Time: 11/21/19 10:54 PM   Specimen: Nasopharyngeal Swab  Result Value Ref Range Status   SARS Coronavirus 2 NEGATIVE NEGATIVE Final    Comment: (NOTE) SARS-CoV-2 target nucleic acids are NOT DETECTED. The SARS-CoV-2 RNA is generally detectable in upper and lower respiratory specimens during the acute phase of infection. Negative results do not preclude SARS-CoV-2 infection, do not rule out co-infections with other pathogens, and should not be used as the sole basis for treatment or other patient management decisions. Negative results must be combined with clinical observations, patient history, and epidemiological information. The expected result is Negative. Fact Sheet for Patients: SugarRoll.be Fact Sheet for Healthcare Providers: https://www.woods-mathews.com/ This test is not yet approved or cleared by the [place] FDA and  has been authorized for detection and/or diagnosis of SARS-CoV-2 by FDA under an Emergency Use Authorization (EUA). This EUA will remain  in effect (meaning this test can be used) for the duration of the COVID-19 declaration under Section 56 4(b)(1) of the Act, 21 U.S.C. section 360bbb-3(b)(1), unless the authorization is terminated or revoked sooner. Performed at [facility], [place] [address], [place], [place] [address]          Radiology Studies: MR BRAIN WO CONTRAST  Result Date: 11/23/2019 CLINICAL DATA:  44 year old male with recent seizure like activity and fall. Possible ETOH withdrawal seizures. EXAM: MRI HEAD WITHOUT CONTRAST TECHNIQUE: Multiplanar, multiecho pulse sequences of the brain and surrounding structures were obtained without intravenous contrast. COMPARISON:  Head CT 11/21/2019. FINDINGS: Study is intermittently degraded by motion artifact despite repeated  imaging attempts, and the examination had to be discontinued prior to completion of thin coronal images due to patient agitation. Brain: No restricted diffusion to suggest acute infarction. No midline shift, mass effect, evidence of mass lesion, ventriculomegaly, extra-axial collection or acute intracranial hemorrhage. Cervicomedullary junction and pituitary are within normal limits. Pearline Cables and white matter signal is within normal limits for age. High-resolution coronal images of the temporal lobes are degraded. No encephalomalacia or chronic cerebral blood products identified. Vascular: Major intracranial vascular flow voids are preserved. Skull and upper cervical spine: Visualized bone marrow signal is within normal limits. Grossly normal visible upper cervical spine. Sinuses/Orbits: Negative orbits. Isolated posterior left ethmoid air cell mucosal thickening is stable. Remaining sinuses and mastoids are well pneumatized. Other: Grossly normal visible internal auditory structures. Left lateral scalp hematoma again noted, most conspicuous on series 7, image 21. IMPRESSION: 1. Motion degraded exam with no acute intracranial abnormality, negative noncontrast MRI appearance of the brain. 2. Left lateral scalp hematoma. Electronically Signed  By: Odessa Fleming M.D.   On: 11/23/2019 15:54        Scheduled Meds: . folic acid  1 mg Oral Daily  . levETIRAcetam  500 mg Oral BID  . multivitamin with minerals  1 tablet Oral Daily  . neomycin-polymyxin b-dexamethasone  1 drop Both Eyes 4x daily  . thiamine  100 mg Oral Daily   Or  . thiamine  100 mg Intravenous Daily   Continuous Infusions: . dextrose 5 % and 0.9% NaCl 125 mL/hr at 11/25/19 0729    Assessment & Plan:   Principal Problem:   Seizure [facility]) Active Problems:   Alcohol abuse   Hypomagnesemia   Seizures:likely secondary to alcohol withdrawal seizures. Continue on thiamine.  CT without acute changes.  Patient loaded and maintained on Keppra.     Neurology following.   MRI of brain pending.   EEG without any epileptiform activity.   Per neurology if MRI of the brain is unremarkable would start taper of Keppra, Keppra decreased to 500 mg twice daily Seizure precaution Patient unable to drive, operate heavy machinery, perform activities at heights and participate in water activities until release by outpatient physician.   Alcohol abuse: pt reported stopping alcohol use altogether.  Continue on CIWA protocol as he is continuing to scoring high.  Continue on thiamine and folic acid.  Transaminitis: secondary to alcohol abuse, improving. Will continue to monitor  Elevated alkaline phosphatase: likely secondary to alcohol abuse, improving. Will continue to monitor   Hypomagnesemia:Repleted, will give another dose to keep magnesium around 2  Continue to monitor  Hypokalemia: Will replace as K is 3.1. Monitor level  Thrombocytopenia: likely secondary to alcohol abuse.  But has decreased mildly.  Will DC heparin subcu for now Monitor level  Headaches:likely secondary to fall and seizure. He has soft tissue contusion in the left temporal area where he is having the pain. Tylenol prn for pain     DVT prophylaxis: scd Code Status: Full Family Communication: None at bedside Disposition Plan: Still scoring high on alcohol withdrawal protocol.  Once he stopped scoring and he is cleared from neurology standpoint will discharge      LOS: 4 days   Time spent: 45 minutes with more than 50% on COC    Lynn Ito, MD [facility] Pager [phone]  If 7PM-7AM, please contact night-coverage [email or web address] Password [facility] 11/25/2019, 7:41 AM Patient ID: Lance Green, male   DOB: July 10, 1976, 45 y.o.   Lance Green Patient ID: Lance Green, male   DOB: [birth year], 44 y.o.   Lance Green

## 2019-11-25 NOTE — Progress Notes (Signed)
Pt very agitated at this time tryiing to get out of bed, believing that it is 2:30 in the afternoon. Pt is requesting is shoes, clothing and keys so that he can,"get out of here, he is tired of this -hit". At times unable to understand what the patient is saying,language garbled.

## 2019-11-25 NOTE — Consult Note (Signed)
CARDIOLOGY CONSULT NOTE               Patient ID: Lance Green MRN: [id number] DOB/AGE: 10/10/1975 44 y.o.  Admit date: 11/21/2019 Referring Physician: Nolberto Hanlon, MD  Primary Physician: No PCP  Primary Cardiologist: No Primary Cardiologist  Reason for Consultation: bradycardia with concerns for 3rd degree AV block  HPI: Lance Green is a 44 year old male with PMH significant for chronic alcoholism, peptic ulcer disease, alcoholic pancreatitis and neuropathy who presented to the ED with c/o recurrent seizures. Two days prior to admission, the patient abruptly stopped consuming alcohol. Prior to that, the patient heavily consumed concentrated liquor on a daily basis. The patient was noted to have an addition two seizures during his ED course and was found to have the following abnormal labs: Magnesium= 1.2, lipase = 50, AST = 187, ALT = 51 and total protein = 8.2. CT scans of the spine and head were unremarkable.   He was treated with a loading dose of Keppra, Neurology was consulted and the patient was admitted to the telemetry unit.   While on the unit, the patient was noted to be confused with CIWA scores >4 and requiring multiple doses of ativan for treatment. EEG was reasonably unremarkable. On today, the patient was noted to have an episode of bradycardia with a HR= 39 bpm with possible 3rd degree AVB. The patient was asymptomatic. Therefore, Cardiology was consulted for further evaluation.  Upon examination the patient was AOx3 with intermittent confusion. The patient was noted to be in NSR per the telemetry monitor and his HR= 87 bpm at that time. The patient denies all dizziness, syncope, chest pain, dyspnea or palpitations at this time. The patient denies any history of bradycardia, irregular heart rhythm or heart disease. The patient's mother was at the bedside and confirmed all information as well. The patient appears disheveled and without any signs of acute distress. Patient is  noted to be hypokalemic with a potassium= 3.1.      Review of systems complete and found to be negative unless listed above     Past Medical History:  Diagnosis Date   Alcohol abuse    Neuropathy    Pancreatitis    Peptic ulcer disease     Past Surgical History:  Procedure Laterality Date   APPENDECTOMY     INCISION AND DRAINAGE Left 11/11/2018   Procedure: INCISION AND DRAINAGE;  Surgeon: Dereck Leep, MD;  Location: [facility] ORS;  Service: Orthopedics;  Laterality: Left;   LAPAROSCOPIC APPENDECTOMY N/A 07/06/2018   Procedure: APPENDECTOMY LAPAROSCOPIC;  Surgeon: Herbert Pun, MD;  Location: [facility] ORS;  Service: General;  Laterality: N/A;    Medications Prior to Admission  Medication Sig Dispense Refill Last Dose   predniSONE (DELTASONE) 10 MG tablet Take 6 tablets  today, on day 2 take 5 tablets, day 3 take 4 tablets, day 4 take 3 tablets, day 5 take  2 tablets and 1 tablet the last day (Patient not taking: Reported on 11/21/2019) 21 tablet 0 Completed Course at Unknown time   sulfamethoxazole-trimethoprim (BACTRIM DS) 800-160 MG tablet Take 1 tablet by mouth 2 (two) times daily. (Patient not taking: Reported on 11/21/2019) 20 tablet 0 Completed Course at Unknown time   Social History   Socioeconomic History   Marital status: Single    Spouse name: Not on file   Number of children: Not on file   Years of education: Not on file   Highest education level: Not on file  Occupational History   Not on file  Tobacco Use   Smoking status: Current Every Day Smoker    Packs/day: 0.50    Types: Cigarettes   Smokeless tobacco: Never Used  Substance and Sexual Activity   Alcohol use: Yes    Comment: daily 40 oz, reports 4 40oz/day   Drug use: No   Sexual activity: Not on file  Other Topics Concern   Not on file  Social History Narrative   Not on file   Social Determinants of Health   Financial Resource Strain:    Difficulty of Paying Living  Expenses: Not on file  Food Insecurity:    Worried About Running Out of Food in the Last Year: Not on file   Ran Out of Food in the Last Year: Not on file  Transportation Needs:    Lack of Transportation (Medical): Not on file   Lack of Transportation (Non-Medical): Not on file  Physical Activity:    Days of Exercise per Week: Not on file   Minutes of Exercise per Session: Not on file  Stress:    Feeling of Stress : Not on file  Social Connections:    Frequency of Communication with Friends and Family: Not on file   Frequency of Social Gatherings with Friends and Family: Not on file   Attends Religious Services: Not on file   Active Member of Clubs or Organizations: Not on file   Attends Banker Meetings: Not on file   Marital Status: Not on file  Intimate Partner Violence:    Fear of Current or Ex-Partner: Not on file   Emotionally Abused: Not on file   Physically Abused: Not on file   Sexually Abused: Not on file    History reviewed. No pertinent family history.    Review of systems complete and found to be negative unless listed above   PHYSICAL EXAM  General: dishelved, appears malnourished, in no acute distress HEENT:  Normocephalic and atramatic Neck:  No JVD.  Lungs: Clear bilaterally to auscultation Heart: HRRR . Normal S1 and S2 without gallops or murmurs.  Abdomen: Bowel sounds are positive, abdomen soft and non-tender  Msk:  UTA due to patient's underlying confusion and inability to ambulate at this time. Extremities: No clubbing, cyanosis or edema.   Neuro: Alert and oriented X 3 with intermittent confusion. Psych:  flat affect, responds appropriately at times   Labs:   Lab Results  Component Value Date   WBC 8.4 11/24/2019   HGB 13.5 11/24/2019   HCT 36.9 (L) 11/24/2019   MCV 87.6 11/24/2019   PLT 104 (L) 11/24/2019    Recent Labs  Lab 11/25/19 0642  NA 138  K 3.1*  CL 106  CO2 23  BUN <5*  CREATININE 0.57*    CALCIUM 8.0*  PROT 6.5  BILITOT 0.9  ALKPHOS 112  ALT 34  AST 86*  GLUCOSE 101*   Lab Results  Component Value Date   TROPONINI <0.03 09/21/2017   No results found for: CHOL No results found for: HDL No results found for: LDLCALC No results found for: TRIG No results found for: CHOLHDL No results found for: LDLDIRECT    Radiology: EEG  Result Date: 11/22/2019 Thana Farr, MD     11/22/2019  1:04 PM ELECTROENCEPHALOGRAM REPORT Patient: [name]       Room #: 239A-AA Age: 44 y.o.        Sex: male Requesting Physician: Mayford Knife Report Date:  11/22/2019  Interpreting Physician: Thana Farr History: HUBBARD SELDON is an 44 y.o. male with seizure activity s/p ETOH discontinuation Medications: Ativan, Folic acid, Keppra, Thiamine, MVI Conditions of Recording:  This is a 21 channel routine scalp EEG performed with bipolar and monopolar montages arranged in accordance to the international 10/20 system of electrode placement. One channel was dedicated to EKG recording. The patient is in the awake, drowsy and asleep states. Description:  The patient is asleep for most of the recording.  Briefly wakefulness can be evaluated and during these periods the waking background activity consists of a low voltage, symmetrical, fairly well organized, 8.5 Hz alpha activity, seen from the parieto-occipital and posterior temporal regions.  Low voltage fast activity, poorly organized, is seen anteriorly and is at times superimposed on more posterior regions.  A mixture of theta and alpha rhythms are seen from the central and temporal regions. The patient drowses with slowing to irregular, low voltage theta and beta activity.  The patient goes in to a light sleep with symmetrical sleep spindles, vertex central sharp transients and irregular slow activity. No epileptiform activity is noted.  Hyperventilation was not performed.  Intermittent photic stimulation was performed but failed to illicit any  change in the tracing.  IMPRESSION: Normal electroencephalogram, awake, asleep and with activation procedures. There are no focal lateralizing or epileptiform features. Thana Farr, MD Neurology [phone] 11/22/2019, 1:01 PM   CT Head Wo Contrast  Result Date: 11/21/2019 CLINICAL DATA:  Seizure-like activity, fall EXAM: CT HEAD WITHOUT CONTRAST CT CERVICAL SPINE WITHOUT CONTRAST TECHNIQUE: Multidetector CT imaging of the head and cervical spine was performed following the standard protocol without intravenous contrast. Multiplanar CT image reconstructions of the cervical spine were also generated. COMPARISON:  08/07/2018 FINDINGS: CT HEAD FINDINGS Brain: No evidence of acute infarction, hemorrhage, hydrocephalus, extra-axial collection or mass lesion/mass effect. Vascular: No hyperdense vessel or unexpected calcification. Skull: Normal. Negative for fracture or focal lesion. Sinuses/Orbits: No acute finding. Other: Soft tissue contusion about the left temporal fossa (series 3, image 10). CT CERVICAL SPINE FINDINGS Alignment: Straightening of the normal cervical lordosis. Skull base and vertebrae: No acute fracture. No primary bone lesion or focal pathologic process. Soft tissues and spinal canal: No prevertebral fluid or swelling. No visible canal hematoma. Disc levels: Focally mild disc space height loss with anterior osteophytosis at C5-C6. Upper chest: Mild centrilobular emphysema. Other: None. IMPRESSION: 1.  No acute intracranial pathology. 2.  Soft tissue contusion about the left temporal fossa. 3.  No fracture or static subluxation of the cervical spine. 4.  Emphysema (ICD10-J43.9). Electronically Signed   By: Lauralyn Primes M.D.   On: 11/21/2019 16:19   CT Cervical Spine Wo Contrast  Result Date: 11/21/2019 CLINICAL DATA:  Seizure-like activity, fall EXAM: CT HEAD WITHOUT CONTRAST CT CERVICAL SPINE WITHOUT CONTRAST TECHNIQUE: Multidetector CT imaging of the head and cervical spine was performed  following the standard protocol without intravenous contrast. Multiplanar CT image reconstructions of the cervical spine were also generated. COMPARISON:  08/07/2018 FINDINGS: CT HEAD FINDINGS Brain: No evidence of acute infarction, hemorrhage, hydrocephalus, extra-axial collection or mass lesion/mass effect. Vascular: No hyperdense vessel or unexpected calcification. Skull: Normal. Negative for fracture or focal lesion. Sinuses/Orbits: No acute finding. Other: Soft tissue contusion about the left temporal fossa (series 3, image 10). CT CERVICAL SPINE FINDINGS Alignment: Straightening of the normal cervical lordosis. Skull base and vertebrae: No acute fracture. No primary bone lesion or focal pathologic process. Soft tissues and spinal canal: No prevertebral fluid or  swelling. No visible canal hematoma. Disc levels: Focally mild disc space height loss with anterior osteophytosis at C5-C6. Upper chest: Mild centrilobular emphysema. Other: None. IMPRESSION: 1.  No acute intracranial pathology. 2.  Soft tissue contusion about the left temporal fossa. 3.  No fracture or static subluxation of the cervical spine. 4.  Emphysema (ICD10-J43.9). Electronically Signed   By: Lauralyn Primes M.D.   On: 11/21/2019 16:19   MR BRAIN WO CONTRAST  Result Date: 11/23/2019 CLINICAL DATA:  44 year old male with recent seizure like activity and fall. Possible ETOH withdrawal seizures. EXAM: MRI HEAD WITHOUT CONTRAST TECHNIQUE: Multiplanar, multiecho pulse sequences of the brain and surrounding structures were obtained without intravenous contrast. COMPARISON:  Head CT 11/21/2019. FINDINGS: Study is intermittently degraded by motion artifact despite repeated imaging attempts, and the examination had to be discontinued prior to completion of thin coronal images due to patient agitation. Brain: No restricted diffusion to suggest acute infarction. No midline shift, mass effect, evidence of mass lesion, ventriculomegaly, extra-axial  collection or acute intracranial hemorrhage. Cervicomedullary junction and pituitary are within normal limits. Wallace Cullens and Demarcus Thielke matter signal is within normal limits for age. High-resolution coronal images of the temporal lobes are degraded. No encephalomalacia or chronic cerebral blood products identified. Vascular: Major intracranial vascular flow voids are preserved. Skull and upper cervical spine: Visualized bone marrow signal is within normal limits. Grossly normal visible upper cervical spine. Sinuses/Orbits: Negative orbits. Isolated posterior left ethmoid air cell mucosal thickening is stable. Remaining sinuses and mastoids are well pneumatized. Other: Grossly normal visible internal auditory structures. Left lateral scalp hematoma again noted, most conspicuous on series 7, image 21. IMPRESSION: 1. Motion degraded exam with no acute intracranial abnormality, negative noncontrast MRI appearance of the brain. 2. Left lateral scalp hematoma. Electronically Signed   By: Odessa Fleming M.D.   On: 11/23/2019 15:54    EKG: NSR  ASSESSMENT AND PLAN:  1. Acute Alcohol Withdrawal 2. Seizures 3.Hypokalemia  4. Hypomagnesemia 5. Hypertension    Plan and Recommendations: 1. Acute Alcohol Withdrawal:  patient abruptly stopped consuming alcohol two days prior to admission.   -Please continue CIWA protocol and treatment with PRN ativan.  -Please continue to monitor VSS every 4 hours  2. Seizures likely secondary to alcohol withdrawal, Neurology consulted, patient continues to have intermittent confusion.  -Please continue management with Keppra and closely monitoring  for any seizure-like activity.   3. Hypokalemia, patient's current potassium level=3.1.   -Please replace electrolye with potassium orally and recheck  BMP.   4. Hypomagnesemia, resolved, patient's Magnesium level= 1.7  -Please continue to monitor Magnesium level with BMP check.  5. Hypertension, patient is have multiple epsidoes of  hypertension with current bp= 148/91.  -We will start Labetalol 10 mg every 6 hours PRN systolic bp >160 or  diastolic bp>100.   Discussed with Dr. Juliann Pares who also evaluated the patient and the plan was made in collaboration with him.    Signed: Harl Favor NP, MSN, ACNPC-AG 11/25/2019, 2:25 PM

## 2019-11-25 NOTE — Progress Notes (Signed)
CCMD notified RN of SB into 30s and 3 beats of 3degree HB. MD notified. I will await any new orders. I will continue to assess.

## 2019-11-26 ENCOUNTER — Inpatient Hospital Stay: Payer: Self-pay

## 2019-11-26 ENCOUNTER — Encounter: Payer: Self-pay | Admitting: Internal Medicine

## 2019-11-26 LAB — GLUCOSE, CAPILLARY
Glucose-Capillary: 81 mg/dL (ref 70–99)
Glucose-Capillary: 89 mg/dL (ref 70–99)
Glucose-Capillary: 112 mg/dL — ABNORMAL HIGH (ref 70–99)

## 2019-11-26 LAB — MAGNESIUM: Magnesium: 1.5 mg/dL — ABNORMAL LOW (ref 1.7–2.4)

## 2019-11-26 LAB — MRSA PCR SCREENING: MRSA by PCR: NEGATIVE

## 2019-11-26 LAB — POTASSIUM: Potassium: 2.8 mmol/L — ABNORMAL LOW (ref 3.5–5.1)

## 2019-11-26 MED ORDER — DEXTROSE IN LACTATED RINGERS 5 % IV SOLN: INTRAVENOUS | Status: DC

## 2019-11-26 MED ORDER — POTASSIUM CHLORIDE 10 MEQ/100ML IV SOLN
10.0000 meq | INTRAVENOUS | Status: AC
  Administered 2019-11-26 (×4): 10 meq via INTRAVENOUS
  Filled 2019-11-26 (×4): qty 100

## 2019-11-26 MED ORDER — CHLORHEXIDINE GLUCONATE CLOTH 2 % EX PADS
6.0000 | MEDICATED_PAD | Freq: Every day | CUTANEOUS | Status: DC
  Administered 2019-11-26 – 2019-11-27 (×2): 6 via TOPICAL

## 2019-11-26 MED ORDER — DOXEPIN HCL 10 MG PO CAPS
10.0000 mg | ORAL_CAPSULE | Freq: Every evening | ORAL | Status: DC | PRN
  Administered 2019-11-26: 10 mg via ORAL
  Filled 2019-11-26 (×2): qty 1

## 2019-11-26 MED ORDER — THIAMINE HCL 100 MG/ML IJ SOLN
500.0000 mg | Freq: Every day | INTRAVENOUS | Status: DC
  Administered 2019-11-26 – 2019-11-28 (×3): 500 mg via INTRAVENOUS
  Filled 2019-11-26 (×3): qty 5

## 2019-11-26 MED ORDER — LEVETIRACETAM 250 MG PO TABS
250.0000 mg | ORAL_TABLET | Freq: Two times a day (BID) | ORAL | Status: DC
  Administered 2019-11-26 – 2019-11-28 (×4): 250 mg via ORAL
  Filled 2019-11-26 (×6): qty 1

## 2019-11-26 MED ORDER — DEXMEDETOMIDINE HCL IN NACL 400 MCG/100ML IV SOLN
0.4000 ug/kg/h | INTRAVENOUS | Status: DC
  Administered 2019-11-26: 0.4 ug/kg/h via INTRAVENOUS
  Filled 2019-11-26: qty 100

## 2019-11-26 MED ORDER — MAGNESIUM SULFATE 2 GM/50ML IV SOLN
2.0000 g | Freq: Once | INTRAVENOUS | Status: AC
  Administered 2019-11-26: 2 g via INTRAVENOUS
  Filled 2019-11-26: qty 50

## 2019-11-26 NOTE — Progress Notes (Signed)
Pt admitted alert and oriented to place with no c/o pain. Pt remains on RA, SpO2 > 95%, lung sounds clear/diminished to auscultation. Pt has remained in NSR on cardiac monitor.  Precedex initiated at starting dose 0.78mcg upon arrival-> stopped about 30 min later d/t low BP. Pt is calm and cooperative sitting in recliner with safety sitter at bedside. Currently replacing K+ and receiving D5NS at 125 ml/hr.

## 2019-11-26 NOTE — Progress Notes (Signed)
Called and gave report to ICU RN. All questions answered. Will tube over any available medications. Will transport momentarily. Jari Favre [facility]

## 2019-11-26 NOTE — Progress Notes (Signed)
CIWA not completed at this time. Patient sleeping. Will re-assess PRN. Lance Green [facility]

## 2019-11-26 NOTE — Progress Notes (Addendum)
PROGRESS NOTE    Lance Green  FYB:[id number] DOB: Sep 26, 1976 DOA: 11/21/2019 PCP: Patient, No Pcp Per    Brief Narrative:  Lance Green is a 44 y.o. male with medical history significant of chronic alcoholism, peptic ulcer disease, alcoholic pancreatitis and neuropathy who presented to the ER with multiple episodes of seizures.  Patient was observed to have seizure in the ER twice    Consultants:   Neurology  Procedures: EEG  Antimicrobials:   None   Subjective: Was told by am nurse pt had unwitnessed fall. Overnight NP was contacted. Pt last night scored 16, this am given ativan, but unsure what the score is as it has not been documented yet, but apparently needed sitter as he is aggitated, and having other withdrawal sx. I saw pt after ativan given, eyes, open, trying to talk to me when asking questions, but drawsy. Opens his eyes when I ask him to, denies sob, cp, or HA.   Objective: Vitals:   11/25/19 1653 11/25/19 2008 11/25/19 2251 11/26/19 0702  BP: (!) 149/87 (!) 163/80 (!) 181/94 (!) 148/102  Pulse: 89 62 63 85  Resp:  18  16  Temp: 97.6 F (36.4 C) 98.2 F (36.8 C) 97.9 F (36.6 C) 98.6 F (37 C)  TempSrc: Oral Oral Oral Oral  SpO2: 100% 100% 91% 100%  Weight:      Height:        Intake/Output Summary (Last 24 hours) at 11/26/2019 0804 Last data filed at 11/25/2019 2008 Gross per 24 hour  Intake 1672.22 ml  Output 1175 ml  Net 497.22 ml   Filed Weights   11/21/19 1558 11/21/19 2316  Weight: 68 kg 55.2 kg    Examination:  General exam: drawsy , opens eyes, trying to be interactive Respiratory system: Clear to auscultation.  With poor respiratory effort anteriorly.  No w/r/r anteriorly Cardiovascular system: S1 & S2 heard, RRR. No murmurs, rubs, gallops or clicks. Gastrointestinal system: Abdomen is nondistended, soft and nontender. Normal bowel sounds heard. Central nervous system: Unable to assess fully, but is trying to follow some  commands like lifting arm, opening eyes.  Extremities no edema no cyanosis Skin: Warm and dry     Data Reviewed: I have personally reviewed following labs and imaging studies  CBC: Recent Labs  Lab 11/21/19 1539 11/22/19 0222 11/23/19 0517 11/24/19 0431  WBC 7.1 9.4 6.8 8.4  NEUTROABS 5.4  --   --   --   HGB 12.6* 12.8* 12.4* 13.5  HCT 35.5* 36.2* 36.1* 36.9*  MCV 90.3 90.0 92.3 87.6  PLT 115* 111* 106* 104*   Basic Metabolic Panel: Recent Labs  Lab 11/21/19 1539 11/22/19 0222 11/23/19 0517 11/24/19 0431 11/25/19 0642  NA 139 135 137 139 138  K 3.7 3.0* 3.5 3.3* 3.1*  CL 98 96* 103 107 106  CO2 25 30 23  21* 23  GLUCOSE 150* 158* 131* 115* 101*  BUN <5* <5* <5* <5* <5*  CREATININE 0.67 0.70 0.68 0.54* 0.57*  CALCIUM 9.3 8.9 8.4* 8.4* 8.0*  MG 1.2* 1.5* 1.7  --   --   PHOS  --  3.2 2.7  --   --    GFR: Estimated Creatinine Clearance: 91 mL/min (A) (by C-G formula based on SCr of 0.57 mg/dL (L)). Liver Function Tests: Recent Labs  Lab 11/21/19 1539 11/22/19 0222 11/23/19 0517 11/23/19 1755 11/25/19 0642  AST 187* 131* 91* 99* 86*  ALT 51* 46* 34 34 34  ALKPHOS 147*  147* 125 122 112  BILITOT 1.1 1.1 0.9 0.9 0.9  PROT 8.2* 7.9 7.1 7.3 6.5  ALBUMIN 4.2 3.8 3.4* 3.4* 3.0*   Recent Labs  Lab 11/21/19 1638  LIPASE 15   No results for input(s): AMMONIA in the last 168 hours. Coagulation Profile: No results for input(s): INR, PROTIME in the last 168 hours. Cardiac Enzymes: No results for input(s): CKTOTAL, CKMB, CKMBINDEX, TROPONINI in the last 168 hours. BNP (last 3 results) No results for input(s): PROBNP in the last 8760 hours. HbA1C: No results for input(s): HGBA1C in the last 72 hours. CBG: No results for input(s): GLUCAP in the last 168 hours. Lipid Profile: No results for input(s): CHOL, HDL, LDLCALC, TRIG, CHOLHDL, LDLDIRECT in the last 72 hours. Thyroid Function Tests: No results for input(s): TSH, T4TOTAL, FREET4, T3FREE, THYROIDAB in the  last 72 hours. Anemia Panel: No results for input(s): VITAMINB12, FOLATE, FERRITIN, TIBC, IRON, RETICCTPCT in the last 72 hours. Sepsis Labs: No results for input(s): PROCALCITON, LATICACIDVEN in the last 168 hours.  Recent Results (from the past 240 hour(s))  SARS CORONAVIRUS 2 (TAT 6-24 HRS) Nasopharyngeal Nasopharyngeal Swab     Status: None   Collection Time: 11/21/19 10:54 PM   Specimen: Nasopharyngeal Swab  Result Value Ref Range Status   SARS Coronavirus 2 NEGATIVE NEGATIVE Final    Comment: (NOTE) SARS-CoV-2 target nucleic acids are NOT DETECTED. The SARS-CoV-2 RNA is generally detectable in upper and lower respiratory specimens during the acute phase of infection. Negative results do not preclude SARS-CoV-2 infection, do not rule out co-infections with other pathogens, and should not be used as the sole basis for treatment or other patient management decisions. Negative results must be combined with clinical observations, patient history, and epidemiological information. The expected result is Negative. Fact Sheet for Patients: SugarRoll.be Fact Sheet for Healthcare Providers: https://www.woods-mathews.com/ This test is not yet approved or cleared by the [place] FDA and  has been authorized for detection and/or diagnosis of SARS-CoV-2 by FDA under an Emergency Use Authorization (EUA). This EUA will remain  in effect (meaning this test can be used) for the duration of the COVID-19 declaration under Section 56 4(b)(1) of the Act, 21 U.S.C. section 360bbb-3(b)(1), unless the authorization is terminated or revoked sooner. Performed at [facility], [place] [address], [place], [place] [address]          Radiology Studies: No results found.      Scheduled Meds: . amLODipine  2.5 mg Oral Daily  . folic acid  1 mg Oral Daily  . levETIRAcetam  500 mg Oral BID  . LORazepam  0-4 mg Intravenous Q6H   Followed by  .  [START ON 11/27/2019] LORazepam  0-4 mg Intravenous Q12H  . multivitamin with minerals  1 tablet Oral Daily  . neomycin-polymyxin b-dexamethasone  1 drop Both Eyes 4x daily  . thiamine  100 mg Oral Daily   Or  . thiamine  100 mg Intravenous Daily   Continuous Infusions: . dextrose 5 % and 0.9% NaCl 125 mL/hr at 11/25/19 1710    Assessment & Plan:   Principal Problem:   Seizure [facility]) Active Problems:   Alcohol abuse   Hypomagnesemia   Seizures:likely secondary to alcohol withdrawal seizures. Continue on thiamine.  CT without acute changes.  Patient loaded and maintained on Keppra.   Neurology following.   MRI of brain pending.   EEG without any epileptiform activity.   Per neurology if MRI of the brain is unremarkable  would start taper of Keppra, Keppra decreased from 500 mg twice daily to 250 twice daily Seizure precaution Patient unable to drive, operate heavy machinery, perform activities at heights and participate in water activities until release by outpatient physician.   Alcohol abuse: pt reported stopping alcohol use altogether.  Continue on CIWA protocol as he is continuing to scoring high.   Continue on thiamine and folic acid. At this time we will transfer to ICU for Precedex initiation  ICU attending Dr. Jayme Cloud has been consulted   Transaminitis: secondary to alcohol abuse, improving. Will continue to monitor  Elevated alkaline phosphatase: likely secondary to alcohol abuse, improving. Will continue to monitor   Hypomagnesemia:Still low at 1.5, will give dose 2 g IV x1 today monitor levels continue to monitor  Hypokalemia: 2.8 today, will give 40 mEq IV twice daily x1 day and repeat level   Thrombocytopenia: likely secondary to alcohol abuse.  But has decreased mildly.  Will DC heparin subcu for now Monitor level  Headaches:likely secondary to fall and seizure. He has soft tissue contusion in the left temporal area where he is having the pain.  Tylenol prn for pain     DVT prophylaxis: scd Code Status: Full Family Communication: called Aunt but no answer Disposition Plan: Still scoring high on alcohol withdrawal protocol.  We will transfer to stepdown unit for Precedex to be initiated.    LOS: 5 days   Time spent: 45 minutes with more than 50% on COC    Lynn Ito, MD [facility] Pager [phone]  If 7PM-7AM, please contact night-coverage [email or web address] Password [facility] 11/26/2019, 8:04 AM Patient ID: Lance Green, male   DOB: 10-06-1976, 44 y.o.   MRN: [id number]

## 2019-11-26 NOTE — Progress Notes (Signed)
Subjective: Patient remains confused.  No further seizure activity noted.  Objective: Current vital signs: BP (!) 141/98 (BP Location: Left Arm)   Pulse 80   Temp 97.6 F (36.4 C) (Oral)   Resp 18   Ht 5\' 7"  (1.702 m)   Wt 55.2 kg   SpO2 100%   BMI 19.08 kg/m  Vital signs in last 24 hours: Temp:  [97.6 F (36.4 C)-98.6 F (37 C)] 97.6 F (36.4 C) (02/20 0856) Pulse Rate:  [62-89] 80 (02/20 0856) Resp:  [16-18] 18 (02/20 0856) BP: (137-181)/(80-102) 141/98 (02/20 0856) SpO2:  [91 %-100 %] 100 % (02/20 0856)  Intake/Output from previous day: 02/19 0701 - 02/20 0700 In: 1672.2 [I.V.:1634.4; IV Piggyback:37.9] Out: 1175 [Urine:1175] Intake/Output this shift: No intake/output data recorded. Nutritional status:  Diet Order            Diet regular Room service appropriate? Yes; Fluid consistency: Thin  Diet effective now              Neurologic Exam: Mental Status: Awake and alert.  Speech garbled but fluent.  Follows commands.  Cranial Nerves: 3/20 to bilateral confrontation, pupils equal, round, reactive to light and accommodation III,IV, VI: ptosis not present, extra-ocular motions intact bilaterally V,VII: smile symmetric, facial light touch sensation normal bilaterally VIII: hearing normal bilaterally IX,X: gag reflex present XI: bilateral shoulder shrug XII: midline tongue extension Motor: Lifts all extremities weakly against gravity Sensory: Pinprick and light touch intact throughout, bilaterally   Lab Results: Basic Metabolic Panel: Recent Labs  Lab 11/21/19 1539 11/21/19 1539 11/22/19 0222 11/22/19 0222 11/23/19 0517 11/24/19 0431 11/25/19 0642  NA 139  --  135  --  137 139 138  K 3.7  --  3.0*  --  3.5 3.3* 3.1*  CL 98  --  96*  --  103 107 106  CO2 25  --  30  --  23 21* 23  GLUCOSE 150*  --  158*  --  131* 115* 101*  BUN <5*  --  <5*  --  <5* <5* <5*  CREATININE 0.67  --  0.70  --  0.68 0.54* 0.57*  CALCIUM 9.3   < > 8.9   < >  8.4* 8.4* 8.0*  MG 1.2*  --  1.5*  --  1.7  --   --   PHOS  --   --  3.2  --  2.7  --   --    < > = values in this interval not displayed.    Liver Function Tests: Recent Labs  Lab 11/21/19 1539 11/22/19 0222 11/23/19 0517 11/23/19 1755 11/25/19 0642  AST 187* 131* 91* 99* 86*  ALT 51* 46* 34 34 34  ALKPHOS 147* 147* 125 122 112  BILITOT 1.1 1.1 0.9 0.9 0.9  PROT 8.2* 7.9 7.1 7.3 6.5  ALBUMIN 4.2 3.8 3.4* 3.4* 3.0*   Recent Labs  Lab 11/21/19 1638  LIPASE 15   No results for input(s): AMMONIA in the last 168 hours.  CBC: Recent Labs  Lab 11/21/19 1539 11/22/19 0222 11/23/19 0517 11/24/19 0431  WBC 7.1 9.4 6.8 8.4  NEUTROABS 5.4  --   --   --   HGB 12.6* 12.8* 12.4* 13.5  HCT 35.5* 36.2* 36.1* 36.9*  MCV 90.3 90.0 92.3 87.6  PLT 115* 111* 106* 104*    Cardiac Enzymes: No results for input(s): CKTOTAL, CKMB, CKMBINDEX, TROPONINI in the last 168 hours.  Lipid Panel: No results for input(s): CHOL,  TRIG, HDL, CHOLHDL, VLDL, LDLCALC in the last 168 hours.  CBG: No results for input(s): GLUCAP in the last 168 hours.  Microbiology: Results for orders placed or performed during the hospital encounter of 11/21/19  SARS CORONAVIRUS 2 (TAT 6-24 HRS) Nasopharyngeal Nasopharyngeal Swab     Status: None   Collection Time: 11/21/19 10:54 PM   Specimen: Nasopharyngeal Swab  Result Value Ref Range Status   SARS Coronavirus 2 NEGATIVE NEGATIVE Final    Comment: (NOTE) SARS-CoV-2 target nucleic acids are NOT DETECTED. The SARS-CoV-2 RNA is generally detectable in upper and lower respiratory specimens during the acute phase of infection. Negative results do not preclude SARS-CoV-2 infection, do not rule out co-infections with other pathogens, and should not be used as the sole basis for treatment or other patient management decisions. Negative results must be combined with clinical observations, patient history, and epidemiological information. The expected result is  Negative. Fact Sheet for Patients: SugarRoll.be Fact Sheet for Healthcare Providers: https://www.woods-mathews.com/ This test is not yet approved or cleared by the [place] FDA and  has been authorized for detection and/or diagnosis of SARS-CoV-2 by FDA under an Emergency Use Authorization (EUA). This EUA will remain  in effect (meaning this test can be used) for the duration of the COVID-19 declaration under Section 56 4(b)(1) of the Act, 21 U.S.C. section 360bbb-3(b)(1), unless the authorization is terminated or revoked sooner. Performed at [facility], [place] [address], [place], [place] [address]     Coagulation Studies: No results for input(s): LABPROT, INR in the last 72 hours.  Imaging: No results found.  Medications:  I have reviewed the patient's current medications. Scheduled: . amLODipine  2.5 mg Oral Daily  . folic acid  1 mg Oral Daily  . levETIRAcetam  500 mg Oral BID  . LORazepam  0-4 mg Intravenous Q6H   Followed by  . [START ON 11/27/2019] LORazepam  0-4 mg Intravenous Q12H  . multivitamin with minerals  1 tablet Oral Daily  . neomycin-polymyxin b-dexamethasone  1 drop Both Eyes 4x daily  . thiamine  100 mg Oral Daily   Or  . thiamine  100 mg Intravenous Daily    Assessment/Plan: 44 year old male with a history of ETOH abuse, pancreatitis and PUD who presents with what appears to be ETOH withdrawal seizures. Patient without a previous history of seizures. Currently requiring intervention per CIWA protocol. Head CT personally reviewed and shows no acute changes. EEG shows no epileptiform activity. MRI of the brain with movement artifact but shows no evidence of acute abnormalities.  Patient on Keppra decreased to 500mg  BID  Recommendations: 1. Decrease Keppra to 250mg  BID   LOS: 5 days   Alexis Goodell, MD Neurology [phone] 11/26/2019  9:33 AM

## 2019-11-27 ENCOUNTER — Inpatient Hospital Stay: Admit: 2019-11-27 | Payer: Self-pay

## 2019-11-27 LAB — RENAL FUNCTION PANEL
Albumin: 3 g/dL — ABNORMAL LOW (ref 3.5–5.0)
Anion gap: 7 (ref 5–15)
BUN: 7 mg/dL (ref 6–20)
CO2: 27 mmol/L (ref 22–32)
Calcium: 8.7 mg/dL — ABNORMAL LOW (ref 8.9–10.3)
Chloride: 107 mmol/L (ref 98–111)
Creatinine, Ser: 0.64 mg/dL (ref 0.61–1.24)
GFR calc Af Amer: >60 mL/min (ref >60)
GFR calc non Af Amer: >60 mL/min (ref >60)
Glucose, Bld: 111 mg/dL — ABNORMAL HIGH (ref 70–99)
Phosphorus: 3.7 mg/dL (ref 2.5–4.6)
Potassium: 3 mmol/L — ABNORMAL LOW (ref 3.5–5.1)
Sodium: 141 mmol/L (ref 135–145)

## 2019-11-27 LAB — GLUCOSE, CAPILLARY
Glucose-Capillary: 110 mg/dL — ABNORMAL HIGH (ref 70–99)
Glucose-Capillary: 107 mg/dL — ABNORMAL HIGH (ref 70–99)
Glucose-Capillary: 122 mg/dL — ABNORMAL HIGH (ref 70–99)
Glucose-Capillary: 186 mg/dL — ABNORMAL HIGH (ref 70–99)

## 2019-11-27 LAB — MAGNESIUM: Magnesium: 1.8 mg/dL (ref 1.7–2.4)

## 2019-11-27 MED ORDER — POTASSIUM CHLORIDE 10 MEQ/100ML IV SOLN
10.0000 meq | INTRAVENOUS | Status: AC
  Administered 2019-11-27 (×4): 10 meq via INTRAVENOUS
  Filled 2019-11-27 (×4): qty 100

## 2019-11-27 MED ORDER — CLONIDINE HCL 0.1 MG PO TABS
0.1000 mg | ORAL_TABLET | Freq: Two times a day (BID) | ORAL | Status: DC
  Administered 2019-11-27 – 2019-11-28 (×3): 0.1 mg via ORAL
  Filled 2019-11-27 (×3): qty 1

## 2019-11-27 MED ORDER — POTASSIUM CHLORIDE CRYS ER 20 MEQ PO TBCR
40.0000 meq | EXTENDED_RELEASE_TABLET | Freq: Once | ORAL | Status: AC
  Administered 2019-11-27: 40 meq via ORAL
  Filled 2019-11-27: qty 2

## 2019-11-27 MED ORDER — ENSURE ENLIVE PO LIQD
237.0000 mL | Freq: Two times a day (BID) | ORAL | Status: DC
  Administered 2019-11-27: 237 mL via ORAL

## 2019-11-27 NOTE — Progress Notes (Signed)
Patient ID: Lance Green, male   DOB: 22-Dec-1975, 44 y.o.   MRN: [id number]  PROGRESS NOTE    Lance Green  WPY:[id number] DOB: Dec 20, 1975 DOA: 11/21/2019 PCP: Patient, No Pcp Per    Brief Narrative:  Lance Green is a 44 y.o. male with medical history significant of chronic alcoholism, peptic ulcer disease, alcoholic pancreatitis and neuropathy who presented to the ER with multiple episodes of seizures.  Patient was observed to have seizure in the ER twice    Consultants:   Neurology  Procedures: EEG  Antimicrobials:   None   Subjective: Did not score last night or this morning.  Ativan not given during NOC shift.  Seen and examined in stepdown unit this AM.  He is communicative and interactive.  States feels better.  Denies any shortness of breath, dizziness, chest pain or any other symptom.  Asked me if he can go home I did tell him no until his little bit more stable and he is in agreement.  Objective: Vitals:   11/27/19 0900 11/27/19 1010 11/27/19 1030 11/27/19 1130  BP:  (!) 156/94 (!) 159/99 (!) 158/92  Pulse: 72 80 77 65  Resp: 14 17 16 13   Temp:   98.9 F (37.2 C) 98.9 F (37.2 C)  TempSrc:   Oral Oral  SpO2: 100% 100% 100% 100%  Weight:      Height:        Intake/Output Summary (Last 24 hours) at 11/27/2019 1212 Last data filed at 11/27/2019 1144 Gross per 24 hour  Intake 1395.35 ml  Output 1050 ml  Net 345.35 ml   Filed Weights   11/21/19 1558 11/21/19 2316  Weight: 68 kg 55.2 kg    Examination:  General exam: drawsy , opens eyes, trying to be interactive Respiratory system: Clear to auscultation.  With poor respiratory effort anteriorly.  No w/r/r anteriorly Cardiovascular system: S1 & S2 heard, RRR. No murmurs, rubs, gallops or clicks. Gastrointestinal system: Abdomen is nondistended, soft and nontender. Normal bowel sounds heard. Central nervous system: Unable to assess fully, but is trying to follow some commands like lifting arm,  opening eyes.  Extremities no edema no cyanosis Skin: Warm and dry     Data Reviewed: I have personally reviewed following labs and imaging studies  CBC: Recent Labs  Lab 11/21/19 1539 11/22/19 0222 11/23/19 0517 11/24/19 0431  WBC 7.1 9.4 6.8 8.4  NEUTROABS 5.4  --   --   --   HGB 12.6* 12.8* 12.4* 13.5  HCT 35.5* 36.2* 36.1* 36.9*  MCV 90.3 90.0 92.3 87.6  PLT 115* 111* 106* 104*   Basic Metabolic Panel: Recent Labs  Lab 11/21/19 1539 11/21/19 1539 11/22/19 0222 11/22/19 0222 11/23/19 0517 11/24/19 0431 11/25/19 0642 11/26/19 1056 11/27/19 0431  NA 139   < > 135  --  137 139 138  --  141  K 3.7   < > 3.0*   < > 3.5 3.3* 3.1* 2.8* 3.0*  CL 98   < > 96*  --  103 107 106  --  107  CO2 25   < > 30  --  23 21* 23  --  27  GLUCOSE 150*   < > 158*  --  131* 115* 101*  --  111*  BUN <5*   < > <5*  --  <5* <5* <5*  --  7  CREATININE 0.67   < > 0.70  --  0.68 0.54* 0.57*  --  0.64  CALCIUM 9.3   < > 8.9  --  8.4* 8.4* 8.0*  --  8.7*  MG 1.2*  --  1.5*  --  1.7  --   --  1.5* 1.8  PHOS  --   --  3.2  --  2.7  --   --   --  3.7   < > = values in this interval not displayed.   GFR: Estimated Creatinine Clearance: 91 mL/min (by C-G formula based on SCr of 0.64 mg/dL). Liver Function Tests: Recent Labs  Lab 11/21/19 1539 11/21/19 1539 11/22/19 0222 11/23/19 0517 11/23/19 1755 11/25/19 0642 11/27/19 0431  AST 187*  --  131* 91* 99* 86*  --   ALT 51*  --  46* 34 34 34  --   ALKPHOS 147*  --  147* 125 122 112  --   BILITOT 1.1  --  1.1 0.9 0.9 0.9  --   PROT 8.2*  --  7.9 7.1 7.3 6.5  --   ALBUMIN 4.2   < > 3.8 3.4* 3.4* 3.0* 3.0*   < > = values in this interval not displayed.   Recent Labs  Lab 11/21/19 1638  LIPASE 15   No results for input(s): AMMONIA in the last 168 hours. Coagulation Profile: No results for input(s): INR, PROTIME in the last 168 hours. Cardiac Enzymes: No results for input(s): CKTOTAL, CKMB, CKMBINDEX, TROPONINI in the last 168  hours. BNP (last 3 results) No results for input(s): PROBNP in the last 8760 hours. HbA1C: No results for input(s): HGBA1C in the last 72 hours. CBG: Recent Labs  Lab 11/26/19 1207 11/26/19 1258 11/26/19 2306 11/27/19 0738 11/27/19 1142  GLUCAP 81 89 112* 110* 107*   Lipid Profile: No results for input(s): CHOL, HDL, LDLCALC, TRIG, CHOLHDL, LDLDIRECT in the last 72 hours. Thyroid Function Tests: No results for input(s): TSH, T4TOTAL, FREET4, T3FREE, THYROIDAB in the last 72 hours. Anemia Panel: No results for input(s): VITAMINB12, FOLATE, FERRITIN, TIBC, IRON, RETICCTPCT in the last 72 hours. Sepsis Labs: No results for input(s): PROCALCITON, LATICACIDVEN in the last 168 hours.  Recent Results (from the past 240 hour(s))  SARS CORONAVIRUS 2 (TAT 6-24 HRS) Nasopharyngeal Nasopharyngeal Swab     Status: None   Collection Time: 11/21/19 10:54 PM   Specimen: Nasopharyngeal Swab  Result Value Ref Range Status   SARS Coronavirus 2 NEGATIVE NEGATIVE Final    Comment: (NOTE) SARS-CoV-2 target nucleic acids are NOT DETECTED. The SARS-CoV-2 RNA is generally detectable in upper and lower respiratory specimens during the acute phase of infection. Negative results do not preclude SARS-CoV-2 infection, do not rule out co-infections with other pathogens, and should not be used as the sole basis for treatment or other patient management decisions. Negative results must be combined with clinical observations, patient history, and epidemiological information. The expected result is Negative. Fact Sheet for Patients: HairSlick.no Fact Sheet for Healthcare Providers: quierodirigir.com This test is not yet approved or cleared by the [place] FDA and  has been authorized for detection and/or diagnosis of SARS-CoV-2 by FDA under an Emergency Use Authorization (EUA). This EUA will remain  in effect (meaning this test can be used) for  the duration of the COVID-19 declaration under Section 56 4(b)(1) of the Act, 21 U.S.C. section 360bbb-3(b)(1), unless the authorization is terminated or revoked sooner. Performed at [facility], [address], [place], [place] [address]   MRSA PCR Screening     Status: None  Collection Time: 11/26/19  2:10 PM   Specimen: Nasal Mucosa; Nasopharyngeal  Result Value Ref Range Status   MRSA by PCR NEGATIVE NEGATIVE Final    Comment:        The GeneXpert MRSA Assay (FDA approved for NASAL specimens only), is one component of a comprehensive MRSA colonization surveillance program. It is not intended to diagnose MRSA infection nor to guide or monitor treatment for MRSA infections. Performed at [facility], [address] [place], [place] [address]          Radiology Studies: [place] EKG SITE RITE  Result Date: 11/26/2019 If [facility] image not attached, placement could not be confirmed due to current cardiac rhythm.       Scheduled Meds: . amLODipine  2.5 mg Oral Daily  . Chlorhexidine Gluconate Cloth  6 each Topical Daily  . folic acid  1 mg Oral Daily  . levETIRAcetam  250 mg Oral BID  . LORazepam  0-4 mg Intravenous Q12H  . multivitamin with minerals  1 tablet Oral Daily  . neomycin-polymyxin b-dexamethasone  1 drop Both Eyes 4x daily   Continuous Infusions: . dextrose 5% lactated ringers 100 mL/hr at 11/27/19 0248  . potassium chloride 10 mEq (11/27/19 1138)  . thiamine injection 500 mg (11/27/19 0823)    Assessment & Plan:   Principal Problem:   Seizure (HCC) Active Problems:   Alcohol abuse   Hypomagnesemia   Seizures:likely secondary to alcohol withdrawal seizures. Continue on thiamine.  CT without acute changes.  Patient loaded and maintained on Keppra.   Neurology following.   MRI of brain pending.   EEG without any epileptiform activity.   Per neurology if MRI of the brain is unremarkable would start taper of Keppra, Keppra  decreased from 500 mg twice daily to 250 twice daily Seizure precaution Patient unable to drive, operate heavy machinery, perform activities at heights and participate in water activities until release by outpatient physician.   Alcohol abuse/Withdrawl: pt reported stopping alcohol use altogether.  Continue on CIWA protocol as he is continuing to scoring high.   Continue on thiamine and folic acid. 12/20- due to high CIWA score yesterday and aggitation, pt was transferred to SDU to start on Precedex, but did not tolerate due to low blood pressure issues.  It was discontinued.  He was started on high-dose thiamine IV and IV fluids. He has not scored last night or this am.  Hemodynamically stable  Plan: Transfer to Tele PT/OT Continue current mx as above   Transaminitis: secondary to alcohol abuse, improving. Will continue to monitor  Elevated alkaline phosphatase: likely secondary to alcohol abuse, improving. Will continue to monitor   Hypomagnesemia:Still low at 1.5, will give dose 2 g IV x1 today monitor levels continue to monitor  Hypokalemia: 3.0 today Will replace and ck follow K level  Thrombocytopenia: likely secondary to alcohol abuse.  But has decreased mildly.  Will DC heparin subcu for now Monitor level  Headaches:likely secondary to fall and seizure. He has soft tissue contusion in the left temporal area where he is having the pain. Tylenol prn for pain     DVT prophylaxis: scd Code Status: Full Family Communication: none at bedside Disposition Plan: Transfer to tele floor, continue to monitor him. PT/OT   LOS: 6 days   Time spent: 45 minutes with more than 50% on COC    Lynn Ito, MD [facility] Pager [phone]  If 7PM-7AM, please contact night-coverage [email or web address] Password [email or web address] 11/27/2019,  12:12 PM Patient ID: Lance Green, male   DOB: [birth year], 44 y.o.   MRN: [id number]

## 2019-11-27 NOTE — Care Plan (Signed)
After further assessment and discussion of patient's  status and medical condition after admission to the ICU and coordinating with bedside nurse recommendations are as follows:  Main issue: Alcoholism with alcohol withdrawal seizures  Patient was initially placed on Precedex, did not tolerate due to blood pressure issues No agitation noted, sitter notes that the patient is easily redirected No further seizures. Metabolic abnormalities identified Consult pharmacy for assistance with electrolyte management IV fluids adjusted and switched to D5 LR High-dose thiamine (Warnicke's encephalopathy doses) No hemodynamic instability noted Patient currently not agitated.   PCCM will remain available if needed however, will not formally follow.  Gailen Shelter, MD [facility]

## 2019-11-27 NOTE — Evaluation (Signed)
Physical Therapy Evaluation Patient Details Name: Lance Green MRN: [id number] DOB: 1976-03-10 Today's Date: 11/27/2019   History of Present Illness  Patient is a 44 y.o. male with medical history significant of chronic alcoholism, peptic ulcer disease, alcoholic pancreatitis and neuropathy who presented to the ER with multiple episodes of seizures.  Patient was observed to have seizure in the ER twice. Per documentation patient had one fall during hospitalization and required use of sitter. Transferred to ICU for Precedex initiation, however unable to tolerate tx and stopped after 1 hour.  Clinical Impression  Patient is a pleasant 44 year old male who presents with balance deficits and weakness (RLE>LLE). Patient resides in boarding/group home prior to admission and was indep with mobility without AD. Throughout hospitalization patient has been episodic with confusion however upon PT attempt patient is alert and oriented with nursing confirming patient's cognitive status since her shift began. Patient is eager to participate with PT and is impulsive with poor safety awareness. He requires frequent "first, then.." cueing for safety. Monitoring of vitals throughout session performed with frequent rest breaks and education on purse lipped breathing for reduction of vitals to therapeutic range. Occasional runs of "vtach" appear on screen, nursing notified and reports due to movement not vitals.  Patient performed safe bed mobility with HOB elevated. Transfers however require close CGA/min A with first STS resulting in posteriolateral (to the R) LOB with Mod A from PT to remain upright. Use of AD then implemented with max cueing for hand placement and use of RW. Patient requires use of BUE support in standing for mobility due to posterior trunk lean and decreased stability on RLE. Upon room mobility patient demonstrated deficits of RLE with decreased tolerance for weightbearing and stability in stance  phase. Patient returned to bed to perform seated and supine interventions. Nursing notified of patient's performance and need for RW for transfers. While patient is hospitalized he will benefit from skilled physical therapy to increase stability, strength, and mobility for return to PLOF. Upon discharge patient will benefit from HHPT and intermittent supervision to reduce risk of falls and to improve quality of life.     Follow Up Recommendations Home health PT;Supervision - Intermittent    Equipment Recommendations  Rolling walker with 5" wheels;3in1 (PT)    Recommendations for Other Services       Precautions / Restrictions Precautions Precautions: Fall Restrictions Weight Bearing Restrictions: No      Mobility  Bed Mobility Overal bed mobility: Modified Independent             General bed mobility comments: able to transition EOB sitting <>supine with HOB elevated with supervision.  Transfers Overall transfer level: Needs assistance Equipment used: Rolling walker (2 wheeled) Transfers: Sit to/from Stand Sit to Stand: Min assist;Min guard;From elevated surface         General transfer comment: Patient performed STS with and without RW however first attempt resulted in posteriolateral (R) LOB requiring mod A to return to COM. Consecutive attempts were safer with decreased need for assistance to maintain upright position.  Ambulation/Gait Ambulation/Gait assistance: Min assist;Min guard Gait Distance (Feet): 45 Feet Assistive device: Rolling walker (2 wheeled) Gait Pattern/deviations: Decreased weight shift to right;Drifts right/left;Decreased stance time - right;Decreased stride length;Narrow base of support;Leaning posteriorly Gait velocity: decreased   General Gait Details: patient requires heavy cueing/education on proper use of AD, not safe to ambulate without walker at this time due to heavy posterior trunk lean and instability on RLE.  Stairs  Wheelchair Mobility    Modified Rankin (Stroke Patients Only)       Balance Overall balance assessment: Needs assistance Sitting-balance support: Feet supported Sitting balance-Leahy Scale: Fair Sitting balance - Comments: reaches inside/outsideBOS without LOB, difficulty with marching LE's Postural control: Posterior lean Standing balance support: Bilateral upper extremity supported;During functional activity Standing balance-Leahy Scale: Poor Standing balance comment: Patient requires use of BUE support in standing for mobility due to posterior trunk lean and decreased stability on RLE.                             Pertinent Vitals/Pain Pain Assessment: Faces Faces Pain Scale: Hurts a little bit Pain Location: L forearm Pain Descriptors / Indicators: Guarding Pain Intervention(s): Limited activity within patient's tolerance;Monitored during session;Repositioned    Home Living                        Prior Function                 Hand Dominance        Extremity/Trunk Assessment   Upper Extremity Assessment Upper Extremity Assessment: Defer to OT evaluation    Lower Extremity Assessment Lower Extremity Assessment: RLE deficits/detail;LLE deficits/detail RLE Deficits / Details: grossly 3+/5 RLE Sensation: history of peripheral neuropathy RLE Coordination: decreased gross motor LLE Deficits / Details: grossly 4/5 LLE Sensation: history of peripheral neuropathy LLE Coordination: WNL       Communication      Cognition Arousal/Alertness: Awake/alert Behavior During Therapy: WFL for tasks assessed/performed;Restless;Impulsive Overall Cognitive Status: Within Functional Limits for tasks assessed                                 General Comments: Patient is A and O x 4, per nursing has been since she started her shift as well. Is very restless/implusive requiring frequent "first, then" cueing for safety.      General  Comments General comments (skin integrity, edema, etc.): noted skin tears on shins,    Exercises General Exercises - Lower Extremity Ankle Circles/Pumps: Strengthening;AROM;Both;5 reps;Supine Long Arc Quad: Strengthening;Both;10 reps;Seated Heel Slides: Strengthening;10 reps;Both;Supine Hip Flexion/Marching: Strengthening;Both;10 reps;Standing Other Exercises Other Exercises: Patient educated on safe mobility and transfers with use of RW, requires heavy cueing for sequencing and proper use of AD for stability and safety.   Assessment/Plan    PT Assessment Patient needs continued PT services  PT Problem List Decreased strength;Decreased activity tolerance;Decreased balance;Decreased knowledge of use of DME;Decreased mobility;Decreased safety awareness;Decreased knowledge of precautions;Cardiopulmonary status limiting activity;Impaired sensation       PT Treatment Interventions DME instruction;Gait training;Stair training;Functional mobility training;Neuromuscular re-education;Balance training;Therapeutic exercise;Therapeutic activities;Cognitive remediation;Patient/family education    PT Goals (Current goals can be found in the Care Plan section)  Acute Rehab PT Goals Patient Stated Goal: to get back to "normal" and be able to walk PT Goal Formulation: With patient Time For Goal Achievement: 12/11/19 Potential to Achieve Goals: Fair    Frequency Min 2X/week   Barriers to discharge Decreased caregiver support patient will require assistance with mobility and iADLs    Co-evaluation               AM-PAC PT "6 Clicks" Mobility  Outcome Measure Help needed turning from your back to your side while in a flat bed without using bedrails?: None Help needed moving from lying on your back  to sitting on the side of a flat bed without using bedrails?: A Little Help needed moving to and from a bed to a chair (including a wheelchair)?: A Little Help needed standing up from a chair using  your arms (e.g., wheelchair or bedside chair)?: A Little Help needed to walk in hospital room?: A Lot Help needed climbing 3-5 steps with a railing? : A Lot 6 Click Score: 17    End of Session Equipment Utilized During Treatment: Gait belt Activity Tolerance: Patient tolerated treatment well;Patient limited by fatigue Patient left: in bed;with call bell/phone within reach;with bed alarm set Nurse Communication: Mobility status;Other (comment)(patient tolerance and instability) PT Visit Diagnosis: Unsteadiness on feet (R26.81);Other abnormalities of gait and mobility (R26.89);Muscle weakness (generalized) (M62.81);Difficulty in walking, not elsewhere classified (R26.2)    Time: 2956-2130 PT Time Calculation (min) (ACUTE ONLY): 27 min   Charges:   PT Evaluation $PT Eval Moderate Complexity: 1 Mod PT Treatments $Therapeutic Exercise: 8-22 mins        Janna Arch, PT, DPT   11/27/2019, 1:39 PM

## 2019-11-27 NOTE — Progress Notes (Signed)
Spoke to MD Dr Marcos Eke in regards to BP which is steadily increasing. MD states to wait on hydralazine IV and give clonidine as it will help with potential detox process. Awaiting verification by pharmacy and will provide to patient as soon as available.

## 2019-11-27 NOTE — Progress Notes (Signed)
Destination Bed not clean for patient transfer according to nurse Angelique Blonder, who took report for patient.

## 2019-11-27 NOTE — Progress Notes (Signed)
Attempted to provide transfer report to Floor RN. RN unavailable, will attempt to provide repost again in 30 minutes if no response.

## 2019-11-27 NOTE — Progress Notes (Signed)
Pt VSS. AOX4. CIWA score zero. Ativan not given during NOC shift. Pt interactive and appropriate. Asking to be DCd to home. No sitter at bedside.Pt agrees to call if he wants to get OOB.

## 2019-11-27 NOTE — Care Plan (Addendum)
No issues overnight.  No agitation.  Easily redirected.  Blood pressure is elevated.  We will add low-dose clonidine.  This will also help somewhat withdrawal.  Agree with transfer to floor today.  Gailen Shelter, MD [facility]

## 2019-11-28 ENCOUNTER — Inpatient Hospital Stay (HOSPITAL_COMMUNITY)
Admit: 2019-11-28 | Discharge: 2019-11-28 | Disposition: A | Discharge status: Home / Self Care | Payer: Self-pay | Attending: Internal Medicine | Admitting: Internal Medicine

## 2019-11-28 DIAGNOSIS — R9431 Abnormal electrocardiogram [ECG] [EKG]: Secondary | ICD-10-CM

## 2019-11-28 LAB — CBC
HCT: 28.5 % — ABNORMAL LOW (ref 39.0–52.0)
Hemoglobin: 9.9 g/dL — ABNORMAL LOW (ref 13.0–17.0)
MCH: 31.9 pg (ref 26.0–34.0)
MCHC: 34.7 g/dL (ref 30.0–36.0)
MCV: 91.9 fL (ref 80.0–100.0)
Platelets: 150 10*3/uL (ref 150–400)
RBC: 3.1 MIL/uL — ABNORMAL LOW (ref 4.22–5.81)
RDW: 12.3 % (ref 11.5–15.5)
WBC: 6.5 10*3/uL (ref 4.0–10.5)
nRBC: 0 % (ref 0.0–0.2)

## 2019-11-28 LAB — ECHOCARDIOGRAM COMPLETE
Height: 67 in
Weight: 1948.8 oz

## 2019-11-28 LAB — GLUCOSE, CAPILLARY: Glucose-Capillary: 113 mg/dL — ABNORMAL HIGH (ref 70–99)

## 2019-11-28 LAB — POTASSIUM: Potassium: 3.6 mmol/L (ref 3.5–5.1)

## 2019-11-28 MED ORDER — FOLIC ACID 1 MG PO TABS: 1.0000 mg | ORAL_TABLET | Freq: Every day | ORAL | 0 refills | Status: DC

## 2019-11-28 MED ORDER — ADULT MULTIVITAMIN W/MINERALS CH: 1.0000 | ORAL_TABLET | Freq: Every day | ORAL | 0 refills | Status: DC

## 2019-11-28 MED ORDER — THIAMINE HCL 100 MG PO TABS: 100.0000 mg | ORAL_TABLET | Freq: Every day | ORAL | 0 refills | Status: DC

## 2019-11-28 MED ORDER — LEVETIRACETAM 250 MG PO TABS: 250.0000 mg | ORAL_TABLET | Freq: Two times a day (BID) | ORAL | 0 refills | Status: DC

## 2019-11-28 MED ORDER — AMLODIPINE BESYLATE 2.5 MG PO TABS: 2.5000 mg | ORAL_TABLET | Freq: Every day | ORAL | 0 refills | Status: DC

## 2019-11-28 NOTE — Progress Notes (Signed)
OT Cancellation Note  Patient Details Name: CLEOFAS HUDGINS MRN: [id number] DOB: 03/05/1976   Cancelled Treatment:    Reason Eval/Treat Not Completed: OT screened, no needs identified, will sign off Order received for OT evaluation and chart reviewed. Pt had just finished with PT and was in the bathroom with his IV pole.  He has progressed to completing all self care skills independently and presents with impulsivity but seen for OT screening only with no OT needs identified.  Please re-consult if any further issues or needs arise.  Susanne Borders, OTR/L, [facility] ascom [phone] 11/28/19, 11:51 AM

## 2019-11-28 NOTE — Discharge Summary (Signed)
Lance Green:[id number] DOB: Nov 17, 1975 DOA: 11/21/2019  PCP: Patient, No Pcp Per  Admit date: 11/21/2019 Discharge date: 11/28/2019  Admitted From: home Disposition:  home  Recommendations for Outpatient Follow-up:  1. Follow up with PCP in 1 week 2. Please obtain BMP/CBC in one week 3. Follow up with Dr. Malvin Johns , neurology in one week  4. Follow up with resources you have for quitting ETOH abuse  Home Health:social work and PT   Discharge Condition:Stable CODE STATUS:full code  Diet recommendation: Heart Healthy Brief/Interim Summary: Lance Green is a 44 y.o. male with medical history significant of chronic alcoholism, peptic ulcer disease, alcoholic pancreatitis and neuropathy who presented to the ER with multiple episodes of seizures.  Patient was observed to have seizure in the ER twice.  No prior history of seizure disorder.  He apparently stopped drinking abruptly 2 days prior to admission.  He did not drink  Patient has been a heavy drinker prior to that.  Admitted to the hospitalist service.  He was loaded on Keppra.  Neurology was consulted.  He was placed on alcohol withdrawal protocol which he was scoring high.  He had a CT of the cervical spine and head that revealed soft tissue contusion about the left temporal fossa otherwise no acute intracranial abnormality with full results stated below.  He had an MRI of the brain revealing left lateral scalp hematoma and no acute intracranial abnormality please see results below.  An echocardiogram was obtained revealing EF of 60 to 65% and no significant valvular disease.  Urine toxicology admission was negative.  His HIV screen fourth-generation was nonreactive and SARS Covid test was negative.  During his hospitalization he was scoring high and very agitated, he was then was transferred to the stepdown unit for Precedex drip he did not tolerated for very much due to low blood pressure.  It was discontinued.  He stopped scoring  since 2/21.  No scoring last night or this AM.  He had physical therapy and Occupational Therapy.  They recommended home health.  He is also set up for social work visit.  From his neurology standpoint he was started on Keppra 500 mg twice daily and then decrease to 250 mg twice daily.  He will need to follow-up with neurology in 1 week as outpatient.  He was told that he is unable to drive, operate heavy machinery, perform activities at heights and participate in water activities until released by outpatient physician.  He did have EEG completed by neurology that did not reveal any epileptiform activity.  Today he is stable to be discharged home.  His transaminitis was secondary to alcohol abuse and they have improved.  His electrolytes was also replaced and improved.  On admission he was also found with thrombocytopenia with platelets of 115 and had dropped to 104 but it resumed to normal at 150 today.  He will also need to follow-up with a primary care when he is established. Patient was extensively counseled on stopping drinking.  I offered resources for him to go to to stop drinking and get counseling however he declined he stated he has had these resources and did not want to use them in the past but he is willing to go now but he will do it himself.   Discharge Diagnoses:  Principal Problem:   Seizure [facility]) Active Problems:   Alcohol abuse   Hypomagnesemia    Discharge Instructions  Discharge Instructions    Call MD for:  persistant  dizziness or light-headedness   Complete by: As directed    Diet - low sodium heart healthy   Complete by: As directed    Discharge instructions   Complete by: As directed    CANNOT  drive, operate heavy machinery, perform activities at heights and participate in water activities until release by outpatient physician. Follow up with Neurology Dr. Darlin Drop at [facility] in one week. Follow up with a primary care in one week STOP DRINKing  please. Take medications as prescribed.   Face-to-face encounter (required for Medicare/Medicaid patients)   Complete by: As directed    I Nolberto Hanlon certify that this patient is under my care and that I, or a nurse practitioner or physician's assistant working with me, had a face-to-face encounter that meets the physician face-to-face encounter requirements with this patient on 11/28/2019. The encounter with the patient was in whole, or in part for the following medical condition(s) which is the primary reason for home health care (List medical condition): gait instability   The encounter with the patient was in whole, or in part, for the following medical condition, which is the primary reason for home health care: gait instability, weakness   I certify that, based on my findings, the following services are medically necessary home health services: Physical therapy   Reason for Medically Necessary Home Health Services: Therapy- Personnel officer, Public librarian   My clinical findings support the need for the above services: Unable to leave home safely without assistance and/or assistive device   Further, I certify that my clinical findings support that this patient is homebound due to: Unsafe ambulation due to balance issues   Home Health   Complete by: As directed    To provide the following care/treatments:  PT Social work     Increase activity slowly   Complete by: As directed      Allergies as of 11/28/2019   No Known Allergies     Medication List    STOP taking these medications   predniSONE 10 MG tablet Commonly known as: DELTASONE   sulfamethoxazole-trimethoprim 800-160 MG tablet Commonly known as: BACTRIM DS     TAKE these medications   amLODipine 2.5 MG tablet Commonly known as: NORVASC Take 1 tablet (2.5 mg total) by mouth daily. Start taking on: November 28, 8364   folic acid 1 MG tablet Commonly known as: FOLVITE Take 1 tablet (1 mg total) by  mouth daily. Start taking on: November 29, 2019   levETIRAcetam 250 MG tablet Commonly known as: KEPPRA Take 1 tablet (250 mg total) by mouth 2 (two) times daily.   multivitamin with minerals Tabs tablet Take 1 tablet by mouth daily. Start taking on: November 29, 2019   thiamine 100 MG tablet Take 1 tablet (100 mg total) by mouth daily.      Follow-up Information    [facility] EMERGENCY DEPARTMENT.   Specialty: Emergency Medicine Contact information: [place] [id number] [address] [place]       Anabel Bene, MD. Schedule an appointment as soon as possible for a visit in 1 week.   Specialty: Neurology Why: started on seizure meds, will need f/u  Contact information: [place] [facility] [place] [address] [phone]          No Known Allergies  Consultations:  Neurology   Procedures/Studies: EEG  Result Date: 11/22/2019 Alexis Goodell, MD     11/22/2019  1:04 PM ELECTROENCEPHALOGRAM REPORT Patient: Lance Green       Room #: 239A-AA Age: 44 y.o.        Sex: male Requesting Physician: Mayford Knife Report Date:  11/22/2019       Interpreting Physician: Thana Farr History: Lance Green is an 44 y.o. male with seizure activity s/p ETOH discontinuation Medications: Ativan, Folic acid, Keppra, Thiamine, MVI Conditions of Recording:  This is a 21 channel routine scalp EEG performed with bipolar and monopolar montages arranged in accordance to the international 10/20 system of electrode placement. One channel was dedicated to EKG recording. The patient is in the awake, drowsy and asleep states. Description:  The patient is asleep for most of the recording.  Briefly wakefulness can be evaluated and during these periods the waking background activity consists of a low voltage, symmetrical, fairly well organized, 8.5 Hz alpha activity, seen from the parieto-occipital  and posterior temporal regions.  Low voltage fast activity, poorly organized, is seen anteriorly and is at times superimposed on more posterior regions.  A mixture of theta and alpha rhythms are seen from the central and temporal regions. The patient drowses with slowing to irregular, low voltage theta and beta activity.  The patient goes in to a light sleep with symmetrical sleep spindles, vertex central sharp transients and irregular slow activity. No epileptiform activity is noted.  Hyperventilation was not performed.  Intermittent photic stimulation was performed but failed to illicit any change in the tracing.  IMPRESSION: Normal electroencephalogram, awake, asleep and with activation procedures. There are no focal lateralizing or epileptiform features. Thana Farr, MD Neurology [phone] 11/22/2019, 1:01 PM   CT Head Wo Contrast  Result Date: 11/21/2019 CLINICAL DATA:  Seizure-like activity, fall EXAM: CT HEAD WITHOUT CONTRAST CT CERVICAL SPINE WITHOUT CONTRAST TECHNIQUE: Multidetector CT imaging of the head and cervical spine was performed following the standard protocol without intravenous contrast. Multiplanar CT image reconstructions of the cervical spine were also generated. COMPARISON:  08/07/2018 FINDINGS: CT HEAD FINDINGS Brain: No evidence of acute infarction, hemorrhage, hydrocephalus, extra-axial collection or mass lesion/mass effect. Vascular: No hyperdense vessel or unexpected calcification. Skull: Normal. Negative for fracture or focal lesion. Sinuses/Orbits: No acute finding. Other: Soft tissue contusion about the left temporal fossa (series 3, image 10). CT CERVICAL SPINE FINDINGS Alignment: Straightening of the normal cervical lordosis. Skull base and vertebrae: No acute fracture. No primary bone lesion or focal pathologic process. Soft tissues and spinal canal: No prevertebral fluid or swelling. No visible canal hematoma. Disc levels: Focally mild disc space height loss with  anterior osteophytosis at C5-C6. Upper chest: Mild centrilobular emphysema. Other: None. IMPRESSION: 1.  No acute intracranial pathology. 2.  Soft tissue contusion about the left temporal fossa. 3.  No fracture or static subluxation of the cervical spine. 4.  Emphysema (ICD10-J43.9). Electronically Signed   By: Lauralyn Primes M.D.   On: 11/21/2019 16:19   CT Cervical Spine Wo Contrast  Result Date: 11/21/2019 CLINICAL DATA:  Seizure-like activity, fall EXAM: CT HEAD WITHOUT CONTRAST CT CERVICAL SPINE WITHOUT CONTRAST TECHNIQUE: Multidetector CT imaging of the head and cervical spine was performed following the standard protocol without intravenous contrast. Multiplanar CT image reconstructions of the cervical spine were also generated. COMPARISON:  08/07/2018 FINDINGS: CT HEAD FINDINGS Brain: No evidence of acute infarction, hemorrhage, hydrocephalus, extra-axial collection or mass lesion/mass effect. Vascular: No hyperdense vessel or unexpected calcification. Skull: Normal. Negative for fracture or focal lesion. Sinuses/Orbits: No acute finding. Other: Soft tissue contusion about the  left temporal fossa (series 3, image 10). CT CERVICAL SPINE FINDINGS Alignment: Straightening of the normal cervical lordosis. Skull base and vertebrae: No acute fracture. No primary bone lesion or focal pathologic process. Soft tissues and spinal canal: No prevertebral fluid or swelling. No visible canal hematoma. Disc levels: Focally mild disc space height loss with anterior osteophytosis at C5-C6. Upper chest: Mild centrilobular emphysema. Other: None. IMPRESSION: 1.  No acute intracranial pathology. 2.  Soft tissue contusion about the left temporal fossa. 3.  No fracture or static subluxation of the cervical spine. 4.  Emphysema (ICD10-J43.9). Electronically Signed   By: Lauralyn PrimesAlex  Bibbey M.D.   On: 11/21/2019 16:19   MR BRAIN WO CONTRAST  Result Date: 11/23/2019 CLINICAL DATA:  44 year old male with recent seizure like activity  and fall. Possible ETOH withdrawal seizures. EXAM: MRI HEAD WITHOUT CONTRAST TECHNIQUE: Multiplanar, multiecho pulse sequences of the brain and surrounding structures were obtained without intravenous contrast. COMPARISON:  Head CT 11/21/2019. FINDINGS: Study is intermittently degraded by motion artifact despite repeated imaging attempts, and the examination had to be discontinued prior to completion of thin coronal images due to patient agitation. Brain: No restricted diffusion to suggest acute infarction. No midline shift, mass effect, evidence of mass lesion, ventriculomegaly, extra-axial collection or acute intracranial hemorrhage. Cervicomedullary junction and pituitary are within normal limits. Wallace CullensGray and white matter signal is within normal limits for age. High-resolution coronal images of the temporal lobes are degraded. No encephalomalacia or chronic cerebral blood products identified. Vascular: Major intracranial vascular flow voids are preserved. Skull and upper cervical spine: Visualized bone marrow signal is within normal limits. Grossly normal visible upper cervical spine. Sinuses/Orbits: Negative orbits. Isolated posterior left ethmoid air cell mucosal thickening is stable. Remaining sinuses and mastoids are well pneumatized. Other: Grossly normal visible internal auditory structures. Left lateral scalp hematoma again noted, most conspicuous on series 7, image 21. IMPRESSION: 1. Motion degraded exam with no acute intracranial abnormality, negative noncontrast MRI appearance of the brain. 2. Left lateral scalp hematoma. Electronically Signed   By: Odessa FlemingH  Hall M.D.   On: 11/23/2019 15:54   ECHOCARDIOGRAM COMPLETE  Result Date: 11/28/2019    ECHOCARDIOGRAM REPORT   Patient Name:   Lance Green Date of Exam: 11/28/2019 Medical Rec #:  [id number]       Height:       67.0 in Accession #:    [id number]      Weight:       121.8 lb Date of Birth:  [birth year]      BSA:          1.638 m Patient Age:    43 years         BP:           126/86 mmHg Patient Gender: M               HR:           94 bpm. Exam Location:  [facility] Procedure: 2D Echo, Cardiac Doppler and Color Doppler Indications:     Abnormal ECG 794.31  History:         Patient has no prior history of Echocardiogram examinations.                  Alcohol abuse.  Sonographer:     Cristela BlueJerry Hege RDCS (AE) Referring Phys:  [id number] [facility] Tallia Moehring Diagnosing Phys: Lorine BearsMuhammad Arida MD IMPRESSIONS  1. Left ventricular ejection fraction, by estimation, is 60 to 65%. The left ventricle has  normal function. The left ventricle has no regional wall motion abnormalities. Left ventricular diastolic parameters were normal.  2. Right ventricular systolic function is normal. The right ventricular size is normal. There is normal pulmonary artery systolic pressure.  3. The mitral valve is normal in structure and function. No evidence of mitral valve regurgitation. No evidence of mitral stenosis.  4. The aortic valve is normal in structure and function. Aortic valve regurgitation is not visualized. No aortic stenosis is present.  5. The inferior vena cava is normal in size with greater than 50% respiratory variability, suggesting right atrial pressure of 3 mmHg. Conclusion(s)/Recommendation(s): Normal biventricular function without evidence of hemodynamically significant valvular heart disease. FINDINGS  Left Ventricle: Left ventricular ejection fraction, by estimation, is 60 to 65%. The left ventricle has normal function. The left ventricle has no regional wall motion abnormalities. The left ventricular internal cavity size was normal in size. There is  no left ventricular hypertrophy. Left ventricular diastolic parameters were normal. Right Ventricle: The right ventricular size is normal. No increase in right ventricular wall thickness. Right ventricular systolic function is normal. There is normal pulmonary artery systolic pressure. The tricuspid regurgitant velocity is 1.81 m/s, and  with an  assumed right atrial pressure of 10 mmHg, the estimated right ventricular systolic pressure is 23.1 mmHg. Left Atrium: Left atrial size was normal in size. Right Atrium: Right atrial size was normal in size. Pericardium: There is no evidence of pericardial effusion. Mitral Valve: The mitral valve is normal in structure and function. Normal mobility of the mitral valve leaflets. No evidence of mitral valve regurgitation. No evidence of mitral valve stenosis. Tricuspid Valve: The tricuspid valve is normal in structure. Tricuspid valve regurgitation is not demonstrated. No evidence of tricuspid stenosis. Aortic Valve: The aortic valve is normal in structure and function. Aortic valve regurgitation is not visualized. No aortic stenosis is present. Aortic valve mean gradient measures 1.7 mmHg. Aortic valve peak gradient measures 2.9 mmHg. Aortic valve area, by VTI measures 3.51 cm. Pulmonic Valve: The pulmonic valve was normal in structure. Pulmonic valve regurgitation is not visualized. No evidence of pulmonic stenosis. Aorta: The aortic root is normal in size and structure. Venous: The inferior vena cava is normal in size with greater than 50% respiratory variability, suggesting right atrial pressure of 3 mmHg. IAS/Shunts: No atrial level shunt detected by color flow Doppler.  LEFT VENTRICLE PLAX 2D LVIDd:         4.18 cm  Diastology LVIDs:         2.65 cm  LV e' lateral:   13.10 cm/s LV PW:         1.04 cm  LV E/e' lateral: 7.0 LV IVS:        1.01 cm  LV e' medial:    12.50 cm/s LVOT diam:     2.00 cm  LV E/e' medial:  7.4 LV SV:         55.29 ml LV SV Index:   33.76 LVOT Area:     3.14 cm  RIGHT VENTRICLE RV Basal diam:  2.71 cm RV S prime:     15.10 cm/s TAPSE (M-mode): 3.5 cm LEFT ATRIUM             Index       RIGHT ATRIUM           Index LA diam:        2.70 cm 1.65 cm/m  RA Area:     10.70 cm LA  Vol [facility]):   34.1 ml 20.82 ml/m RA Volume:   26.00 ml  15.87 ml/m LA Vol (A4C):   24.5 ml 14.96 ml/m LA Biplane  Vol: 30.2 ml 18.44 ml/m  AORTIC VALVE                   PULMONIC VALVE AV Area (Vmax):    2.98 cm    RVOT Peak grad: 2 mmHg AV Area (Vmean):   3.11 cm AV Area (VTI):     3.51 cm AV Vmax:           85.60 cm/s AV Vmean:          59.033 cm/s AV VTI:            0.158 m AV Peak Grad:      2.9 mmHg AV Mean Grad:      1.7 mmHg LVOT Vmax:         81.30 cm/s LVOT Vmean:        58.400 cm/s LVOT VTI:          0.176 m LVOT/AV VTI ratio: 1.12  AORTA Ao Root diam: 3.00 cm MITRAL VALVE               TRICUSPID VALVE MV Area (PHT): 3.91 cm    TR Peak grad:   13.1 mmHg MV Decel Time: 194 msec    TR Vmax:        181.00 cm/s MV E velocity: 92.20 cm/s MV A velocity: 75.20 cm/s  SHUNTS MV E/A ratio:  1.23        Systemic VTI:  0.18 m                            Systemic Diam: 2.00 cm Lorine Bears MD Electronically signed by Lorine Bears MD Signature Date/Time: 11/28/2019/11:39:07 AM    Final    [place] EKG SITE RITE  Result Date: 11/26/2019 If Site Rite image not attached, placement could not be confirmed due to current cardiac rhythm.      Subjective:   Discharge Exam: Vitals:   11/28/19 0449 11/28/19 0733  BP: 140/80 126/86  Pulse: 72 94  Resp:  19  Temp: 99.4 F (37.4 C) 98.2 F (36.8 C)  SpO2: 99% 100%   Vitals:   11/27/19 1542 11/27/19 2003 11/28/19 0449 11/28/19 0733  BP: (!) 153/94 (!) 166/85 140/80 126/86  Pulse: 85 72 72 94  Resp: 19   19  Temp: 98.1 F (36.7 C) 99 F (37.2 C) 99.4 F (37.4 C) 98.2 F (36.8 C)  TempSrc: Oral Oral Oral Oral  SpO2: 100% 100% 99% 100%  Weight:      Height:        General: Pt is alert, awake, not in acute distress Cardiovascular: RRR, S1/S2 +, no rubs, no gallops Respiratory: CTA bilaterally, no wheezing, no rhonchi Abdominal: Soft, NT, ND, bowel sounds + Extremities: no edema, no cyanosis    The results of significant diagnostics from this hospitalization (including imaging, microbiology, ancillary and laboratory) are listed below for reference.      Microbiology: Recent Results (from the past 240 hour(s))  SARS CORONAVIRUS 2 (TAT 6-24 HRS) Nasopharyngeal Nasopharyngeal Swab     Status: None   Collection Time: 11/21/19 10:54 PM   Specimen: Nasopharyngeal Swab  Result Value Ref Range Status   SARS Coronavirus 2 NEGATIVE NEGATIVE Final    Comment: (NOTE) SARS-CoV-2 target nucleic acids are NOT DETECTED.  The SARS-CoV-2 RNA is generally detectable in upper and lower respiratory specimens during the acute phase of infection. Negative results do not preclude SARS-CoV-2 infection, do not rule out co-infections with other pathogens, and should not be used as the sole basis for treatment or other patient management decisions. Negative results must be combined with clinical observations, patient history, and epidemiological information. The expected result is Negative. Fact Sheet for Patients: HairSlick.no Fact Sheet for Healthcare Providers: quierodirigir.com This test is not yet approved or cleared by the [place] FDA and  has been authorized for detection and/or diagnosis of SARS-CoV-2 by FDA under an Emergency Use Authorization (EUA). This EUA will remain  in effect (meaning this test can be used) for the duration of the COVID-19 declaration under Section 56 4(b)(1) of the Act, 21 U.S.C. section 360bbb-3(b)(1), unless the authorization is terminated or revoked sooner. Performed at [facility], [address] [place], [place] [address]   MRSA PCR Screening     Status: None   Collection Time: 11/26/19  2:10 PM   Specimen: Nasal Mucosa; Nasopharyngeal  Result Value Ref Range Status   MRSA by PCR NEGATIVE NEGATIVE Final    Comment:        The GeneXpert MRSA Assay (FDA approved for NASAL specimens only), is one component of a comprehensive MRSA colonization surveillance program. It is not intended to diagnose MRSA infection nor to guide or monitor treatment  for MRSA infections. Performed at [facility], [address] [place], [place] [address]      Labs: BNP (last 3 results) No results for input(s): BNP in the last 8760 hours. Basic Metabolic Panel: Recent Labs  Lab 11/21/19 1539 11/21/19 1539 11/22/19 0222 11/22/19 0222 11/23/19 0517 11/23/19 0517 11/24/19 0431 11/25/19 0642 11/26/19 1056 11/27/19 0431 11/28/19 0514  NA 139   < > 135  --  137  --  139 138  --  141  --   K 3.7   < > 3.0*   < > 3.5   < > 3.3* 3.1* 2.8* 3.0* 3.6  CL 98   < > 96*  --  103  --  107 106  --  107  --   CO2 25   < > 30  --  23  --  21* 23  --  27  --   GLUCOSE 150*   < > 158*  --  131*  --  115* 101*  --  111*  --   BUN <5*   < > <5*  --  <5*  --  <5* <5*  --  7  --   CREATININE 0.67   < > 0.70  --  0.68  --  0.54* 0.57*  --  0.64  --   CALCIUM 9.3   < > 8.9  --  8.4*  --  8.4* 8.0*  --  8.7*  --   MG 1.2*  --  1.5*  --  1.7  --   --   --  1.5* 1.8  --   PHOS  --   --  3.2  --  2.7  --   --   --   --  3.7  --    < > = values in this interval not displayed.   Liver Function Tests: Recent Labs  Lab 11/21/19 1539 11/21/19 1539 11/22/19 0222 11/23/19 0517 11/23/19 1755 11/25/19 0642 11/27/19 0431  AST 187*  --  131* 91* 99* 86*  --  ALT 51*  --  46* 34 34 34  --   ALKPHOS 147*  --  147* 125 122 112  --   BILITOT 1.1  --  1.1 0.9 0.9 0.9  --   PROT 8.2*  --  7.9 7.1 7.3 6.5  --   ALBUMIN 4.2   < > 3.8 3.4* 3.4* 3.0* 3.0*   < > = values in this interval not displayed.   Recent Labs  Lab 11/21/19 1638  LIPASE 15   No results for input(s): AMMONIA in the last 168 hours. CBC: Recent Labs  Lab 11/21/19 1539 11/22/19 0222 11/23/19 0517 11/24/19 0431 11/28/19 0514  WBC 7.1 9.4 6.8 8.4 6.5  NEUTROABS 5.4  --   --   --   --   HGB 12.6* 12.8* 12.4* 13.5 9.9*  HCT 35.5* 36.2* 36.1* 36.9* 28.5*  MCV 90.3 90.0 92.3 87.6 91.9  PLT 115* 111* 106* 104* 150   Cardiac Enzymes: No results for input(s): CKTOTAL, CKMB, CKMBINDEX,  TROPONINI in the last 168 hours. BNP: Invalid input(s): POCBNP CBG: Recent Labs  Lab 11/27/19 0738 11/27/19 1142 11/27/19 1638 11/27/19 2114 11/28/19 0736  GLUCAP 110* 107* 122* 186* 113*   D-Dimer No results for input(s): DDIMER in the last 72 hours. Hgb A1c No results for input(s): HGBA1C in the last 72 hours. Lipid Profile No results for input(s): CHOL, HDL, LDLCALC, TRIG, CHOLHDL, LDLDIRECT in the last 72 hours. Thyroid function studies No results for input(s): TSH, T4TOTAL, T3FREE, THYROIDAB in the last 72 hours.  Invalid input(s): FREET3 Anemia work up No results for input(s): VITAMINB12, FOLATE, FERRITIN, TIBC, IRON, RETICCTPCT in the last 72 hours. Urinalysis    Component Value Date/Time   COLORURINE YELLOW (A) 07/06/2018 1354   APPEARANCEUR CLEAR (A) 07/06/2018 1354   APPEARANCEUR Hazy 01/18/2014 1701   LABSPEC 1.013 07/06/2018 1354   LABSPEC 1.013 01/18/2014 1701   PHURINE 5.0 07/06/2018 1354   GLUCOSEU NEGATIVE 07/06/2018 1354   GLUCOSEU Negative 01/18/2014 1701   HGBUR SMALL (A) 07/06/2018 1354   BILIRUBINUR NEGATIVE 07/06/2018 1354   BILIRUBINUR Negative 01/18/2014 1701   KETONESUR 20 (A) 07/06/2018 1354   PROTEINUR 100 (A) 07/06/2018 1354   NITRITE NEGATIVE 07/06/2018 1354   LEUKOCYTESUR NEGATIVE 07/06/2018 1354   LEUKOCYTESUR Negative 01/18/2014 1701   Sepsis Labs Invalid input(s): PROCALCITONIN,  WBC,  LACTICIDVEN Microbiology Recent Results (from the past 240 hour(s))  SARS CORONAVIRUS 2 (TAT 6-24 HRS) Nasopharyngeal Nasopharyngeal Swab     Status: None   Collection Time: 11/21/19 10:54 PM   Specimen: Nasopharyngeal Swab  Result Value Ref Range Status   SARS Coronavirus 2 NEGATIVE NEGATIVE Final    Comment: (NOTE) SARS-CoV-2 target nucleic acids are NOT DETECTED. The SARS-CoV-2 RNA is generally detectable in upper and lower respiratory specimens during the acute phase of infection. Negative results do not preclude SARS-CoV-2 infection, do not  rule out co-infections with other pathogens, and should not be used as the sole basis for treatment or other patient management decisions. Negative results must be combined with clinical observations, patient history, and epidemiological information. The expected result is Negative. Fact Sheet for Patients: HairSlick.no Fact Sheet for Healthcare Providers: quierodirigir.com This test is not yet approved or cleared by the [place] FDA and  has been authorized for detection and/or diagnosis of SARS-CoV-2 by FDA under an Emergency Use Authorization (EUA). This EUA will remain  in effect (meaning this test can be used) for the duration of the COVID-19 declaration under Section  56 4(b)(1) of the Act, 21 U.S.C. section 360bbb-3(b)(1), unless the authorization is terminated or revoked sooner. Performed at [facility], [address] [place], [place] [address]   MRSA PCR Screening     Status: None   Collection Time: 11/26/19  2:10 PM   Specimen: Nasal Mucosa; Nasopharyngeal  Result Value Ref Range Status   MRSA by PCR NEGATIVE NEGATIVE Final    Comment:        The GeneXpert MRSA Assay (FDA approved for NASAL specimens only), is one component of a comprehensive MRSA colonization surveillance program. It is not intended to diagnose MRSA infection nor to guide or monitor treatment for MRSA infections. Performed at [facility], [address] [place], [place] [address]      Time coordinating discharge: Over 30 minutes  SIGNED:   Lynn Ito, MD  [facility] 11/28/2019, 11:41 AM Pager   If 7PM-7AM, please contact night-coverage [email or web address] Password [email or web address]

## 2019-11-28 NOTE — Progress Notes (Signed)
Physical Therapy Treatment Patient Details Name: Lance Green MRN: [id number] DOB: [birth year] Today's Date: 11/28/2019    History of Present Illness Lance Green is a 43yoMPMH significant of chronic alcoholism,PUD, alcoholic pancreatitis and neuropathy who presented to the ER with multiple episodes of seizures.  Patient was observed to have seizure in the ER twice. Per documentation patient had one fall during hospitalization and required use of sitter. Transferred to ICU for Precedex initiation, however unable to tolerate tx and stopped after 1 hour.    PT Comments    Pt in bed upon entry, RN at bedside. Pt agreeable to participate. Pt able to AMB 2 laps around unit Green RW, then AMB 2 more with IV pole only. No LOB noted. Pt reports to feel very close to his baseline for mobility. All bed mobility and transfers performed with modified independence. Pt progressing well.    Follow Up Recommendations  Home health PT;Supervision - Intermittent     Equipment Recommendations  Rolling walker with 5" wheels;3in1 (PT)    Recommendations for Other Services       Precautions / Restrictions Precautions Precautions: Fall Precaution Comments: Has been going to BR by himself this date. Restrictions Weight Bearing Restrictions: No    Mobility  Bed Mobility Overal bed mobility: Independent                Transfers Overall transfer level: Independent Equipment used: None Transfers: Sit to/from Stand              Ambulation/Gait Ambulation/Gait assistance: Scientist, forensic (Feet): 800 Feet Assistive device: Rolling walker (2 wheeled);IV Pole   Gait velocity: 1.74m/s   General Gait Details: back to baseline; 2 laps with RW; 2 laps with IV pole   Stairs             Wheelchair Mobility    Modified Rankin (Stroke Patients Only)       Balance                                            Cognition Arousal/Alertness:  Awake/alert Behavior During Therapy: WFL for tasks assessed/performed Overall Cognitive Status: Within Functional Limits for tasks assessed                                        Exercises      General Comments        Pertinent Vitals/Pain Pain Assessment: No/denies pain    Home Living                      Prior Function            PT Goals (current goals can now be found in the care plan section) Acute Rehab PT Goals Patient Stated Goal: to get back to "normal" and be able to walk PT Goal Formulation: With patient Time For Goal Achievement: 12/11/19 Potential to Achieve Goals: Good Progress towards PT goals: Progressing toward goals    Frequency    Min 2X/week      PT Plan Current plan remains appropriate    Co-evaluation              AM-PAC PT "6 Clicks" Mobility   Outcome Measure  Help needed turning from your back  to your side while in a flat bed without using bedrails?: None Help needed moving from lying on your back to sitting on the side of a flat bed without using bedrails?: A Little Help needed moving to and from a bed to a chair (including a wheelchair)?: A Little Help needed standing up from a chair using your arms (e.g., wheelchair or bedside chair)?: A Little Help needed to walk in hospital room?: A Lot Help needed climbing 3-5 steps with a railing? : A Lot 6 Click Score: 17    End of Session Equipment Utilized During Treatment: Gait belt Activity Tolerance: Patient tolerated treatment well;No increased pain Patient left: with call bell/phone within reach;in chair;with nursing/sitter in room Nurse Communication: Mobility status;Other (comment) PT Visit Diagnosis: Unsteadiness on feet (R26.81);Other abnormalities of gait and mobility (R26.89);Muscle weakness (generalized) (M62.81);Difficulty in walking, not elsewhere classified (R26.2)     Time: 9969-2493 PT Time Calculation (min) (ACUTE ONLY): 22  min  Charges:  $Therapeutic Exercise: 8-22 mins                11:27 AM, 11/28/19 Rosamaria Lints, PT, DPT Physical Therapist - [facility]  [phone] (ASCOM)    Lance Green 11/28/2019, 11:25 AM

## 2019-11-28 NOTE — Progress Notes (Signed)
*  PRELIMINARY RESULTS* Echocardiogram 2D Echocardiogram has been performed.  Lance Green 11/28/2019, 8:49 AM

## 2020-01-23 ENCOUNTER — Other Ambulatory Visit: Payer: Self-pay

## 2020-01-23 ENCOUNTER — Emergency Department
Admission: EM | Admit: 2020-01-23 | Discharge: 2020-01-23 | Disposition: A | Discharge status: Home / Self Care | Payer: Self-pay | Attending: Emergency Medicine | Admitting: Emergency Medicine

## 2020-01-23 ENCOUNTER — Encounter: Payer: Self-pay | Admitting: Emergency Medicine

## 2020-01-23 DIAGNOSIS — R569 Unspecified convulsions: Secondary | ICD-10-CM | POA: Insufficient documentation

## 2020-01-23 DIAGNOSIS — F17210 Nicotine dependence, cigarettes, uncomplicated: Secondary | ICD-10-CM | POA: Insufficient documentation

## 2020-01-23 DIAGNOSIS — F10939 Alcohol use, unspecified with withdrawal, unspecified: Secondary | ICD-10-CM

## 2020-01-23 DIAGNOSIS — F10239 Alcohol dependence with withdrawal, unspecified: Secondary | ICD-10-CM | POA: Insufficient documentation

## 2020-01-23 DIAGNOSIS — R030 Elevated blood-pressure reading, without diagnosis of hypertension: Secondary | ICD-10-CM | POA: Insufficient documentation

## 2020-01-23 DIAGNOSIS — Y900 Blood alcohol level of less than 20 mg/100 ml: Secondary | ICD-10-CM | POA: Insufficient documentation

## 2020-01-23 DIAGNOSIS — Z79899 Other long term (current) drug therapy: Secondary | ICD-10-CM | POA: Insufficient documentation

## 2020-01-23 LAB — CBC WITH DIFFERENTIAL/PLATELET
Abs Immature Granulocytes: 0.04 10*3/uL (ref 0.00–0.07)
Basophils Absolute: 0 10*3/uL (ref 0.0–0.1)
Basophils Relative: 0 %
Eosinophils Absolute: 0 10*3/uL (ref 0.0–0.5)
Eosinophils Relative: 0 %
HCT: 37 % — ABNORMAL LOW (ref 39.0–52.0)
Hemoglobin: 13 g/dL (ref 13.0–17.0)
Immature Granulocytes: 1 %
Lymphocytes Relative: 12 %
Lymphs Abs: 0.6 10*3/uL — ABNORMAL LOW (ref 0.7–4.0)
MCH: 31.9 pg (ref 26.0–34.0)
MCHC: 35.1 g/dL (ref 30.0–36.0)
MCV: 90.9 fL (ref 80.0–100.0)
Monocytes Absolute: 0.4 10*3/uL (ref 0.1–1.0)
Monocytes Relative: 8 %
Neutro Abs: 4.2 10*3/uL (ref 1.7–7.7)
Neutrophils Relative %: 79 %
Platelets: 94 10*3/uL — ABNORMAL LOW (ref 150–400)
RBC: 4.07 MIL/uL — ABNORMAL LOW (ref 4.22–5.81)
RDW: 13 % (ref 11.5–15.5)
Smear Review: DECREASED
WBC: 5.3 10*3/uL (ref 4.0–10.5)
nRBC: 0 % (ref 0.0–0.2)

## 2020-01-23 LAB — BASIC METABOLIC PANEL
Anion gap: 19 — ABNORMAL HIGH (ref 5–15)
BUN: <5 mg/dL — ABNORMAL LOW (ref 6–20)
CO2: 22 mmol/L (ref 22–32)
Calcium: 9.2 mg/dL (ref 8.9–10.3)
Chloride: 97 mmol/L — ABNORMAL LOW (ref 98–111)
Creatinine, Ser: 0.77 mg/dL (ref 0.61–1.24)
GFR calc Af Amer: >60 mL/min (ref >60)
GFR calc non Af Amer: >60 mL/min (ref >60)
Glucose, Bld: 175 mg/dL — ABNORMAL HIGH (ref 70–99)
Potassium: 3.9 mmol/L (ref 3.5–5.1)
Sodium: 138 mmol/L (ref 135–145)

## 2020-01-23 LAB — URINE DRUG SCREEN, QUALITATIVE (ARMC ONLY)
Amphetamines, Ur Screen: NOT DETECTED
Barbiturates, Ur Screen: NOT DETECTED
Benzodiazepine, Ur Scrn: POSITIVE — AB
Cannabinoid 50 Ng, Ur ~~LOC~~: NOT DETECTED
Cocaine Metabolite,Ur ~~LOC~~: NOT DETECTED
MDMA (Ecstasy)Ur Screen: NOT DETECTED
Methadone Scn, Ur: NOT DETECTED
Opiate, Ur Screen: NOT DETECTED
Phencyclidine (PCP) Ur S: NOT DETECTED
Tricyclic, Ur Screen: NOT DETECTED

## 2020-01-23 LAB — HEPATIC FUNCTION PANEL
ALT: 78 U/L — ABNORMAL HIGH (ref 0–44)
AST: 275 U/L — ABNORMAL HIGH (ref 15–41)
Albumin: 3.8 g/dL (ref 3.5–5.0)
Alkaline Phosphatase: 188 U/L — ABNORMAL HIGH (ref 38–126)
Bilirubin, Direct: 0.3 mg/dL — ABNORMAL HIGH (ref 0.0–0.2)
Indirect Bilirubin: 0.6 mg/dL (ref 0.3–0.9)
Total Bilirubin: 0.9 mg/dL (ref 0.3–1.2)
Total Protein: 8 g/dL (ref 6.5–8.1)

## 2020-01-23 LAB — GLUCOSE, CAPILLARY: Glucose-Capillary: 211 mg/dL — ABNORMAL HIGH (ref 70–99)

## 2020-01-23 LAB — ETHANOL: Alcohol, Ethyl (B): <10 mg/dL (ref <10)

## 2020-01-23 MED ORDER — CHLORDIAZEPOXIDE HCL 25 MG PO CAPS
50.0000 mg | ORAL_CAPSULE | Freq: Once | ORAL | Status: AC
  Administered 2020-01-23: 50 mg via ORAL
  Filled 2020-01-23: qty 2

## 2020-01-23 MED ORDER — CHLORDIAZEPOXIDE HCL 25 MG PO CAPS: 50.0000 mg | ORAL_CAPSULE | Freq: Three times a day (TID) | ORAL | 0 refills | Status: DC | PRN

## 2020-01-23 MED ORDER — LORAZEPAM 2 MG PO TABS
2.0000 mg | ORAL_TABLET | Freq: Once | ORAL | Status: AC
  Administered 2020-01-23: 2 mg via ORAL
  Filled 2020-01-23: qty 1

## 2020-01-23 NOTE — ED Provider Notes (Addendum)
[facility] Emergency Department Provider Note ____________________________________________   First MD Initiated Contact with Patient 01/23/20 1240     (approximate)  I have reviewed the triage vital signs and the nursing notes.   HISTORY  Chief Complaint Seizures    HPI [name] is a 44 y.o. male with PMH as noted below who presents after an apparent witnessed generalized seizure.  Upon EMS arrival, the patient was confused and appeared postictal.  He had incontinence and had vomited.  The patient states he does not remember what happened although believes he was in bed.  He states that he has not had a seizure since he was hospitalized in February.  He denies any acute complaints at this time.  He reports that he drinks four or five 40-ounce beers daily, but had not had anything to drink yet today and believes this is why he had the seizure.  Past Medical History:  Diagnosis Date  . Alcohol abuse   . Neuropathy   . Pancreatitis   . Peptic ulcer disease     Patient Active Problem List   Diagnosis Date Noted  . Seizure ([place]) 11/21/2019  . Alcohol abuse 11/21/2019  . Hypomagnesemia 11/21/2019  . Abscess of left middle finger 11/10/2018  . Acute appendicitis with localized peritonitis 07/06/2018    Past Surgical History:  Procedure Laterality Date  . APPENDECTOMY    . INCISION AND DRAINAGE Left 11/11/2018   Procedure: INCISION AND DRAINAGE;  Surgeon: Dereck Leep, MD;  Location: [facility] ORS;  Service: Orthopedics;  Laterality: Left;  . LAPAROSCOPIC APPENDECTOMY N/A 07/06/2018   Procedure: APPENDECTOMY LAPAROSCOPIC;  Surgeon: Herbert Pun, MD;  Location: [facility] ORS;  Service: General;  Laterality: N/A;    Prior to Admission medications   Medication Sig Start Date End Date Taking? Authorizing Provider  amLODipine (NORVASC) 2.5 MG tablet Take 1 tablet (2.5 mg total) by mouth daily. 11/29/19   Nolberto Hanlon, MD  chlordiazePOXIDE (LIBRIUM)  25 MG capsule Take 2 capsules (50 mg total) by mouth 3 (three) times daily as needed for withdrawal. 01/23/20   Arta Silence, MD  folic acid (FOLVITE) 1 MG tablet Take 1 tablet (1 mg total) by mouth daily. 11/29/19   Nolberto Hanlon, MD  levETIRAcetam (KEPPRA) 250 MG tablet Take 1 tablet (250 mg total) by mouth 2 (two) times daily. 11/28/19   Nolberto Hanlon, MD  Multiple Vitamin (MULTIVITAMIN WITH MINERALS) TABS tablet Take 1 tablet by mouth daily. 11/29/19   Nolberto Hanlon, MD  thiamine 100 MG tablet Take 1 tablet (100 mg total) by mouth daily. 11/28/19   Nolberto Hanlon, MD    Allergies Patient has no known allergies.  History reviewed. No pertinent family history.  Social History Social History   Tobacco Use  . Smoking status: Current Every Day Smoker    Packs/day: 0.50    Types: Cigarettes  . Smokeless tobacco: Never Used  Substance Use Topics  . Alcohol use: Yes    Comment: daily 40 oz, reports 4 40oz/day  . Drug use: No    Review of Systems  Constitutional: No fever. Eyes: No redness. ENT: No sore throat. Cardiovascular: Denies chest pain. Respiratory: Denies shortness of breath. Gastrointestinal: Positive for resolved vomiting. Genitourinary: Negative for dysuria.  Musculoskeletal: Negative for back pain. Skin: Negative for rash. Neurological: Negative for headache.   ____________________________________________   PHYSICAL EXAM:  VITAL SIGNS: ED Triage Vitals  Enc Vitals Group     BP 01/23/20 1233 (!) 174/117  Pulse Rate 01/23/20 1233 94     Resp 01/23/20 1233 19     Temp 01/23/20 1233 98.2 F (36.8 C)     Temp Source 01/23/20 1233 Oral     SpO2 01/23/20 1233 99 %     Weight 01/23/20 1234 121 lb 4.1 oz (55 kg)     Height 01/23/20 1234 5\' 7"  (1.702 m)     Head Circumference --      Peak Flow --      Pain Score 01/23/20 1234 0     Pain Loc --      Pain Edu? --      Excl. in GC? --     Constitutional: Alert and oriented.  Relatively well appearing and  in no acute distress. Eyes: Conjunctivae are normal.  EOMI.  PERRLA. Head: Atraumatic. Nose: No congestion/rhinnorhea. Mouth/Throat: Mucous membranes are slightly dry.  Tongue fasciculation. Neck: Normal range of motion.  Cardiovascular: Normal rate, regular rhythm.   Good peripheral circulation. Respiratory: Normal respiratory effort.  No retractions.  Gastrointestinal: No distention.  Musculoskeletal: Extremities warm and well perfused.  Neurologic:  Normal speech and language.  Motor and sensory intact in all extremities.  Normal coordination.  No significant tremor. Skin:  Skin is warm and dry. No rash noted. Psychiatric: Mood and affect are normal. Speech and behavior are normal.  ____________________________________________   LABS (all labs ordered are listed, but only abnormal results are displayed)  Labs Reviewed  GLUCOSE, CAPILLARY - Abnormal; Notable for the following components:      Result Value   Glucose-Capillary 211 (*)    All other components within normal limits  BASIC METABOLIC PANEL - Abnormal; Notable for the following components:   Chloride 97 (*)    Glucose, Bld 175 (*)    BUN <5 (*)    Anion gap 19 (*)    All other components within normal limits  HEPATIC FUNCTION PANEL - Abnormal; Notable for the following components:   AST 275 (*)    ALT 78 (*)    Alkaline Phosphatase 188 (*)    Bilirubin, Direct 0.3 (*)    All other components within normal limits  CBC WITH DIFFERENTIAL/PLATELET - Abnormal; Notable for the following components:   RBC 4.07 (*)    HCT 37.0 (*)    Platelets 94 (*)    Lymphs Abs 0.6 (*)    All other components within normal limits  URINE DRUG SCREEN, QUALITATIVE ([facility] ONLY) - Abnormal; Notable for the following components:   Benzodiazepine, Ur Scrn POSITIVE (*)    All other components within normal limits  ETHANOL  CBG MONITORING, ED   ____________________________________________  EKG  ED ECG REPORT I, 01/25/20, the  attending physician, personally viewed and interpreted this ECG.  Date: 01/23/2020 EKG Time: 1232 Rate: 91 Rhythm: normal sinus rhythm QRS Axis: normal Intervals: normal ST/T Wave abnormalities: normal (interpretation limited due to poor quality baseline) Narrative Interpretation: no evidence of acute ischemia  ____________________________________________  RADIOLOGY    ____________________________________________   PROCEDURES  Procedure(s) performed: No  Procedures  Critical Care performed: No ____________________________________________   INITIAL IMPRESSION / ASSESSMENT AND PLAN / ED COURSE  Pertinent labs & imaging results that were available during my care of the patient were reviewed by me and considered in my medical decision making (see chart for details).  44 year old male with a history of alcohol abuse, seizure disorder, and other PMH as noted above presents after an apparent witnessed generalized tonic-clonic seizure.  He  normally drinks 4 to 5 40-ounce bottles of beer daily, and states that he had not had anything to drink today.  The patient denies any acute complaints at this time and has returned to his baseline.  He received Versed by EMS.  I reviewed the past medical records in Epic.  He was most recently admitted in February with recurrent seizures and concern for alcohol withdrawal.  He was weaned off alcohol via CIWA protocol and had otherwise negative seizure work-up.  He was started on Keppra.  The patient reports that he did not continue this medication after leaving the hospital, and does not take any medications.  On exam, currently the patient is alert and oriented x4.  His vital signs are normal except for hypertension.  Neurologic exam is nonfocal.  He has some tongue fasciculation but no significant tremor or asterixis.  Overall I suspect most likely seizure related to the patient's known seizure disorder or possible alcohol withdrawal.  In  addition to the Versed given by EMS, I have ordered p.o. Librium.  We will obtain lab work-up and observe the patient for further signs of withdrawal.  ----------------------------------------- 2:43 PM on 01/23/2020 -----------------------------------------  The patient's vital signs have improved.  However, he is still somewhat hypertensive.  He continues to have tongue fasciculation but no significant tremor.  I had a discussion with the patient about his goals of care.  I am concerned that the seizure may have been related to alcohol withdrawal, and I offered the patient admission for further management of alcohol withdrawal, detox, and further monitoring for seizures, similar to his last admission in February.  However, the patient states that he is not ready for detox, or to quit drinking.  He also states that he cannot go through detox because he needs to work and cannot afford to be in the hospital and miss his rent payments.  I informed the patient that if he goes into alcohol withdrawal again, he can have seizures, and alcohol withdrawal can lead to permanent disability or death.  He expresses understanding.  I have ordered some p.o. Ativan.  The patient states he will think over his options and we will reassess with him.  ----------------------------------------- 3:08 PM on 01/23/2020 -----------------------------------------  The patient confirms that he would like to go home. I again reiterated the risks of recurrent seizures, delirium, and permanent disability and death.  The patient continues to demonstrate understanding, and has appropriate decision-making capacity.  I gave him extensive return precautions and he expresses understanding.  I have also prescribed 2 days worth of Librium.  He understands he may return at any time if he changes his mind or has new or recurrent symptoms.   I have also included outpatient substance abuse resources in his discharge  paperwork.  ____________________________________________   FINAL CLINICAL IMPRESSION(S) / ED DIAGNOSES  Final diagnoses:  Seizure (HCC)  Alcohol withdrawal syndrome with complication (HCC)      NEW MEDICATIONS STARTED DURING THIS VISIT:  New Prescriptions   CHLORDIAZEPOXIDE (LIBRIUM) 25 MG CAPSULE    Take 2 capsules (50 mg total) by mouth 3 (three) times daily as needed for withdrawal.     Note:  This document was prepared using Dragon voice recognition software and may include unintentional dictation errors.       Dionne Bucy, MD 01/23/20 [phone]

## 2020-01-23 NOTE — Discharge Instructions (Signed)
You should return to the ER immediately for new, worsening, or recurrent seizures, shaking, weakness, vomiting, hallucinations, or any other new or worsening symptoms that concern you.  You may also return at any time if you wish to detox.

## 2020-01-23 NOTE — ED Triage Notes (Signed)
Pt arrival via [facility] from home due to seizure. EMS states that patient was at home when he had a witnessed tonic clonic seizure for 15 minutes. Pt does have a history of seizures but is not on any medication for it. EMS found him in a postictal state with confusion. Pt had soiled himself and vomited.  EMS attempted to get IV access but was unable to obtain, so they gave him 2 mg versed IM.   Pt states he had his seizure while he was in bed. Pt also states he drinks 4-5 beers a day but he hasn't had any today and 'that's the reason'.   VS with EMS: -173/109 HR 92 100% RA

## 2020-07-24 ENCOUNTER — Other Ambulatory Visit: Payer: Self-pay

## 2020-07-24 DIAGNOSIS — F17210 Nicotine dependence, cigarettes, uncomplicated: Secondary | ICD-10-CM | POA: Insufficient documentation

## 2020-07-24 DIAGNOSIS — R531 Weakness: Secondary | ICD-10-CM | POA: Insufficient documentation

## 2020-07-24 DIAGNOSIS — F1099 Alcohol use, unspecified with unspecified alcohol-induced disorder: Secondary | ICD-10-CM | POA: Insufficient documentation

## 2020-07-24 LAB — COMPREHENSIVE METABOLIC PANEL
ALT: 41 U/L (ref 0–44)
AST: 143 U/L — ABNORMAL HIGH (ref 15–41)
Albumin: 3.5 g/dL (ref 3.5–5.0)
Alkaline Phosphatase: 176 U/L — ABNORMAL HIGH (ref 38–126)
Anion gap: 12 (ref 5–15)
BUN: <5 mg/dL — ABNORMAL LOW (ref 6–20)
CO2: 25 mmol/L (ref 22–32)
Calcium: 8 mg/dL — ABNORMAL LOW (ref 8.9–10.3)
Chloride: 97 mmol/L — ABNORMAL LOW (ref 98–111)
Creatinine, Ser: 0.69 mg/dL (ref 0.61–1.24)
GFR, Estimated: >60 mL/min (ref >60)
Glucose, Bld: 105 mg/dL — ABNORMAL HIGH (ref 70–99)
Potassium: 4 mmol/L (ref 3.5–5.1)
Sodium: 134 mmol/L — ABNORMAL LOW (ref 135–145)
Total Bilirubin: 0.5 mg/dL (ref 0.3–1.2)
Total Protein: 7.8 g/dL (ref 6.5–8.1)

## 2020-07-24 LAB — URINALYSIS, COMPLETE (UACMP) WITH MICROSCOPIC
Bacteria, UA: NONE SEEN
Bilirubin Urine: NEGATIVE
Glucose, UA: NEGATIVE mg/dL
Hgb urine dipstick: NEGATIVE
Ketones, ur: NEGATIVE mg/dL
Leukocytes,Ua: NEGATIVE
Nitrite: NEGATIVE
Protein, ur: NEGATIVE mg/dL
Specific Gravity, Urine: 1.002 — ABNORMAL LOW (ref 1.005–1.030)
Squamous Epithelial / HPF: NONE SEEN (ref 0–5)
WBC, UA: NONE SEEN WBC/hpf (ref 0–5)
pH: 5 (ref 5.0–8.0)

## 2020-07-24 LAB — CBC
HCT: 31.8 % — ABNORMAL LOW (ref 39.0–52.0)
Hemoglobin: 11.3 g/dL — ABNORMAL LOW (ref 13.0–17.0)
MCH: 32 pg (ref 26.0–34.0)
MCHC: 35.5 g/dL (ref 30.0–36.0)
MCV: 90.1 fL (ref 80.0–100.0)
Platelets: 125 10*3/uL — ABNORMAL LOW (ref 150–400)
RBC: 3.53 MIL/uL — ABNORMAL LOW (ref 4.22–5.81)
RDW: 12.3 % (ref 11.5–15.5)
WBC: 5.9 10*3/uL (ref 4.0–10.5)
nRBC: 0 % (ref 0.0–0.2)

## 2020-07-24 NOTE — ED Triage Notes (Addendum)
EMS brought pt in from home for c/o neuropathy and diff walking; "1 beer today"

## 2020-07-24 NOTE — ED Triage Notes (Addendum)
Pt in with co generalized weakness that started over a year ago, has been dx with neuropathy. States feels like is having a flare up, states unable to walk due weakness. No co pain or shob at this time.

## 2020-07-25 ENCOUNTER — Emergency Department
Admission: EM | Admit: 2020-07-25 | Discharge: 2020-07-25 | Disposition: A | Discharge status: Home / Self Care | Payer: Self-pay | Attending: Emergency Medicine | Admitting: Emergency Medicine

## 2020-07-25 DIAGNOSIS — R531 Weakness: Secondary | ICD-10-CM

## 2020-07-25 DIAGNOSIS — F1020 Alcohol dependence, uncomplicated: Secondary | ICD-10-CM

## 2020-07-25 LAB — TROPONIN I (HIGH SENSITIVITY)
Troponin I (High Sensitivity): 24 ng/L — ABNORMAL HIGH (ref <18)
Troponin I (High Sensitivity): 24 ng/L — ABNORMAL HIGH (ref <18)

## 2020-07-25 MED ORDER — CHLORDIAZEPOXIDE HCL 10 MG PO CAPS: ORAL_CAPSULE | ORAL | 0 refills | Status: DC

## 2020-07-25 NOTE — ED Notes (Signed)
Assumed care of pt upon being roomed. Troponin obtained and sent. Pt denies pain or sob at this time. AO x4. Talking in full sentences with regular and unlabored breathing. Call bell within reach, side rails up x1

## 2020-07-25 NOTE — ED Provider Notes (Signed)
[facility] Emergency Department Provider Note  Time seen: 3:24 AM  I have reviewed the triage vital signs and the nursing notes.   HISTORY  Chief Complaint Weakness   HPI Lance Green is a 44 y.o. male with a past medical history of alcohol abuse, neuropathy, seizures, presents to the emergency department for generalized weakness.  According to the patient for the past year or so he has been experiencing generalized weakness due to neuropathy.  Also states he is trying to stop drinking alcohol but states last time he tried 2 months ago he had a seizure.  Patient states he is used Librium in the past with good success.  Patient states one drink earlier today.  Denies any fever cough.  No chest pain or abdominal pain.  Did state one episode of vomiting yesterday.  Past Medical History:  Diagnosis Date  . Alcohol abuse   . Neuropathy   . Pancreatitis   . Peptic ulcer disease     Patient Active Problem List   Diagnosis Date Noted  . Seizure (HCC) 11/21/2019  . Alcohol abuse 11/21/2019  . Hypomagnesemia 11/21/2019  . Abscess of left middle finger 11/10/2018  . Acute appendicitis with localized peritonitis 07/06/2018    Past Surgical History:  Procedure Laterality Date  . APPENDECTOMY    . INCISION AND DRAINAGE Left 11/11/2018   Procedure: INCISION AND DRAINAGE;  Surgeon: Donato Heinz, MD;  Location: [facility] ORS;  Service: Orthopedics;  Laterality: Left;  . LAPAROSCOPIC APPENDECTOMY N/A 07/06/2018   Procedure: APPENDECTOMY LAPAROSCOPIC;  Surgeon: Carolan Shiver, MD;  Location: [facility] ORS;  Service: General;  Laterality: N/A;    Prior to Admission medications   Medication Sig Start Date End Date Taking? Authorizing Provider  amLODipine (NORVASC) 2.5 MG tablet Take 1 tablet (2.5 mg total) by mouth daily. 11/29/19   Lynn Ito, MD  chlordiazePOXIDE (LIBRIUM) 25 MG capsule Take 2 capsules (50 mg total) by mouth 3 (three) times daily as needed for  withdrawal. 01/23/20   Dionne Bucy, MD  folic acid (FOLVITE) 1 MG tablet Take 1 tablet (1 mg total) by mouth daily. 11/29/19   Lynn Ito, MD  levETIRAcetam (KEPPRA) 250 MG tablet Take 1 tablet (250 mg total) by mouth 2 (two) times daily. 11/28/19   Lynn Ito, MD  Multiple Vitamin (MULTIVITAMIN WITH MINERALS) TABS tablet Take 1 tablet by mouth daily. 11/29/19   Lynn Ito, MD  thiamine 100 MG tablet Take 1 tablet (100 mg total) by mouth daily. 11/28/19   Lynn Ito, MD    No Known Allergies  No family history on file.  Social History Social History   Tobacco Use  . Smoking status: Current Every Day Smoker    Packs/day: 0.50    Types: Cigarettes  . Smokeless tobacco: Never Used  Vaping Use  . Vaping Use: Never used  Substance Use Topics  . Alcohol use: Yes    Comment: daily 40 oz, reports 4 40oz/day  . Drug use: No    Review of Systems Constitutional: Negative for fever Cardiovascular: Negative for chest pain. Respiratory: Negative for shortness of breath. Gastrointestinal: Negative for abdominal pain.  1 episode of vomiting yesterday. Genitourinary: Negative for urinary compaints Musculoskeletal: Negative for musculoskeletal complaints Neurological: Negative for headache All other ROS negative  ____________________________________________   PHYSICAL EXAM:  VITAL SIGNS: ED Triage Vitals  Enc Vitals Group     BP 07/24/20 2008 128/87     Pulse Rate 07/24/20 2008 86  Resp 07/24/20 2008 20     Temp 07/24/20 2008 98.6 F (37 C)     Temp Source 07/24/20 2008 Oral     SpO2 07/24/20 1954 99 %     Weight 07/24/20 2009 155 lb (70.3 kg)     Height 07/24/20 2009 5\' 8"  (1.727 m)     Head Circumference --      Peak Flow --      Pain Score 07/24/20 2009 0     Pain Loc --      Pain Edu? --      Excl. in GC? --    Constitutional: Alert and oriented. Well appearing and in no distress. Eyes: Normal exam ENT      Head: Normocephalic and atraumatic.       Mouth/Throat: Mucous membranes are moist. Cardiovascular: Normal rate, regular rhythm.  Respiratory: Normal respiratory effort without tachypnea nor retractions. Breath sounds are clear Gastrointestinal: Soft and nontender. No distention.   Musculoskeletal: Nontender with normal range of motion in all extremities Neurologic:  Normal speech and language. No gross focal neurologic deficits  Skin:  Skin is warm.  Dry appearing skin. Psychiatric: Mood and affect are normal. Speech and behavior are normal.   ____________________________________________    EKG  EKG viewed and interpreted by myself shows a sinus rhythm at 69 bpm with a narrow QRS, right axis deviation, largely normal intervals with no concerning ST changes.  ____________________________________________   INITIAL IMPRESSION / ASSESSMENT AND PLAN / ED COURSE  Pertinent labs & imaging results that were available during my care of the patient were reviewed by me and considered in my medical decision making (see chart for details).   Patient presents to the emergency department for generalized weakness, neuropathy, symptoms have been ongoing for over a year per patient.  Also is interested in stopping alcohol use but states 2 months ago he had a seizure when he tried to stop.  Has used Librium in the past with good success.  Overall the patient appears well, no acute findings on physical exam.  Patient appears to have good strength.  Reassuring vitals.  Lab work is largely at the patient's baseline besides a slightly elevated troponin.  We will recheck the troponin as a precaution.  Patient denies any chest pain.  We will also obtain an EKG.  If the patient's work-up remains reassuring anticipate likely discharge home.  I had a long discussion with the patient regarding Librium and avoiding alcohol use, patient wishes to try the Librium taper, states he will not drink alcohol and if he does start drinking alcohol he will discontinue use  of Librium.  We will refer to outpatient resources as well.  Patient's work-up is essentially negative.  Repeat troponin remains normal.  We will discharge with a Librium taper.  Patient agreeable to plan of care.  07/26/20 was evaluated in Emergency Department on 07/25/2020 for the symptoms described in the history of present illness. He was evaluated in the context of the global COVID-19 pandemic, which necessitated consideration that the patient might be at risk for infection with the SARS-CoV-2 virus that causes COVID-19. Institutional protocols and algorithms that pertain to the evaluation of patients at risk for COVID-19 are in a state of rapid change based on information released by regulatory bodies including the CDC and federal and state organizations. These policies and algorithms were followed during the patient's care in the ED.  ____________________________________________   FINAL CLINICAL IMPRESSION(S) / ED DIAGNOSES  Alcohol use disorder Weakness   Minna Antis, MD 07/25/20 [phone]

## 2020-09-04 ENCOUNTER — Emergency Department
Admission: EM | Admit: 2020-09-04 | Discharge: 2020-09-04 | Disposition: A | Discharge status: Home / Self Care | Payer: Self-pay | Attending: Emergency Medicine | Admitting: Emergency Medicine

## 2020-09-04 ENCOUNTER — Other Ambulatory Visit: Payer: Self-pay

## 2020-09-04 DIAGNOSIS — F17210 Nicotine dependence, cigarettes, uncomplicated: Secondary | ICD-10-CM | POA: Insufficient documentation

## 2020-09-04 DIAGNOSIS — R569 Unspecified convulsions: Secondary | ICD-10-CM | POA: Insufficient documentation

## 2020-09-04 DIAGNOSIS — M791 Myalgia, unspecified site: Secondary | ICD-10-CM | POA: Insufficient documentation

## 2020-09-04 LAB — BASIC METABOLIC PANEL
Anion gap: 22 — ABNORMAL HIGH (ref 5–15)
BUN: <5 mg/dL — ABNORMAL LOW (ref 6–20)
CO2: 18 mmol/L — ABNORMAL LOW (ref 22–32)
Calcium: 8.1 mg/dL — ABNORMAL LOW (ref 8.9–10.3)
Chloride: 98 mmol/L (ref 98–111)
Creatinine, Ser: 0.75 mg/dL (ref 0.61–1.24)
GFR, Estimated: >60 mL/min (ref >60)
Glucose, Bld: 141 mg/dL — ABNORMAL HIGH (ref 70–99)
Potassium: 3.4 mmol/L — ABNORMAL LOW (ref 3.5–5.1)
Sodium: 138 mmol/L (ref 135–145)

## 2020-09-04 LAB — CBC WITH DIFFERENTIAL/PLATELET
Abs Immature Granulocytes: 0.04 10*3/uL (ref 0.00–0.07)
Basophils Absolute: 0.1 10*3/uL (ref 0.0–0.1)
Basophils Relative: 1 %
Eosinophils Absolute: 0.1 10*3/uL (ref 0.0–0.5)
Eosinophils Relative: 2 %
HCT: 33 % — ABNORMAL LOW (ref 39.0–52.0)
Hemoglobin: 11.3 g/dL — ABNORMAL LOW (ref 13.0–17.0)
Immature Granulocytes: 1 %
Lymphocytes Relative: 24 %
Lymphs Abs: 1.4 10*3/uL (ref 0.7–4.0)
MCH: 32.1 pg (ref 26.0–34.0)
MCHC: 34.2 g/dL (ref 30.0–36.0)
MCV: 93.8 fL (ref 80.0–100.0)
Monocytes Absolute: 0.8 10*3/uL (ref 0.1–1.0)
Monocytes Relative: 13 %
Neutro Abs: 3.6 10*3/uL (ref 1.7–7.7)
Neutrophils Relative %: 59 %
Platelets: 123 10*3/uL — ABNORMAL LOW (ref 150–400)
RBC: 3.52 MIL/uL — ABNORMAL LOW (ref 4.22–5.81)
RDW: 13.5 % (ref 11.5–15.5)
WBC: 6 10*3/uL (ref 4.0–10.5)
nRBC: 0 % (ref 0.0–0.2)

## 2020-09-04 MED ORDER — LEVETIRACETAM 250 MG PO TABS: 250.0000 mg | ORAL_TABLET | Freq: Two times a day (BID) | ORAL | 0 refills | Status: DC

## 2020-09-04 MED ORDER — LEVETIRACETAM IN NACL 1000 MG/100ML IV SOLN
1000.0000 mg | Freq: Once | INTRAVENOUS | Status: AC
  Administered 2020-09-04: 1000 mg via INTRAVENOUS
  Filled 2020-09-04: qty 100

## 2020-09-04 NOTE — ED Triage Notes (Signed)
Pt presents to the [facility] via EMS from home with c/o multiple seizures. EMS states that pt has hx of seizures but has been out of his medication for several months. Pt's friend witness 4-5 episodes of seizure like activity which prompted her to call EMS. Pt had 1 grand mal seizure en route and was given 2mg  Versed IV

## 2020-09-04 NOTE — ED Provider Notes (Addendum)
[facility] Emergency Department Provider Note   ____________________________________________   First MD Initiated Contact with Patient 09/04/20 1426     (approximate)  I have reviewed the triage vital signs and the nursing notes.   HISTORY  Chief Complaint Seizures    HPI Lance Green is a 44 y.o. male with a stated past medical history of alcohol abuse, recurrent pancreatitis, epilepsy, and peptic ulcer disease who presents for seizure-like activity via EMS.  EMS state patient has a history of seizures and has been out of his medications for several months.  Patient corroborates the story and states that it was because they "did not give me any refills".  Patient's friend stated to EMS that he has had 4-5 episodes of seizure activity since he awoke this morning.  EMS gave patient 2 mg of Versed IV as the patient had one episode of grand mal seizure activity in route.  Patient now only complains of generalized muscle aches         Past Medical History:  Diagnosis Date  . Alcohol abuse   . Neuropathy   . Pancreatitis   . Peptic ulcer disease     Patient Active Problem List   Diagnosis Date Noted  . Seizure (HCC) 11/21/2019  . Alcohol abuse 11/21/2019  . Hypomagnesemia 11/21/2019  . Abscess of left middle finger 11/10/2018  . Acute appendicitis with localized peritonitis 07/06/2018    Past Surgical History:  Procedure Laterality Date  . APPENDECTOMY    . INCISION AND DRAINAGE Left 11/11/2018   Procedure: INCISION AND DRAINAGE;  Surgeon: Donato Heinz, MD;  Location: [facility] ORS;  Service: Orthopedics;  Laterality: Left;  . LAPAROSCOPIC APPENDECTOMY N/A 07/06/2018   Procedure: APPENDECTOMY LAPAROSCOPIC;  Surgeon: Carolan Shiver, MD;  Location: [facility] ORS;  Service: General;  Laterality: N/A;    Prior to Admission medications   Medication Sig Start Date End Date Taking? Authorizing Provider  amLODipine (NORVASC) 2.5 MG tablet Take 1  tablet (2.5 mg total) by mouth daily. 11/29/19   Lynn Ito, MD  chlordiazePOXIDE (LIBRIUM) 10 MG capsule Day 1-2: Take 1 tablet p.o. every 8 hours. Day 3-4: Take 1 tablet p.o. every 12 hours. Day 5-6: Take 1 tablet p.o. daily. Day 7: Stop 07/25/20   Minna Antis, MD  folic acid (FOLVITE) 1 MG tablet Take 1 tablet (1 mg total) by mouth daily. 11/29/19   Lynn Ito, MD  levETIRAcetam (KEPPRA) 250 MG tablet Take 1 tablet (250 mg total) by mouth 2 (two) times daily. 11/28/19   Lynn Ito, MD  levETIRAcetam (KEPPRA) 250 MG tablet Take 1 tablet (250 mg total) by mouth 2 (two) times daily. 09/04/20 11/03/20  Merwyn Katos, MD  Multiple Vitamin (MULTIVITAMIN WITH MINERALS) TABS tablet Take 1 tablet by mouth daily. 11/29/19   Lynn Ito, MD  thiamine 100 MG tablet Take 1 tablet (100 mg total) by mouth daily. 11/28/19   Lynn Ito, MD    Allergies Patient has no known allergies.  No family history on file.  Social History Social History   Tobacco Use  . Smoking status: Current Every Day Smoker    Packs/day: 0.50    Types: Cigarettes  . Smokeless tobacco: Never Used  Vaping Use  . Vaping Use: Never used  Substance Use Topics  . Alcohol use: Yes    Comment: daily 40 oz, reports 4 40oz/day  . Drug use: No    Review of Systems Constitutional: No fever/chills Eyes: No visual changes. ENT:  No sore throat. Cardiovascular: Denies chest pain. Respiratory: Denies shortness of breath. Gastrointestinal: No abdominal pain.  No nausea, no vomiting.  No diarrhea. Genitourinary: Negative for dysuria. Musculoskeletal: Negative for acute arthralgias Skin: Negative for rash. Neurological: Negative for headaches, weakness/numbness/paresthesias in any extremity Psychiatric: Negative for suicidal ideation/homicidal ideation   ____________________________________________   PHYSICAL EXAM:  VITAL SIGNS: ED Triage Vitals  Enc Vitals Group     BP 09/04/20 1420 (!) 142/90     Pulse  Rate 09/04/20 1420 70     Resp 09/04/20 1420 16     Temp 09/04/20 1414 98 F (36.7 C)     Temp Source 09/04/20 1414 Oral     SpO2 09/04/20 1420 98 %     Weight 09/04/20 1412 145 lb (65.8 kg)     Height 09/04/20 1412 5\' 8"  (1.727 m)     Head Circumference --      Peak Flow --      Pain Score 09/04/20 1411 0     Pain Loc --      Pain Edu? --      Excl. in GC? --    Constitutional: Alert and oriented. Well appearing and in no acute distress. Eyes: Conjunctivae are normal. PERRL. Head: Atraumatic. Nose: No congestion/rhinnorhea. Mouth/Throat: Mucous membranes are moist. Neck: No stridor Cardiovascular: Grossly normal heart sounds.  Good peripheral circulation. Respiratory: Normal respiratory effort.  No retractions. Gastrointestinal: Soft and nontender. No distention. Musculoskeletal: No obvious deformities Neurologic:  Normal speech and language. No gross focal neurologic deficits are appreciated. Skin:  Skin is warm and dry. No rash noted. Psychiatric: Mood and affect are normal. Speech and behavior are normal.  ____________________________________________   LABS (all labs ordered are listed, but only abnormal results are displayed)  Labs Reviewed  BASIC METABOLIC PANEL - Abnormal; Notable for the following components:      Result Value   Potassium 3.4 (*)    CO2 18 (*)    Glucose, Bld 141 (*)    BUN <5 (*)    Calcium 8.1 (*)    Anion gap 22 (*)    All other components within normal limits  CBC WITH DIFFERENTIAL/PLATELET - Abnormal; Notable for the following components:   RBC 3.52 (*)    Hemoglobin 11.3 (*)    HCT 33.0 (*)    Platelets 123 (*)    All other components within normal limits  CBG MONITORING, ED   PROCEDURES  Procedure(s) performed (including Critical Care):  .1-3 Lead EKG Interpretation Performed by: 09/06/20, MD Authorized by: Merwyn Katos, MD     Interpretation: normal     ECG rate:  88   ECG rate assessment: normal     Rhythm:  sinus rhythm     Ectopy: none     Conduction: normal       ____________________________________________   INITIAL IMPRESSION / ASSESSMENT AND PLAN / ED COURSE  As part of my medical decision making, I reviewed the following data within the electronic MEDICAL RECORD NUMBER Nursing notes reviewed and incorporated, Labs reviewed, Old chart reviewed, and Notes from prior ED visits reviewed and incorporated        Patient presents after recent seizure episode.  Patient had slow return to baseline mental and physical function per EMS. No immunosuppresion hx and had no preceding fever. No history of alcohol abuse or suspicion for toxin ingestion. Unlikely stroke, syncope. Unlikely infectious etiology. No preceding trauma. Medication nonadherence Workup: EKG, BMP, POCT glucose (pregnancy  test if male) and CT Brain.  Field Interventions: 2 mg Versed ED Interventions: 1 g Keppra Disposition: Discharge home with primary care follow up in next 24-48 hours.      ____________________________________________   FINAL CLINICAL IMPRESSION(S) / ED DIAGNOSES  Final diagnoses:  Seizure [facility])     ED Discharge Orders         Ordered    levETIRAcetam (KEPPRA) 250 MG tablet  2 times daily        09/04/20 1436           Note:  This document was prepared using Dragon voice recognition software and may include unintentional dictation errors.   Merwyn Katos, MD 09/04/20 [id number]    Merwyn Katos, MD 09/04/20 [phone]

## 2021-02-20 ENCOUNTER — Emergency Department: Payer: Self-pay

## 2021-02-20 ENCOUNTER — Inpatient Hospital Stay
Admission: EM | Admit: 2021-02-20 | Discharge: 2021-02-22 | DRG: 871 | Disposition: A | Discharge status: Home / Self Care | Payer: Self-pay | Attending: Internal Medicine | Admitting: Internal Medicine

## 2021-02-20 ENCOUNTER — Other Ambulatory Visit: Payer: Self-pay

## 2021-02-20 DIAGNOSIS — J690 Pneumonitis due to inhalation of food and vomit: Secondary | ICD-10-CM | POA: Diagnosis present

## 2021-02-20 DIAGNOSIS — G629 Polyneuropathy, unspecified: Secondary | ICD-10-CM | POA: Diagnosis present

## 2021-02-20 DIAGNOSIS — F17210 Nicotine dependence, cigarettes, uncomplicated: Secondary | ICD-10-CM | POA: Diagnosis present

## 2021-02-20 DIAGNOSIS — F1010 Alcohol abuse, uncomplicated: Secondary | ICD-10-CM | POA: Diagnosis present

## 2021-02-20 DIAGNOSIS — J189 Pneumonia, unspecified organism: Secondary | ICD-10-CM

## 2021-02-20 DIAGNOSIS — R569 Unspecified convulsions: Secondary | ICD-10-CM

## 2021-02-20 DIAGNOSIS — A419 Sepsis, unspecified organism: Principal | ICD-10-CM | POA: Diagnosis present

## 2021-02-20 DIAGNOSIS — E871 Hypo-osmolality and hyponatremia: Secondary | ICD-10-CM | POA: Diagnosis present

## 2021-02-20 DIAGNOSIS — J188 Other pneumonia, unspecified organism: Secondary | ICD-10-CM | POA: Diagnosis present

## 2021-02-20 DIAGNOSIS — R6520 Severe sepsis without septic shock: Secondary | ICD-10-CM | POA: Diagnosis present

## 2021-02-20 DIAGNOSIS — G40909 Epilepsy, unspecified, not intractable, without status epilepticus: Secondary | ICD-10-CM | POA: Diagnosis present

## 2021-02-20 DIAGNOSIS — Z8711 Personal history of peptic ulcer disease: Secondary | ICD-10-CM

## 2021-02-20 DIAGNOSIS — Z79899 Other long term (current) drug therapy: Secondary | ICD-10-CM

## 2021-02-20 DIAGNOSIS — J850 Gangrene and necrosis of lung: Secondary | ICD-10-CM | POA: Diagnosis present

## 2021-02-20 DIAGNOSIS — Z20822 Contact with and (suspected) exposure to covid-19: Secondary | ICD-10-CM | POA: Diagnosis present

## 2021-02-20 DIAGNOSIS — I10 Essential (primary) hypertension: Secondary | ICD-10-CM | POA: Diagnosis present

## 2021-02-20 DIAGNOSIS — D649 Anemia, unspecified: Secondary | ICD-10-CM | POA: Diagnosis present

## 2021-02-20 DIAGNOSIS — J984 Other disorders of lung: Secondary | ICD-10-CM

## 2021-02-20 DIAGNOSIS — Z9049 Acquired absence of other specified parts of digestive tract: Secondary | ICD-10-CM

## 2021-02-20 HISTORY — DX: Essential (primary) hypertension: I10

## 2021-02-20 LAB — BASIC METABOLIC PANEL
Anion gap: 12 (ref 5–15)
BUN: <5 mg/dL — ABNORMAL LOW (ref 6–20)
CO2: 24 mmol/L (ref 22–32)
Calcium: 8.5 mg/dL — ABNORMAL LOW (ref 8.9–10.3)
Chloride: 98 mmol/L (ref 98–111)
Creatinine, Ser: 0.78 mg/dL (ref 0.61–1.24)
GFR, Estimated: >60 mL/min (ref >60)
Glucose, Bld: 104 mg/dL — ABNORMAL HIGH (ref 70–99)
Potassium: 3.8 mmol/L (ref 3.5–5.1)
Sodium: 134 mmol/L — ABNORMAL LOW (ref 135–145)

## 2021-02-20 LAB — CBC
HCT: 28.2 % — ABNORMAL LOW (ref 39.0–52.0)
Hemoglobin: 10 g/dL — ABNORMAL LOW (ref 13.0–17.0)
MCH: 31.3 pg (ref 26.0–34.0)
MCHC: 35.5 g/dL (ref 30.0–36.0)
MCV: 88.1 fL (ref 80.0–100.0)
Platelets: 288 10*3/uL (ref 150–400)
RBC: 3.2 MIL/uL — ABNORMAL LOW (ref 4.22–5.81)
RDW: 12.9 % (ref 11.5–15.5)
WBC: 11.2 10*3/uL — ABNORMAL HIGH (ref 4.0–10.5)
nRBC: 0 % (ref 0.0–0.2)

## 2021-02-20 LAB — PROCALCITONIN: Procalcitonin: <0.1 ng/mL

## 2021-02-20 LAB — RESP PANEL BY RT-PCR (FLU A&B, COVID) ARPGX2
Influenza A by PCR: NEGATIVE
Influenza B by PCR: NEGATIVE
SARS Coronavirus 2 by RT PCR: NEGATIVE

## 2021-02-20 LAB — MAGNESIUM: Magnesium: 1.3 mg/dL — ABNORMAL LOW (ref 1.7–2.4)

## 2021-02-20 LAB — TROPONIN I (HIGH SENSITIVITY): Troponin I (High Sensitivity): 12 ng/L (ref <18)

## 2021-02-20 LAB — LACTIC ACID, PLASMA: Lactic Acid, Venous: 1.5 mmol/L (ref 0.5–1.9)

## 2021-02-20 IMAGING — CR DG CHEST 2V
1 series · 2 of 2 positions shown · non-contrast
Comparison: [DATE]

CLINICAL DATA: Left-sided chest pain and cough beginning 2 weeks
ago

EXAM:
CHEST - 2 VIEW

[Series 1: dg chest 2 view · 0.14mm/px · 2 of 2 slices shown]
[im 1/2]
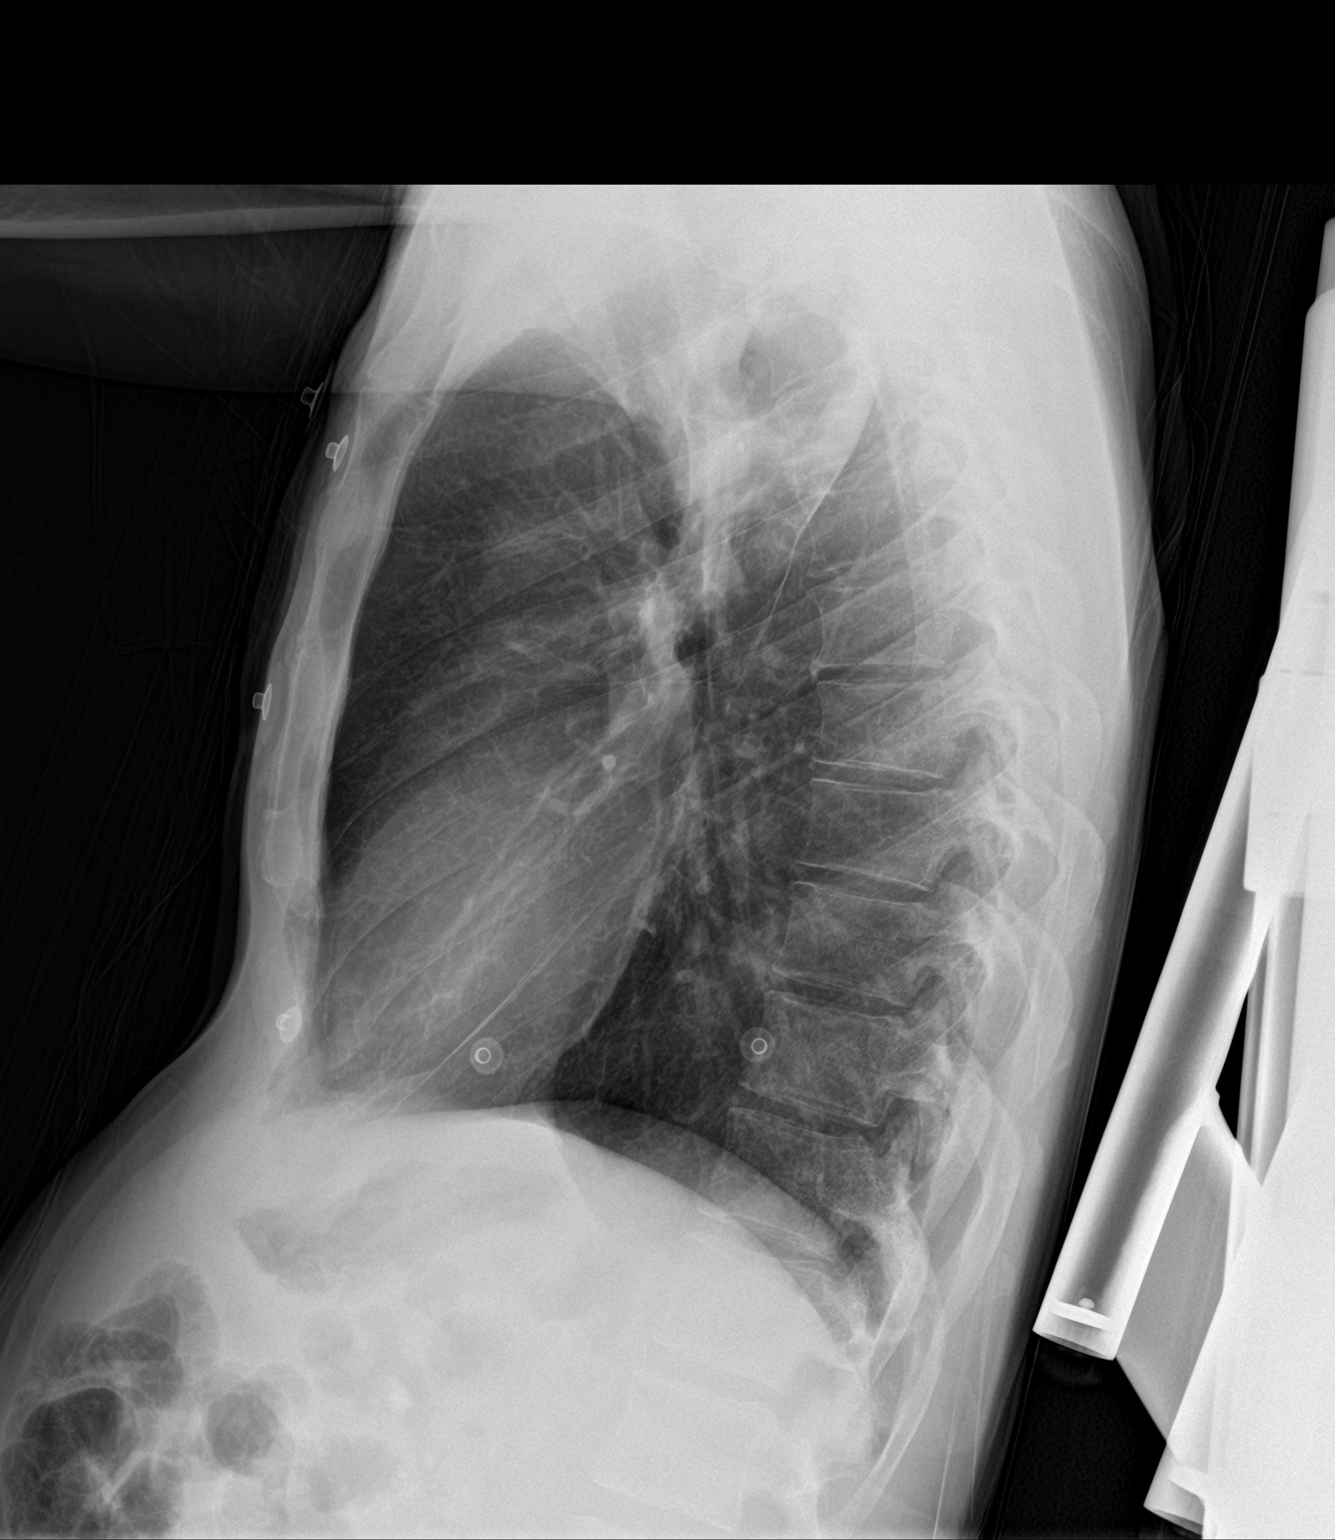
[im 2/2]
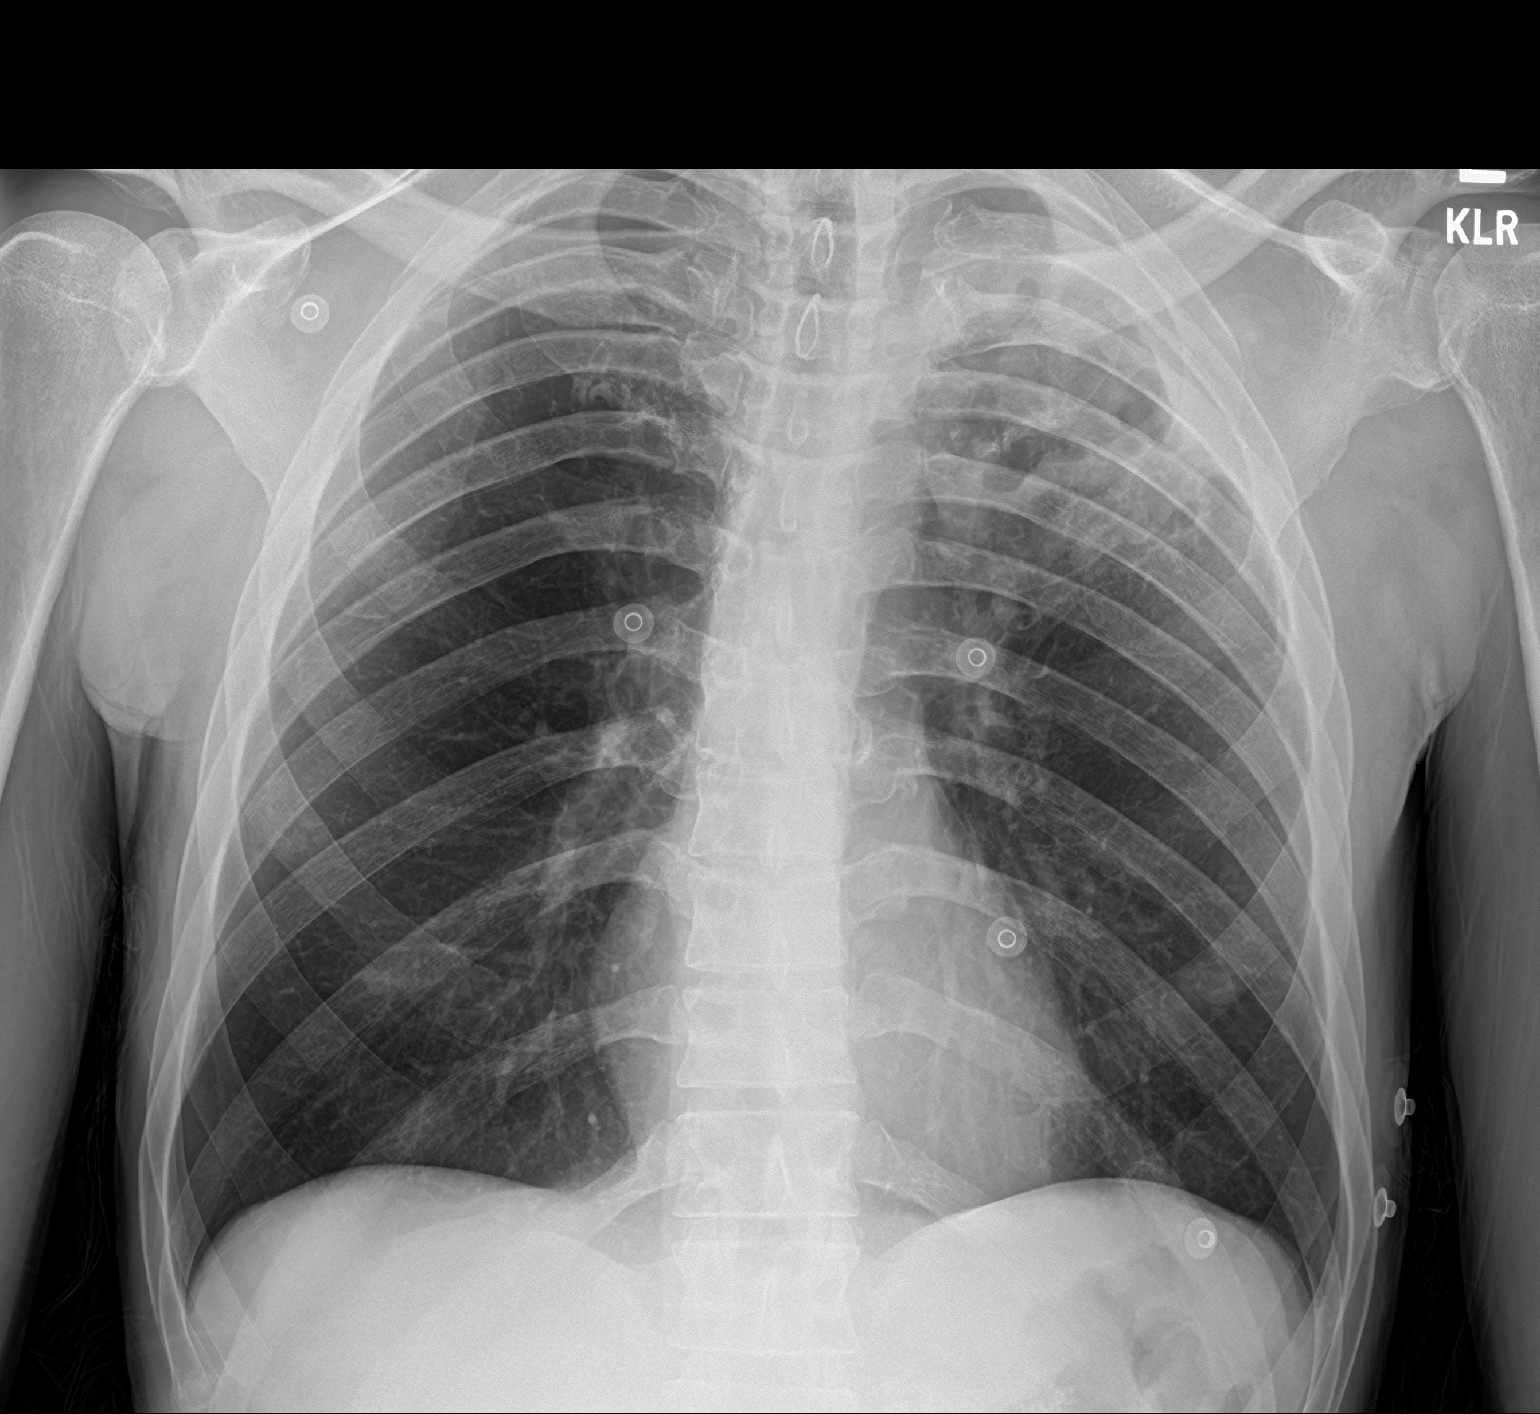

[2 of 2 positions shown; findings below may reference images not displayed]

FINDINGS: Focal opacity noted in the left upper lobe with interspersed regions
of lucency. Lungs otherwise clear.

Cardiomediastinal silhouette and pulmonary vasculature are within
normal limits.
IMPRESSION: Airspace opacity in the left upper lobe suspicious for cavitary
pneumonia. Consider further evaluation with contrast enhanced chest
CT. Radiographic follow-up to resolution should be performed to
exclude malignancy.

## 2021-02-20 IMAGING — CT CT CHEST W/ CM
2 of 3 series · 15 of 36 positions shown, 18 images · IV contrast (omnipaque)
Comparison: No prior chest CT.  Chest x-ray [DATE].

CLINICAL DATA: 44-year-old male with history of pneumonia. Abnormal
chest x-ray.

EXAM:
CT CHEST WITH CONTRAST
TECHNIQUE: Multidetector CT imaging of the chest was performed during
intravenous contrast administration.
CONTRAST:  75mL OMNIPAQUE IOHEXOL 300 MG/ML  SOLN

[Series 2: axial st · axial · 0.67mm/px · z∈[-664,-352]mm · 12 of 184 slices shown, 15 images]
[im 14/184  mediastinal]
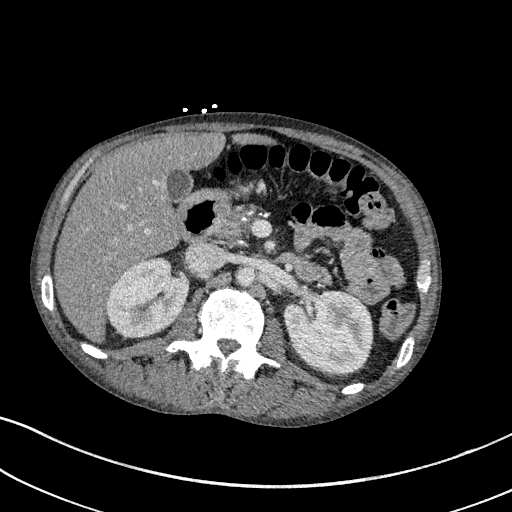
[im 14/184  lung]
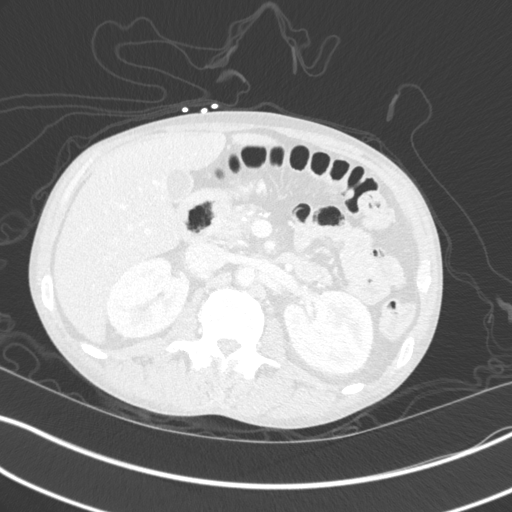
[im 28/184  lung]
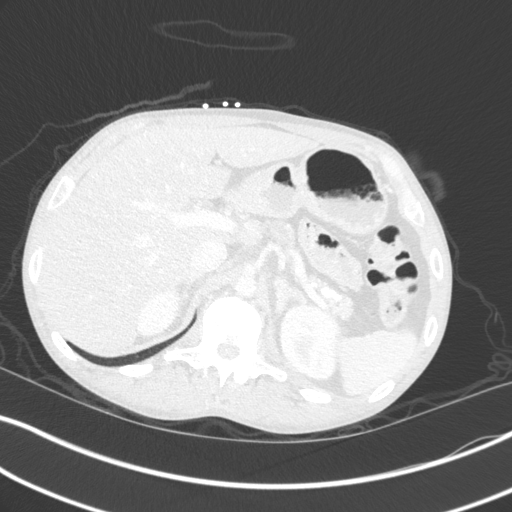
[im 41/184  lung]
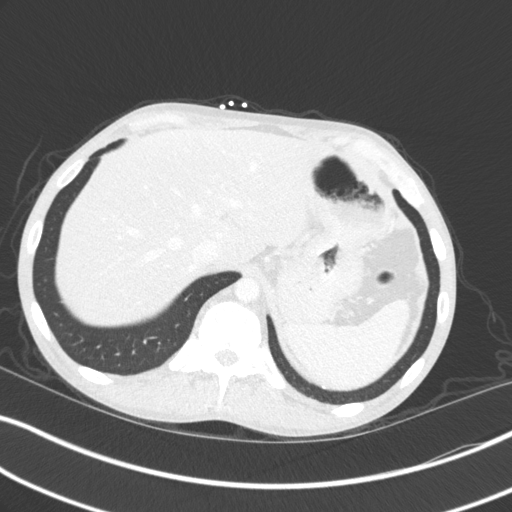
[im 55/184  lung]
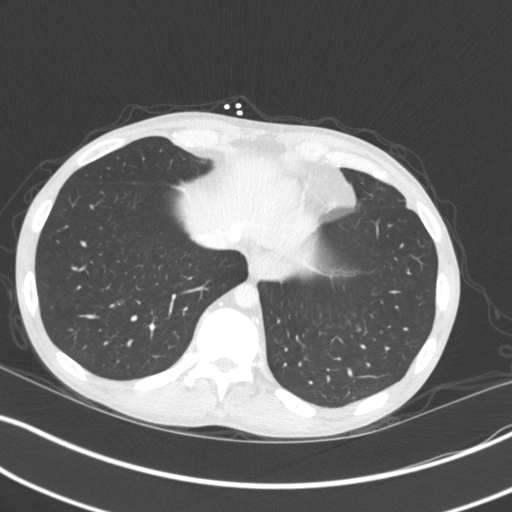
[im 68/184  mediastinal]
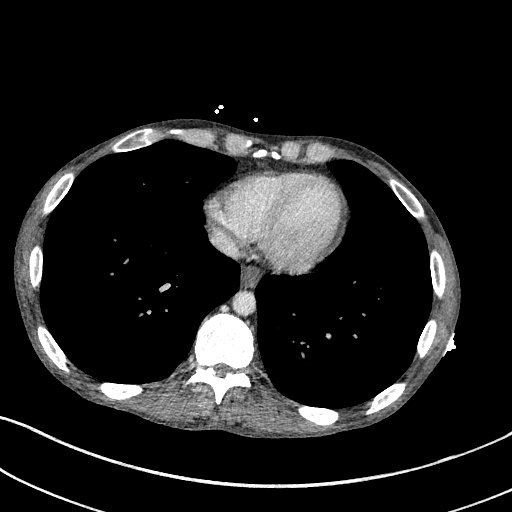
[im 68/184  lung]
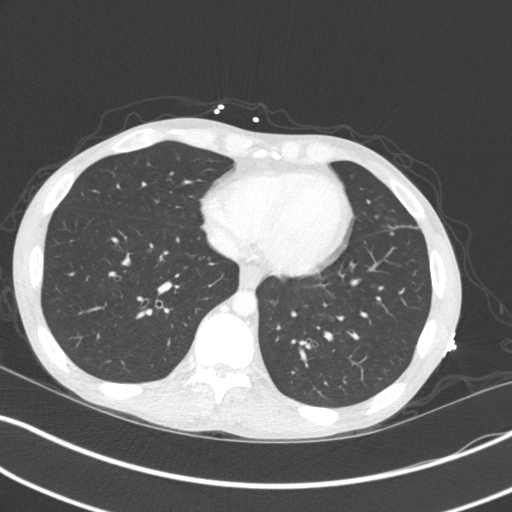
[im 82/184  lung]
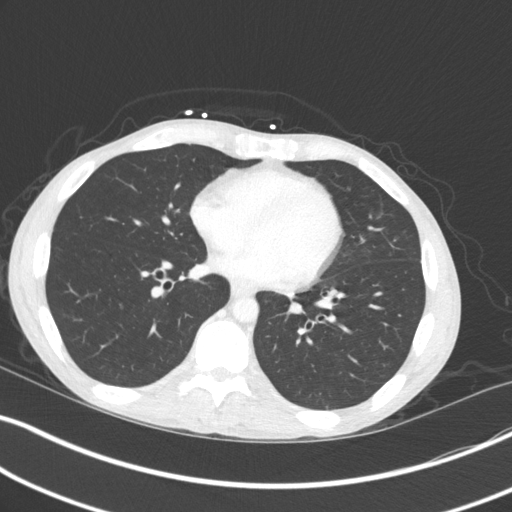
[im 102/184  lung]
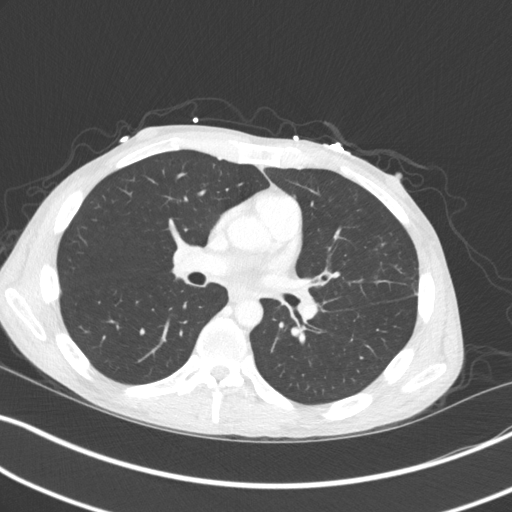
[im 116/184  lung]
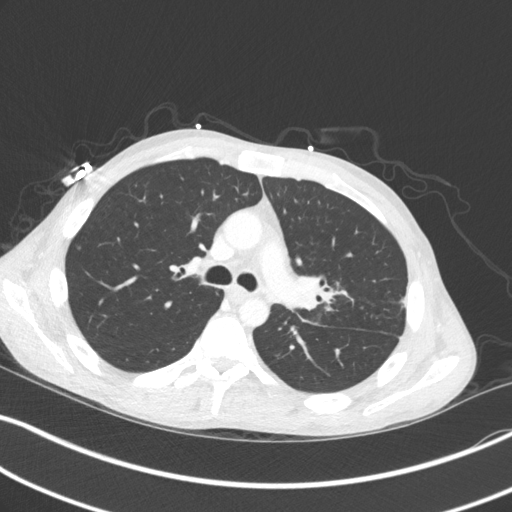
[im 129/184  mediastinal]
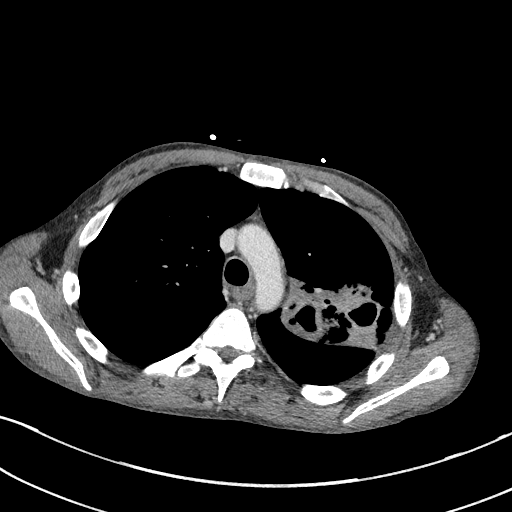
[im 129/184  lung]
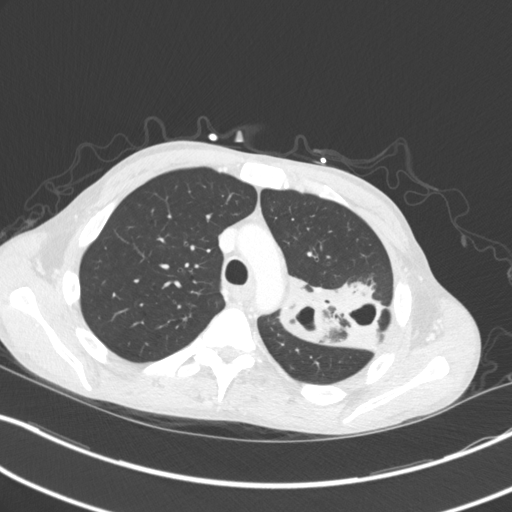
[im 143/184  lung]
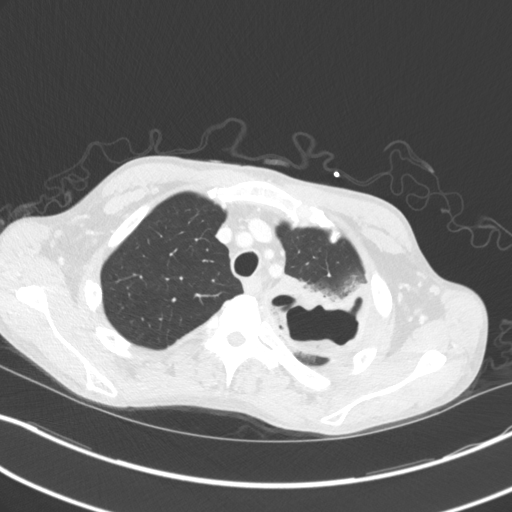
[im 156/184  lung]
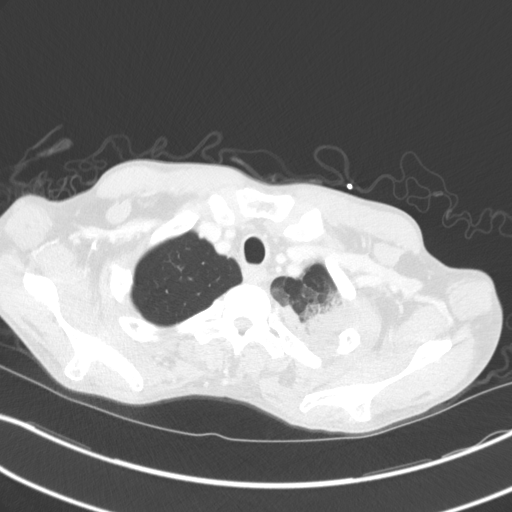
[im 170/184  lung]
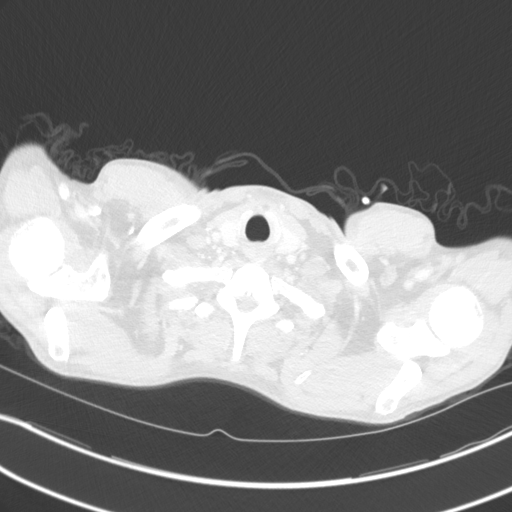

[Series 5: coronal · coronal · 0.63mm/px · 3 of 103 slices shown]
[im 21/103  lung]
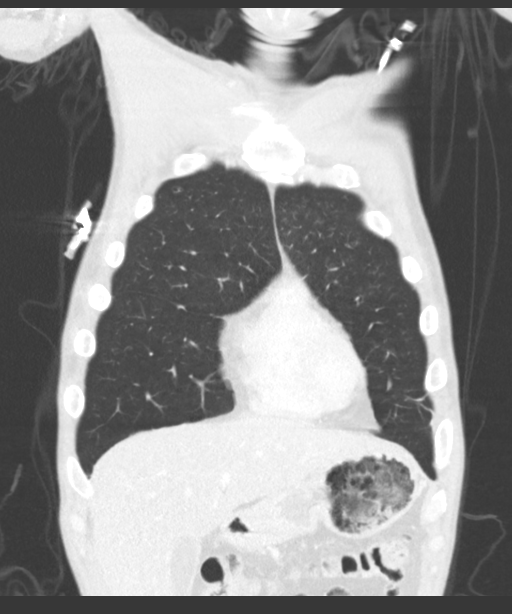
[im 41/103  lung]
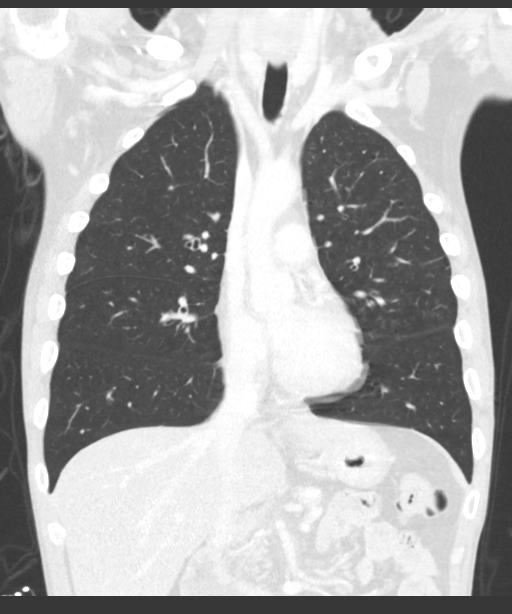
[im 62/103  lung]
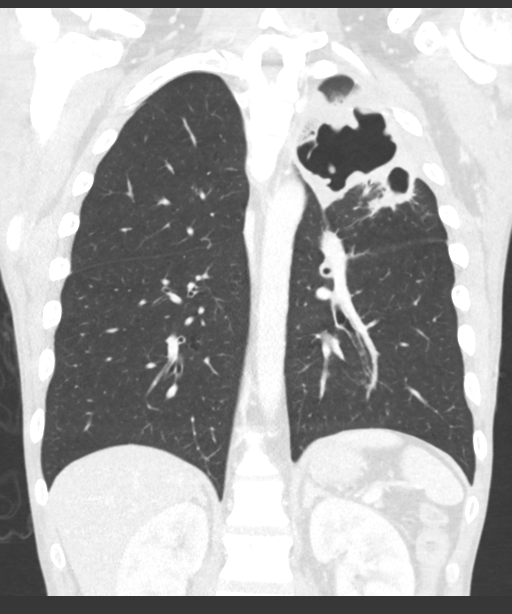

[15 of 36 positions shown; findings below may reference images not displayed]

FINDINGS: Cardiovascular: Heart size is normal. There is no significant
pericardial fluid, thickening or pericardial calcification. No
atherosclerotic calcifications in the thoracic aorta or the coronary
arteries.

Mediastinum/Nodes: No pathologically enlarged mediastinal or hilar
lymph nodes. Esophagus is unremarkable in appearance. No axillary
lymphadenopathy.

Lungs/Pleura: Large area of cavitation with thick walls in the
posterior aspect of the left upper lobe near the apex with
surrounding ground-glass attenuation, septal thickening and regional
architectural distortion. Right lung is clear. No pleural effusions.

Upper Abdomen: Aortic atherosclerosis. Diffuse low attenuation
throughout the visualized hepatic parenchyma, indicative of hepatic
steatosis. Morphologic changes in the pancreas indicative of chronic
pancreatitis, including extensive coarse parenchymal calcifications,
generalized parenchymal atrophy and diffuse ductal dilatation.

Musculoskeletal: There are no aggressive appearing lytic or blastic
lesions noted in the visualized portions of the skeleton.
IMPRESSION: 1. Probable necrotizing cavitary pneumonia in the left upper lobe,
as above. Strictly speaking, cavitary neoplasm is not entirely
excluded, but not strongly favored at this time. Follow-up
noncontrast chest CT is recommended in 2-3 months to re-evaluate
this region after trial of antimicrobial therapy.
2. Morphologic changes in the pancreas indicative of chronic
pancreatitis.
3. Hepatic steatosis.

## 2021-02-20 MED ORDER — ENOXAPARIN SODIUM 40 MG/0.4ML IJ SOSY
40.0000 mg | PREFILLED_SYRINGE | INTRAMUSCULAR | Status: DC
  Filled 2021-02-20: qty 0.4

## 2021-02-20 MED ORDER — VANCOMYCIN HCL 1500 MG/300ML IV SOLN
1500.0000 mg | Freq: Once | INTRAVENOUS | Status: AC
  Administered 2021-02-20: 1500 mg via INTRAVENOUS
  Filled 2021-02-20: qty 300

## 2021-02-20 MED ORDER — THIAMINE HCL 100 MG PO TABS
100.0000 mg | ORAL_TABLET | Freq: Every day | ORAL | Status: DC
  Administered 2021-02-20 – 2021-02-22 (×2): 100 mg via ORAL
  Filled 2021-02-20 (×3): qty 1

## 2021-02-20 MED ORDER — FOLIC ACID 1 MG PO TABS
1.0000 mg | ORAL_TABLET | Freq: Every day | ORAL | Status: DC
  Administered 2021-02-20 – 2021-02-22 (×3): 1 mg via ORAL
  Filled 2021-02-20 (×3): qty 1

## 2021-02-20 MED ORDER — HYDRALAZINE HCL 20 MG/ML IJ SOLN: 5.0000 mg | INTRAMUSCULAR | Status: DC | PRN

## 2021-02-20 MED ORDER — LORAZEPAM 2 MG/ML IJ SOLN: 0.0000 mg | Freq: Four times a day (QID) | INTRAMUSCULAR | Status: AC

## 2021-02-20 MED ORDER — SODIUM CHLORIDE 0.9 % IV BOLUS
1000.0000 mL | Freq: Once | INTRAVENOUS | Status: AC
  Administered 2021-02-20: 1000 mL via INTRAVENOUS

## 2021-02-20 MED ORDER — THIAMINE HCL 100 MG/ML IJ SOLN
100.0000 mg | Freq: Every day | INTRAMUSCULAR | Status: DC
  Administered 2021-02-21: 100 mg via INTRAVENOUS
  Filled 2021-02-20 (×2): qty 2

## 2021-02-20 MED ORDER — DM-GUAIFENESIN ER 30-600 MG PO TB12
1.0000 | ORAL_TABLET | Freq: Two times a day (BID) | ORAL | Status: DC | PRN
  Filled 2021-02-20: qty 1

## 2021-02-20 MED ORDER — NICOTINE 21 MG/24HR TD PT24
21.0000 mg | MEDICATED_PATCH | Freq: Every day | TRANSDERMAL | Status: DC
  Administered 2021-02-20 – 2021-02-22 (×3): 21 mg via TRANSDERMAL
  Filled 2021-02-20 (×3): qty 1

## 2021-02-20 MED ORDER — ADULT MULTIVITAMIN W/MINERALS CH
1.0000 | ORAL_TABLET | Freq: Every day | ORAL | Status: DC
  Administered 2021-02-20 – 2021-02-22 (×3): 1 via ORAL
  Filled 2021-02-20 (×3): qty 1

## 2021-02-20 MED ORDER — LORAZEPAM 2 MG/ML IJ SOLN: 0.0000 mg | Freq: Two times a day (BID) | INTRAMUSCULAR | Status: DC

## 2021-02-20 MED ORDER — CLINDAMYCIN PHOSPHATE 600 MG/50ML IV SOLN
600.0000 mg | Freq: Three times a day (TID) | INTRAVENOUS | Status: DC
  Administered 2021-02-20: 600 mg via INTRAVENOUS
  Filled 2021-02-20 (×4): qty 50

## 2021-02-20 MED ORDER — LEVETIRACETAM 250 MG PO TABS
250.0000 mg | ORAL_TABLET | Freq: Two times a day (BID) | ORAL | Status: DC
  Administered 2021-02-20 – 2021-02-22 (×4): 250 mg via ORAL
  Filled 2021-02-20 (×5): qty 1

## 2021-02-20 MED ORDER — LORAZEPAM 1 MG PO TABS: 1.0000 mg | ORAL_TABLET | ORAL | Status: DC | PRN

## 2021-02-20 MED ORDER — VANCOMYCIN HCL IN DEXTROSE 1-5 GM/200ML-% IV SOLN
1000.0000 mg | Freq: Two times a day (BID) | INTRAVENOUS | Status: DC
  Filled 2021-02-20 (×2): qty 200

## 2021-02-20 MED ORDER — ACETAMINOPHEN 500 MG PO TABS
1000.0000 mg | ORAL_TABLET | Freq: Once | ORAL | Status: AC
  Administered 2021-02-20: 1000 mg via ORAL
  Filled 2021-02-20: qty 2

## 2021-02-20 MED ORDER — ACETAMINOPHEN 325 MG PO TABS: 650.0000 mg | ORAL_TABLET | Freq: Four times a day (QID) | ORAL | Status: DC | PRN

## 2021-02-20 MED ORDER — LORAZEPAM 2 MG/ML IJ SOLN: 1.0000 mg | INTRAMUSCULAR | Status: DC | PRN

## 2021-02-20 MED ORDER — VANCOMYCIN HCL 1500 MG/300ML IV SOLN
1500.0000 mg | Freq: Once | INTRAVENOUS | Status: DC
  Filled 2021-02-20: qty 300

## 2021-02-20 MED ORDER — ONDANSETRON HCL 4 MG/2ML IJ SOLN: 4.0000 mg | Freq: Three times a day (TID) | INTRAMUSCULAR | Status: DC | PRN

## 2021-02-20 MED ORDER — IOHEXOL 300 MG/ML  SOLN
75.0000 mL | Freq: Once | INTRAMUSCULAR | Status: AC | PRN
  Administered 2021-02-20: 75 mL via INTRAVENOUS

## 2021-02-20 MED ORDER — VANCOMYCIN HCL IN DEXTROSE 1-5 GM/200ML-% IV SOLN: 1000.0000 mg | Freq: Two times a day (BID) | INTRAVENOUS | Status: DC

## 2021-02-20 MED ORDER — LORAZEPAM 2 MG/ML IJ SOLN: 2.0000 mg | INTRAMUSCULAR | Status: DC | PRN

## 2021-02-20 MED ORDER — SODIUM CHLORIDE 0.9 % IV SOLN
3.0000 g | Freq: Four times a day (QID) | INTRAVENOUS | Status: DC
  Administered 2021-02-20 – 2021-02-22 (×8): 3 g via INTRAVENOUS
  Filled 2021-02-20 (×12): qty 8

## 2021-02-20 MED ORDER — ALBUTEROL SULFATE HFA 108 (90 BASE) MCG/ACT IN AERS
2.0000 | INHALATION_SPRAY | RESPIRATORY_TRACT | Status: DC | PRN
  Filled 2021-02-20: qty 6.7

## 2021-02-20 NOTE — Progress Notes (Signed)
Pt arrived from ed via transport

## 2021-02-20 NOTE — ED Notes (Signed)
IV team at bedside 

## 2021-02-20 NOTE — Consult Note (Signed)
Infectious Disease     Reason for Consult: Cavitary pneumonia   Referring Physician: Dr Blaine Hamper Date of Admission:  02/20/2021   Principal Problem:   Cavitary pneumonia Active Problems:   Alcohol abuse   Sepsis (Lance Green)   Normocytic anemia   HPI: Lance Green is a 45 y.o. male with a history of multiple medical problems including seizure disorder, neuropathy, alcohol abuse who was presented to the ED with left-sided chest pain over the last 2 weeks.  He denied any shortness of breath but did have a cough. He reports occasional night sweats and a 5-10 pound wt loss over several months. Denies any recent seizures or episodes of LOC or aspiration  Denies known TB contacts of history of TB. Lives with his fiance and her mother. No sick contacts. No animal contacts or travel.   On admission he was found to be febrile to 101.7 as well as tachycardic.  White blood count was 11.2.  Imaging revealed probable necrotizing cavitary pneumonia in the left upper lobe.  There was also evidence of chronic pancreatitis and hepatic steatosis.  The cavity was noted to have thick walls of the left upper lobe with surrounding groundglass attenuation, septal thickening and regional architectural distortion.  Flu and COVID testing were negative.  On review of records he had a ER visit November 23, 2019 with seizure-like activity and possible alcohol withdrawal seizures.  Most recent chest x-ray in the system is from September 2020 and was normal.  Pain in April 2021 was positive for benzodiazepines.  In the past he has had alcohol levels as high as 279.  HIV antibody test was negative in February 2021  Past Medical History:  Diagnosis Date  . Alcohol abuse   . Neuropathy   . Pancreatitis   . Peptic ulcer disease    Past Surgical History:  Procedure Laterality Date  . APPENDECTOMY    . INCISION AND DRAINAGE Left 11/11/2018   Procedure: INCISION AND DRAINAGE;  Surgeon: Dereck Leep, MD;  Location: [facility] ORS;  Service:  Orthopedics;  Laterality: Left;  . LAPAROSCOPIC APPENDECTOMY N/A 07/06/2018   Procedure: APPENDECTOMY LAPAROSCOPIC;  Surgeon: Herbert Pun, MD;  Location: [facility] ORS;  Service: General;  Laterality: N/A;   Social History   Tobacco Use  . Smoking status: Current Every Day Smoker    Packs/day: 0.50    Types: Cigarettes  . Smokeless tobacco: Never Used  Vaping Use  . Vaping Use: Never used  Substance Use Topics  . Alcohol use: Yes    Comment: daily 40 oz, reports 4 40oz/day  . Drug use: No   No family history on file.  Allergies: No Known Allergies  Current antibiotics: Antibiotics Given (last 72 hours)    Date/Time Action Medication Dose Rate   02/20/21 1122 New Bag/Given   Ampicillin-Sulbactam (UNASYN) 3 g in sodium chloride 0.9 % 100 mL IVPB 3 g 200 mL/hr   02/20/21 1407 New Bag/Given   clindamycin (CLEOCIN) IVPB 600 mg 600 mg 100 mL/hr      MEDICATIONS: . enoxaparin (LOVENOX) injection  40 mg Subcutaneous Q24H  . folic acid  1 mg Oral Daily  . LORazepam  0-4 mg Intravenous Q6H   Followed by  . [START ON 02/22/2021] LORazepam  0-4 mg Intravenous Q12H  . multivitamin with minerals  1 tablet Oral Daily  . nicotine  21 mg Transdermal Daily  . thiamine  100 mg Oral Daily   Or  . thiamine  100 mg Intravenous Daily  Review of Systems - 11 systems reviewed and negative per HPI   OBJECTIVE: Temp:  [98.9 F (37.2 C)-101.7 F (38.7 C)] 98.9 F (37.2 C) (05/18 1127) Pulse Rate:  [94-124] 102 (05/18 1200) Resp:  [18] 18 (05/18 1030) BP: (114-122)/(78-91) 116/82 (05/18 1200) SpO2:  [99 %] 99 % (05/18 1030) Weight:  [63.5 kg] 63.5 kg (05/18 0754) Physical Exam  Constitutional: He is oriented to person, place, and time. He appears well-developed and well-nourished. No distress.  HENT:  Mouth/Throat: Oropharynx is clear and moist. No oropharyngeal exudate.  Cardiovascular: Normal rate, regular rhythm and normal heart sounds. Exam reveals no gallop and no friction  rub.  No murmur heard.  Pulmonary/Chest: Effort normal and breath sounds normal. No respiratory distress. He has no wheezes.  Abdominal: Soft. Bowel sounds are normal. He exhibits no distension. There is no tenderness.  Lymphadenopathy:  He has no cervical adenopathy.  Neurological: He is alert and oriented to person, place, and time.  Skin: Skin is warm and dry. No rash noted. No erythema.  Psychiatric: He has a normal mood and affect. His behavior is normal.     LABS: Results for orders placed or performed during the hospital encounter of 02/20/21 (from the past 48 hour(s))  Basic metabolic panel     Status: Abnormal   Collection Time: 02/20/21  7:53 AM  Result Value Ref Range   Sodium 134 (L) 135 - 145 mmol/L   Potassium 3.8 3.5 - 5.1 mmol/L   Chloride 98 98 - 111 mmol/L   CO2 24 22 - 32 mmol/L   Glucose, Bld 104 (H) 70 - 99 mg/dL    Comment: Glucose reference range applies only to samples taken after fasting for at least 8 hours.   BUN <5 (L) 6 - 20 mg/dL   Creatinine, Ser 0.78 0.61 - 1.24 mg/dL   Calcium 8.5 (L) 8.9 - 10.3 mg/dL   GFR, Estimated >60 >60 mL/min    Comment: (NOTE) Calculated using the CKD-EPI Creatinine Equation (2021)    Anion gap 12 5 - 15    Comment: Performed at [facility], [address] [place], [place] [address]  CBC     Status: Abnormal   Collection Time: 02/20/21  7:53 AM  Result Value Ref Range   WBC 11.2 (H) 4.0 - 10.5 K/uL   RBC 3.20 (L) 4.22 - 5.81 MIL/uL   Hemoglobin 10.0 (L) 13.0 - 17.0 g/dL   HCT 28.2 (L) 39.0 - 52.0 %   MCV 88.1 80.0 - 100.0 fL   MCH 31.3 26.0 - 34.0 pg   MCHC 35.5 30.0 - 36.0 g/dL   RDW 12.9 11.5 - 15.5 %   Platelets 288 150 - 400 K/uL   nRBC 0.0 0.0 - 0.2 %    Comment: Performed at [facility], [place], [place] [address]  Troponin I (High Sensitivity)     Status: None   Collection Time: 02/20/21  7:53 AM  Result Value Ref Range   Troponin I (High Sensitivity) 12 <18 ng/L     Comment: (NOTE) Elevated high sensitivity troponin I (hsTnI) values and significant  changes across serial measurements may suggest ACS but many other  chronic and acute conditions are known to elevate hsTnI results.  Refer to the "Links" section for chest pain algorithms and additional  guidance. Performed at [facility], [address] [place], [place] [address]   Procalcitonin     Status: None   Collection Time:  02/20/21  7:53 AM  Result Value Ref Range   Procalcitonin <0.10 ng/mL    Comment:        Interpretation: PCT (Procalcitonin) <= 0.5 ng/mL: Systemic infection (sepsis) is not likely. Local bacterial infection is possible. (NOTE)       Sepsis PCT Algorithm           Lower Respiratory Tract                                      Infection PCT Algorithm    ----------------------------     ----------------------------         PCT < 0.25 ng/mL                PCT < 0.10 ng/mL          Strongly encourage             Strongly discourage   discontinuation of antibiotics    initiation of antibiotics    ----------------------------     -----------------------------       PCT 0.25 - 0.50 ng/mL            PCT 0.10 - 0.25 ng/mL               OR       >80% decrease in PCT            Discourage initiation of                                            antibiotics      Encourage discontinuation           of antibiotics    ----------------------------     -----------------------------         PCT >= 0.50 ng/mL              PCT 0.26 - 0.50 ng/mL               AND        <80% decrease in PCT             Encourage initiation of                                             antibiotics       Encourage continuation           of antibiotics    ----------------------------     -----------------------------        PCT >= 0.50 ng/mL                  PCT > 0.50 ng/mL               AND         increase in PCT                  Strongly encourage                                       initiation of antibiotics    Strongly encourage escalation  of antibiotics                                     -----------------------------                                           PCT <= 0.25 ng/mL                                                 OR                                        > 80% decrease in PCT                                      Discontinue / Do not initiate                                             antibiotics  Performed at [facility], [address] [place], [place] [address]   Magnesium     Status: Abnormal   Collection Time: 02/20/21  7:53 AM  Result Value Ref Range   Magnesium 1.3 (L) 1.7 - 2.4 mg/dL    Comment: Performed at [facility], [address] [place], [place] [address]  Lactic acid, plasma     Status: None   Collection Time: 02/20/21  8:56 AM  Result Value Ref Range   Lactic Acid, Venous 1.5 0.5 - 1.9 mmol/L    Comment: Performed at [facility], [address] [place], [place] [address]  Resp Panel by RT-PCR (Flu A&B, Covid) Nasopharyngeal Swab     Status: None   Collection Time: 02/20/21  8:56 AM   Specimen: Nasopharyngeal Swab; Nasopharyngeal(NP) swabs in vial transport medium  Result Value Ref Range   SARS Coronavirus 2 by RT PCR NEGATIVE NEGATIVE    Comment: (NOTE) SARS-CoV-2 target nucleic acids are NOT DETECTED.  The SARS-CoV-2 RNA is generally detectable in upper respiratory specimens during the acute phase of infection. The lowest concentration of SARS-CoV-2 viral copies this assay can detect is 138 copies/mL. A negative result does not preclude SARS-Cov-2 infection and should not be used as the sole basis for treatment or other patient management decisions. A negative result may occur with  improper specimen collection/handling, submission of specimen other than nasopharyngeal swab, presence of viral mutation(s) within the areas targeted by this assay, and inadequate number of  viral copies(<138 copies/mL). A negative result must be combined with clinical observations, patient history, and epidemiological information. The expected result is Negative.  Fact Sheet for Patients:  EntrepreneurPulse.com.au  Fact Sheet for Healthcare Providers:  IncredibleEmployment.be  This test is no t yet approved or cleared by the [place] FDA and  has been authorized for detection and/or diagnosis of SARS-CoV-2 by FDA under an Emergency Use Authorization (EUA). This EUA will remain  in effect (meaning this test can be used) for the duration of the COVID-19 declaration under Section 564(b)(1) of the Act, 21 U.S.C.section 360bbb-3(b)(1), unless the authorization is terminated  or revoked sooner.       Influenza A by PCR NEGATIVE NEGATIVE   Influenza B by PCR NEGATIVE NEGATIVE    Comment: (NOTE) The Xpert Xpress SARS-CoV-2/FLU/RSV plus assay is intended as an aid in the diagnosis of influenza from Nasopharyngeal swab specimens and should not be used as a sole basis for treatment. Nasal washings and aspirates are unacceptable for Xpert Xpress SARS-CoV-2/FLU/RSV testing.  Fact Sheet for Patients: EntrepreneurPulse.com.au  Fact Sheet for Healthcare Providers: IncredibleEmployment.be  This test is not yet approved or cleared by the [place] FDA and has been authorized for detection and/or diagnosis of SARS-CoV-2 by FDA under an Emergency Use Authorization (EUA). This EUA will remain in effect (meaning this test can be used) for the duration of the COVID-19 declaration under Section 564(b)(1) of the Act, 21 U.S.C. section 360bbb-3(b)(1), unless the authorization is terminated or revoked.  Performed at [facility], [address] [place], [place] [address]    No components found for: ESR, C REACTIVE PROTEIN MICRO: Recent Results (from the past 720 hour(s))  Resp Panel by  RT-PCR (Flu A&B, Covid) Nasopharyngeal Swab     Status: None   Collection Time: 02/20/21  8:56 AM   Specimen: Nasopharyngeal Swab; Nasopharyngeal(NP) swabs in vial transport medium  Result Value Ref Range Status   SARS Coronavirus 2 by RT PCR NEGATIVE NEGATIVE Final    Comment: (NOTE) SARS-CoV-2 target nucleic acids are NOT DETECTED.  The SARS-CoV-2 RNA is generally detectable in upper respiratory specimens during the acute phase of infection. The lowest concentration of SARS-CoV-2 viral copies this assay can detect is 138 copies/mL. A negative result does not preclude SARS-Cov-2 infection and should not be used as the sole basis for treatment or other patient management decisions. A negative result may occur with  improper specimen collection/handling, submission of specimen other than nasopharyngeal swab, presence of viral mutation(s) within the areas targeted by this assay, and inadequate number of viral copies(<138 copies/mL). A negative result must be combined with clinical observations, patient history, and epidemiological information. The expected result is Negative.  Fact Sheet for Patients:  EntrepreneurPulse.com.au  Fact Sheet for Healthcare Providers:  IncredibleEmployment.be  This test is no t yet approved or cleared by the [place] FDA and  has been authorized for detection and/or diagnosis of SARS-CoV-2 by FDA under an Emergency Use Authorization (EUA). This EUA will remain  in effect (meaning this test can be used) for the duration of the COVID-19 declaration under Section 564(b)(1) of the Act, 21 U.S.C.section 360bbb-3(b)(1), unless the authorization is terminated  or revoked sooner.       Influenza A by PCR NEGATIVE NEGATIVE Final   Influenza B by PCR NEGATIVE NEGATIVE Final    Comment: (NOTE) The Xpert Xpress SARS-CoV-2/FLU/RSV plus assay is intended as an aid in the diagnosis of influenza from Nasopharyngeal swab  specimens and should not be used as a sole basis for treatment. Nasal washings and aspirates are unacceptable for Xpert Xpress SARS-CoV-2/FLU/RSV testing.  Fact Sheet for Patients: EntrepreneurPulse.com.au  Fact Sheet for Healthcare Providers: IncredibleEmployment.be  This test is not yet approved or cleared by the [place] FDA and has been authorized for detection and/or diagnosis of SARS-CoV-2 by FDA under an Emergency Use Authorization (EUA). This EUA will remain in effect (meaning this test can be  used) for the duration of the COVID-19 declaration under Section 564(b)(1) of the Act, 21 U.S.C. section 360bbb-3(b)(1), unless the authorization is terminated or revoked.  Performed at [facility], [address] [place], [place] [address]     IMAGING: [place] Chest 2 View  Result Date: 02/20/2021 CLINICAL DATA:  Left-sided chest pain and cough beginning 2 weeks ago EXAM: CHEST - 2 VIEW COMPARISON:  06/09/2019 FINDINGS: Focal opacity noted in the left upper lobe with interspersed regions of lucency. Lungs otherwise clear. Cardiomediastinal silhouette and pulmonary vasculature are within normal limits. IMPRESSION: Airspace opacity in the left upper lobe suspicious for cavitary pneumonia. Consider further evaluation with contrast enhanced chest CT. Radiographic follow-up to resolution should be performed to exclude malignancy. Electronically Signed   By: Miachel Roux M.D.   On: 02/20/2021 08:27   CT Chest W Contrast  Result Date: 02/20/2021 CLINICAL DATA:  45 year old male with history of pneumonia. Abnormal chest x-ray. EXAM: CT CHEST WITH CONTRAST TECHNIQUE: Multidetector CT imaging of the chest was performed during intravenous contrast administration. CONTRAST:  23m OMNIPAQUE IOHEXOL 300 MG/ML  SOLN COMPARISON:  No prior chest CT.  Chest x-ray 02/20/2021. FINDINGS: Cardiovascular: Heart size is normal. There is no significant pericardial  fluid, thickening or pericardial calcification. No atherosclerotic calcifications in the thoracic aorta or the coronary arteries. Mediastinum/Nodes: No pathologically enlarged mediastinal or hilar lymph nodes. Esophagus is unremarkable in appearance. No axillary lymphadenopathy. Lungs/Pleura: Large area of cavitation with thick walls in the posterior aspect of the left upper lobe near the apex with surrounding ground-glass attenuation, septal thickening and regional architectural distortion. Right lung is clear. No pleural effusions. Upper Abdomen: Aortic atherosclerosis. Diffuse low attenuation throughout the visualized hepatic parenchyma, indicative of hepatic steatosis. Morphologic changes in the pancreas indicative of chronic pancreatitis, including extensive coarse parenchymal calcifications, generalized parenchymal atrophy and diffuse ductal dilatation. Musculoskeletal: There are no aggressive appearing lytic or blastic lesions noted in the visualized portions of the skeleton. IMPRESSION: 1. Probable necrotizing cavitary pneumonia in the left upper lobe, as above. Strictly speaking, cavitary neoplasm is not entirely excluded, but not strongly favored at this time. Follow-up noncontrast chest CT is recommended in 2-3 months to re-evaluate this region after trial of antimicrobial therapy. 2. Morphologic changes in the pancreas indicative of chronic pancreatitis. 3. Hepatic steatosis. Electronically Signed   By: DVinnie LangtonM.D.   On: 02/20/2021 10:13    Assessment:   Lance HUNLEYis a 45y.o. male with hx of seizure disorder, ETOH abuse, peripheral neuropathy admitted with 2 weeks chest pain and cough and found to have fever and a cavitary LUL necrotizing Pneumonia. DIff is most likely aspiration given history of seizure disorder and ETOH use, however TB remains in differential and should be ruled out  Recommendations Isolate and send 3 serial AFB and one TB PCR Send routine culture Cont vanco  and unasyn. NO need for clindamycin at this time Check MRSA PCR And if neg can dc vancomycin, Follow clinically.   Thank you very much for allowing me to participate in the care of this patient. Please call with questions.   DCheral Marker FOla Spurr MD

## 2021-02-20 NOTE — ED Triage Notes (Addendum)
Pt comes via EMS from home with c/o left sided CP and cough that started about 2 weeks ago. EMS reports temp of 100.8. Pt given 324 aspirin.  117-123 HR  Pt unsure if COVID or not. Hx of pancreatitis.  Pt has productive cough and congestion.

## 2021-02-20 NOTE — ED Provider Notes (Signed)
[facility] Emergency Department Provider Note ____________________________________________   Event Date/Time   First MD Initiated Contact with Patient 02/20/21 [phone]     (approximate)  I have reviewed the triage vital signs and the nursing notes.   HISTORY  Chief Complaint Chest Pain    HPI Lance Green is a 45 y.o. male with PMH as noted below including seizure disorder, neuropathy, and alcohol abuse who presents with left-sided chest pain over the last 2 weeks, gradual onset, persistent course, and described as pressure-like.  He does not have any significant shortness of breath but does report a cough.  He did not know that he had a fever until he came to the hospital today.  In addition he reports chronic peripheral neuropathy and resulting bilateral leg numbness, although he states that his legs "gave out" today which is new for him.  Past Medical History:  Diagnosis Date  . Alcohol abuse   . Neuropathy   . Pancreatitis   . Peptic ulcer disease     Patient Active Problem List   Diagnosis Date Noted  . Seizure (HCC) 11/21/2019  . Alcohol abuse 11/21/2019  . Hypomagnesemia 11/21/2019  . Abscess of left middle finger 11/10/2018  . Acute appendicitis with localized peritonitis 07/06/2018    Past Surgical History:  Procedure Laterality Date  . APPENDECTOMY    . INCISION AND DRAINAGE Left 11/11/2018   Procedure: INCISION AND DRAINAGE;  Surgeon: Donato Heinz, MD;  Location: [facility] ORS;  Service: Orthopedics;  Laterality: Left;  . LAPAROSCOPIC APPENDECTOMY N/A 07/06/2018   Procedure: APPENDECTOMY LAPAROSCOPIC;  Surgeon: Carolan Shiver, MD;  Location: [facility] ORS;  Service: General;  Laterality: N/A;    Prior to Admission medications   Medication Sig Start Date End Date Taking? Authorizing Provider  amLODipine (NORVASC) 2.5 MG tablet Take 1 tablet (2.5 mg total) by mouth daily. 11/29/19   Lynn Ito, MD  chlordiazePOXIDE (LIBRIUM) 10 MG  capsule Day 1-2: Take 1 tablet p.o. every 8 hours. Day 3-4: Take 1 tablet p.o. every 12 hours. Day 5-6: Take 1 tablet p.o. daily. Day 7: Stop 07/25/20   Minna Antis, MD  folic acid (FOLVITE) 1 MG tablet Take 1 tablet (1 mg total) by mouth daily. 11/29/19   Lynn Ito, MD  levETIRAcetam (KEPPRA) 250 MG tablet Take 1 tablet (250 mg total) by mouth 2 (two) times daily. 11/28/19   Lynn Ito, MD  levETIRAcetam (KEPPRA) 250 MG tablet Take 1 tablet (250 mg total) by mouth 2 (two) times daily. 09/04/20 11/03/20  Merwyn Katos, MD  Multiple Vitamin (MULTIVITAMIN WITH MINERALS) TABS tablet Take 1 tablet by mouth daily. 11/29/19   Lynn Ito, MD  thiamine 100 MG tablet Take 1 tablet (100 mg total) by mouth daily. 11/28/19   Lynn Ito, MD    Allergies Patient has no known allergies.  No family history on file.  Social History Social History   Tobacco Use  . Smoking status: Current Every Day Smoker    Packs/day: 0.50    Types: Cigarettes  . Smokeless tobacco: Never Used  Vaping Use  . Vaping Use: Never used  Substance Use Topics  . Alcohol use: Yes    Comment: daily 40 oz, reports 4 40oz/day  . Drug use: No    Review of Systems  Constitutional: Positive for fever. Eyes: No redness. ENT: No sore throat or congestion. Cardiovascular: Positive chest pain. Respiratory: Denies shortness of breath. Gastrointestinal: No vomiting or diarrhea.  Genitourinary: Negative for dysuria.  Musculoskeletal: Negative for back pain. Skin: Negative for rash. Neurological: Negative for headache.  Positive for bilateral hand and foot numbness.   ____________________________________________   PHYSICAL EXAM:  VITAL SIGNS: ED Triage Vitals  Enc Vitals Group     BP 02/20/21 0752 (!) 122/91     Pulse Rate 02/20/21 0752 (!) 124     Resp --      Temp 02/20/21 0752 (!) 101.7 F (38.7 C)     Temp Source 02/20/21 0752 Oral     SpO2 02/20/21 0752 99 %     Weight 02/20/21 0754 140 lb (63.5  kg)     Height 02/20/21 0754 5\' 9"  (1.753 m)     Head Circumference --      Peak Flow --      Pain Score 02/20/21 0750 6     Pain Loc --      Pain Edu? --      Excl. in GC? --     Constitutional: Alert and oriented.  Relatively well appearing and in no acute distress. Eyes: Conjunctivae are normal.  Head: Atraumatic. Nose: No congestion/rhinnorhea. Mouth/Throat: Mucous membranes are moist.   Neck: Normal range of motion.  Cardiovascular: Tachycardic, regular rhythm. Grossly normal heart sounds.  Good peripheral circulation. Respiratory: Normal respiratory effort.  No retractions.  Slight rales to left upper lung, otherwise clear. Gastrointestinal: No distention.  Musculoskeletal: No lower extremity edema.  Extremities warm and well perfused.  Neurologic:  Normal speech and language.  Subjective decreased sensation to distal upper and lower extremities bilaterally.  Normal motor strength in all extremities. Skin:  Skin is warm and dry. No rash noted. Psychiatric: Mood and affect are normal. Speech and behavior are normal.  ____________________________________________   LABS (all labs ordered are listed, but only abnormal results are displayed)  Labs Reviewed  BASIC METABOLIC PANEL - Abnormal; Notable for the following components:      Result Value   Sodium 134 (*)    Glucose, Bld 104 (*)    BUN <5 (*)    Calcium 8.5 (*)    All other components within normal limits  CBC - Abnormal; Notable for the following components:   WBC 11.2 (*)    RBC 3.20 (*)    Hemoglobin 10.0 (*)    HCT 28.2 (*)    All other components within normal limits  CULTURE, BLOOD (ROUTINE X 2)  CULTURE, BLOOD (ROUTINE X 2)  RESP PANEL BY RT-PCR (FLU A&B, COVID) ARPGX2  LACTIC ACID, PLASMA  LACTIC ACID, PLASMA  TROPONIN I (HIGH SENSITIVITY)   ____________________________________________  EKG  ED ECG REPORT I, 02/22/21, the attending physician, personally viewed and interpreted this  ECG.  Date: 02/20/2021 EKG Time: 0756 Rate: 120 Rhythm: Sinus tachycardia QRS Axis: normal Intervals: normal ST/T Wave abnormalities: normal Narrative Interpretation: no evidence of acute ischemia  ____________________________________________  RADIOLOGY  Chest x-ray interpreted by me shows left upper lobe consolidation CT chest: Left upper lobe cavitary pneumonia versus possible malignancy  ____________________________________________   PROCEDURES  Procedure(s) performed: No  Procedures  Critical Care performed: No ____________________________________________   INITIAL IMPRESSION / ASSESSMENT AND PLAN / ED COURSE  Pertinent labs & imaging results that were available during my care of the patient were reviewed by me and considered in my medical decision making (see chart for details).  45 year old male with PMH as noted above including a seizure disorder, peripheral neuropathy, and alcohol abuse presents for chest pain as well as for his legs "giving  out" on him today with worsening neuropathy symptoms.  He did not realize he had a fever.  On exam, the patient is overall relatively well-appearing although he is febrile and tachycardic.  His other vital signs are normal.  O2 saturation is 100% on room air.  Exam is significant for mild rales in the left upper lung.  The patient has decreased sensation to his distal upper and lower extremities bilaterally, but no motor deficits.  Exam is otherwise unremarkable.  EKG is normal.  There is no evidence of ACS.  Chest x-ray shows a left upper lobe infiltrate, which is possibly cavitary.  Differential includes bacterial pneumonia, lung abscess, COVID-19, influenza or other viral etiology, or less likely cardiac cause.  We will obtain CT chest, cardiac enzymes, sepsis labs, and reassess.  ----------------------------------------- 11:50 AM on 02/20/2021 -----------------------------------------  CT chest confirms cavitary  pneumonia, although malignancy cannot be ruled out.  However with the fever and elevated WBC count pneumonia is more likely.  I have ordered antibiotics.  I consulted Dr. Clyde Lundborg from the hospitalist service for admission.  __________________________  [name] was evaluated in Emergency Department on 02/20/2021 for the symptoms described in the history of present illness. He was evaluated in the context of the global COVID-19 pandemic, which necessitated consideration that the patient might be at risk for infection with the SARS-CoV-2 virus that causes COVID-19. Institutional protocols and algorithms that pertain to the evaluation of patients at risk for COVID-19 are in a state of rapid change based on information released by regulatory bodies including the CDC and federal and state organizations. These policies and algorithms were followed during the patient's care in the ED.  ____________________________________________   FINAL CLINICAL IMPRESSION(S) / ED DIAGNOSES  Final diagnoses:  None      NEW MEDICATIONS STARTED DURING THIS VISIT:  New Prescriptions   No medications on file     Note:  This document was prepared using Dragon voice recognition software and may include unintentional dictation errors.   Dionne Bucy, MD 02/20/21 1513

## 2021-02-20 NOTE — H&P (Addendum)
History and Physical    [name] DGU:[id number] DOB: 01-17-1976 DOA: 02/20/2021  Referring MD/NP/PA:   PCP: Patient, No Pcp Per (Inactive)   Patient coming from:  The patient is coming from home.  At baseline, pt is independent for most of ADL.        Chief Complaint: chest pain, cough, fever  HPI: Lance Green is a 45 y.o. male with medical history significant of tobacco and alcohol abuse, PUD, pancreatitis, seizure, peripheral neuropathy, anemia, who presents with chest pain, cough and fever.  Patient states that he has been having cough and chest pain for almost 2 weeks.  The chest pain is located in the left side of the chest, moderate, pressure-like, nonradiating.  Patient has cough with little mucus production.  Patient denies subjective fever and chills, but his body temperature is 101.7 in emergency room.  Patient does not have nausea vomiting, diarrhea or abdominal pain.  Patient states that he has chronic leg numbness due to neuropathy in both legs.  ED Course: pt was found to have negative COVID PCR, WBC 11.2, electrolytes renal function okay, lactic acid 1.5, temperature one 1.7, tachycardia with heart rate 124, RR 18, oxygen saturation 99% on room air, temperature 115/83.  Chest x-ray showed left upper lobe cavitary pneumonia.  Patient is admitted to MedSurg bed as inpatient.  Dr. Sampson Goon of ID is consulted.  Review of Systems:   General: has fevers, no chills, no body weight gain, has fatigue HEENT: no blurry vision, hearing changes or sore throat Respiratory: has dyspnea, coughing, no wheezing CV: has chest pain, no palpitations GI: no nausea, vomiting, abdominal pain, diarrhea, constipation GU: no dysuria, burning on urination, increased urinary frequency, hematuria  Ext: no leg edema Neuro: no unilateral weakness, numbness, or tingling, no vision change or hearing loss Skin: no rash, no skin tear. MSK: No muscle spasm, no deformity, no limitation of range  of movement in spin Heme: No easy bruising.  Travel history: No recent long distant travel.  Allergy: No Known Allergies  Past Medical History:  Diagnosis Date  . Alcohol abuse   . HTN (hypertension)   . Neuropathy   . Pancreatitis   . Peptic ulcer disease     Past Surgical History:  Procedure Laterality Date  . APPENDECTOMY    . INCISION AND DRAINAGE Left 11/11/2018   Procedure: INCISION AND DRAINAGE;  Surgeon: Donato Heinz, MD;  Location: [facility] ORS;  Service: Orthopedics;  Laterality: Left;  . LAPAROSCOPIC APPENDECTOMY N/A 07/06/2018   Procedure: APPENDECTOMY LAPAROSCOPIC;  Surgeon: Carolan Shiver, MD;  Location: [facility] ORS;  Service: General;  Laterality: N/A;    Social History:  reports that he has been smoking cigarettes. He has been smoking about 0.50 packs per day. He has never used smokeless tobacco. He reports current alcohol use. He reports that he does not use drugs.  Family History:  Family History  Problem Relation Age of Onset  . Cataracts Mother      Prior to Admission medications   Medication Sig Start Date End Date Taking? Authorizing Provider  amLODipine (NORVASC) 2.5 MG tablet Take 1 tablet (2.5 mg total) by mouth daily. 11/29/19   Lynn Ito, MD  chlordiazePOXIDE (LIBRIUM) 10 MG capsule Day 1-2: Take 1 tablet p.o. every 8 hours. Day 3-4: Take 1 tablet p.o. every 12 hours. Day 5-6: Take 1 tablet p.o. daily. Day 7: Stop 07/25/20   Minna Antis, MD  folic acid (FOLVITE) 1 MG tablet Take 1 tablet (  1 mg total) by mouth daily. 11/29/19   Lynn Ito, MD  levETIRAcetam (KEPPRA) 250 MG tablet Take 1 tablet (250 mg total) by mouth 2 (two) times daily. 11/28/19   Lynn Ito, MD  levETIRAcetam (KEPPRA) 250 MG tablet Take 1 tablet (250 mg total) by mouth 2 (two) times daily. 09/04/20 11/03/20  Merwyn Katos, MD  Multiple Vitamin (MULTIVITAMIN WITH MINERALS) TABS tablet Take 1 tablet by mouth daily. 11/29/19   Lynn Ito, MD  thiamine 100 MG tablet Take  1 tablet (100 mg total) by mouth daily. 11/28/19   Lynn Ito, MD    Physical Exam: Vitals:   02/20/21 1200 02/20/21 1430 02/20/21 1530 02/20/21 1716  BP: 116/82 130/89 (!) 126/91 (!) 130/106  Pulse: (!) 102 (!) 112 92 80  Resp:   16 17  Temp:      TempSrc:      SpO2:  100% 100% 100%  Weight:      Height:       General: Not in acute distress HEENT:       Eyes: PERRL, EOMI, no scleral icterus.       ENT: No discharge from the ears and nose, no pharynx injection, no tonsillar enlargement.        Neck: No JVD, no bruit, no mass felt. Heme: No neck lymph node enlargement. Cardiac: S1/S2, RRR, No murmurs, No gallops or rubs. Respiratory: No rales, wheezing, rhonchi or rubs. GI: Soft, nondistended, nontender, no rebound pain, no organomegaly, BS present. GU: No hematuria Ext: No pitting leg edema bilaterally. 1+DP/PT pulse bilaterally. Musculoskeletal: No joint deformities, No joint redness or warmth, no limitation of ROM in spin. Skin: No rashes.  Neuro: Alert, oriented X3, cranial nerves II-XII grossly intact, moves all extremities normally. Psych: Patient is not psychotic, no suicidal or hemocidal ideation.  Labs on Admission: I have personally reviewed following labs and imaging studies  CBC: Recent Labs  Lab 02/20/21 0753  WBC 11.2*  HGB 10.0*  HCT 28.2*  MCV 88.1  PLT 288   Basic Metabolic Panel: Recent Labs  Lab 02/20/21 0753  NA 134*  K 3.8  CL 98  CO2 24  GLUCOSE 104*  BUN <5*  CREATININE 0.78  CALCIUM 8.5*  MG 1.3*   GFR: Estimated Creatinine Clearance: 103.6 mL/min (by C-G formula based on SCr of 0.78 mg/dL). Liver Function Tests: No results for input(s): AST, ALT, ALKPHOS, BILITOT, PROT, ALBUMIN in the last 168 hours. No results for input(s): LIPASE, AMYLASE in the last 168 hours. No results for input(s): AMMONIA in the last 168 hours. Coagulation Profile: No results for input(s): INR, PROTIME in the last 168 hours. Cardiac Enzymes: No results  for input(s): CKTOTAL, CKMB, CKMBINDEX, TROPONINI in the last 168 hours. BNP (last 3 results) No results for input(s): PROBNP in the last 8760 hours. HbA1C: No results for input(s): HGBA1C in the last 72 hours. CBG: No results for input(s): GLUCAP in the last 168 hours. Lipid Profile: No results for input(s): CHOL, HDL, LDLCALC, TRIG, CHOLHDL, LDLDIRECT in the last 72 hours. Thyroid Function Tests: No results for input(s): TSH, T4TOTAL, FREET4, T3FREE, THYROIDAB in the last 72 hours. Anemia Panel: No results for input(s): VITAMINB12, FOLATE, FERRITIN, TIBC, IRON, RETICCTPCT in the last 72 hours. Urine analysis:    Component Value Date/Time   COLORURINE COLORLESS (A) 07/24/2020 2319   APPEARANCEUR CLEAR (A) 07/24/2020 2319   APPEARANCEUR Hazy 01/18/2014 1701   LABSPEC 1.002 (L) 07/24/2020 2319   LABSPEC 1.013 01/18/2014 1701  PHURINE 5.0 07/24/2020 2319   GLUCOSEU NEGATIVE 07/24/2020 2319   GLUCOSEU Negative 01/18/2014 1701   HGBUR NEGATIVE 07/24/2020 2319   BILIRUBINUR NEGATIVE 07/24/2020 2319   BILIRUBINUR Negative 01/18/2014 1701   KETONESUR NEGATIVE 07/24/2020 2319   PROTEINUR NEGATIVE 07/24/2020 2319   NITRITE NEGATIVE 07/24/2020 2319   LEUKOCYTESUR NEGATIVE 07/24/2020 2319   LEUKOCYTESUR Negative 01/18/2014 1701   Sepsis Labs: @LABRCNTIP (procalcitonin:4,lacticidven:4) ) Recent Results (from the past 240 hour(s))  Resp Panel by RT-PCR (Flu A&B, Covid) Nasopharyngeal Swab     Status: None   Collection Time: 02/20/21  8:56 AM   Specimen: Nasopharyngeal Swab; Nasopharyngeal(NP) swabs in vial transport medium  Result Value Ref Range Status   SARS Coronavirus 2 by RT PCR NEGATIVE NEGATIVE Final    Comment: (NOTE) SARS-CoV-2 target nucleic acids are NOT DETECTED.  The SARS-CoV-2 RNA is generally detectable in upper respiratory specimens during the acute phase of infection. The lowest concentration of SARS-CoV-2 viral copies this assay can detect is 138 copies/mL. A  negative result does not preclude SARS-Cov-2 infection and should not be used as the sole basis for treatment or other patient management decisions. A negative result may occur with  improper specimen collection/handling, submission of specimen other than nasopharyngeal swab, presence of viral mutation(s) within the areas targeted by this assay, and inadequate number of viral copies(<138 copies/mL). A negative result must be combined with clinical observations, patient history, and epidemiological information. The expected result is Negative.  Fact Sheet for Patients:  02/22/21  Fact Sheet for Healthcare Providers:  BloggerCourse.com  This test is no t yet approved or cleared by the [employer] FDA and  has been authorized for detection and/or diagnosis of SARS-CoV-2 by FDA under an Emergency Use Authorization (EUA). This EUA will remain  in effect (meaning this test can be used) for the duration of the COVID-19 declaration under Section 564(b)(1) of the Act, 21 U.S.C.section 360bbb-3(b)(1), unless the authorization is terminated  or revoked sooner.       Influenza A by PCR NEGATIVE NEGATIVE Final   Influenza B by PCR NEGATIVE NEGATIVE Final    Comment: (NOTE) The Xpert Xpress SARS-CoV-2/FLU/RSV plus assay is intended as an aid in the diagnosis of influenza from Nasopharyngeal swab specimens and should not be used as a sole basis for treatment. Nasal washings and aspirates are unacceptable for Xpert Xpress SARS-CoV-2/FLU/RSV testing.  Fact Sheet for Patients: [place]  Fact Sheet for Healthcare Providers: BloggerCourse.com  This test is not yet approved or cleared by the [employer] FDA and has been authorized for detection and/or diagnosis of SARS-CoV-2 by FDA under an Emergency Use Authorization (EUA). This EUA will remain in effect (meaning this test can  be used) for the duration of the COVID-19 declaration under Section 564(b)(1) of the Act, 21 U.S.C. section 360bbb-3(b)(1), unless the authorization is terminated or revoked.  Performed at [facility], [address] [place], [place]      Radiological Exams on Admission: DG Chest 2 View  Result Date: 02/20/2021 CLINICAL DATA:  Left-sided chest pain and cough beginning 2 weeks ago EXAM: CHEST - 2 VIEW COMPARISON:  06/09/2019 FINDINGS: Focal opacity noted in the left upper lobe with interspersed regions of lucency. Lungs otherwise clear. Cardiomediastinal silhouette and pulmonary vasculature are within normal limits. IMPRESSION: Airspace opacity in the left upper lobe suspicious for cavitary pneumonia. Consider further evaluation with contrast enhanced chest CT. Radiographic follow-up to resolution should be performed to exclude malignancy. Electronically Signed   By:  Mauri Reading  Mir M.D.   On: 02/20/2021 08:27   CT Chest W Contrast  Result Date: 02/20/2021 CLINICAL DATA:  45 year old male with history of pneumonia. Abnormal chest x-ray. EXAM: CT CHEST WITH CONTRAST TECHNIQUE: Multidetector CT imaging of the chest was performed during intravenous contrast administration. CONTRAST:  75mL OMNIPAQUE IOHEXOL 300 MG/ML  SOLN COMPARISON:  No prior chest CT.  Chest x-ray 02/20/2021. FINDINGS: Cardiovascular: Heart size is normal. There is no significant pericardial fluid, thickening or pericardial calcification. No atherosclerotic calcifications in the thoracic aorta or the coronary arteries. Mediastinum/Nodes: No pathologically enlarged mediastinal or hilar lymph nodes. Esophagus is unremarkable in appearance. No axillary lymphadenopathy. Lungs/Pleura: Large area of cavitation with thick walls in the posterior aspect of the left upper lobe near the apex with surrounding ground-glass attenuation, septal thickening and regional architectural distortion. Right lung is clear. No pleural  effusions. Upper Abdomen: Aortic atherosclerosis. Diffuse low attenuation throughout the visualized hepatic parenchyma, indicative of hepatic steatosis. Morphologic changes in the pancreas indicative of chronic pancreatitis, including extensive coarse parenchymal calcifications, generalized parenchymal atrophy and diffuse ductal dilatation. Musculoskeletal: There are no aggressive appearing lytic or blastic lesions noted in the visualized portions of the skeleton. IMPRESSION: 1. Probable necrotizing cavitary pneumonia in the left upper lobe, as above. Strictly speaking, cavitary neoplasm is not entirely excluded, but not strongly favored at this time. Follow-up noncontrast chest CT is recommended in 2-3 months to re-evaluate this region after trial of antimicrobial therapy. 2. Morphologic changes in the pancreas indicative of chronic pancreatitis. 3. Hepatic steatosis. Electronically Signed   By: Trudie Reed M.D.   On: 02/20/2021 10:13     EKG: I have personally reviewed.  Sinus rhythm, QTC 438, low voltage.  Assessment/Plan Principal Problem:   Cavitary pneumonia Active Problems:   Seizure (HCC)   Alcohol abuse   Sepsis (HCC)   Normocytic anemia   HTN (hypertension)   Sepsis due cavitary pneumonia: Patient meets criteria for sepsis with tachycardia with heart rate of 124, fever of 101.7.  Lactic acid normal 1.5.  Mild leukocytosis WBC 11.2.  Currently hemodynamically stable.  Consulted infectious disease, Dr. Sampson Goon, he recommended to rule out TB.  - Will admit to med-surg bed as inpt - IV Vancomycin and Unasyn - airborne isolation and negative pressure room - Mucinex for cough  - Bronchodilators - Urine legionella and S. pneumococcal antigen - Follow up blood culture x2, sputum culture - will get Procalcitonin - IVF: 1L of NS bolus in ED -Acid-fast culture and smear, TB PCR per ID  Seizure -Seizure precaution -When necessary Ativan for seizure -Continue Home medications:  Keppra  Tobacco abuse and Alcohol abuse: -Did counseling about importance of quitting smoking and drinking alcohol -Nicotine patch -CIWA protocol  Normocytic anemia: Hgb stable, 10.0 -f/u by CBC  HTN (hypertension): Patient is not taking amlodipine currently.  Blood pressure 115/83 -IV hydralazine as needed      DVT ppx:  SQ Lovenox Code Status: Full code Family Communication: not done, no family member is at bed side.    Disposition Plan:  Anticipate discharge back to previous environment Consults called:  Dr. Sampson Goon of ID Admission status and Level of care: Med-Surg:   as inpt     Status is: Inpatient  Remains inpatient appropriate because:Inpatient level of care appropriate due to severity of illness   Dispo: The patient is from: Home              Anticipated d/c is to: Home  Patient currently is not medically stable to d/c.   Difficult to place patient No         Date of Service 02/20/2021    Lorretta HarpXilin Chavela Justiniano [facility]   If 7PM-7AM, please contact night-coverage [email or web address] 02/20/2021, 5:30 PM

## 2021-02-20 NOTE — Consult Note (Signed)
Pharmacy Antibiotic Note  Lance Green is a 45 y.o. male admitted on 02/20/2021 with cavitatory PNA.  Pharmacy has been consulted for Unasyn and vancomycin dosing.  Plan: Pt was ordered Unasyn 3g q6H   Will order vancomycin 1500 mg x 1 loading dose followed by vancomycin 1000 mg q12H. Predicted AUC of 436. Goal AUC 400-550. Scr used 0.8. plan for order vancomycin levels prior to the 4th or 5th dose. MRSA PCR ordered.   Height: 5\' 9"  (175.3 cm) Weight: 63.5 kg (140 lb) IBW/kg (Calculated) : 70.7  Temp (24hrs), Avg:100.3 F (37.9 C), Min:98.9 F (37.2 C), Max:101.7 F (38.7 C)  Recent Labs  Lab 02/20/21 0753 02/20/21 0856  WBC 11.2*  --   CREATININE 0.78  --   LATICACIDVEN  --  1.5    Estimated Creatinine Clearance: 103.6 mL/min (by C-G formula based on SCr of 0.78 mg/dL).    No Known Allergies  Antimicrobials this admission: 5/18 Unasyn >>  5/18 vancomycin >>   Dose adjustments this admission: none  Microbiology results: 5/18 BCx: pending  Thank you for allowing pharmacy to be a part of this patient's care.  6/18, PharmD, BCPS 02/20/2021 12:20 PM

## 2021-02-21 DIAGNOSIS — R569 Unspecified convulsions: Secondary | ICD-10-CM

## 2021-02-21 DIAGNOSIS — A419 Sepsis, unspecified organism: Principal | ICD-10-CM

## 2021-02-21 DIAGNOSIS — R6520 Severe sepsis without septic shock: Secondary | ICD-10-CM

## 2021-02-21 LAB — BASIC METABOLIC PANEL
Anion gap: 11 (ref 5–15)
BUN: 5 mg/dL — ABNORMAL LOW (ref 6–20)
CO2: 23 mmol/L (ref 22–32)
Calcium: 8.3 mg/dL — ABNORMAL LOW (ref 8.9–10.3)
Chloride: 101 mmol/L (ref 98–111)
Creatinine, Ser: 0.71 mg/dL (ref 0.61–1.24)
GFR, Estimated: >60 mL/min (ref >60)
Glucose, Bld: 67 mg/dL — ABNORMAL LOW (ref 70–99)
Potassium: 3.5 mmol/L (ref 3.5–5.1)
Sodium: 135 mmol/L (ref 135–145)

## 2021-02-21 LAB — IRON AND TIBC
Iron: 55 ug/dL (ref 45–182)
Saturation Ratios: 38 % (ref 17.9–39.5)
TIBC: 146 ug/dL — ABNORMAL LOW (ref 250–450)
UIBC: 91 ug/dL

## 2021-02-21 LAB — VITAMIN B12: Vitamin B-12: 1111 pg/mL — ABNORMAL HIGH (ref 180–914)

## 2021-02-21 LAB — CBC
HCT: 27.9 % — ABNORMAL LOW (ref 39.0–52.0)
Hemoglobin: 9.5 g/dL — ABNORMAL LOW (ref 13.0–17.0)
MCH: 30.5 pg (ref 26.0–34.0)
MCHC: 34.1 g/dL (ref 30.0–36.0)
MCV: 89.7 fL (ref 80.0–100.0)
Platelets: 296 10*3/uL (ref 150–400)
RBC: 3.11 MIL/uL — ABNORMAL LOW (ref 4.22–5.81)
RDW: 12.9 % (ref 11.5–15.5)
WBC: 10.8 10*3/uL — ABNORMAL HIGH (ref 4.0–10.5)
nRBC: 0 % (ref 0.0–0.2)

## 2021-02-21 LAB — EXPECTORATED SPUTUM ASSESSMENT W GRAM STAIN, RFLX TO RESP C

## 2021-02-21 LAB — HIV ANTIBODY (ROUTINE TESTING W REFLEX): HIV Screen 4th Generation wRfx: NONREACTIVE

## 2021-02-21 LAB — MRSA PCR SCREENING: MRSA by PCR: NEGATIVE

## 2021-02-21 LAB — STREP PNEUMONIAE URINARY ANTIGEN: Strep Pneumo Urinary Antigen: NEGATIVE

## 2021-02-21 MED ORDER — SODIUM CHLORIDE 0.9 % IV SOLN
10.0000 mL/kg | Freq: Once | INTRAVENOUS | Status: AC
  Administered 2021-02-21: 635 mL via INTRAVENOUS

## 2021-02-21 NOTE — Progress Notes (Signed)
PROGRESS NOTE    Lance Green  CBJ:[id number] DOB: 10-02-1976 DOA: 02/20/2021 PCP: Patient, No Pcp Per (Inactive)   Chief complaint.  Fever and cough Brief Narrative:  Lance Green is a 45 y.o. male with medical history significant of tobacco and alcohol abuse, PUD, pancreatitis, seizure, peripheral neuropathy, anemia, who presents with chest pain, cough and fever.  Chest CT scan showed a large left upper lobe cavitary lesion, consistent with necrotizing pneumonia. Patient has been seen by infectious disease, started on vancomycin and Unasyn.  Assessment & Plan:   Principal Problem:   Cavitary pneumonia Active Problems:   Seizure ([place])   Alcohol abuse   Sepsis ([place])   Normocytic anemia   HTN (hypertension)  1.  Severe sepsis secondary to pneumonia. Left upper lobe cavitary pneumonia. Hypotension. Patient met sepsis criteria time admission with tachycardia and a high fever.  Patient also has a mild leukocytosis.  Patient developed hypotension today, receiving 1 L IV fluid bolus.  Lactic acid level still normal. Blood cultures pending. Sputum culture is also pending, gram stain showed gram-positive cocci in pairs and gram-negative rods.  MRSA culture is negative. Appreciate ID consult. I will continue vancomycin and Unasyn for now until sputum culture is available.  I may discontinue vancomycin if is there is no MRSA in the sputum culture. Acid-fast smear x3 pending.  Hyponatremia Continue to follow.  3.  Seizure disorder. Continue home medicine with Keppra.  4.  Essential hypertension. Continue to follow.  #5.  Hypomagnesemia. Recheck level tomorrow.  #6.  Anemia. Check iron B12 level.  DVT prophylaxis: lovenox Code Status: full Family Communication:  Disposition Plan:  .   Status is: Inpatient  Remains inpatient appropriate because:Inpatient level of care appropriate due to severity of illness   Dispo: The patient is from: Home               Anticipated d/c is to: Home              Patient currently is not medically stable to d/c.   Difficult to place patient No        I/O last 3 completed shifts: In: 1700 [IV Piggyback:1700] Out: -  No intake/output data recorded.     Consultants:   ID  Procedures: None  Antimicrobials: Vancomycin and Unasyn.  Subjective: Patient developed hypotension today, received 1 L IV fluid bolus.  Blood pressure is better afterwards. Patient states that he feels well today.  Does not have any short of breath, he still have a cough, nonproductive.  He also has some chest pain with cough. He did not feel a fever, no chills. No abdominal pain or nausea vomiting. No dysuria hematuria.   Objective: Vitals:   02/21/21 0429 02/21/21 0907 02/21/21 1151 02/21/21 1310  BP: 93/67 129/89 (!) 86/57 91/64  Pulse: (!) 109 99 89 (!) 103  Resp: 20 18 18  (!) 66  Temp: 99.5 F (37.5 C) 100.2 F (37.9 C) 99.9 F (37.7 C)   TempSrc: Oral     SpO2: 98% 100% 100% 100%  Weight:      Height:        Intake/Output Summary (Last 24 hours) at 02/21/2021 1329 Last data filed at 02/21/2021 0134 Gross per 24 hour  Intake 600 ml  Output --  Net 600 ml   Filed Weights   02/20/21 0754  Weight: 63.5 kg    Examination:  General exam: Appears calm and comfortable  Respiratory system: Clear to auscultation. Respiratory effort  normal. Cardiovascular system: S1 & S2 heard, RRR. No JVD, murmurs, rubs, gallops or clicks. No pedal edema. Gastrointestinal system: Abdomen is nondistended, soft and nontender. No organomegaly or masses felt. Normal bowel sounds heard. Central nervous system: Alert and oriented x3. No focal neurological deficits. Extremities: Symmetric 5 x 5 power. Skin: No rashes, lesions or ulcers Psychiatry: Judgement and insight appear normal. Mood & affect appropriate.     Data Reviewed: I have personally reviewed following labs and imaging studies  CBC: Recent Labs  Lab  02/20/21 0753 02/21/21 0443  WBC 11.2* 10.8*  HGB 10.0* 9.5*  HCT 28.2* 27.9*  MCV 88.1 89.7  PLT 288 132   Basic Metabolic Panel: Recent Labs  Lab 02/20/21 0753 02/21/21 0443  NA 134* 135  K 3.8 3.5  CL 98 101  CO2 24 23  GLUCOSE 104* 67*  BUN <5* 5*  CREATININE 0.78 0.71  CALCIUM 8.5* 8.3*  MG 1.3*  --    GFR: Estimated Creatinine Clearance: 103.6 mL/min (by C-G formula based on SCr of 0.71 mg/dL). Liver Function Tests: No results for input(s): AST, ALT, ALKPHOS, BILITOT, PROT, ALBUMIN in the last 168 hours. No results for input(s): LIPASE, AMYLASE in the last 168 hours. No results for input(s): AMMONIA in the last 168 hours. Coagulation Profile: No results for input(s): INR, PROTIME in the last 168 hours. Cardiac Enzymes: No results for input(s): CKTOTAL, CKMB, CKMBINDEX, TROPONINI in the last 168 hours. BNP (last 3 results) No results for input(s): PROBNP in the last 8760 hours. HbA1C: No results for input(s): HGBA1C in the last 72 hours. CBG: No results for input(s): GLUCAP in the last 168 hours. Lipid Profile: No results for input(s): CHOL, HDL, LDLCALC, TRIG, CHOLHDL, LDLDIRECT in the last 72 hours. Thyroid Function Tests: No results for input(s): TSH, T4TOTAL, FREET4, T3FREE, THYROIDAB in the last 72 hours. Anemia Panel: No results for input(s): VITAMINB12, FOLATE, FERRITIN, TIBC, IRON, RETICCTPCT in the last 72 hours. Sepsis Labs: Recent Labs  Lab 02/20/21 0753 02/20/21 0856  PROCALCITON <0.10  --   LATICACIDVEN  --  1.5    Recent Results (from the past 240 hour(s))  Culture, blood (routine x 2)     Status: None (Preliminary result)   Collection Time: 02/20/21  8:56 AM   Specimen: BLOOD  Result Value Ref Range Status   Specimen Description BLOOD BLOOD LEFT WRIST  Final   Special Requests   Final    BOTTLES DRAWN AEROBIC AND ANAEROBIC Blood Culture adequate volume   Culture   Final    NO GROWTH < 24 HOURS Performed at [facility],  [address] [place], [place] [address]    Report Status PENDING  Incomplete  Culture, blood (routine x 2)     Status: None (Preliminary result)   Collection Time: 02/20/21  8:56 AM   Specimen: BLOOD  Result Value Ref Range Status   Specimen Description BLOOD BLOOD RIGHT FOREARM  Final   Special Requests   Final    BOTTLES DRAWN AEROBIC AND ANAEROBIC Blood Culture results may not be optimal due to an inadequate volume of blood received in culture bottles   Culture   Final    NO GROWTH < 24 HOURS Performed at [facility], [address] [place],  [address]    Report Status PENDING  Incomplete  Resp Panel by RT-PCR (Flu A&B, Covid) Nasopharyngeal Swab     Status: None   Collection Time: 02/20/21  8:56 AM   Specimen:  Nasopharyngeal Swab; Nasopharyngeal(NP) swabs in vial transport medium  Result Value Ref Range Status   SARS Coronavirus 2 by RT PCR NEGATIVE NEGATIVE Final    Comment: (NOTE) SARS-CoV-2 target nucleic acids are NOT DETECTED.  The SARS-CoV-2 RNA is generally detectable in upper respiratory specimens during the acute phase of infection. The lowest concentration of SARS-CoV-2 viral copies this assay can detect is 138 copies/mL. A negative result does not preclude SARS-Cov-2 infection and should not be used as the sole basis for treatment or other patient management decisions. A negative result may occur with  improper specimen collection/handling, submission of specimen other than nasopharyngeal swab, presence of viral mutation(s) within the areas targeted by this assay, and inadequate number of viral copies(<138 copies/mL). A negative result must be combined with clinical observations, patient history, and epidemiological information. The expected result is Negative.  Fact Sheet for Patients:  EntrepreneurPulse.com.au  Fact Sheet for Healthcare Providers:  IncredibleEmployment.be  This test is no t yet  approved or cleared by the [place] FDA and  has been authorized for detection and/or diagnosis of SARS-CoV-2 by FDA under an Emergency Use Authorization (EUA). This EUA will remain  in effect (meaning this test can be used) for the duration of the COVID-19 declaration under Section 564(b)(1) of the Act, 21 U.S.C.section 360bbb-3(b)(1), unless the authorization is terminated  or revoked sooner.       Influenza A by PCR NEGATIVE NEGATIVE Final   Influenza B by PCR NEGATIVE NEGATIVE Final    Comment: (NOTE) The Xpert Xpress SARS-CoV-2/FLU/RSV plus assay is intended as an aid in the diagnosis of influenza from Nasopharyngeal swab specimens and should not be used as a sole basis for treatment. Nasal washings and aspirates are unacceptable for Xpert Xpress SARS-CoV-2/FLU/RSV testing.  Fact Sheet for Patients: EntrepreneurPulse.com.au  Fact Sheet for Healthcare Providers: IncredibleEmployment.be  This test is not yet approved or cleared by the [place] FDA and has been authorized for detection and/or diagnosis of SARS-CoV-2 by FDA under an Emergency Use Authorization (EUA). This EUA will remain in effect (meaning this test can be used) for the duration of the COVID-19 declaration under Section 564(b)(1) of the Act, 21 U.S.C. section 360bbb-3(b)(1), unless the authorization is terminated or revoked.  Performed at [facility], [address] [place], [place] [address]   Culture, sputum-assessment     Status: None   Collection Time: 02/20/21 11:00 PM   Specimen: Sputum  Result Value Ref Range Status   Specimen Description SPUTUM  Final   Special Requests NONE  Final   Sputum evaluation   Final    THIS SPECIMEN IS ACCEPTABLE FOR SPUTUM CULTURE Performed at [facility], [address] [place], [place] [address]    Report Status 02/21/2021 FINAL  Final  MRSA PCR Screening     Status: None   Collection Time:  02/20/21 11:00 PM   Specimen: Nasal Mucosa; Nasopharyngeal  Result Value Ref Range Status   MRSA by PCR NEGATIVE NEGATIVE Final    Comment:        The GeneXpert MRSA Assay (FDA approved for NASAL specimens only), is one component of a comprehensive MRSA colonization surveillance program. It is not intended to diagnose MRSA infection nor to guide or monitor treatment for MRSA infections. Performed at [facility], [address] [place], [place] [address]   Culture, Respiratory w Gram Stain     Status: None (Preliminary result)   Collection Time: 02/20/21 11:00 PM   Specimen: SPU  Result Value Ref Range Status   Specimen Description   Final    SPUTUM Performed at [facility], [address] [place], [place] [address]    Special Requests   Final    NONE Reflexed from [phone] Performed at [facility], [place], [place] [address]    Gram Stain   Final    RARE WBC PRESENT, PREDOMINANTLY PMN FEW GRAM POSITIVE COCCI IN PAIRS IN CLUSTERS FEW GRAM NEGATIVE RODS Performed at [facility], [place] [address], [place], [place] [address]    Culture PENDING  Incomplete   Report Status PENDING  Incomplete         Radiology Studies: DG Chest 2 View  Result Date: 02/20/2021 CLINICAL DATA:  Left-sided chest pain and cough beginning 2 weeks ago EXAM: CHEST - 2 VIEW COMPARISON:  06/09/2019 FINDINGS: Focal opacity noted in the left upper lobe with interspersed regions of lucency. Lungs otherwise clear. Cardiomediastinal silhouette and pulmonary vasculature are within normal limits. IMPRESSION: Airspace opacity in the left upper lobe suspicious for cavitary pneumonia. Consider further evaluation with contrast enhanced chest CT. Radiographic follow-up to resolution should be performed to exclude malignancy. Electronically Signed   By: Miachel Roux M.D.   On: 02/20/2021 08:27   CT Chest W Contrast  Result Date: 02/20/2021 CLINICAL DATA:   45 year old male with history of pneumonia. Abnormal chest x-ray. EXAM: CT CHEST WITH CONTRAST TECHNIQUE: Multidetector CT imaging of the chest was performed during intravenous contrast administration. CONTRAST:  4m OMNIPAQUE IOHEXOL 300 MG/ML  SOLN COMPARISON:  No prior chest CT.  Chest x-ray 02/20/2021. FINDINGS: Cardiovascular: Heart size is normal. There is no significant pericardial fluid, thickening or pericardial calcification. No atherosclerotic calcifications in the thoracic aorta or the coronary arteries. Mediastinum/Nodes: No pathologically enlarged mediastinal or hilar lymph nodes. Esophagus is unremarkable in appearance. No axillary lymphadenopathy. Lungs/Pleura: Large area of cavitation with thick walls in the posterior aspect of the left upper lobe near the apex with surrounding ground-glass attenuation, septal thickening and regional architectural distortion. Right lung is clear. No pleural effusions. Upper Abdomen: Aortic atherosclerosis. Diffuse low attenuation throughout the visualized hepatic parenchyma, indicative of hepatic steatosis. Morphologic changes in the pancreas indicative of chronic pancreatitis, including extensive coarse parenchymal calcifications, generalized parenchymal atrophy and diffuse ductal dilatation. Musculoskeletal: There are no aggressive appearing lytic or blastic lesions noted in the visualized portions of the skeleton. IMPRESSION: 1. Probable necrotizing cavitary pneumonia in the left upper lobe, as above. Strictly speaking, cavitary neoplasm is not entirely excluded, but not strongly favored at this time. Follow-up noncontrast chest CT is recommended in 2-3 months to re-evaluate this region after trial of antimicrobial therapy. 2. Morphologic changes in the pancreas indicative of chronic pancreatitis. 3. Hepatic steatosis. Electronically Signed   By: DVinnie LangtonM.D.   On: 02/20/2021 10:13        Scheduled Meds: . enoxaparin (LOVENOX) injection  40 mg  Subcutaneous Q24H  . folic acid  1 mg Oral Daily  . levETIRAcetam  250 mg Oral BID  . LORazepam  0-4 mg Intravenous Q6H   Followed by  . [START ON 02/22/2021] LORazepam  0-4 mg Intravenous Q12H  . multivitamin with minerals  1 tablet Oral Daily  . nicotine  21 mg Transdermal Daily  . thiamine  100 mg Oral Daily   Or  . thiamine  100 mg Intravenous Daily   Continuous Infusions: . ampicillin-sulbactam (UNASYN) IV 3 g (02/21/21 0913)     LOS:  1 day    Time spent: 32 minutes    Sharen Hones, MD [facility]   To contact the attending provider between 7A-7P or the covering provider during after hours 7P-7A, please log into the web site [email or web address] and access using universal [place] password for that web site. If you do not have the password, please call the hospital operator.  02/21/2021, 1:29 PM

## 2021-02-21 NOTE — Progress Notes (Signed)
[facility] CLINIC INFECTIOUS DISEASE PROGRESS NOTE Date of Admission:  02/20/2021     ID: Lance Green is a 45 y.o. male with cavitary pna Principal Problem:   Cavitary pneumonia Active Problems:   Seizure (HCC)   Alcohol abuse   Sepsis (HCC)   Normocytic anemia   HTN (hypertension)   Subjective: Low-grade fevers persist.  Sputum gram stain is negative culture pending.  MRSA PCR negative.  HIV negative.  ROS  Eleven systems are reviewed and negative except per hpi  Medications:  Antibiotics Given (last 72 hours)    Date/Time Action Medication Dose Rate   02/20/21 1122 New Bag/Given   Ampicillin-Sulbactam (UNASYN) 3 g in sodium chloride 0.9 % 100 mL IVPB 3 g 200 mL/hr   02/20/21 1407 New Bag/Given   clindamycin (CLEOCIN) IVPB 600 mg 600 mg 100 mL/hr   02/20/21 1625 New Bag/Given   Ampicillin-Sulbactam (UNASYN) 3 g in sodium chloride 0.9 % 100 mL IVPB 3 g 200 mL/hr   02/20/21 2334 New Bag/Given   vancomycin (VANCOREADY) IVPB 1500 mg/300 mL 1,500 mg 150 mL/hr   02/21/21 0220 New Bag/Given   Ampicillin-Sulbactam (UNASYN) 3 g in sodium chloride 0.9 % 100 mL IVPB 3 g 200 mL/hr   02/21/21 8527 New Bag/Given   Ampicillin-Sulbactam (UNASYN) 3 g in sodium chloride 0.9 % 100 mL IVPB 3 g 200 mL/hr   02/21/21 0913 New Bag/Given   Ampicillin-Sulbactam (UNASYN) 3 g in sodium chloride 0.9 % 100 mL IVPB 3 g 200 mL/hr     . enoxaparin (LOVENOX) injection  40 mg Subcutaneous Q24H  . folic acid  1 mg Oral Daily  . levETIRAcetam  250 mg Oral BID  . LORazepam  0-4 mg Intravenous Q6H   Followed by  . [START ON 02/22/2021] LORazepam  0-4 mg Intravenous Q12H  . multivitamin with minerals  1 tablet Oral Daily  . nicotine  21 mg Transdermal Daily  . thiamine  100 mg Oral Daily   Or  . thiamine  100 mg Intravenous Daily    Objective: Vital signs in last 24 hours: Temp:  [97.9 F (36.6 C)-100.4 F (38 C)] 99.9 F (37.7 C) (05/19 1151) Pulse Rate:  [80-112] 103 (05/19 1310) Resp:  [16-66]  66 (05/19 1310) BP: (86-139)/(57-106) 91/64 (05/19 1310) SpO2:  [98 %-100 %] 100 % (05/19 1310) Physical Exam  Constitutional: He is oriented to person, place, and time. He appears well-developed and well-nourished. No distress.  HENT:  Mouth/Throat: Oropharynx is clear and moist. No oropharyngeal exudate.  Cardiovascular: Normal rate, regular rhythm and normal heart sounds. Exam reveals no gallop and no friction rub.  No murmur heard.  Pulmonary/Chest: Effort normal and breath sounds normal. No respiratory distress. He has no wheezes.  Abdominal: Soft. Bowel sounds are normal. He exhibits no distension. There is no tenderness.  Lymphadenopathy:  He has no cervical adenopathy.  Neurological: He is alert and oriented to person, place, and time.  Skin: Skin is warm and dry. No rash noted. No erythema.  Psychiatric: He has a normal mood and affect. His behavior is normal.    Lab Results Recent Labs    02/20/21 0753 02/21/21 0443  WBC 11.2* 10.8*  HGB 10.0* 9.5*  HCT 28.2* 27.9*  NA 134* 135  K 3.8 3.5  CL 98 101  CO2 24 23  BUN <5* 5*  CREATININE 0.78 0.71    Microbiology: Results for orders placed or performed during the hospital encounter of 02/20/21  Culture, blood (routine  x 2)     Status: None (Preliminary result)   Collection Time: 02/20/21  8:56 AM   Specimen: BLOOD  Result Value Ref Range Status   Specimen Description BLOOD BLOOD LEFT WRIST  Final   Special Requests   Final    BOTTLES DRAWN AEROBIC AND ANAEROBIC Blood Culture adequate volume   Culture   Final    NO GROWTH < 24 HOURS Performed at [facility], [address], [place], [place] [address]    Report Status PENDING  Incomplete  Culture, blood (routine x 2)     Status: None (Preliminary result)   Collection Time: 02/20/21  8:56 AM   Specimen: BLOOD  Result Value Ref Range Status   Specimen Description BLOOD BLOOD RIGHT FOREARM  Final   Special Requests   Final    BOTTLES DRAWN AEROBIC AND  ANAEROBIC Blood Culture results may not be optimal due to an inadequate volume of blood received in culture bottles   Culture   Final    NO GROWTH < 24 HOURS Performed at [facility], [address], [place], [place] [address]    Report Status PENDING  Incomplete  Resp Panel by RT-PCR (Flu A&B, Covid) Nasopharyngeal Swab     Status: None   Collection Time: 02/20/21  8:56 AM   Specimen: Nasopharyngeal Swab; Nasopharyngeal(NP) swabs in vial transport medium  Result Value Ref Range Status   SARS Coronavirus 2 by RT PCR NEGATIVE NEGATIVE Final    Comment: (NOTE) SARS-CoV-2 target nucleic acids are NOT DETECTED.  The SARS-CoV-2 RNA is generally detectable in upper respiratory specimens during the acute phase of infection. The lowest concentration of SARS-CoV-2 viral copies this assay can detect is 138 copies/mL. A negative result does not preclude SARS-Cov-2 infection and should not be used as the sole basis for treatment or other patient management decisions. A negative result may occur with  improper specimen collection/handling, submission of specimen other than nasopharyngeal swab, presence of viral mutation(s) within the areas targeted by this assay, and inadequate number of viral copies(<138 copies/mL). A negative result must be combined with clinical observations, patient history, and epidemiological information. The expected result is Negative.  Fact Sheet for Patients:  BloggerCourse.com  Fact Sheet for Healthcare Providers:  SeriousBroker.it  This test is no t yet approved or cleared by the [place] FDA and  has been authorized for detection and/or diagnosis of SARS-CoV-2 by FDA under an Emergency Use Authorization (EUA). This EUA will remain  in effect (meaning this test can be used) for the duration of the COVID-19 declaration under Section 564(b)(1) of the Act, 21 U.S.C.section 360bbb-3(b)(1), unless the  authorization is terminated  or revoked sooner.       Influenza A by PCR NEGATIVE NEGATIVE Final   Influenza B by PCR NEGATIVE NEGATIVE Final    Comment: (NOTE) The Xpert Xpress SARS-CoV-2/FLU/RSV plus assay is intended as an aid in the diagnosis of influenza from Nasopharyngeal swab specimens and should not be used as a sole basis for treatment. Nasal washings and aspirates are unacceptable for Xpert Xpress SARS-CoV-2/FLU/RSV testing.  Fact Sheet for Patients: BloggerCourse.com  Fact Sheet for Healthcare Providers: SeriousBroker.it  This test is not yet approved or cleared by the [place] FDA and has been authorized for detection and/or diagnosis of SARS-CoV-2 by FDA under an Emergency Use Authorization (EUA). This EUA will remain in effect (meaning this test can be used) for the duration of the COVID-19 declaration under Section 564(b)(1) of the  Act, 21 U.S.C. section 360bbb-3(b)(1), unless the authorization is terminated or revoked.  Performed at [facility], [address] [place], [place] [address]   Culture, sputum-assessment     Status: None   Collection Time: 02/20/21 11:00 PM   Specimen: Sputum  Result Value Ref Range Status   Specimen Description SPUTUM  Final   Special Requests NONE  Final   Sputum evaluation   Final    THIS SPECIMEN IS ACCEPTABLE FOR SPUTUM CULTURE Performed at [facility], [address] [place], [place] [address]    Report Status 02/21/2021 FINAL  Final  MRSA PCR Screening     Status: None   Collection Time: 02/20/21 11:00 PM   Specimen: Nasal Mucosa; Nasopharyngeal  Result Value Ref Range Status   MRSA by PCR NEGATIVE NEGATIVE Final    Comment:        The GeneXpert MRSA Assay (FDA approved for NASAL specimens only), is one component of a comprehensive MRSA colonization surveillance program. It is not intended to diagnose MRSA infection nor to guide  or monitor treatment for MRSA infections. Performed at [facility], [address] [place], [place] [address]   Culture, Respiratory w Gram Stain     Status: None (Preliminary result)   Collection Time: 02/20/21 11:00 PM   Specimen: SPU  Result Value Ref Range Status   Specimen Description   Final    SPUTUM Performed at [facility], [address] [place], [place] [address]    Special Requests   Final    NONE Reflexed from [id number] Performed at [facility], [address] [place], [place] [address]    Gram Stain   Final    RARE WBC PRESENT, PREDOMINANTLY PMN FEW GRAM POSITIVE COCCI IN PAIRS IN CLUSTERS FEW GRAM NEGATIVE RODS Performed at [facility], [address], [place], [place] [address]    Culture PENDING  Incomplete   Report Status PENDING  Incomplete    Studies/Results: DG Chest 2 View  Result Date: 02/20/2021 CLINICAL DATA:  Left-sided chest pain and cough beginning 2 weeks ago EXAM: CHEST - 2 VIEW COMPARISON:  06/09/2019 FINDINGS: Focal opacity noted in the left upper lobe with interspersed regions of lucency. Lungs otherwise clear. Cardiomediastinal silhouette and pulmonary vasculature are within normal limits. IMPRESSION: Airspace opacity in the left upper lobe suspicious for cavitary pneumonia. Consider further evaluation with contrast enhanced chest CT. Radiographic follow-up to resolution should be performed to exclude malignancy. Electronically Signed   By: Acquanetta Belling M.D.   On: 02/20/2021 08:27   CT Chest W Contrast  Result Date: 02/20/2021 CLINICAL DATA:  45 year old male with history of pneumonia. Abnormal chest x-ray. EXAM: CT CHEST WITH CONTRAST TECHNIQUE: Multidetector CT imaging of the chest was performed during intravenous contrast administration. CONTRAST:  77mL OMNIPAQUE IOHEXOL 300 MG/ML  SOLN COMPARISON:  No prior chest CT.  Chest x-ray 02/20/2021. FINDINGS: Cardiovascular: Heart size is normal. There is no  significant pericardial fluid, thickening or pericardial calcification. No atherosclerotic calcifications in the thoracic aorta or the coronary arteries. Mediastinum/Nodes: No pathologically enlarged mediastinal or hilar lymph nodes. Esophagus is unremarkable in appearance. No axillary lymphadenopathy. Lungs/Pleura: Large area of cavitation with thick walls in the posterior aspect of the left upper lobe near the apex with surrounding ground-glass attenuation, septal thickening and regional architectural distortion. Right lung is clear. No pleural effusions. Upper Abdomen: Aortic atherosclerosis. Diffuse low attenuation throughout the visualized hepatic parenchyma, indicative of hepatic steatosis. Morphologic changes  in the pancreas indicative of chronic pancreatitis, including extensive coarse parenchymal calcifications, generalized parenchymal atrophy and diffuse ductal dilatation. Musculoskeletal: There are no aggressive appearing lytic or blastic lesions noted in the visualized portions of the skeleton. IMPRESSION: 1. Probable necrotizing cavitary pneumonia in the left upper lobe, as above. Strictly speaking, cavitary neoplasm is not entirely excluded, but not strongly favored at this time. Follow-up noncontrast chest CT is recommended in 2-3 months to re-evaluate this region after trial of antimicrobial therapy. 2. Morphologic changes in the pancreas indicative of chronic pancreatitis. 3. Hepatic steatosis. Electronically Signed   By: Trudie Reedaniel  Entrikin M.D.   On: 02/20/2021 10:13    Assessment/Plan: Lance Green is a 45 y.o. male with hx of seizure disorder, ETOH abuse, peripheral neuropathy admitted with 2 weeks chest pain and cough and found to have fever and a cavitary LUL necrotizing Pneumonia. DIff is most likely aspiration given history of seizure disorder and ETOH use, however TB remains in differential and should be ruled out  Recommendations Since MRSA PCR negative can DC vancomycin.    Continue Unasyn.   Continue isolation pending serial AFB and TB PCR Pending routine culture I suspect this is all aspiration pneumonia.  Once AFB negative can dc home.  He will need a prolonged course of oral Augmentin at discharge with serial follow-up imaging to ensure resolution. I can see 2 weeks after dc. Please send home with a min 14 day supply of augmentin unless cultures reveal resistant organisms. ID will not be available in house for the next week. [facility] will cover via telemedicine. Please call oncall ID physician with questions.  Mick SellDavid P Jayleon Mcfarlane   02/21/2021, 1:29 PM

## 2021-02-22 LAB — CBC WITH DIFFERENTIAL/PLATELET
Abs Immature Granulocytes: 0.04 10*3/uL (ref 0.00–0.07)
Basophils Absolute: 0 10*3/uL (ref 0.0–0.1)
Basophils Relative: 0 %
Eosinophils Absolute: 0 10*3/uL (ref 0.0–0.5)
Eosinophils Relative: 0 %
HCT: 23.2 % — ABNORMAL LOW (ref 39.0–52.0)
Hemoglobin: 8.1 g/dL — ABNORMAL LOW (ref 13.0–17.0)
Immature Granulocytes: 1 %
Lymphocytes Relative: 19 %
Lymphs Abs: 1.6 10*3/uL (ref 0.7–4.0)
MCH: 30.8 pg (ref 26.0–34.0)
MCHC: 34.9 g/dL (ref 30.0–36.0)
MCV: 88.2 fL (ref 80.0–100.0)
Monocytes Absolute: 1.2 10*3/uL — ABNORMAL HIGH (ref 0.1–1.0)
Monocytes Relative: 14 %
Neutro Abs: 5.6 10*3/uL (ref 1.7–7.7)
Neutrophils Relative %: 66 %
Platelets: 263 10*3/uL (ref 150–400)
RBC: 2.63 MIL/uL — ABNORMAL LOW (ref 4.22–5.81)
RDW: 13.1 % (ref 11.5–15.5)
WBC: 8.4 10*3/uL (ref 4.0–10.5)
nRBC: 0 % (ref 0.0–0.2)

## 2021-02-22 LAB — ACID FAST SMEAR (AFB, MYCOBACTERIA)
Acid Fast Smear: NEGATIVE
Acid Fast Smear: NEGATIVE

## 2021-02-22 LAB — BASIC METABOLIC PANEL
Anion gap: 6 (ref 5–15)
BUN: <5 mg/dL — ABNORMAL LOW (ref 6–20)
CO2: 23 mmol/L (ref 22–32)
Calcium: 7.7 mg/dL — ABNORMAL LOW (ref 8.9–10.3)
Chloride: 107 mmol/L (ref 98–111)
Creatinine, Ser: 0.67 mg/dL (ref 0.61–1.24)
GFR, Estimated: >60 mL/min (ref >60)
Glucose, Bld: 109 mg/dL — ABNORMAL HIGH (ref 70–99)
Potassium: 3.1 mmol/L — ABNORMAL LOW (ref 3.5–5.1)
Sodium: 136 mmol/L (ref 135–145)

## 2021-02-22 LAB — LEGIONELLA PNEUMOPHILA SEROGP 1 UR AG: L. pneumophila Serogp 1 Ur Ag: NEGATIVE

## 2021-02-22 LAB — MAGNESIUM: Magnesium: 1.2 mg/dL — ABNORMAL LOW (ref 1.7–2.4)

## 2021-02-22 MED ORDER — AMOXICILLIN-POT CLAVULANATE 875-125 MG PO TABS: 1.0000 | ORAL_TABLET | Freq: Two times a day (BID) | ORAL | 0 refills | Status: AC

## 2021-02-22 MED ORDER — POTASSIUM CHLORIDE 10 MEQ/100ML IV SOLN
10.0000 meq | Freq: Once | INTRAVENOUS | Status: AC
  Administered 2021-02-22: 10 meq via INTRAVENOUS
  Filled 2021-02-22: qty 100

## 2021-02-22 MED ORDER — POTASSIUM CHLORIDE 10 MEQ/100ML IV SOLN
10.0000 meq | INTRAVENOUS | Status: AC
  Administered 2021-02-22 (×2): 10 meq via INTRAVENOUS
  Filled 2021-02-22 (×2): qty 100

## 2021-02-22 MED ORDER — AMOXICILLIN-POT CLAVULANATE 875-125 MG PO TABS
1.0000 | ORAL_TABLET | Freq: Two times a day (BID) | ORAL | Status: DC
  Administered 2021-02-22: 1 via ORAL
  Filled 2021-02-22: qty 1

## 2021-02-22 MED ORDER — AMOXICILLIN-POT CLAVULANATE 875-125 MG PO TABS: 1.0000 | ORAL_TABLET | Freq: Two times a day (BID) | ORAL | 0 refills | Status: DC

## 2021-02-22 MED ORDER — MAGNESIUM SULFATE 2 GM/50ML IV SOLN
2.0000 g | Freq: Once | INTRAVENOUS | Status: AC
  Administered 2021-02-22: 2 g via INTRAVENOUS
  Filled 2021-02-22: qty 50

## 2021-02-22 MED ORDER — MAGNESIUM SULFATE 50 % IJ SOLN
3.0000 g | Freq: Once | INTRAVENOUS | Status: AC
  Administered 2021-02-22: 3 g via INTRAVENOUS
  Filled 2021-02-22: qty 6

## 2021-02-22 NOTE — TOC Progression Note (Addendum)
Transition of Care [facility]) - Progression Note    Patient Details  Name: Lance Green MRN: [id number] Date of Birth: [birth year]  Transition of [facility] CM/SW Contact  Caryn Section, RN Phone Number: 02/22/2021, 9:01 AM  Clinical Narrative:   TOC spoke to patient in room via phone due to isolation status.  Patient lives at home with his mother, no current home health or other services.  Patient has no concerns about returning home following discharge, no concerns about getting medications or getting to appointments.  States he does not have TOC needs at this time.  TOC contact information given.  Addendum:  Provided patient with [facility] for [facility], referral sent.  Provided patient with substance abuse counseling information.  Patient amenable to both.  Expected Discharge Plan: Home/Self Care Barriers to Discharge: Continued Medical Work up  Expected Discharge Plan and Services Expected Discharge Plan: Home/Self Care   Discharge Planning Services: CM Consult   Living arrangements for the past 2 months: Single Family Home                                       Social Determinants of Health (SDOH) Interventions    Readmission Risk Interventions No flowsheet data found.

## 2021-02-22 NOTE — Discharge Instructions (Signed)
Community-Acquired Pneumonia, Adult Pneumonia is an infection of the lungs. It causes irritation and swelling in the airways of the lungs. Mucus and fluid may also build up inside the airways. This may cause coughing and trouble breathing. One type of pneumonia can happen while you are in a hospital. A different type can happen when you are not in a hospital (community-acquired pneumonia). What are the causes? This condition is caused by germs (viruses, bacteria, or fungi). Some types of germs can spread from person to person. Pneumonia is not thought to spread from person to person.   What increases the risk? You are more likely to develop this condition if:  You have a long-term (chronic) disease, such as: ? Disease of the lungs. This may be chronic obstructive pulmonary disease (COPD) or asthma. ? Heart failure. ? Cystic fibrosis. ? Diabetes. ? Kidney disease. ? Sickle cell disease. ? HIV.  You have other health problems, such as: ? Your body's defense system (immune system) is weak. ? A condition that may cause you to breathe in fluids from your mouth and nose.  You had your spleen taken out.  You do not take good care of your teeth and mouth (poor dental hygiene).  You use or have used tobacco products.  You travel where the germs that cause this illness are common.  You are near certain animals or the places they live.  You are older than 47 years of age. What are the signs or symptoms? Symptoms of this condition include:  A cough.  A fever.  Sweating or chills.  Chest pain, often when you breathe deeply or cough.  Breathing problems, such as: ? Fast breathing. ? Trouble breathing. ? Shortness of breath.  Feeling tired (fatigued).  Muscle aches. How is this treated? Treatment for this condition depends on many things, such as:  The cause of your illness.  Your medicines.  Your other health problems. Most adults can be treated at home. Sometimes,  treatment must happen in a hospital.  Treatment may include medicines to kill germs.  Medicines may depend on which germ caused your illness. Very bad pneumonia is rare. If you get it, you may:  Have a machine to help you breathe.  Have fluid taken away from around your lungs. Follow these instructions at home: Medicines  Take over-the-counter and prescription medicines only as told by your doctor.  Take cough medicine only if you are losing sleep. Cough medicine can keep your body from taking mucus away from your lungs.  If you were prescribed an antibiotic medicine, take it as told by your doctor. Do not stop taking the antibiotic even if you start to feel better. Lifestyle  Do not drink alcohol.  Do not use any products that contain nicotine or tobacco, such as cigarettes, e-cigarettes, and chewing tobacco. If you need help quitting, ask your doctor.  Eat a healthy diet. This includes a lot of vegetables, fruits, whole grains, low-fat dairy products, and low-fat (lean) protein.      General instructions  Rest a lot. Sleep for at least 8 hours each night.  Sleep with your head and neck raised. Put a few pillows under your head or sleep in a reclining chair.  Return to your normal activities as told by your doctor. Ask your doctor what activities are safe for you.  Drink enough fluid to keep your pee (urine) pale yellow.  If your throat is sore, rinse your mouth often with salt water. To make salt   water, dissolve -1 tsp (3-6 g) of salt in 1 cup (237 mL) of warm water.  Keep all follow-up visits as told by your doctor. This is important.   How is this prevented? You can lower your risk of pneumonia by:  Getting the pneumonia shot (vaccine). These shots have different types and schedules. Ask your doctor what works best for you. Think about getting this shot if: ? You are older than 47 years of age. ? You are 19-65 years of age and:  You are being treated for  cancer.  You have long-term lung disease.  You have other problems that affect your body's defense system. Ask your doctor if you have one of these.  Getting your flu shot every year. Ask your doctor which type of shot is best for you.  Going to the dentist as often as told.  Washing your hands often with soap and water for at least 20 seconds. If you cannot use soap and water, use hand sanitizer. Contact a doctor if:  You have a fever.  You lose sleep because your cough medicine does not help. Get help right away if:  You are short of breath and this gets worse.  You have more chest pain.  Your sickness gets worse. This is very serious if: ? You are an older adult. ? Your body's defense system is weak.  You cough up blood. These symptoms may be an emergency. Do not wait to see if the symptoms will go away. Get medical help right away. Call your local emergency services (911 in the [place] Do not drive yourself to the hospital. Summary  Pneumonia is an infection of the lungs.  Community-acquired pneumonia affects people who have not been in the hospital. Certain germs can cause this infection.  This condition may be treated with medicines that kill germs.  For very bad pneumonia, you may need a hospital stay and treatment to help with breathing. This information is not intended to replace advice given to you by your health care provider. Make sure you discuss any questions you have with your health care provider. Document Revised: 07/05/2019 Document Reviewed: 07/05/2019 Elsevier Patient Education  2021 [employer]  

## 2021-02-22 NOTE — Progress Notes (Signed)
Pt given d/c education, pt aware of script to pick up at the pharmacy. Pt IV removed. Pt awaiting ride.

## 2021-02-22 NOTE — Discharge Summary (Addendum)
Physician Discharge Summary  Patient ID: Lance Green MRN: [id number] DOB/AGE: Mar 22, 1976 45 y.o.  Admit date: 02/20/2021 Discharge date: 02/22/2021  Admission Diagnoses:  Discharge Diagnoses:  Principal Problem:   Cavitary pneumonia Active Problems:   Seizure ([place])   Alcohol abuse   Sepsis ([place])   Normocytic anemia   HTN (hypertension)   Discharged Condition: good  Hospital Course:  Lance Green a 45 y.o.malewith medical history significant oftobacco and alcohol abuse, PUD, pancreatitis, seizure, peripheral neuropathy, anemia, who presents with chest pain, cough and fever.  Chest CT scan showed a large left upper lobe cavitary lesion, consistent with necrotizing pneumonia. Patient has been seen by infectious disease, started on vancomycin and Unasyn.  1.  Severe sepsis secondary to pneumonia. Left upper lobe cavitary pneumonia. Hypotension. Patient met sepsis criteria time admission with tachycardia and a high fever.  Patient also has a mild leukocytosis.  Patient developed hypotension today, receiving 1 L IV fluid bolus.  Lactic acid level still normal. Blood cultures negative so far. Sputum culture is also negative in 2 days. MRSA culture is negative. Appreciate ID consult. Patient is treated with vancomycin and Unasyn.  Vancomycin discontinued since yesterday. Acid-fast stain was negative x2.  I called the micro lab, they only received 2 sets, all negative. At this point, patient be followed by pulmonology and ID.  I also asked social worker to set up a PCP.  Patient condition most likely due to aspiration pneumonia from alcohol drinking.  But there is a slight possibility of malignancy.  That can be followed with pulmonology.  Hyponatremia Condition stable.  3.  Seizure disorder. Continue home medicine with Keppra.  4.  Essential hypertension. Continue to follow.  #5.  Hypomagnesemia. Hypokalemia These will be repleted before discharge.  #6.   Anemia of chronic disease Normal  iron B12 level.  #7.  Alcohol abuse. Patient does not have alcohol withdrawal while in the hospital.  Consults: ID  Significant Diagnostic Studies: CT CHEST WITH CONTRAST  TECHNIQUE: Multidetector CT imaging of the chest was performed during intravenous contrast administration.  CONTRAST:  46m OMNIPAQUE IOHEXOL 300 MG/ML  SOLN  COMPARISON:  No prior chest CT.  Chest x-ray 02/20/2021.  FINDINGS: Cardiovascular: Heart size is normal. There is no significant pericardial fluid, thickening or pericardial calcification. No atherosclerotic calcifications in the thoracic aorta or the coronary arteries.  Mediastinum/Nodes: No pathologically enlarged mediastinal or hilar lymph nodes. Esophagus is unremarkable in appearance. No axillary lymphadenopathy.  Lungs/Pleura: Large area of cavitation with thick walls in the posterior aspect of the left upper lobe near the apex with surrounding ground-glass attenuation, septal thickening and regional architectural distortion. Right lung is clear. No pleural effusions.  Upper Abdomen: Aortic atherosclerosis. Diffuse low attenuation throughout the visualized hepatic parenchyma, indicative of hepatic steatosis. Morphologic changes in the pancreas indicative of chronic pancreatitis, including extensive coarse parenchymal calcifications, generalized parenchymal atrophy and diffuse ductal dilatation.  Musculoskeletal: There are no aggressive appearing lytic or blastic lesions noted in the visualized portions of the skeleton.  IMPRESSION: 1. Probable necrotizing cavitary pneumonia in the left upper lobe, as above. Strictly speaking, cavitary neoplasm is not entirely excluded, but not strongly favored at this time. Follow-up noncontrast chest CT is recommended in 2-3 months to re-evaluate this region after trial of antimicrobial therapy. 2. Morphologic changes in the pancreas indicative of  chronic pancreatitis. 3. Hepatic steatosis.   Electronically Signed   By: DVinnie LangtonM.D.   On: 02/20/2021 10:13   Treatments: Unasyn.  Discharge Exam: Blood pressure 111/68, pulse 81, temperature 98.7 F (37.1 C), resp. rate 17, height 5' 9"  (1.753 m), weight 63.5 kg, SpO2 99 %. General appearance: alert and cooperative Resp: clear to auscultation bilaterally Cardio: regular rate and rhythm, S1, S2 normal, no murmur, click, rub or gallop GI: soft, non-tender; bowel sounds normal; no masses,  no organomegaly Extremities: extremities normal, atraumatic, no cyanosis or edema  Disposition: Discharge disposition: 01-Home or Self Care       Discharge Instructions    Diet - low sodium heart healthy   Complete by: As directed    Increase activity slowly   Complete by: As directed      Allergies as of 02/22/2021   No Known Allergies     Medication List    TAKE these medications   amLODipine 2.5 MG tablet Commonly known as: NORVASC Take 1 tablet (2.5 mg total) by mouth daily.   amoxicillin-clavulanate 875-125 MG tablet Commonly known as: Augmentin Take 1 tablet by mouth 2 (two) times daily.   chlordiazePOXIDE 10 MG capsule Commonly known as: LIBRIUM Day 1-2: Take 1 tablet p.o. every 8 hours. Day 3-4: Take 1 tablet p.o. every 12 hours. Day 5-6: Take 1 tablet p.o. daily. Day 7: Stop   folic acid 1 MG tablet Commonly known as: FOLVITE Take 1 tablet (1 mg total) by mouth daily.   levETIRAcetam 250 MG tablet Commonly known as: KEPPRA Take 1 tablet (250 mg total) by mouth 2 (two) times daily.   levETIRAcetam 250 MG tablet Commonly known as: Keppra Take 1 tablet (250 mg total) by mouth 2 (two) times daily.   multivitamin with minerals Tabs tablet Take 1 tablet by mouth daily.   thiamine 100 MG tablet Take 1 tablet (100 mg total) by mouth daily.       Follow-up Information    Ottie Glazier, MD Follow up in 2 week(s).   Specialty: Pulmonary  Disease Contact information: [place] [address] [phone]        Leonel Ramsay, MD Follow up in 4 week(s).   Specialty: Infectious Diseases Contact information: [place] [address] [phone]              32 minutes Signed: Sharen Hones 02/22/2021, 10:15 AM

## 2021-02-23 LAB — CULTURE, RESPIRATORY W GRAM STAIN: Culture: NORMAL

## 2021-02-23 LAB — ACID FAST SMEAR (AFB, MYCOBACTERIA): Acid Fast Smear: NEGATIVE

## 2021-02-25 LAB — CULTURE, BLOOD (ROUTINE X 2)
Culture: NO GROWTH
Culture: NO GROWTH
Special Requests: ADEQUATE

## 2021-02-25 LAB — MTB RIF NAA W/O CULTURE, SPUTUM

## 2021-03-07 ENCOUNTER — Ambulatory Visit: Payer: Self-pay | Admitting: Gerontology

## 2021-03-31 ENCOUNTER — Emergency Department: Payer: Medicaid Other

## 2021-03-31 ENCOUNTER — Emergency Department
Admission: EM | Admit: 2021-03-31 | Discharge: 2021-03-31 | Disposition: A | Discharge status: Home / Self Care | Payer: Medicaid Other | Attending: Emergency Medicine | Admitting: Emergency Medicine

## 2021-03-31 ENCOUNTER — Other Ambulatory Visit: Payer: Self-pay

## 2021-03-31 DIAGNOSIS — F17210 Nicotine dependence, cigarettes, uncomplicated: Secondary | ICD-10-CM | POA: Insufficient documentation

## 2021-03-31 DIAGNOSIS — J690 Pneumonitis due to inhalation of food and vomit: Secondary | ICD-10-CM | POA: Insufficient documentation

## 2021-03-31 DIAGNOSIS — M6281 Muscle weakness (generalized): Secondary | ICD-10-CM | POA: Insufficient documentation

## 2021-03-31 DIAGNOSIS — R296 Repeated falls: Secondary | ICD-10-CM | POA: Insufficient documentation

## 2021-03-31 DIAGNOSIS — I10 Essential (primary) hypertension: Secondary | ICD-10-CM | POA: Insufficient documentation

## 2021-03-31 DIAGNOSIS — F1010 Alcohol abuse, uncomplicated: Secondary | ICD-10-CM | POA: Insufficient documentation

## 2021-03-31 DIAGNOSIS — Y908 Blood alcohol level of 240 mg/100 ml or more: Secondary | ICD-10-CM | POA: Insufficient documentation

## 2021-03-31 DIAGNOSIS — R531 Weakness: Secondary | ICD-10-CM

## 2021-03-31 LAB — URINALYSIS, COMPLETE (UACMP) WITH MICROSCOPIC
Bacteria, UA: NONE SEEN
Bilirubin Urine: NEGATIVE
Glucose, UA: NEGATIVE mg/dL
Hgb urine dipstick: NEGATIVE
Ketones, ur: NEGATIVE mg/dL
Leukocytes,Ua: NEGATIVE
Nitrite: NEGATIVE
Protein, ur: NEGATIVE mg/dL
Specific Gravity, Urine: 1.004 — ABNORMAL LOW (ref 1.005–1.030)
pH: 5 (ref 5.0–8.0)

## 2021-03-31 LAB — BASIC METABOLIC PANEL
Anion gap: 10 (ref 5–15)
BUN: 5 mg/dL — ABNORMAL LOW (ref 6–20)
CO2: 22 mmol/L (ref 22–32)
Calcium: 7.8 mg/dL — ABNORMAL LOW (ref 8.9–10.3)
Chloride: 107 mmol/L (ref 98–111)
Creatinine, Ser: 0.81 mg/dL (ref 0.61–1.24)
GFR, Estimated: >60 mL/min (ref >60)
Glucose, Bld: 97 mg/dL (ref 70–99)
Potassium: 3.5 mmol/L (ref 3.5–5.1)
Sodium: 139 mmol/L (ref 135–145)

## 2021-03-31 LAB — CBC
HCT: 27.3 % — ABNORMAL LOW (ref 39.0–52.0)
Hemoglobin: 9.4 g/dL — ABNORMAL LOW (ref 13.0–17.0)
MCH: 32.9 pg (ref 26.0–34.0)
MCHC: 34.4 g/dL (ref 30.0–36.0)
MCV: 95.5 fL (ref 80.0–100.0)
Platelets: 208 10*3/uL (ref 150–400)
RBC: 2.86 MIL/uL — ABNORMAL LOW (ref 4.22–5.81)
RDW: 15.4 % (ref 11.5–15.5)
WBC: 7.3 10*3/uL (ref 4.0–10.5)
nRBC: 0 % (ref 0.0–0.2)

## 2021-03-31 LAB — MAGNESIUM: Magnesium: 1.5 mg/dL — ABNORMAL LOW (ref 1.7–2.4)

## 2021-03-31 LAB — TROPONIN I (HIGH SENSITIVITY)
Troponin I (High Sensitivity): 8 ng/L (ref <18)
Troponin I (High Sensitivity): 9 ng/L (ref <18)

## 2021-03-31 LAB — PROCALCITONIN: Procalcitonin: <0.1 ng/mL

## 2021-03-31 LAB — LIPASE, BLOOD: Lipase: 18 U/L (ref 11–51)

## 2021-03-31 IMAGING — DX DG PORTABLE PELVIS
1 series · 1 of 1 positions shown · non-contrast
Comparison: None.

CLINICAL DATA: Fall, weakness

EXAM:
PORTABLE PELVIS 1-2 VIEWS

[pelvis ap]
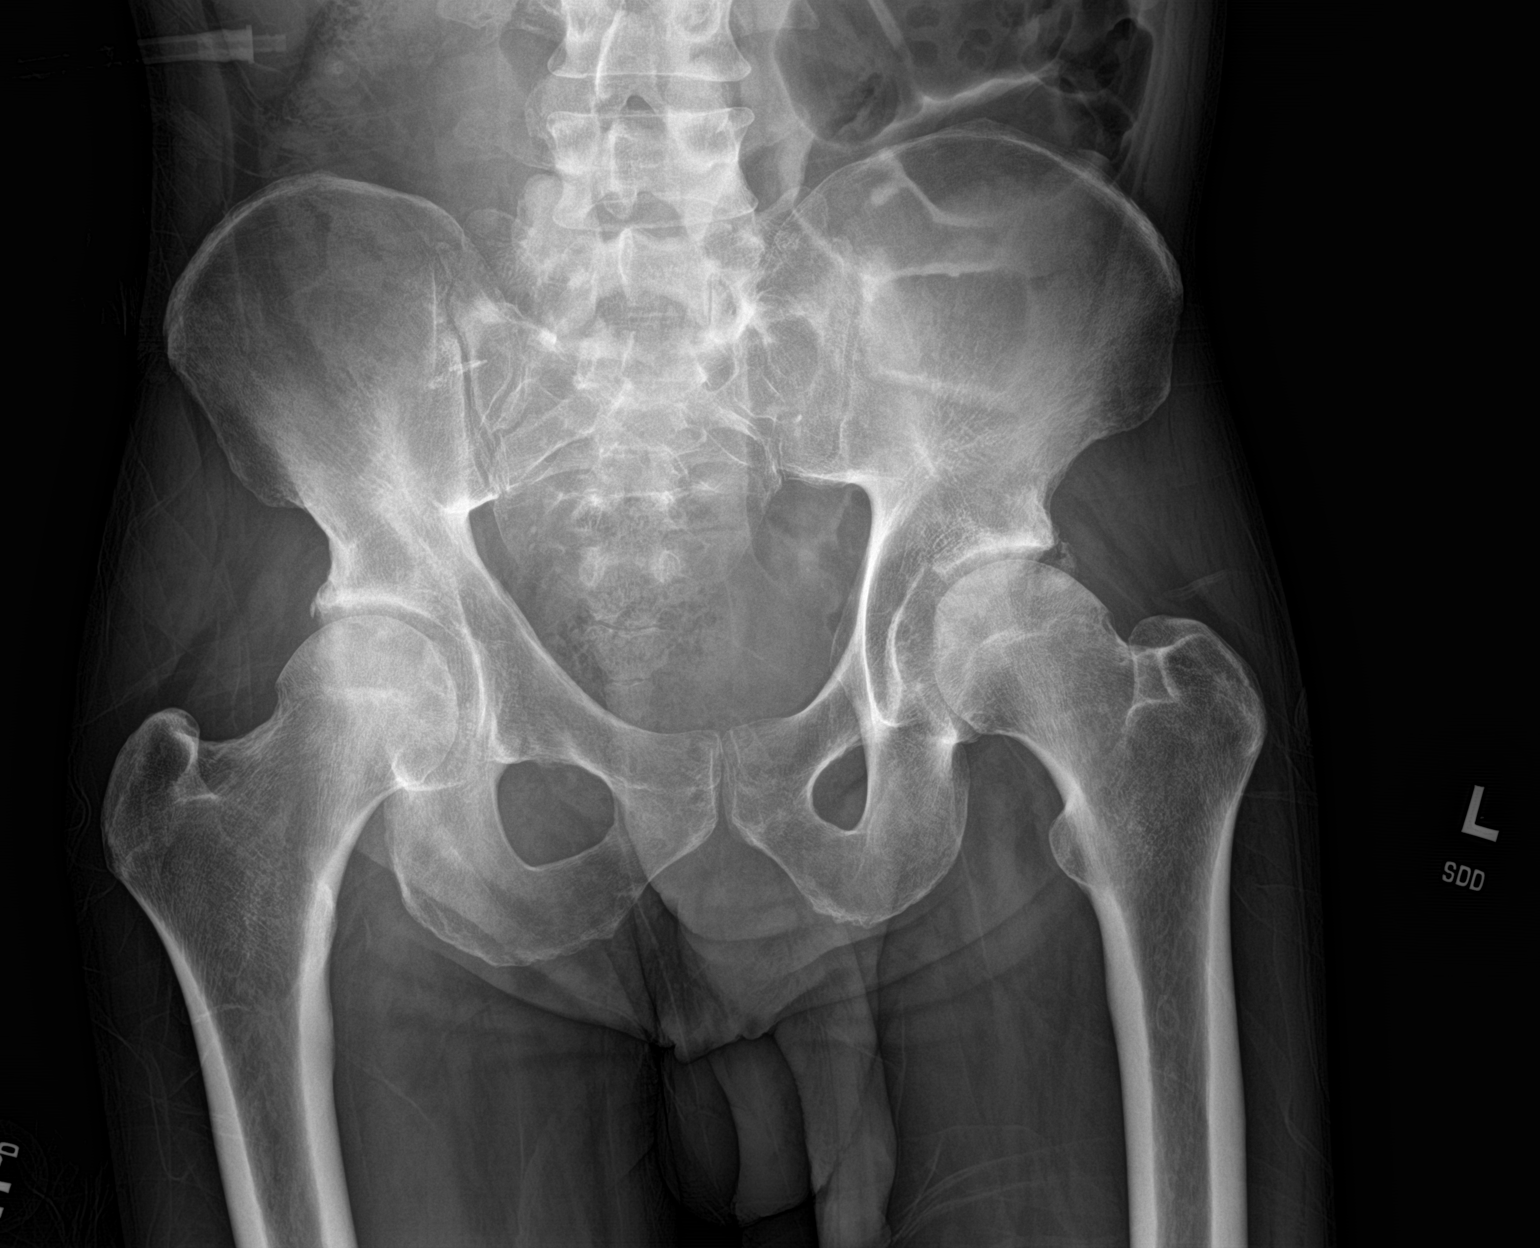

[1 of 1 positions shown; findings below may reference images not displayed]

FINDINGS: Early spurring in the hip joints bilaterally. No acute bony
abnormality. Specifically, no fracture, subluxation, or dislocation.
IMPRESSION: No acute bony abnormality.

## 2021-03-31 IMAGING — CT CT HEAD W/O CM
4 series · 16 of 47 positions shown, 18 images · non-contrast
Comparison: [DATE] [DATE], [DATE].  [DATE] [DATE], [DATE].

CLINICAL DATA: Head trauma. Weakness and multiple falls over the
last few days in a 44-year-old male.

EXAM:
CT HEAD WITHOUT CONTRAST
TECHNIQUE: Contiguous axial images were obtained from the base of the skull
through the vertex without intravenous contrast.

[Series 2: head wo · axial · 0.44mm/px · z∈[-127,-7]mm · 7 of 32 slices shown, 9 images]
[im 4/32  brain]
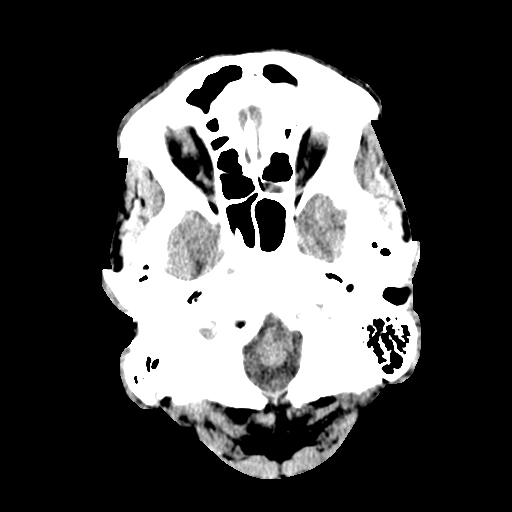
[im 4/32  bone]
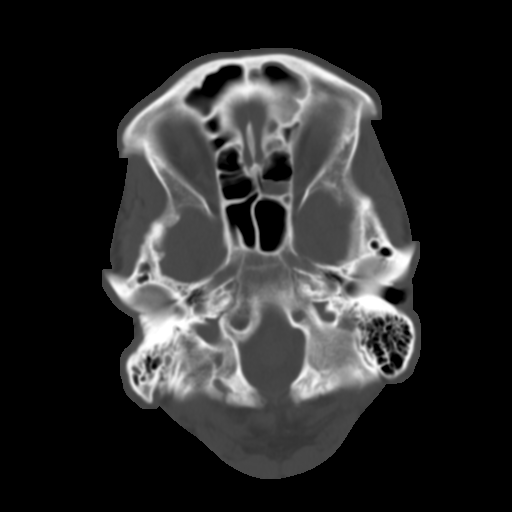
[im 8/32  brain]
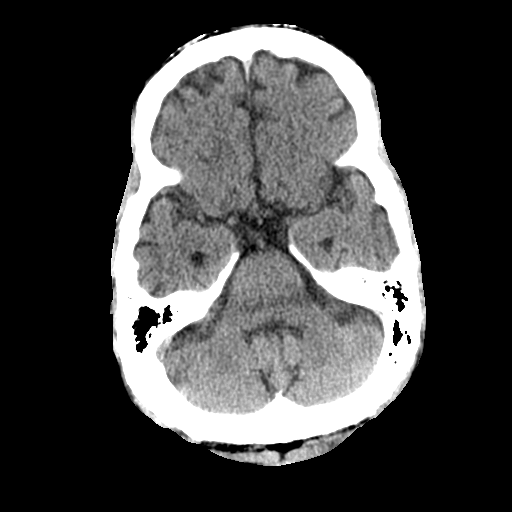
[im 12/32  brain]
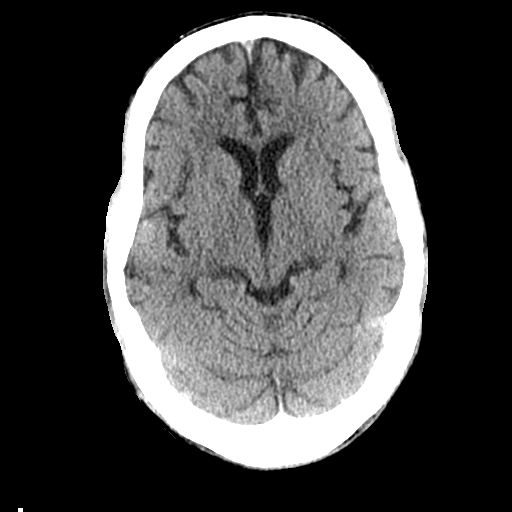
[im 16/32  brain]
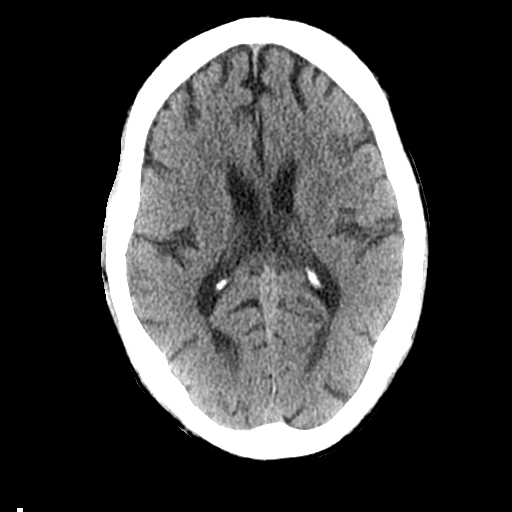
[im 20/32  brain]
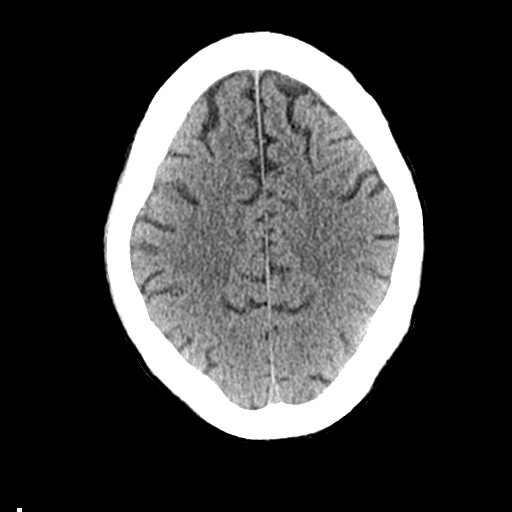
[im 20/32  bone]
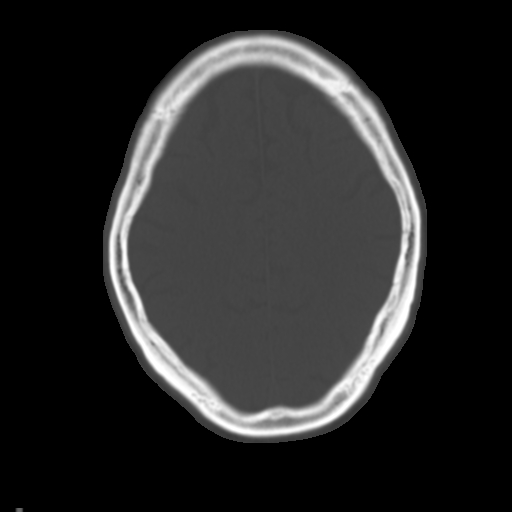
[im 24/32  brain]
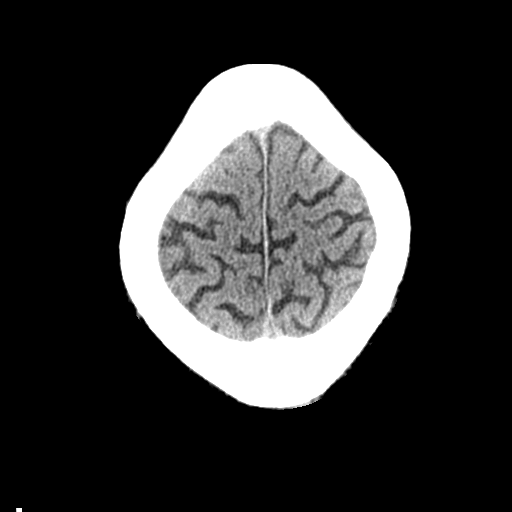
[im 28/32  brain]
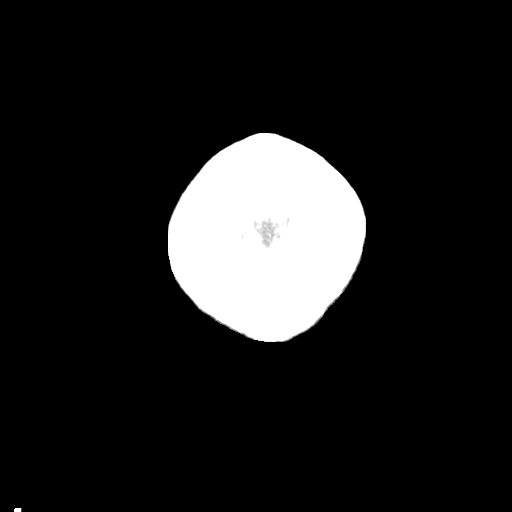

[Series 3: head bone · axial · 0.44mm/px · z∈[-128,-96]mm · 3 of 80 slices shown]
[im 8/80  bone]
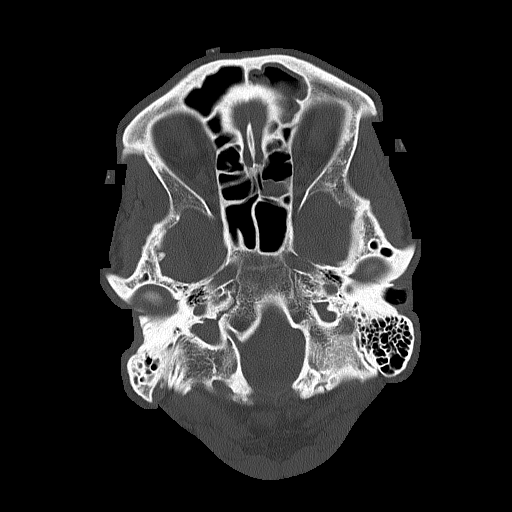
[im 16/80  bone]
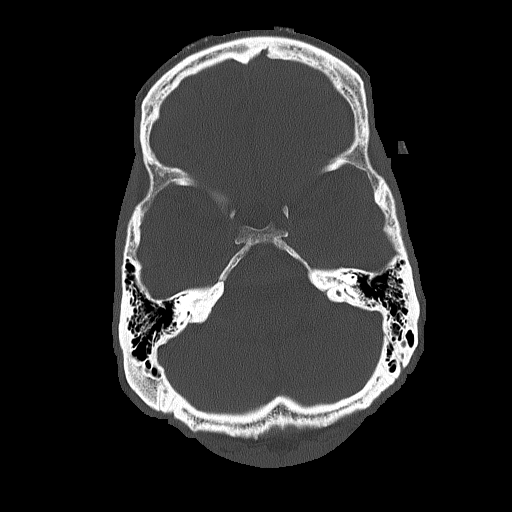
[im 24/80  bone]
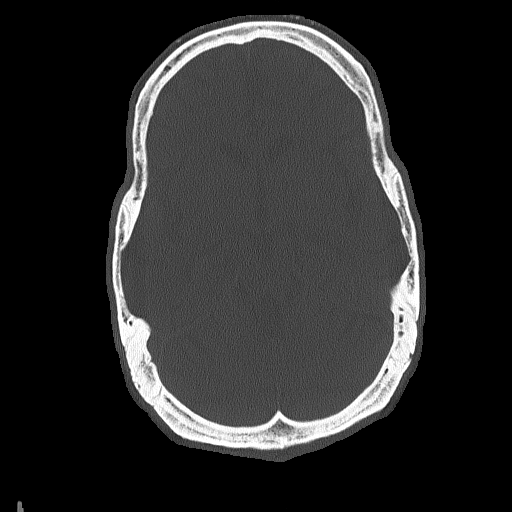

[Series 4: coronal soft tissue · coronal · 0.34mm/px · 3 of 68 slices shown]
[im 23/68  brain]
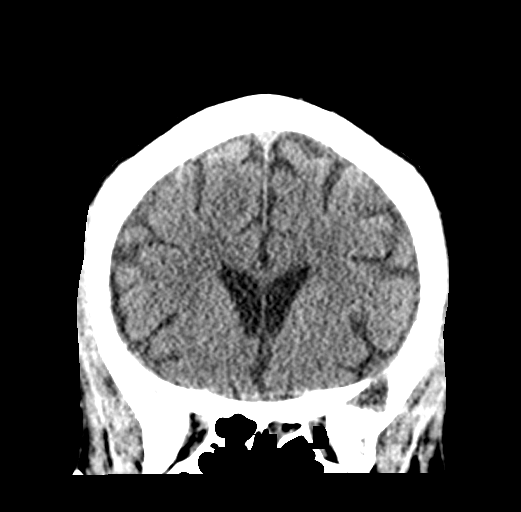
[im 30/68  brain]
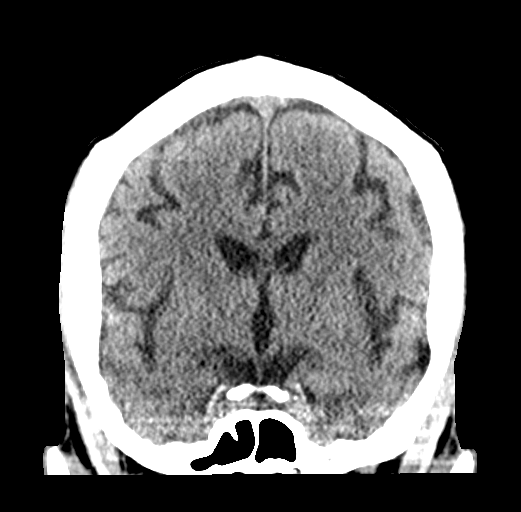
[im 38/68  brain]
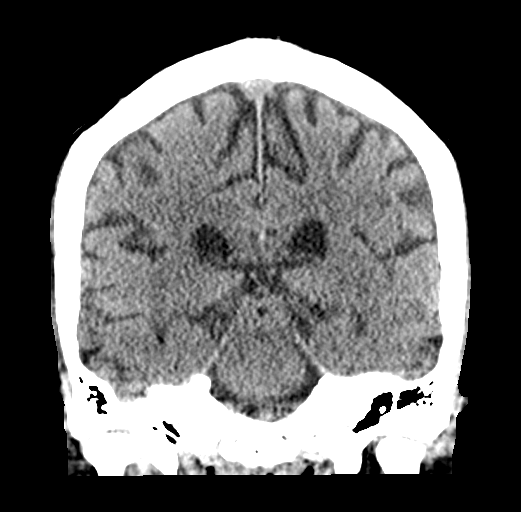

[Series 5: sagittal soft tissue · sagittal · 0.33mm/px · 3 of 50 slices shown]
[im 17/50  brain]
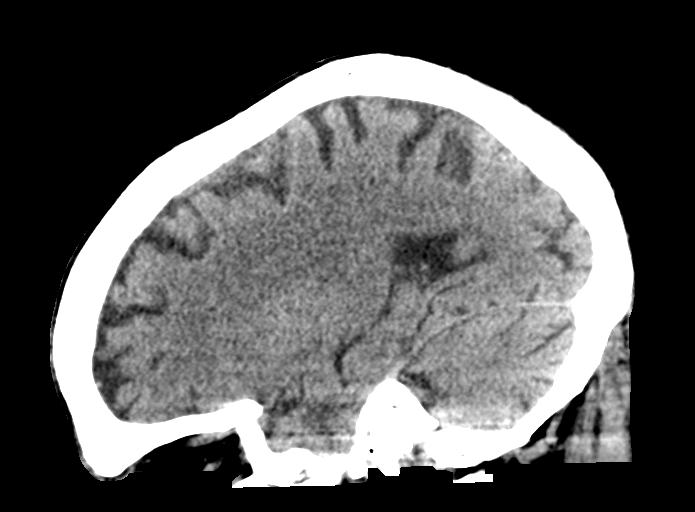
[im 25/50  brain]
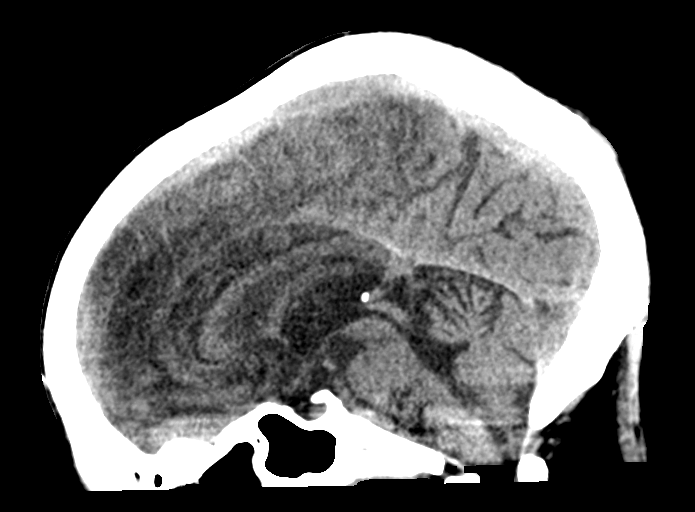
[im 33/50  brain]
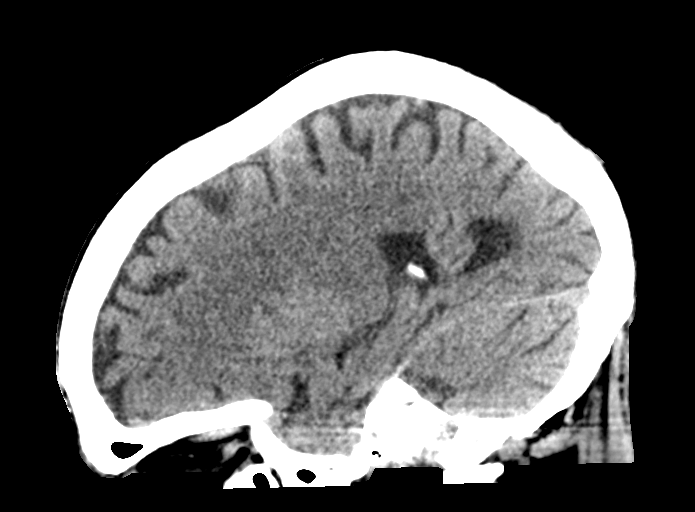

[16 of 47 positions shown; findings below may reference images not displayed]

FINDINGS: Brain: No evidence of acute infarction, hemorrhage, hydrocephalus,
extra-axial collection or mass lesion/mass effect. Signs of mild
atrophy as before.

Vascular: No hyperdense vessel or unexpected calcification.

Skull: Normal. Negative for fracture or focal lesion.

Sinuses/Orbits: Scattered ethmoid opacification. Chronic appearing
mucosal thickening of the LEFT frontal sinus. The visualized orbits
are unremarkable.

Other: None
IMPRESSION: 1. No acute intracranial abnormality.  Mild sinusitis.

## 2021-03-31 IMAGING — CT CT CERVICAL SPINE W/O CM
3 of 4 series · 12 of 33 positions shown, 14 images · non-contrast
Comparison: [DATE]

CLINICAL DATA: Fall, neck trauma

EXAM:
CT CERVICAL SPINE WITHOUT CONTRAST
TECHNIQUE: Multidetector CT imaging of the cervical spine was performed without
intravenous contrast. Multiplanar CT image reconstructions were also
generated.

[Series 6: orthogonal bone · axial · 0.24mm/px · z∈[-306,-172]mm · 4 of 109 slices shown, 5 images]
[im 19/109  soft-tissue]
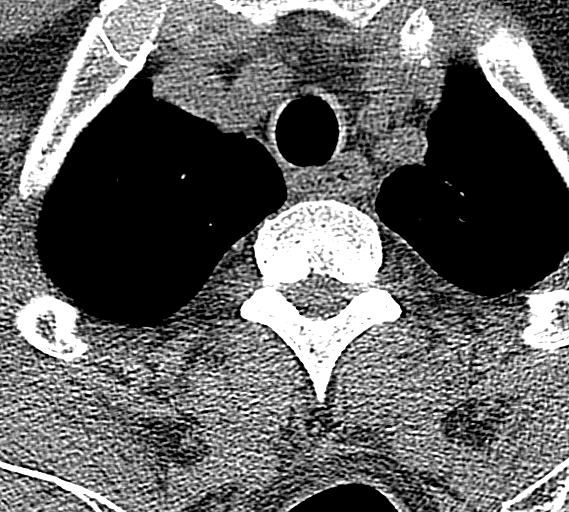
[im 19/109  bone]
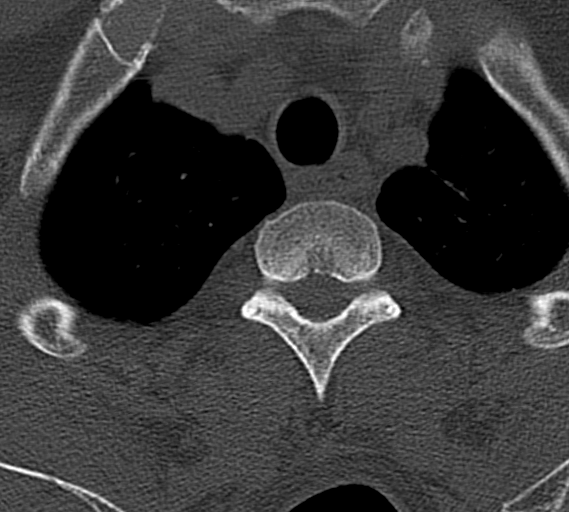
[im 37/109  bone]
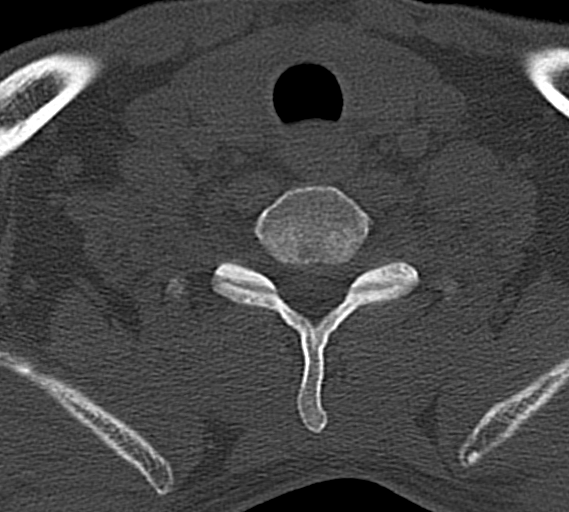
[im 73/109  bone]
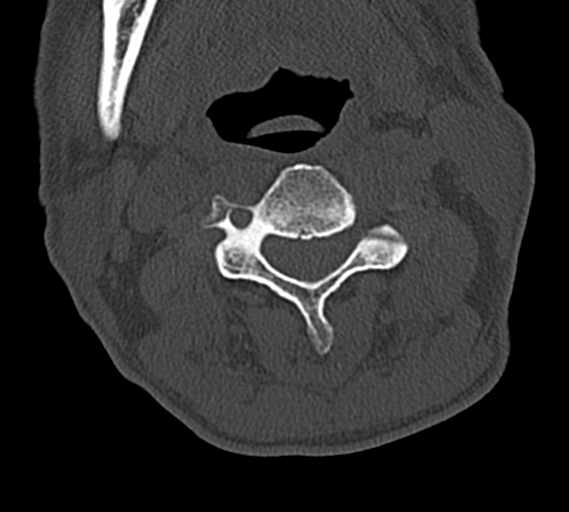
[im 91/109  bone]
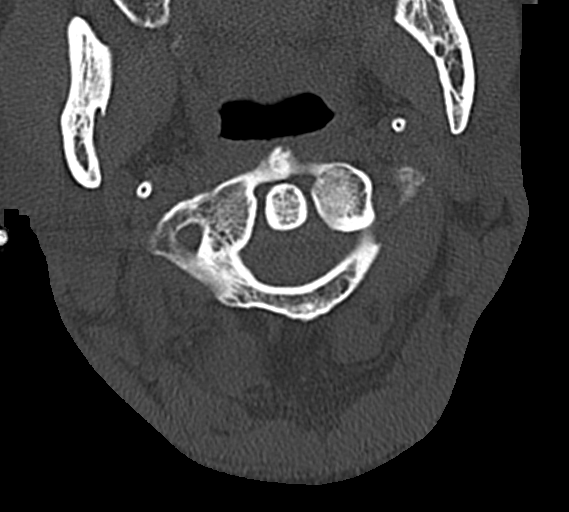

[Series 7: coronal bone · coronal · 0.28mm/px · 3 of 68 slices shown]
[im 15/68  bone]
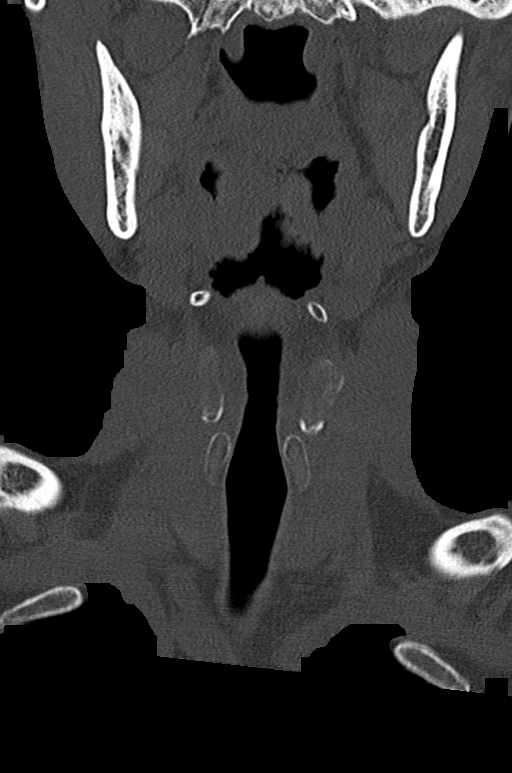
[im 28/68  bone]
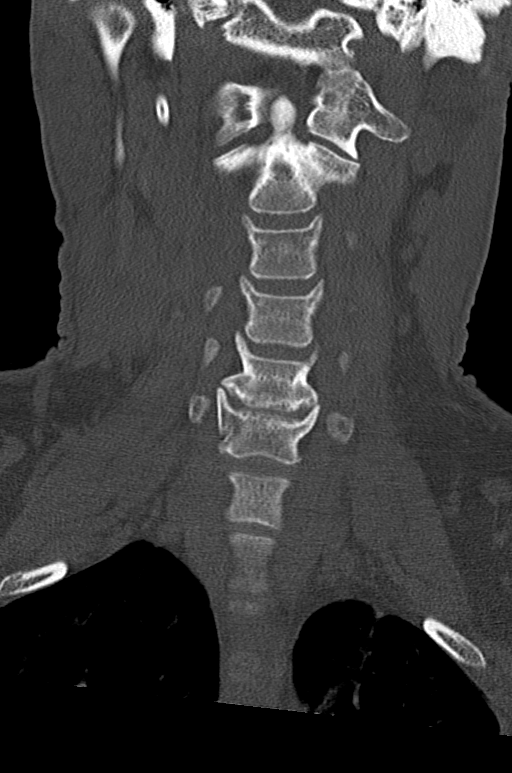
[im 40/68  bone]
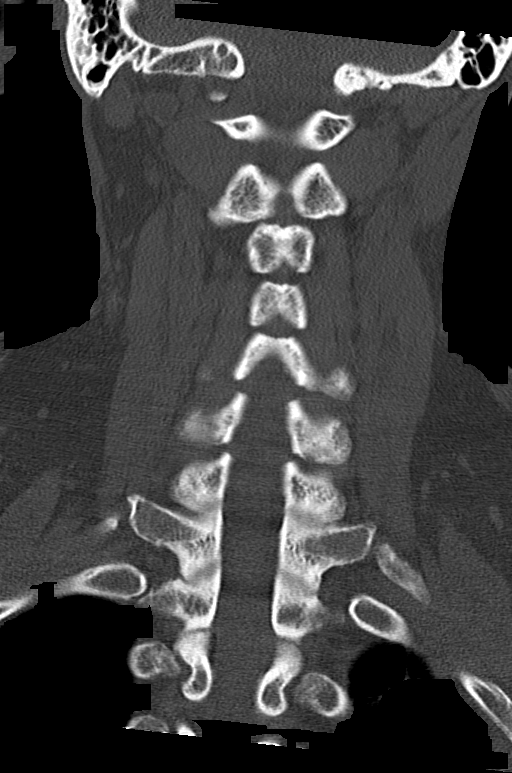

[Series 8: sagittal bone · sagittal · 0.26mm/px · 5 of 67 slices shown, 6 images]
[im 23/67  bone]
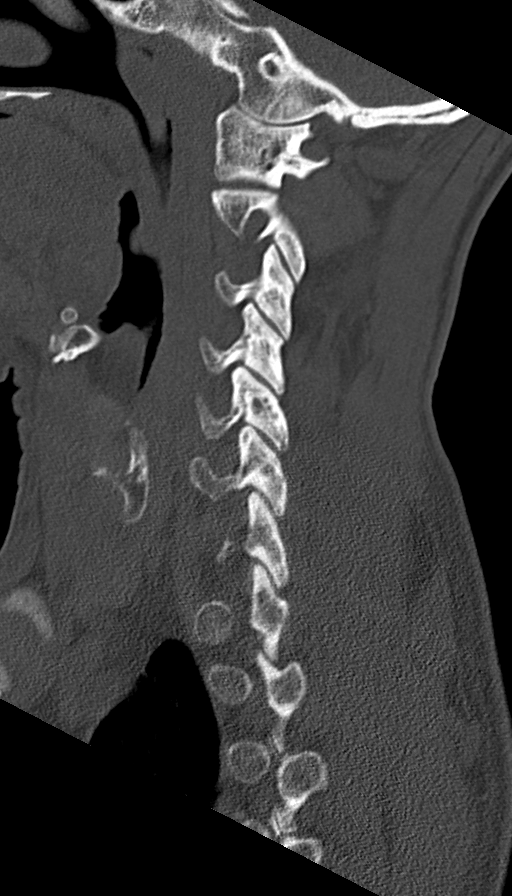
[im 28/67  bone]
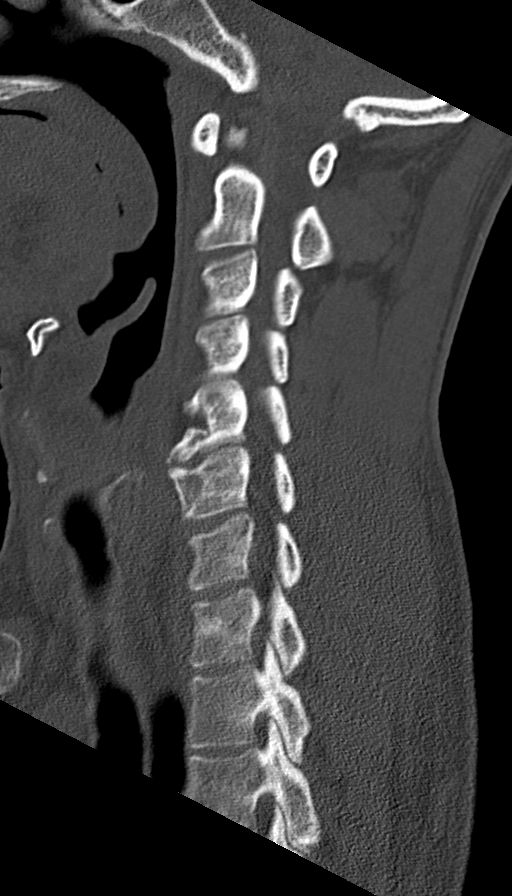
[im 34/67  soft-tissue]
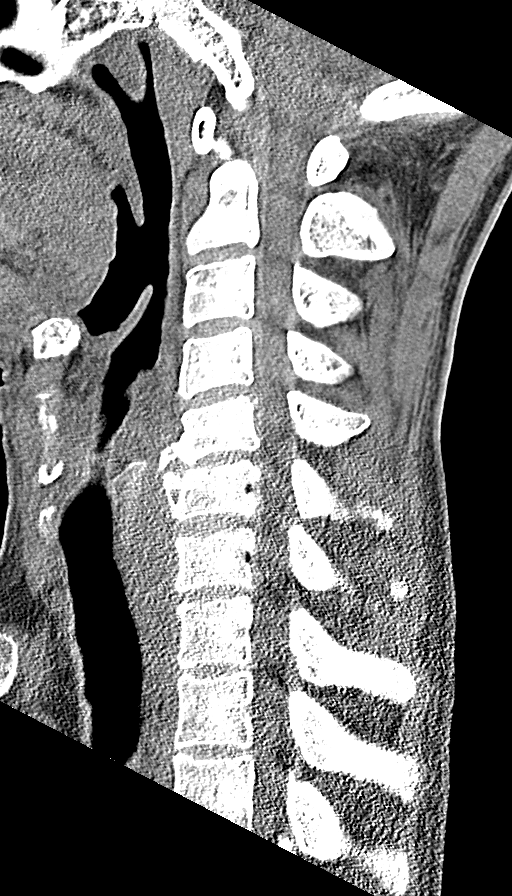
[im 34/67  bone]
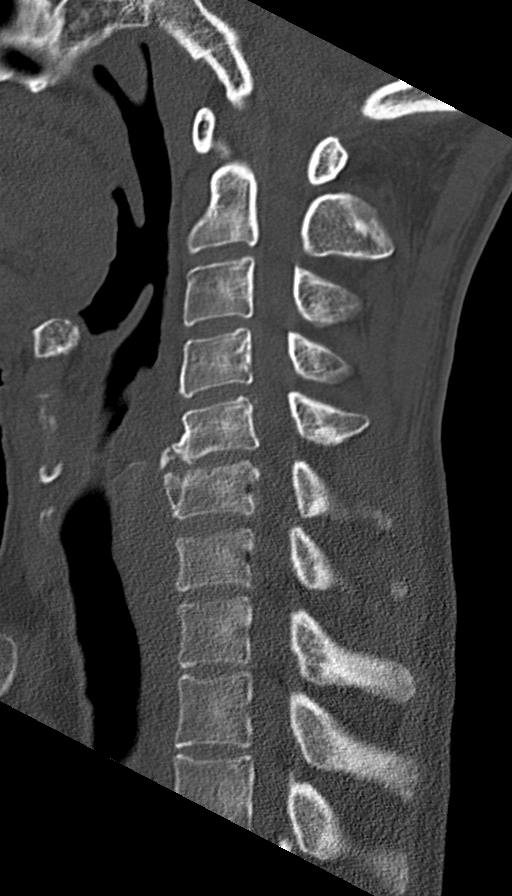
[im 39/67  bone]
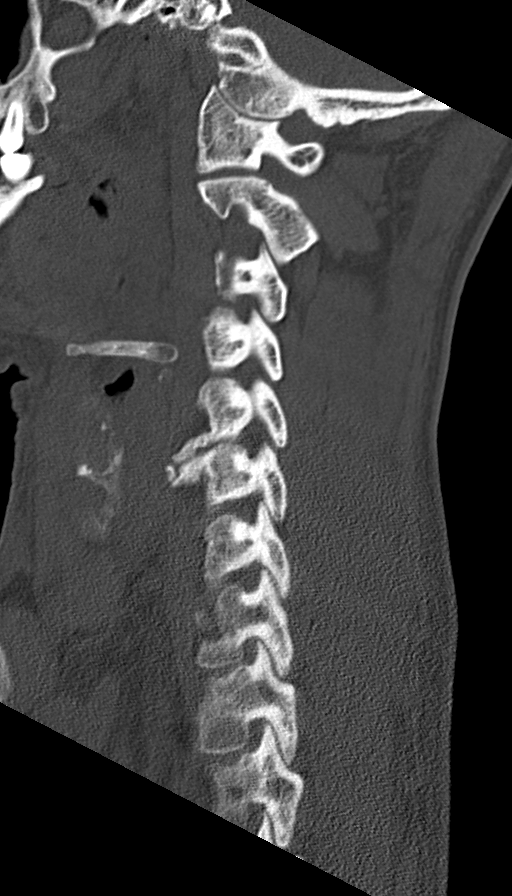
[im 45/67  bone]
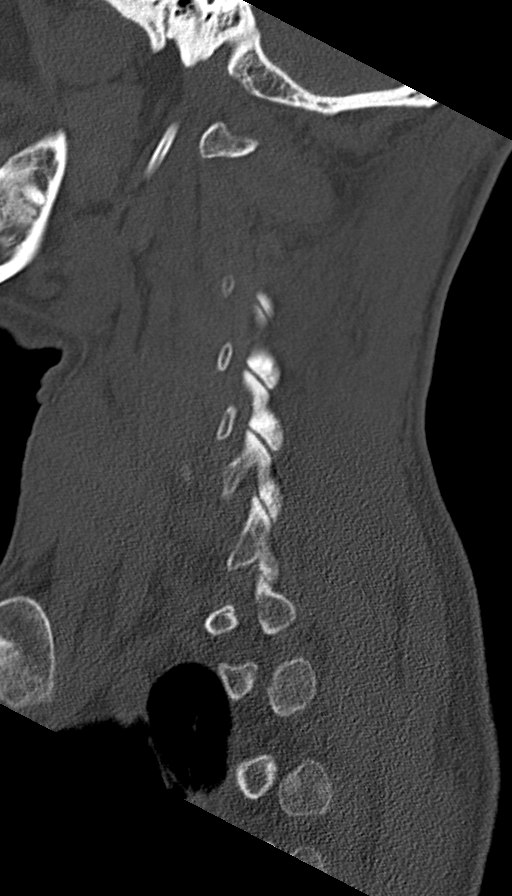

[12 of 33 positions shown; findings below may reference images not displayed]

FINDINGS: Alignment: No subluxation.

Skull base and vertebrae: No acute fracture. No primary bone lesion
or focal pathologic process.

Soft tissues and spinal canal: No prevertebral fluid or swelling. No
visible canal hematoma.

Disc levels: Degenerative disc disease at C5-6 with disc space
narrowing and spurring, stable since prior study.

Upper chest: Partially imaged cystic/cavitary area in the left upper
lobe measuring up to 5 cm.

Other: None
IMPRESSION: No acute bony abnormality.

Cavitary area in the left upper lobe. See further discussion on CTA
chest performed today.

## 2021-03-31 IMAGING — CT CT ANGIO CHEST
2 of 6 series · 18 of 46 positions shown · IV contrast (APPLIED)
Comparison: Chest x-ray [DATE].  Chest CT [DATE].

CLINICAL DATA: Multiple falls.  Rib pain.  Generalized weakness.

EXAM:
CT ANGIOGRAPHY CHEST WITH CONTRAST
TECHNIQUE: Multidetector CT imaging of the chest was performed using the
standard protocol during bolus administration of intravenous
contrast. Multiplanar CT image reconstructions and MIPs were
obtained to evaluate the vascular anatomy.
CONTRAST:  100mL OMNIPAQUE IOHEXOL 350 MG/ML SOLN

[Series 5: thins · axial · 0.66mm/px · z∈[-539,-264]mm · 15 of 376 slices shown]
[im 16/376  lung]
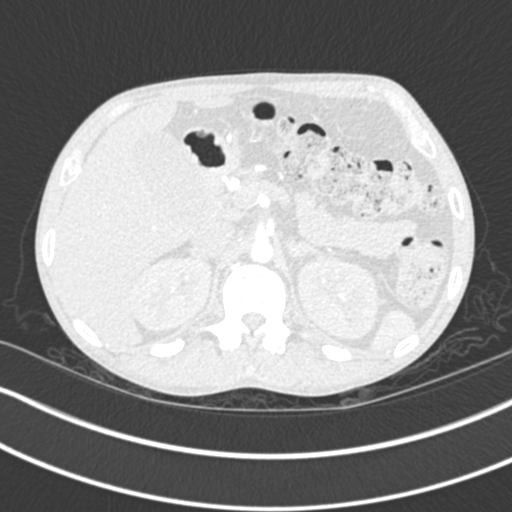
[im 47/376  soft-tissue]
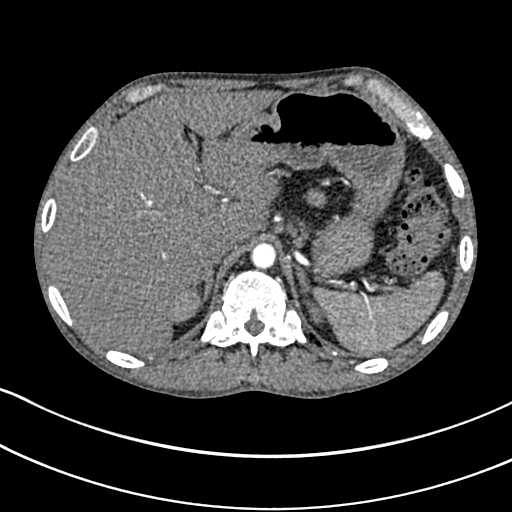
[im 63/376  lung]
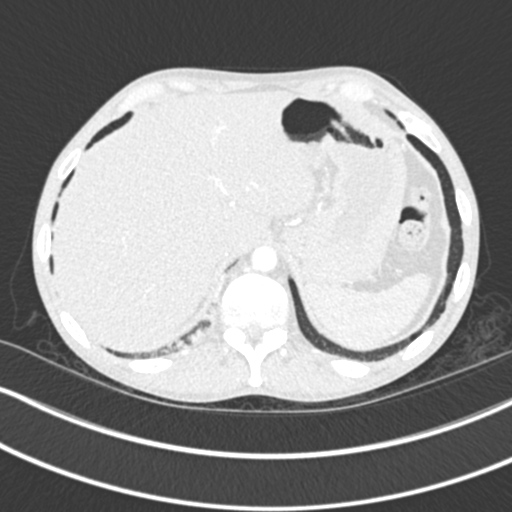
[im 94/376  soft-tissue]
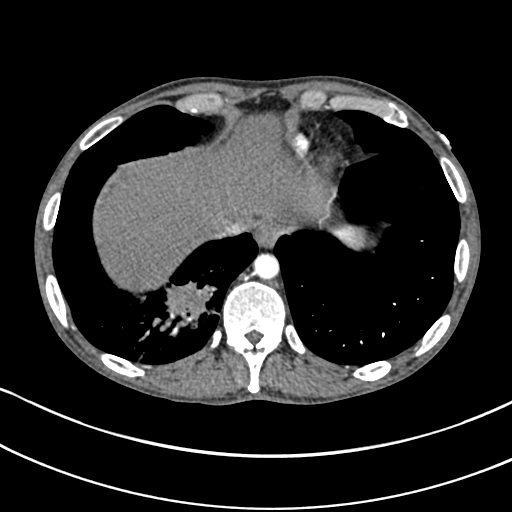
[im 110/376  lung]
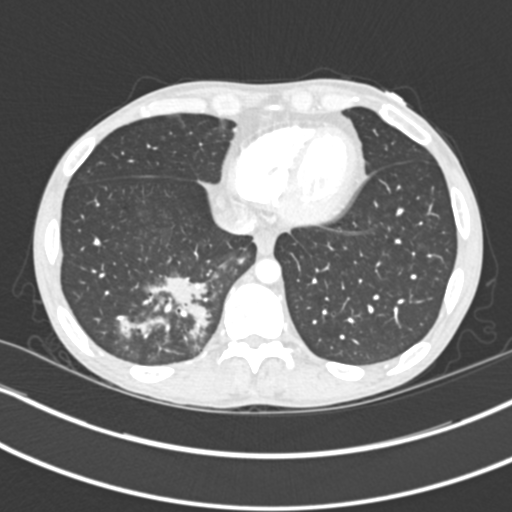
[im 141/376  soft-tissue]
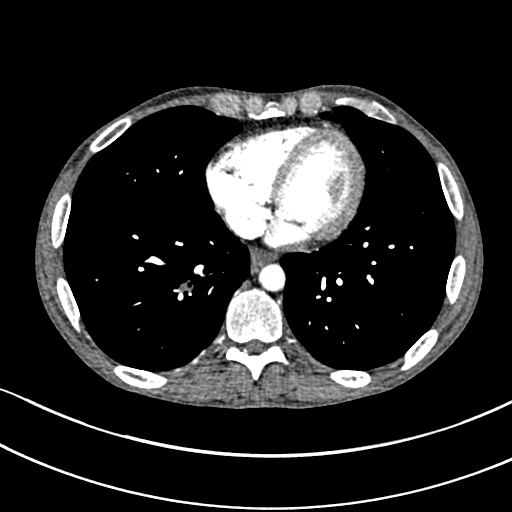
[im 157/376  lung]
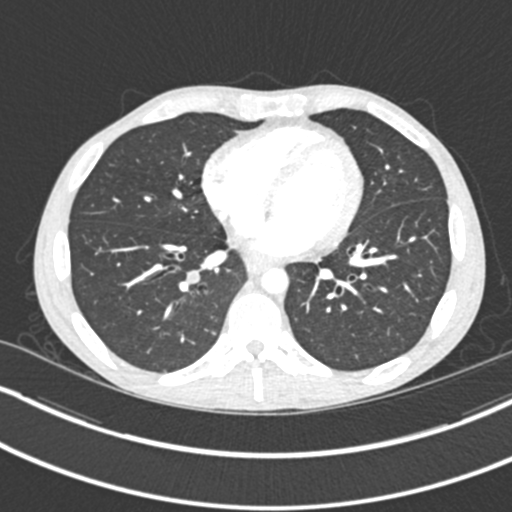
[im 188/376  soft-tissue]
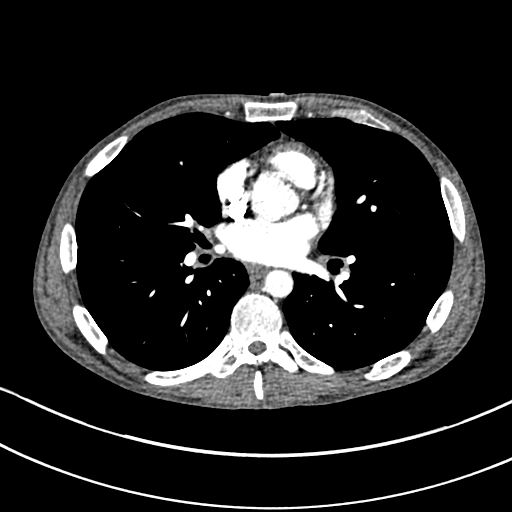
[im 219/376  lung]
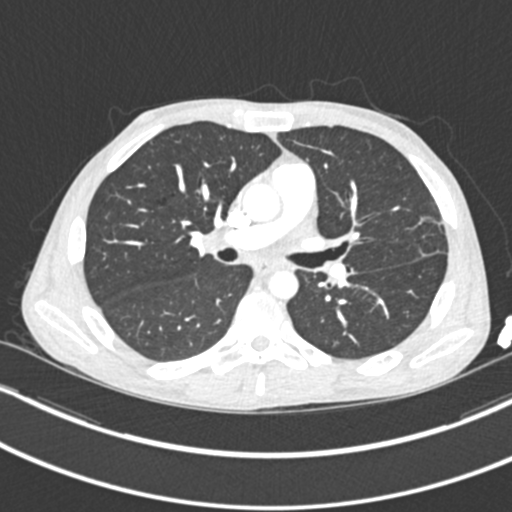
[im 235/376  soft-tissue]
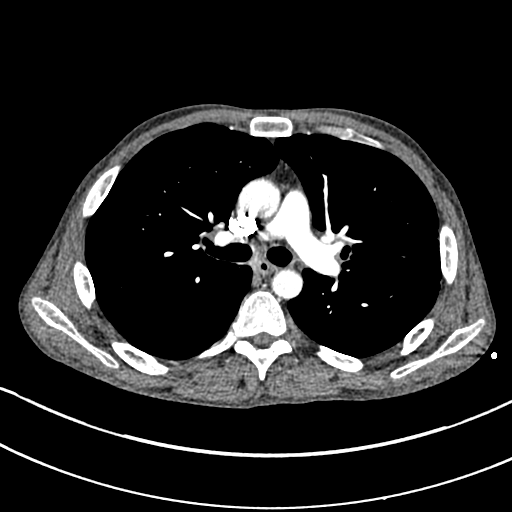
[im 266/376  lung]
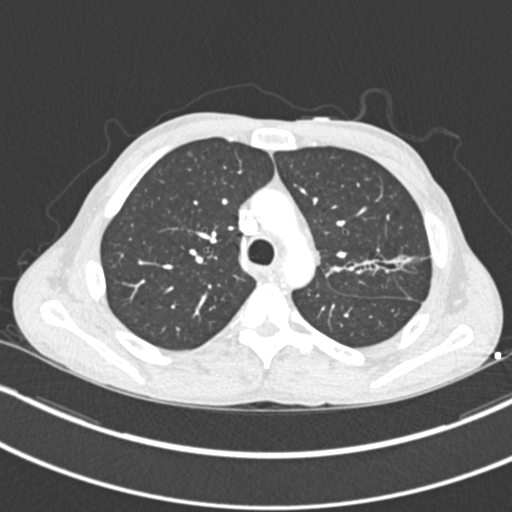
[im 282/376  soft-tissue]
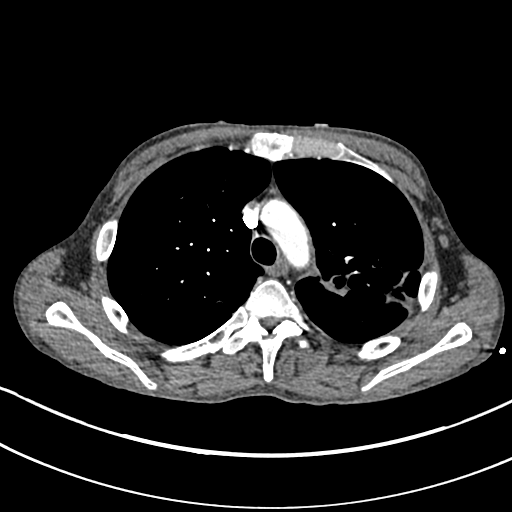
[im 313/376  lung]
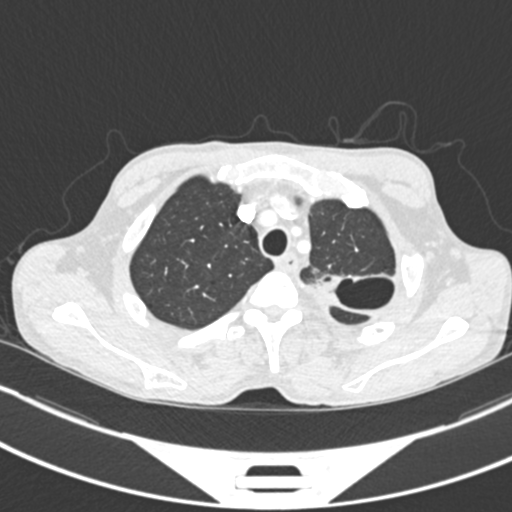
[im 329/376  soft-tissue]
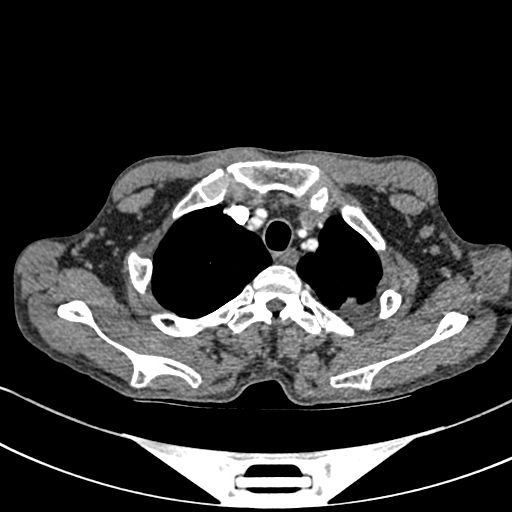
[im 360/376  lung]
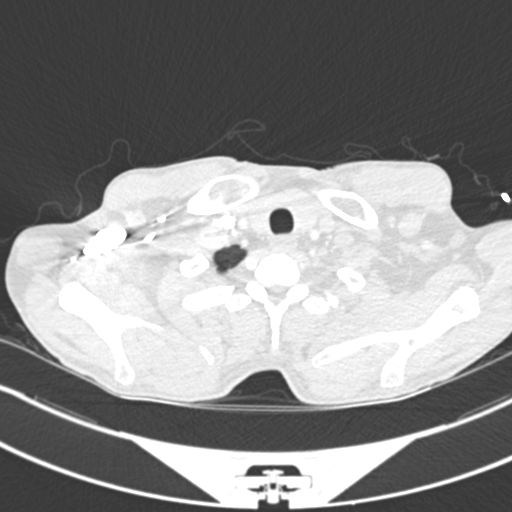

[Series 7: coronal mpr · coronal · 0.59mm/px · 3 of 81 slices shown]
[im 21/81  soft-tissue]
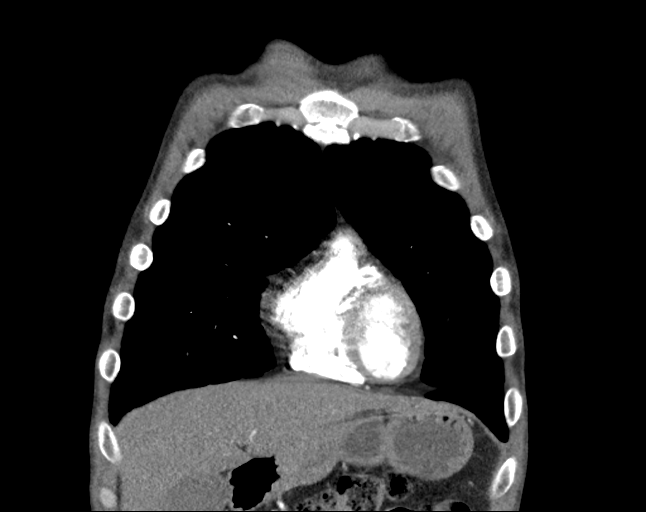
[im 41/81  soft-tissue]
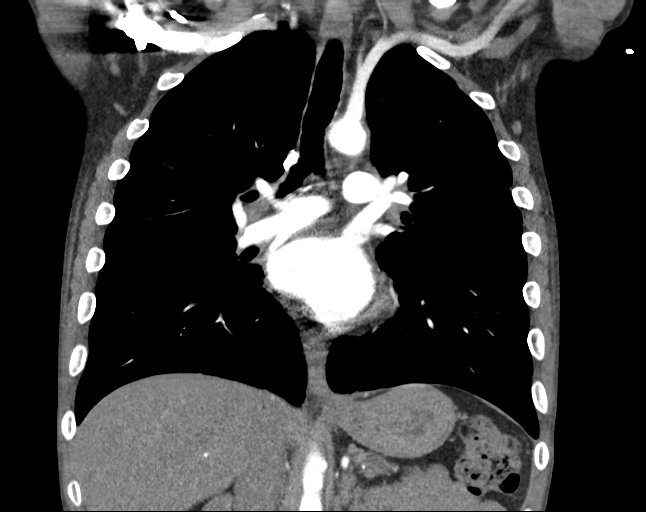
[im 61/81  soft-tissue]
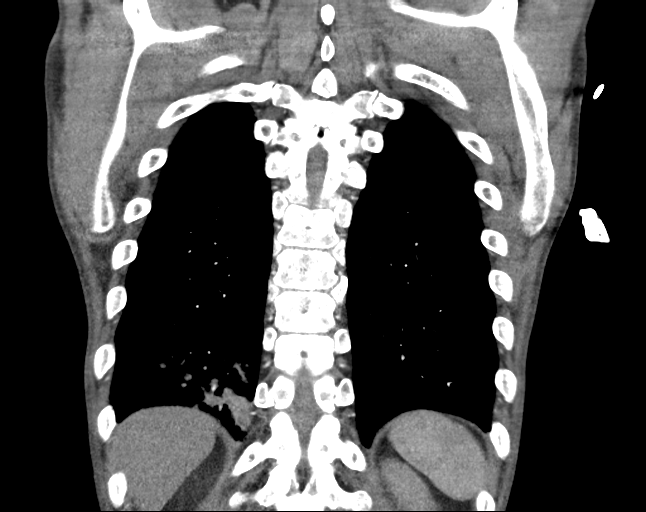

[18 of 46 positions shown; findings below may reference images not displayed]

FINDINGS: Cardiovascular: Satisfactory opacification of the pulmonary arteries
to the segmental level. No evidence of pulmonary embolism. Normal
heart size. No pericardial effusion.

Mediastinum/Nodes: Shotty nodes in the AP window and prevascular
space are similar in the interval, likely reactive. Mildly prominent
node in the right hilum is likely reactive. Remainder of the nodes
are normal. No pleural or pericardial effusions.

Lungs/Pleura: Central airways are normal. There is opacification of
right lower lobe bronchi. No pneumothorax. The suspected cavitary
pneumonia in the left upper lobe on the [DATE] CT scan is
significantly improved. The air-filled cavitary portion remains but
is smaller. The surrounding opacity and thickened wall are much
improved. New infiltrate in the right base. There is opacification
of right lower lobe airways. No other changes within the lungs.

Upper Abdomen: Evidence of chronic pancreatitis. No acute
abnormalities on limited views of the upper abdomen.

Musculoskeletal: No chest wall abnormality. No acute or significant
osseous findings.

Review of the MIP images confirms the above findings.
IMPRESSION: 1. The cavitary process in the left upper lobe is significantly
improved most consistent with resolving left upper lobe necrotizing
cavitary pneumonia. Recommend a follow-up CT scan in 2 or 3 months
to ensure continued resolution.
2. New infiltrate in the right lung base. Given opacification of
right lower lobe bronchi, aspiration should be considered. Pneumonia
could have a similar appearance. Recommend attention on follow-up.
3. Reactive nodes in the right hilum and mediastinum as above.
Recommend attention on follow-up.

## 2021-03-31 IMAGING — CR DG CHEST 2V
1 series · 2 of 2 positions shown · non-contrast
Comparison: [DATE]

CLINICAL DATA: Recent pneumonia.  Multiple falls.

EXAM:
CHEST - 2 VIEW

[Series 1: dg chest 2 view · 0.14mm/px · 2 of 2 slices shown]
[im 1/2]
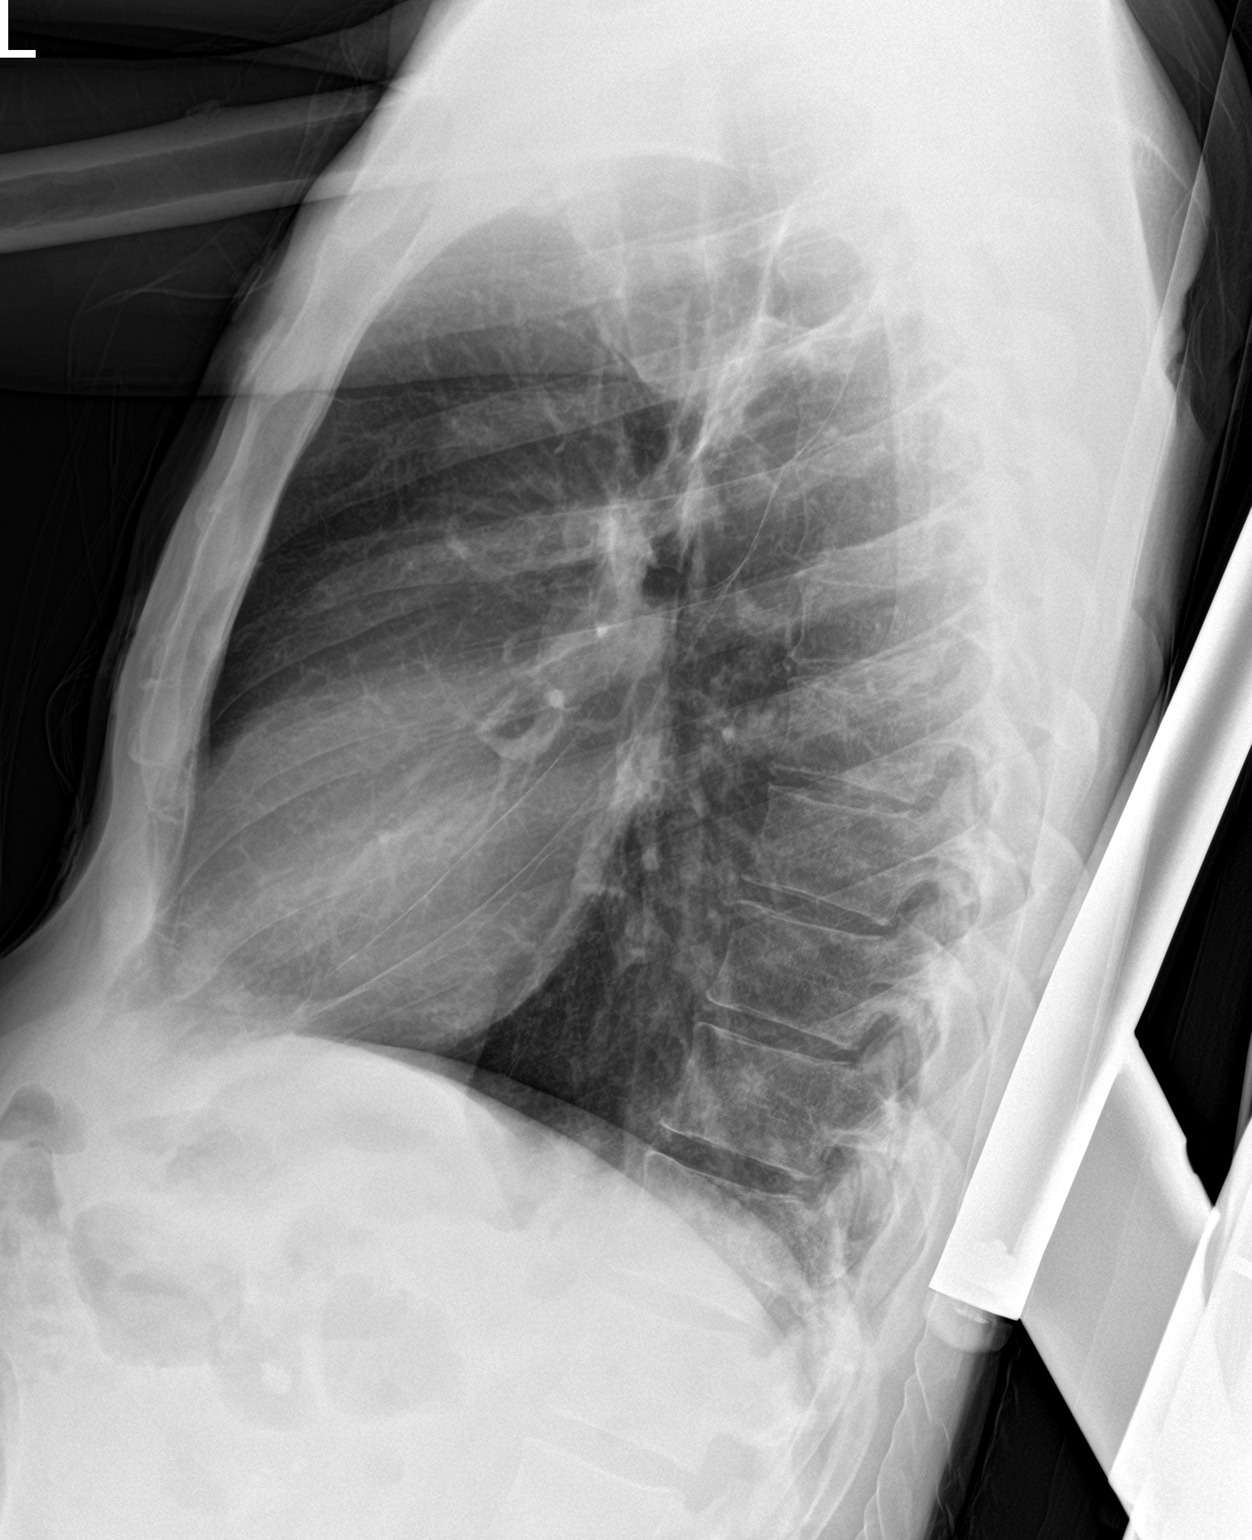
[im 2/2]
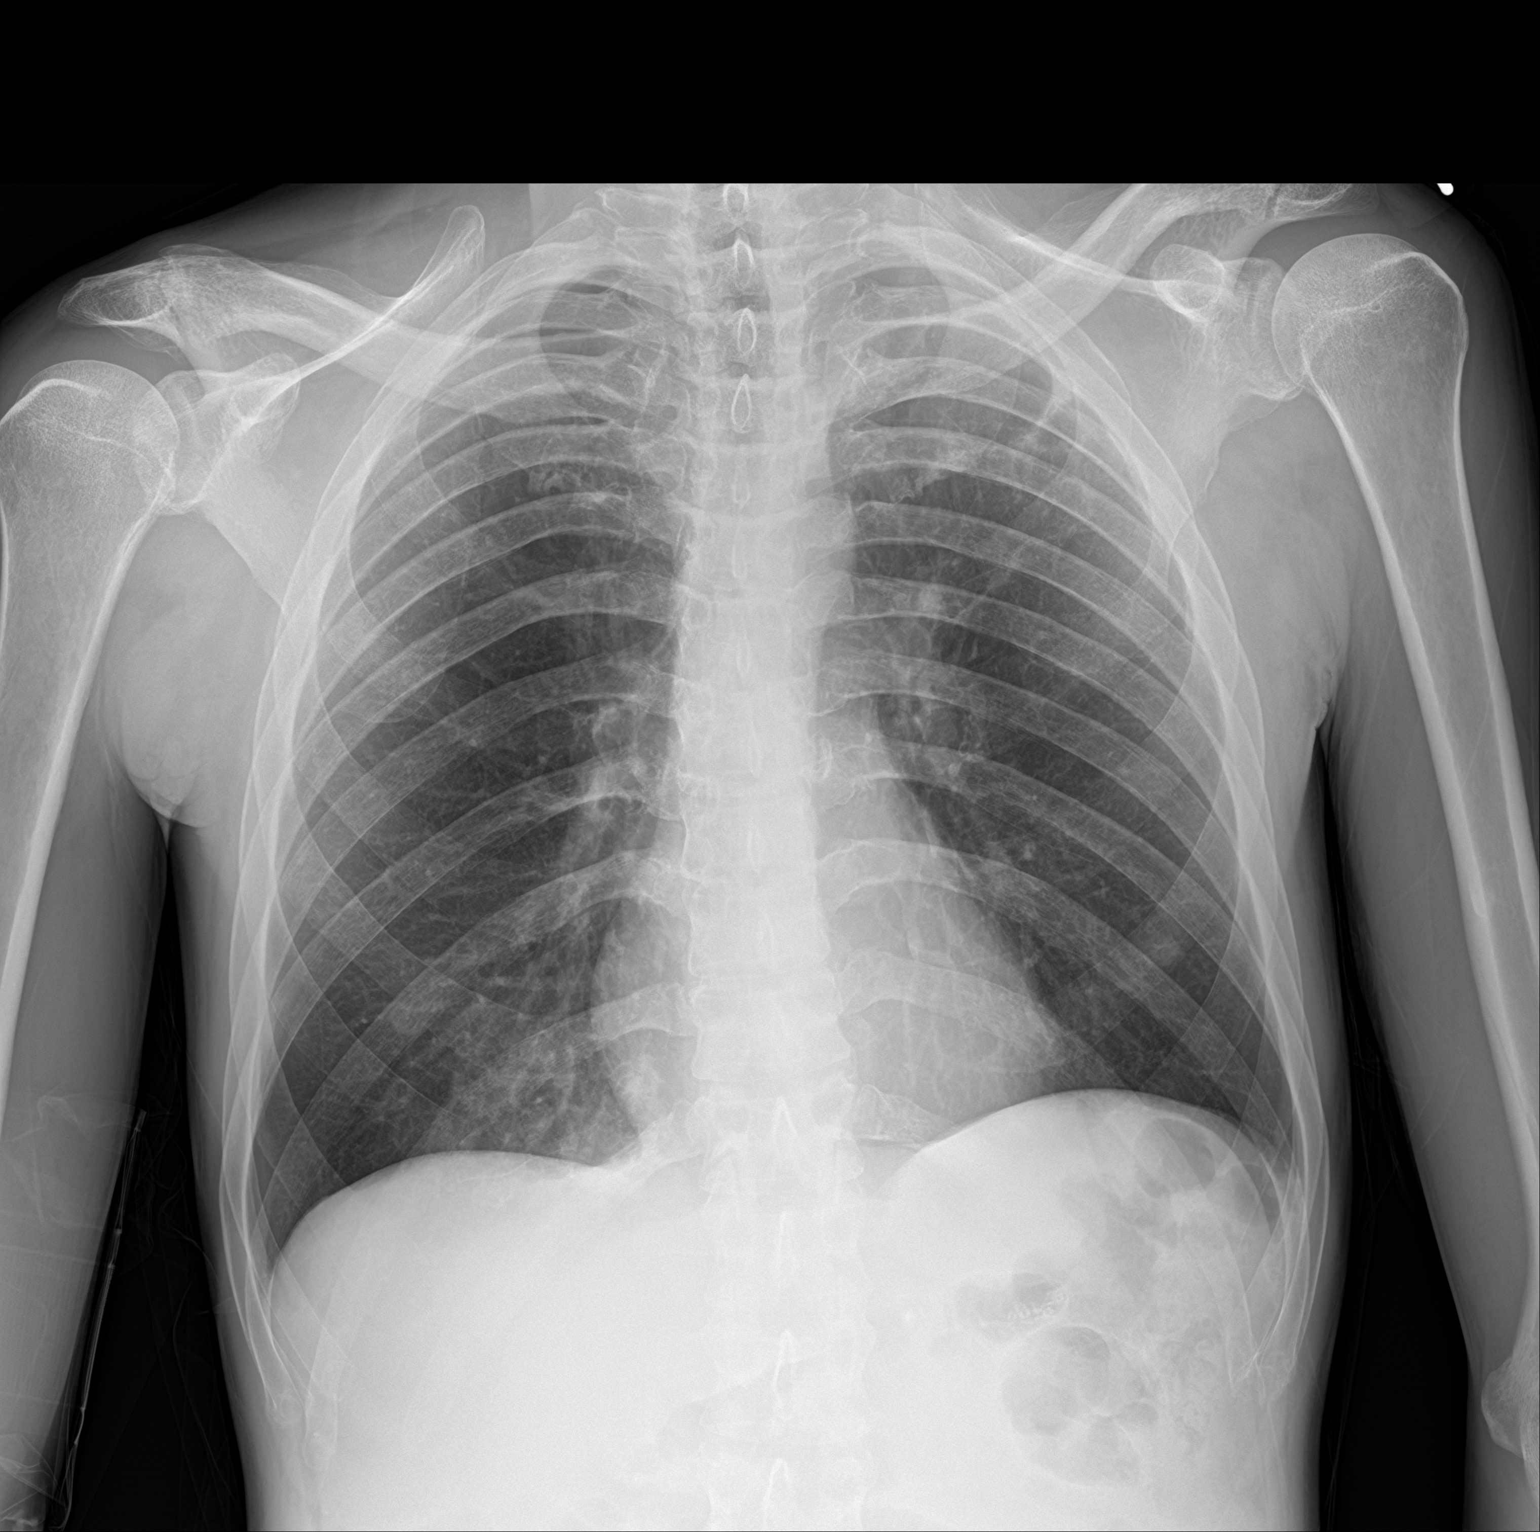

[2 of 2 positions shown; findings below may reference images not displayed]

FINDINGS: The previously identified left upper lobe cavitary pneumonia seen on
CT imaging from [DATE] has significantly improved. Only mild
opacity remains. Cavitation probably remains but appears smaller.
Mild opacity in the medial right lung base identified. The lungs are
otherwise clear. The heart, hila, mediastinum, and pleura are
normal.
IMPRESSION: 1. The cavitary pneumonia in the left upper lobe is significantly
improved but not completely resolved. Recommend short-term follow-up
imaging to ensure complete resolution.
2. Mild opacity in the medial right lung base is concerning for a
subtle infiltrate. Vascular crowding is possible as well. Recommend
attention on follow-up.
3. No other abnormalities.

## 2021-03-31 MED ORDER — MAGNESIUM SULFATE 4 GM/100ML IV SOLN
4.0000 g | Freq: Once | INTRAVENOUS | Status: AC
  Administered 2021-03-31: 4 g via INTRAVENOUS
  Filled 2021-03-31: qty 100

## 2021-03-31 MED ORDER — LORAZEPAM 2 MG/ML IJ SOLN: 0.0000 mg | Freq: Four times a day (QID) | INTRAMUSCULAR | Status: DC

## 2021-03-31 MED ORDER — LORAZEPAM 2 MG/ML IJ SOLN: 0.0000 mg | Freq: Two times a day (BID) | INTRAMUSCULAR | Status: DC

## 2021-03-31 MED ORDER — IOHEXOL 350 MG/ML SOLN
100.0000 mL | Freq: Once | INTRAVENOUS | Status: AC | PRN
  Administered 2021-03-31: 100 mL via INTRAVENOUS

## 2021-03-31 MED ORDER — LORAZEPAM 2 MG PO TABS: 0.0000 mg | ORAL_TABLET | Freq: Four times a day (QID) | ORAL | Status: DC

## 2021-03-31 MED ORDER — THIAMINE HCL 100 MG/ML IJ SOLN
100.0000 mg | Freq: Every day | INTRAMUSCULAR | Status: DC
  Filled 2021-03-31: qty 2

## 2021-03-31 MED ORDER — THIAMINE HCL 100 MG PO TABS
100.0000 mg | ORAL_TABLET | Freq: Every day | ORAL | Status: DC
  Administered 2021-03-31: 100 mg via ORAL
  Filled 2021-03-31: qty 1

## 2021-03-31 MED ORDER — LORAZEPAM 2 MG PO TABS: 0.0000 mg | ORAL_TABLET | Freq: Two times a day (BID) | ORAL | Status: DC

## 2021-03-31 NOTE — ED Triage Notes (Signed)
Pt in via EMS from home with c/o weakness and multiple falls over the last few days. No obvious injuries noted. Pt also reports has neuropathy and cannot feel a lot of things, 106/76

## 2021-03-31 NOTE — ED Provider Notes (Signed)
Emergency Medicine Provider Triage Evaluation Note  Lance Green , a 45 y.o. male  was evaluated in triage.  Patient is a generally poor historian but reports multiple falls, rib pain due to fall and multiple hits to the head as well as generalized weakness. He denies any fevers, shortness of breath or recent seizures  Review of Systems  Positive: Weakness, rib pain, falls Negative: Abdominal pain, nausea, vomiting diarrhea  Physical Exam  BP 115/73 (BP Location: Right Arm)   Pulse (!) 102   Temp 98.4 F (36.9 C) (Oral)   Resp 16   SpO2 94%  Gen:   Awake, no distress  Resp:  Normal effort  MSK:   Moves extremities without difficulty  Other:    Medical Decision Making  Medically screening exam initiated at 4:18 PM.  Appropriate orders placed.  [name] was informed that the remainder of the evaluation will be completed by another provider, this initial triage assessment does not replace that evaluation, and the importance of remaining in the ED until their evaluation is complete.  Review of the patient's chart reveals recent admission for cavitary pneumonia. Patient unable to tell me if he finished his outpatient medications for this. Patient has hx of seizures and pulls a bottle of Keppra out of his pocket that states he has been taking, but states he doesn't know why he's recently fall. Will initiate early labs including chest pain protocol and CT of head given head trauma with multiple falls    Lucy Chris, [place] 03/31/21 1621    Gilles Chiquito, MD 03/31/21 2232

## 2021-03-31 NOTE — ED Triage Notes (Signed)
Pt arrives to ER c/o of possible rib fracture on R side from fall. States multiple falls, states hit head. Denies LOC. Denies blood thinners. A&O, speech clear. Also c/o of neuropathy to hands and legs x years. Pt states recently seen for "pneumonia on my heart."

## 2021-03-31 NOTE — ED Provider Notes (Signed)
[facility] Emergency Department Provider Note  ____________________________________________   Event Date/Time   First MD Initiated Contact with Patient 03/31/21 1824     (approximate)  I have reviewed the triage vital signs and the nursing notes.   HISTORY  Chief Complaint Rib Injury and Numbness   HPI Lance Green is a 45 y.o. male with past medical history of alcohol abuse, seizure disorder on Keppra, HTN, cryptitis, peptic ulcer disease, peripheral neuropathy and recent hospitalization 5/18-5/20 for sepsis secondary to cavitary pneumonia in the left who presents for assessment of 2 falls yesterday and worsening weakness in his lower extremities.  Patient states both times yesterday his legs "gave out from under me" causing him to fall.  He is not sure if he lost consciousness or not but does think he hit his head and right side of his chest.  He has had some right-sided chest pain since then.  He states the pain in his left chest that he has had last couple weeks that been getting better.  He thinks he completed antibiotics he was post after leaving hospital but is not sure.  He has been taking his Keppra as directed and does not think he had any recent seizures.  States he is still drinking regularly but only had 1 beer yesterday and none today.  Denies any other illicit drug use.  Endorses mild headache but denies any cough, shortness of breath, fevers, abdominal pain, back pain, incontinence, burning with urination, diarrhea, constipation, weakness or numbness in his upper extremities.  States he usually has numbness in his lower extremities but the weakness is new since yesterday.  He thinks it is fairly symmetric.  No prior similar episodes.  No other acute concerns at this time.         Past Medical History:  Diagnosis Date   Alcohol abuse    HTN (hypertension)    Neuropathy    Pancreatitis    Peptic ulcer disease     Patient Active Problem List    Diagnosis Date Noted   Cavitary pneumonia 02/20/2021   Sepsis (HCC) 02/20/2021   Normocytic anemia 02/20/2021   HTN (hypertension)    Seizure (HCC) 11/21/2019   Alcohol abuse 11/21/2019   Hypomagnesemia 11/21/2019   Abscess of left middle finger 11/10/2018   Acute appendicitis with localized peritonitis 07/06/2018    Past Surgical History:  Procedure Laterality Date   APPENDECTOMY     INCISION AND DRAINAGE Left 11/11/2018   Procedure: INCISION AND DRAINAGE;  Surgeon: Donato Heinz, MD;  Location: [facility] ORS;  Service: Orthopedics;  Laterality: Left;   LAPAROSCOPIC APPENDECTOMY N/A 07/06/2018   Procedure: APPENDECTOMY LAPAROSCOPIC;  Surgeon: Carolan Shiver, MD;  Location: [facility] ORS;  Service: General;  Laterality: N/A;    Prior to Admission medications   Medication Sig Start Date End Date Taking? Authorizing Provider  amLODipine (NORVASC) 2.5 MG tablet Take 1 tablet (2.5 mg total) by mouth daily. Patient not taking: Reported on 02/20/2021 11/29/19   Lynn Ito, MD  folic acid (FOLVITE) 1 MG tablet Take 1 tablet (1 mg total) by mouth daily. Patient not taking: Reported on 02/20/2021 11/29/19   Lynn Ito, MD  levETIRAcetam (KEPPRA) 250 MG tablet Take 1 tablet (250 mg total) by mouth 2 (two) times daily. Patient not taking: Reported on 02/20/2021 11/28/19   Lynn Ito, MD  levETIRAcetam (KEPPRA) 250 MG tablet Take 1 tablet (250 mg total) by mouth 2 (two) times daily. 09/04/20 11/03/20  Donna Bernard  K, MD  Multiple Vitamin (MULTIVITAMIN WITH MINERALS) TABS tablet Take 1 tablet by mouth daily. Patient not taking: Reported on 02/20/2021 11/29/19   Lynn Ito, MD  thiamine 100 MG tablet Take 1 tablet (100 mg total) by mouth daily. Patient not taking: Reported on 02/20/2021 11/28/19   Lynn Ito, MD    Allergies Patient has no known allergies.  Family History  Problem Relation Age of Onset   Cataracts Mother     Social History Social History   Tobacco Use   Smoking  status: Every Day    Packs/day: 0.50    Pack years: 0.00    Types: Cigarettes   Smokeless tobacco: Never  Vaping Use   Vaping Use: Never used  Substance Use Topics   Alcohol use: Yes    Comment: daily 40 oz, reports 4 40oz/day   Drug use: No    Review of Systems  Review of Systems  Constitutional:  Positive for malaise/fatigue. Negative for chills and fever.  HENT:  Negative for sore throat.   Eyes:  Negative for pain.  Respiratory:  Negative for cough and stridor.   Cardiovascular:  Positive for chest pain (R chest).  Gastrointestinal:  Negative for vomiting.  Genitourinary:  Negative for dysuria.  Musculoskeletal:  Positive for falls.  Skin:  Negative for rash.  Neurological:  Positive for dizziness, sensory change (b/l legs) and weakness (b/l legs). Negative for seizures, loss of consciousness and headaches.  Psychiatric/Behavioral:  Negative for suicidal ideas.   All other systems reviewed and are negative.    ____________________________________________   PHYSICAL EXAM:  VITAL SIGNS: ED Triage Vitals [03/31/21 1616]  Enc Vitals Group     BP 115/73     Pulse Rate (!) 102     Resp 16     Temp 98.4 F (36.9 C)     Temp Source Oral     SpO2 94 %     Weight      Height      Head Circumference      Peak Flow      Pain Score      Pain Loc      Pain Edu?      Excl. in GC?    Vitals:   03/31/21 1830 03/31/21 2030  BP: 122/78 (!) 132/97  Pulse: 87 84  Resp: 16 16  Temp:    SpO2: 99% 98%   Physical Exam Vitals and nursing note reviewed.  Constitutional:      Appearance: He is well-developed.  HENT:     Head: Normocephalic and atraumatic.     Right Ear: External ear normal.     Left Ear: External ear normal.     Nose: Nose normal.     Mouth/Throat:     Mouth: Mucous membranes are moist.  Eyes:     Conjunctiva/sclera: Conjunctivae normal.  Cardiovascular:     Rate and Rhythm: Normal rate and regular rhythm.     Heart sounds: No murmur  heard. Pulmonary:     Effort: Pulmonary effort is normal. No respiratory distress.     Breath sounds: Normal breath sounds.  Abdominal:     Palpations: Abdomen is soft.     Tenderness: There is no abdominal tenderness.  Musculoskeletal:     Cervical back: Neck supple.  Skin:    General: Skin is warm and dry.     Capillary Refill: Capillary refill takes less than 2 seconds.  Neurological:     Mental Status: He is  alert and oriented to person, place, and time.  Psychiatric:        Mood and Affect: Mood normal.    Patient is oriented.  PERRLA.  EOMI.  No pronator drift.  No finger dysmetria.  Patient has face metric strength in his upper extremities.  He is unable to lift bilateral lower extremities off the bed denies he can feel any pinprick or sensation to light touch from this examiner.  Mild tenderness over the right lower chest.  No obvious trauma to the face scalp head or neck.  No tenderness or step-offs or deformities over the C/T/L-spine.  2+ radial and DP pulses.   ____________________________________________   LABS (all labs ordered are listed, but only abnormal results are displayed)  Labs Reviewed  BASIC METABOLIC PANEL - Abnormal; Notable for the following components:      Result Value   BUN 5 (*)    Calcium 7.8 (*)    All other components within normal limits  CBC - Abnormal; Notable for the following components:   RBC 2.86 (*)    Hemoglobin 9.4 (*)    HCT 27.3 (*)    All other components within normal limits  URINALYSIS, COMPLETE (UACMP) WITH MICROSCOPIC - Abnormal; Notable for the following components:   Color, Urine STRAW (*)    APPearance CLEAR (*)    Specific Gravity, Urine 1.004 (*)    All other components within normal limits  ETHANOL - Abnormal; Notable for the following components:   Alcohol, Ethyl (B) 340 (*)    All other components within normal limits  MAGNESIUM - Abnormal; Notable for the following components:   Magnesium 1.5 (*)    All other  components within normal limits  LIPASE, BLOOD  PROCALCITONIN  CBG MONITORING, ED  TROPONIN I (HIGH SENSITIVITY)  TROPONIN I (HIGH SENSITIVITY)   ____________________________________________  EKG  Sinus rhythm with ventricular rate of 89, normal axis, unremarkable intervals without clearance of acute ischemia or significant Arrhythmia. ____________________________________________  RADIOLOGY  ED MD interpretation: CT head has no evidence of intracranial hemorrhage or other clear acute intracranial process.  CT C-spine shows no acute cervical spine injury.  Chest x-ray obtained has evidence of persistent cavitary pneumonia in the left although improved from prior with some mild opacification at the right base concerning for subtle infiltrate.  No evidence of pneumothorax, rib fracture or other clear acute intrathoracic process.,  The pelvis shows no acute pelvic fracture.  CTA chest without evidence of PE, pericardial effusion, large pleural effusion with improvement in left-sided cavitary pneumonia compared to last imaging and no infiltration in the right base consistent with possible aspiration.  There is also some reactive lymphadenopathy in the right hilum.  No other clear acute thoracic process. Official radiology report(s): DG Chest 2 View  Result Date: 03/31/2021 CLINICAL DATA:  Recent pneumonia.  Multiple falls. EXAM: CHEST - 2 VIEW COMPARISON:  Feb 20, 2021 FINDINGS: The previously identified left upper lobe cavitary pneumonia seen on CT imaging from Feb 20, 2021 has significantly improved. Only mild opacity remains. Cavitation probably remains but appears smaller. Mild opacity in the medial right lung base identified. The lungs are otherwise clear. The heart, hila, mediastinum, and pleura are normal. IMPRESSION: 1. The cavitary pneumonia in the left upper lobe is significantly improved but not completely resolved. Recommend short-term follow-up imaging to ensure complete resolution. 2.  Mild opacity in the medial right lung base is concerning for a subtle infiltrate. Vascular crowding is possible as well. Recommend attention  on follow-up. 3. No other abnormalities. Electronically Signed   By: Gerome Sam III M.D   On: 03/31/2021 17:17   CT HEAD WO CONTRAST  Result Date: 03/31/2021 CLINICAL DATA:  Head trauma. Weakness and multiple falls over the last few days in a 45 year old male. EXAM: CT HEAD WITHOUT CONTRAST TECHNIQUE: Contiguous axial images were obtained from the base of the skull through the vertex without intravenous contrast. COMPARISON:  November 21, 2019.  November 23, 2019. FINDINGS: Brain: No evidence of acute infarction, hemorrhage, hydrocephalus, extra-axial collection or mass lesion/mass effect. Signs of mild atrophy as before. Vascular: No hyperdense vessel or unexpected calcification. Skull: Normal. Negative for fracture or focal lesion. Sinuses/Orbits: Scattered ethmoid opacification. Chronic appearing mucosal thickening of the LEFT frontal sinus. The visualized orbits are unremarkable. Other: None IMPRESSION: 1. No acute intracranial abnormality.  Mild sinusitis. Electronically Signed   By: Donzetta Kohut M.D.   On: 03/31/2021 16:50   CT Angio Chest PE W and/or Wo Contrast  Result Date: 03/31/2021 CLINICAL DATA:  Multiple falls.  Rib pain.  Generalized weakness. EXAM: CT ANGIOGRAPHY CHEST WITH CONTRAST TECHNIQUE: Multidetector CT imaging of the chest was performed using the standard protocol during bolus administration of intravenous contrast. Multiplanar CT image reconstructions and MIPs were obtained to evaluate the vascular anatomy. CONTRAST:  OMNIPAQUE IOHEXOL 350 MG/ML SOLN COMPARISON:  Chest x-ray March 31, 2021.  Chest CT Feb 20, 2021. FINDINGS: Cardiovascular: Satisfactory opacification of the pulmonary arteries to the segmental level. No evidence of pulmonary embolism. Normal heart size. No pericardial effusion. Mediastinum/Nodes: Shotty nodes in the AP  window and prevascular space are similar in the interval, likely reactive. Mildly prominent node in the right hilum is likely reactive. Remainder of the nodes are normal. No pleural or pericardial effusions. Lungs/Pleura: Central airways are normal. There is opacification of right lower lobe bronchi. No pneumothorax. The suspected cavitary pneumonia in the left upper lobe on the Feb 20, 2021 CT scan is significantly improved. The air-filled cavitary portion remains but is smaller. The surrounding opacity and thickened wall are much improved. New infiltrate in the right base. There is opacification of right lower lobe airways. No other changes within the lungs. Upper Abdomen: Evidence of chronic pancreatitis. No acute abnormalities on limited views of the upper abdomen. Musculoskeletal: No chest wall abnormality. No acute or significant osseous findings. Review of the MIP images confirms the above findings. IMPRESSION: 1. The cavitary process in the left upper lobe is significantly improved most consistent with resolving left upper lobe necrotizing cavitary pneumonia. Recommend a follow-up CT scan in 2 or 3 months to ensure continued resolution. 2. New infiltrate in the right lung base. Given opacification of right lower lobe bronchi, aspiration should be considered. Pneumonia could have a similar appearance. Recommend attention on follow-up. 3. Reactive nodes in the right hilum and mediastinum as above. Recommend attention on follow-up. Electronically Signed   By: Gerome Sam III M.D   On: 03/31/2021 19:40   CT Cervical Spine Wo Contrast  Result Date: 03/31/2021 CLINICAL DATA:  Fall, neck trauma EXAM: CT CERVICAL SPINE WITHOUT CONTRAST TECHNIQUE: Multidetector CT imaging of the cervical spine was performed without intravenous contrast. Multiplanar CT image reconstructions were also generated. COMPARISON:  11/21/2019 FINDINGS: Alignment: No subluxation. Skull base and vertebrae: No acute fracture. No primary  bone lesion or focal pathologic process. Soft tissues and spinal canal: No prevertebral fluid or swelling. No visible canal hematoma. Disc levels: Degenerative disc disease at C5-6 with disc space narrowing  and spurring, stable since prior study. Upper chest: Partially imaged cystic/cavitary area in the left upper lobe measuring up to 5 cm. Other: None IMPRESSION: No acute bony abnormality. Cavitary area in the left upper lobe. See further discussion on CTA chest performed today. Electronically Signed   By: Charlett Nose M.D.   On: 03/31/2021 19:29   DG Pelvis Portable  Result Date: 03/31/2021 CLINICAL DATA:  Fall, weakness EXAM: PORTABLE PELVIS 1-2 VIEWS COMPARISON:  None. FINDINGS: Early spurring in the hip joints bilaterally. No acute bony abnormality. Specifically, no fracture, subluxation, or dislocation. IMPRESSION: No acute bony abnormality. Electronically Signed   By: Charlett Nose M.D.   On: 03/31/2021 19:14    CTA 1. The cavitary process in the left upper lobe is significantly improved most consistent with resolving left upper lobe necrotizing cavitary pneumonia. Recommend a follow-up CT scan in 2 or 3 months to ensure continued resolution. 2. New infiltrate in the right lung base. Given opacification of right lower lobe bronchi, aspiration should be considered. Pneumonia could have a similar appearance. Recommend attention on follow-up. 3. Reactive nodes in the right hilum and mediastinum as above. Recommend attention on follow-up. ____________________________________________   PROCEDURES  Procedure(s) performed (including Critical Care):  Procedures   ____________________________________________   INITIAL IMPRESSION / ASSESSMENT AND PLAN / ED COURSE     Patient presents with above to history exam for assessment of 2 falls yesterday associate with some new weakness in his bilateral lower extremities.  Patient is also complaining some right-sided chest pain where he states he  fell yesterday.  With regard to falls yesterday no obvious injuries on exam of the face scalp head or neck and patient is neurovascular intact in his bilateral upper extremities.  CT head and C-spine showed no clear acute fracture or dislocation or intracranial hemorrhage.  Unclear if lower extremity weakness is related to this or not as it seems it preceded the falls and I suspect is likely a separate issue.  Patient is weak on exam.  However he does have intact patellar reflexes and good rectal tone.  He denies any history of IV drug use, cancer or chronic steroid use and has no findings on exam to suggest epidural abscess.  Pelvic x-ray is unremarkable.  Overall very low suspicion for acute cord compression.  Of note while patient initially states he only drank 1 beer today his alcohol did return at 340.  Patient has not been quite truthful and suspect this is likely the etiology of his weakness.  With regard to the pain in the right chest for patient states he fell he is mildly tender over the right lower ribs.  Plain film of the chest shows improving cavitary pneumonia in the left but no rib fractures.  There is also possible small early developing infiltrate on the right.  However patient denies any new shortness of breath cough or fever and has no leukocytosis.  Lower suspicion for acute bacterial pneumonia at this time. CTA chest without evidence of PE, pericardial effusion, large pleural effusion with improvement in left-sided cavitary pneumonia compared to last imaging and no infiltration in the right base consistent with possible aspiration.  There is also some reactive lymphadenopathy in the right hilum.  No other clear acute thoracic process.  ECG without clear ischemic changes and given nonelevated troponin x2 I have a low suspicion for ACS at this time.  There is possible infiltrate on CTA suspect possible pneumonitis from aspiration.  Lower suspicion for acute bacterial  pneumonia and do not  feel there is indication for antibiotics at this time given patient has no fever, leukocytosis and undetectable procalcitonin.  BMP shows no significant electrode or metabolic derangements.  UA shows no evidence of cystitis.  Based not consistent with acute pancreatitis.  Magnesium low at 1.5.  This was repleted.  Suspect right-sided chest pain possibly related to contusion from fall yesterday which are likely secondary to alcohol intoxication.  On repeat trial of ambulation by RN patient noted to be able to stand and exhibited a bouncing motion with his knees slowly lower himself down words until he was helped back to his bed.  This appears grossly volitional to this examiner.  On several reassessments patient seemed to have improvement in his strength.  On my final reassessment he was able to stand and take several steps without assistance.  After approximately 5 and half hours of observation I think he is now clinically sober given improvement in weakness with otherwise reassuring exam and work-up I think he is stable for outpatient follow-up.  Counseled patient importance of alcohol cessation and recommendation for close [facility] follow-up to assist with this.  Discharged stable condition.  Strict return precautions advised and discussed.   ____________________________________________   FINAL CLINICAL IMPRESSION(S) / ED DIAGNOSES  Final diagnoses:  Recurrent falls  Alcohol abuse  Weakness  Hypomagnesemia  Aspiration pneumonia of right lung, unspecified aspiration pneumonia type, unspecified part of lung (HCC)    Medications  magnesium sulfate IVPB 4 g 100 mL (4 g Intravenous New Bag/Given 03/31/21 2044)  LORazepam (ATIVAN) injection 0-4 mg (0 mg Intravenous Not Given 03/31/21 2056)    Or  LORazepam (ATIVAN) tablet 0-4 mg ( Oral See Alternative 03/31/21 2056)  LORazepam (ATIVAN) injection 0-4 mg (has no administration in time range)    Or  LORazepam (ATIVAN) tablet 0-4 mg (has no  administration in time range)  thiamine tablet 100 mg (has no administration in time range)    Or  thiamine (B-1) injection 100 mg (has no administration in time range)  iohexol (OMNIPAQUE) 350 MG/ML injection 100 mL (100 mLs Intravenous Contrast Given 03/31/21 1910)     ED Discharge Orders     None        Note:  This document was prepared using Dragon voice recognition software and may include unintentional dictation errors.    Gilles Chiquito, MD 03/31/21 2131

## 2021-03-31 NOTE — ED Notes (Signed)
E signature pad not working. Pt educated on discharge instructions and verbalized understanding.  

## 2021-03-31 NOTE — ED Notes (Signed)
Attempted to ambulate patient. He was able to swing his legs over the side of the bed but required assistance to stand up fully. When fully upright at bedside, pt was unable to take a step and slowly started sinking towards the floor in a bouncing motion. It appeared that his knees were giving out and he would catch himself intermittently. Pt assisted back into upright position and returned to bed. Dr Katrinka Blazing notified.

## 2021-04-01 LAB — ETHANOL: Alcohol, Ethyl (B): 340 mg/dL (ref <10)

## 2021-04-07 LAB — ACID FAST CULTURE WITH REFLEXED SENSITIVITIES (MYCOBACTERIA)
Acid Fast Culture: NEGATIVE
Acid Fast Culture: NEGATIVE

## 2021-04-09 LAB — ACID FAST CULTURE WITH REFLEXED SENSITIVITIES (MYCOBACTERIA): Acid Fast Culture: NEGATIVE

## 2021-05-18 ENCOUNTER — Emergency Department
Admission: EM | Admit: 2021-05-18 | Discharge: 2021-05-18 | Disposition: A | Discharge status: Home / Self Care | Payer: Medicaid Other | Attending: Emergency Medicine | Admitting: Emergency Medicine

## 2021-05-18 ENCOUNTER — Other Ambulatory Visit: Payer: Self-pay

## 2021-05-18 DIAGNOSIS — M79603 Pain in arm, unspecified: Secondary | ICD-10-CM | POA: Insufficient documentation

## 2021-05-18 DIAGNOSIS — Z5321 Procedure and treatment not carried out due to patient leaving prior to being seen by health care provider: Secondary | ICD-10-CM | POA: Insufficient documentation

## 2021-05-18 DIAGNOSIS — G5793 Unspecified mononeuropathy of bilateral lower limbs: Secondary | ICD-10-CM | POA: Insufficient documentation

## 2021-05-18 NOTE — ED Triage Notes (Signed)
Pt comes via EMS with c/o neuropathy. Pt states he has this in both legs. Pt stats his leg is numb. Pt states some arm pain.

## 2021-05-18 NOTE — ED Notes (Signed)
Patient not in room, unable to find in restrooms or lobby

## 2021-10-11 ENCOUNTER — Encounter: Payer: Self-pay | Admitting: Emergency Medicine

## 2021-10-11 ENCOUNTER — Emergency Department
Admission: EM | Admit: 2021-10-11 | Discharge: 2021-10-11 | Disposition: A | Discharge status: Home / Self Care | Payer: Medicaid Other | Attending: Emergency Medicine | Admitting: Emergency Medicine

## 2021-10-11 ENCOUNTER — Other Ambulatory Visit: Payer: Self-pay

## 2021-10-11 DIAGNOSIS — R030 Elevated blood-pressure reading, without diagnosis of hypertension: Secondary | ICD-10-CM | POA: Insufficient documentation

## 2021-10-11 DIAGNOSIS — L309 Dermatitis, unspecified: Secondary | ICD-10-CM | POA: Insufficient documentation

## 2021-10-11 MED ORDER — HYDROXYZINE HCL 25 MG PO TABS: 25.0000 mg | ORAL_TABLET | Freq: Four times a day (QID) | ORAL | 0 refills | Status: DC | PRN

## 2021-10-11 MED ORDER — TRIAMCINOLONE ACETONIDE 0.5 % EX OINT: 1.0000 "application " | TOPICAL_OINTMENT | Freq: Two times a day (BID) | CUTANEOUS | 2 refills | Status: DC

## 2021-10-11 NOTE — ED Provider Triage Note (Signed)
Emergency Medicine Provider Triage Evaluation Note  Lance Green , a 46 y.o. male  was evaluated in triage.  Pt complains of rash for 1 week.  Patient denies any new lotions or medications.  Review of Systems  Positive: Positive itching upper extremity and facial area. Negative: No fever, difficulty breathing or shortness of breath.  Physical Exam  There were no vitals taken for this visit. Gen:   Awake, no distress   Resp:  Normal effort lungs are clear bilaterally. MSK:   Moves extremities without difficulty  Other:  Skin noted dry, flaky macular areas on the upper extremities and on the forehead.  No papules, vesicles or pustules are seen.  Medical Decision Making  Medically screening exam initiated at 2:24 PM.  Appropriate orders placed.  [name] was informed that the remainder of the evaluation will be completed by another provider, this initial triage assessment does not replace that evaluation, and the importance of remaining in the ED until their evaluation is complete.     Tommi Rumps, PA-C 10/11/21 1526

## 2021-10-11 NOTE — ED Triage Notes (Signed)
C/O Itching and burning to skin to neck arms and mid chest.  Symptoms x 1 week.

## 2021-10-11 NOTE — ED Provider Notes (Signed)
[facility] Provider Note    None    (approximate)   History   Skin Problem   HPI  Lance Green is a 46 y.o. male presents to the ED with complaint of rash.  Patient states that it has been itching.  He also reports that skin is very "thick" and he has been rubbing lotion on the areas without any improvement.  Patient denies any difficulty breathing, difficulty swallowing.  Patient with a history of hypertension and currently not taking any medication, alcohol abuse, seizures and history of peptic ulcer disease.      Physical Exam   Triage Vital Signs: ED Triage Vitals  Enc Vitals Group     BP 10/11/21 1428 (!) 176/106     Pulse Rate 10/11/21 1428 88     Resp 10/11/21 1428 16     Temp 10/11/21 1428 98.3 F (36.8 C)     Temp Source 10/11/21 1428 Oral     SpO2 10/11/21 1428 97 %     Weight 10/11/21 1426 139 lb 15.9 oz (63.5 kg)     Height 10/11/21 1426 5\' 9"  (1.753 m)     Head Circumference --      Peak Flow --      Pain Score 10/11/21 1425 0     Pain Loc --      Pain Edu? --      Excl. in GC? --     Most recent vital signs: Vitals:   10/11/21 1428  BP: (!) 176/106  Pulse: 88  Resp: 16  Temp: 98.3 F (36.8 C)  SpO2: 97%     General: Awake, no distress.  Itching frequently. CV:  Good peripheral perfusion.  Heart regular rate and rhythm. Resp:  Normal effort.  Lungs are clear bilaterally. Abd:  No distention.  Other:  In the antecubital area both upper extremities skin is thickened with macular papular rash isolated to that area.  There is some erythema present.  Patient also has thick extremely dry skin noted to the facial area.  Skin on forehead is also flaky.   ED Results / Procedures / Treatments   Labs (all labs ordered are listed, but only abnormal results are displayed) Labs Reviewed - No data to display   PROCEDURES:  Critical Care performed: No  Procedures   MEDICATIONS ORDERED IN ED: Medications - No data to  display   IMPRESSION / MDM / ASSESSMENT AND PLAN / ED COURSE  I reviewed the triage vital signs and the nursing notes.                              Differential diagnosis includes, but is not limited to, dyshidrosis, contact dermatitis, eczema, psoriasis.  46 year old male presents to the ED with rash that is extremely dry and itchy.  Areas observed are suggestive of eczema.  He was given prescription for Atarax to help with itching and a prescription for Kenalog ointment to apply to the area twice daily.  He was given a list of primary care clinics to have his blood pressure rechecked as it was elevated.  He has a history of hypertension but currently is not taking any blood pressure medication.  Also he is to follow-up with either [facility] or [facility] for further evaluation of his rash if not improving with the ointment and Atarax.      FINAL CLINICAL IMPRESSION(S) / ED DIAGNOSES  Final diagnoses:  Eczema, unspecified type  Elevated blood pressure reading     Rx / DC Orders   ED Discharge Orders          Ordered    hydrOXYzine (ATARAX) 25 MG tablet  Every 6 hours PRN        10/11/21 1429    triamcinolone ointment (KENALOG) 0.5 %  2 times daily        10/11/21 1429             Note:  This document was prepared using Dragon voice recognition software and may include unintentional dictation errors.   Tommi Rumps, PA-C 10/11/21 1525    Gilles Chiquito, MD 10/11/21 [phone]

## 2021-10-11 NOTE — Discharge Instructions (Signed)
Your rash appears to be eczema.  2 prescriptions were sent to the pharmacy.  1 is a pill for itching and the other is a ointment to apply to the areas to help with itching and also help this calm down.  You may need to see a dermatologist in their offices are listed on your discharge papers.  There are 2 in [place] 1 is [facility] and the other [facility].  There are also several clinics listed so that you can obtain a primary care provider.  Also you will need to have your blood pressure rechecked as it was elevated in the emergency department today.

## 2022-01-06 ENCOUNTER — Emergency Department
Admission: EM | Admit: 2022-01-06 | Discharge: 2022-01-06 | Disposition: A | Discharge status: Home / Self Care | Payer: Medicaid Other | Attending: Emergency Medicine | Admitting: Emergency Medicine

## 2022-01-06 ENCOUNTER — Other Ambulatory Visit: Payer: Self-pay

## 2022-01-06 DIAGNOSIS — R200 Anesthesia of skin: Secondary | ICD-10-CM | POA: Insufficient documentation

## 2022-01-06 DIAGNOSIS — Z5321 Procedure and treatment not carried out due to patient leaving prior to being seen by health care provider: Secondary | ICD-10-CM | POA: Insufficient documentation

## 2022-01-06 LAB — CBC
HCT: 40.4 % (ref 39.0–52.0)
Hemoglobin: 13.8 g/dL (ref 13.0–17.0)
MCH: 31.9 pg (ref 26.0–34.0)
MCHC: 34.2 g/dL (ref 30.0–36.0)
MCV: 93.5 fL (ref 80.0–100.0)
Platelets: 194 10*3/uL (ref 150–400)
RBC: 4.32 MIL/uL (ref 4.22–5.81)
RDW: 13.9 % (ref 11.5–15.5)
WBC: 7.5 10*3/uL (ref 4.0–10.5)
nRBC: 0 % (ref 0.0–0.2)

## 2022-01-06 LAB — COMPREHENSIVE METABOLIC PANEL
ALT: 53 U/L — ABNORMAL HIGH (ref 0–44)
AST: 129 U/L — ABNORMAL HIGH (ref 15–41)
Albumin: 4.2 g/dL (ref 3.5–5.0)
Alkaline Phosphatase: 127 U/L — ABNORMAL HIGH (ref 38–126)
Anion gap: 12 (ref 5–15)
BUN: 7 mg/dL (ref 6–20)
CO2: 22 mmol/L (ref 22–32)
Calcium: 8.8 mg/dL — ABNORMAL LOW (ref 8.9–10.3)
Chloride: 105 mmol/L (ref 98–111)
Creatinine, Ser: 0.75 mg/dL (ref 0.61–1.24)
GFR, Estimated: >60 mL/min (ref >60)
Glucose, Bld: 107 mg/dL — ABNORMAL HIGH (ref 70–99)
Potassium: 3.8 mmol/L (ref 3.5–5.1)
Sodium: 139 mmol/L (ref 135–145)
Total Bilirubin: 0.4 mg/dL (ref 0.3–1.2)
Total Protein: 8.6 g/dL — ABNORMAL HIGH (ref 6.5–8.1)

## 2022-01-06 NOTE — ED Triage Notes (Signed)
FIRST NURSE NOTE:  ?Pt via POV from home. Pt c/o bilateral hands and feet numbness, pt has a hx of peripheral neuropathy but states it got worse today. Pt is A&Ox4 and NAD ? ?CBG 123  ?HR 88 ?98% on RA  ?150/110 BP  ?

## 2022-01-06 NOTE — ED Triage Notes (Deleted)
Pt c/o N/V/D since Friday, states family member in the house had same sx ?

## 2022-01-25 ENCOUNTER — Other Ambulatory Visit: Payer: Self-pay

## 2022-01-25 ENCOUNTER — Emergency Department
Admission: EM | Admit: 2022-01-25 | Discharge: 2022-01-26 | Disposition: A | Discharge status: Home / Self Care | Payer: Medicaid Other | Attending: Emergency Medicine | Admitting: Emergency Medicine

## 2022-01-25 ENCOUNTER — Emergency Department: Payer: Medicaid Other

## 2022-01-25 DIAGNOSIS — Y908 Blood alcohol level of 240 mg/100 ml or more: Secondary | ICD-10-CM | POA: Insufficient documentation

## 2022-01-25 DIAGNOSIS — Z79899 Other long term (current) drug therapy: Secondary | ICD-10-CM | POA: Insufficient documentation

## 2022-01-25 DIAGNOSIS — G629 Polyneuropathy, unspecified: Secondary | ICD-10-CM

## 2022-01-25 DIAGNOSIS — F1010 Alcohol abuse, uncomplicated: Secondary | ICD-10-CM | POA: Insufficient documentation

## 2022-01-25 DIAGNOSIS — I10 Essential (primary) hypertension: Secondary | ICD-10-CM | POA: Insufficient documentation

## 2022-01-25 LAB — CBC
HCT: 42.2 % (ref 39.0–52.0)
Hemoglobin: 14.4 g/dL (ref 13.0–17.0)
MCH: 32.1 pg (ref 26.0–34.0)
MCHC: 34.1 g/dL (ref 30.0–36.0)
MCV: 94 fL (ref 80.0–100.0)
Platelets: 173 10*3/uL (ref 150–400)
RBC: 4.49 MIL/uL (ref 4.22–5.81)
RDW: 12.8 % (ref 11.5–15.5)
WBC: 5.3 10*3/uL (ref 4.0–10.5)
nRBC: 0 % (ref 0.0–0.2)

## 2022-01-25 NOTE — ED Provider Notes (Signed)
? ?[facility] ?Provider Note ? ? ? Event Date/Time  ? First MD Initiated Contact with Patient 01/25/22 2335   ?  (approximate) ? ? ?History  ? ?Extremity Weakness ? ? ?HPI ? ?Lance Green is a 46 y.o. male with a history of alcohol abuse, hypertension, neuropathy, who presents for evaluation of numbness.  Patient reports whole body numbness that has been ongoing for about 2 years.  He has been told in the past that the symptoms are due to neuropathy from his chronic alcohol abuse.  Patient reports that today he was trying to get to the bus stop and the numbness got worse.  He felt for a brief moment that he could not move his whole body which prompted him to call 911.  He reports no weakness and no localized numbness.  Reports that the numbness is all over his body.  He denies trauma, headache, neck pain, back pain, chest pain, abdominal pain, facial droop, slurred speech, unilateral weakness or numbness.  Positive EtOH today.  Denies any drug use. ?  ? ? ?Past Medical History:  ?Diagnosis Date  ? Alcohol abuse   ? HTN (hypertension)   ? Neuropathy   ? Pancreatitis   ? Peptic ulcer disease   ? ? ?Past Surgical History:  ?Procedure Laterality Date  ? APPENDECTOMY    ? INCISION AND DRAINAGE Left 11/11/2018  ? Procedure: INCISION AND DRAINAGE;  Surgeon: Donato Heinz, MD;  Location: [facility] ORS;  Service: Orthopedics;  Laterality: Left;  ? LAPAROSCOPIC APPENDECTOMY N/A 07/06/2018  ? Procedure: APPENDECTOMY LAPAROSCOPIC;  Surgeon: Carolan Shiver, MD;  Location: [facility] ORS;  Service: General;  Laterality: N/A;  ? ? ? ?Physical Exam  ? ?Triage Vital Signs: ?ED Triage Vitals  ?Enc Vitals Group  ?   BP 01/25/22 2121 (!) 136/93  ?   Pulse Rate 01/25/22 2121 81  ?   Resp 01/25/22 2121 16  ?   Temp 01/25/22 2121 98 ?F (36.7 ?C)  ?   Temp src --   ?   SpO2 01/25/22 2121 98 %  ?   Weight 01/25/22 2122 150 lb (68 kg)  ?   Height 01/25/22 2122 5\' 8"  (1.727 m)  ?   Head Circumference --   ?   Peak Flow --    ?   Pain Score 01/25/22 2121 8  ?   Pain Loc --   ?   Pain Edu? --   ?   Excl. in GC? --   ? ? ?Most recent vital signs: ?Vitals:  ? 01/25/22 2321 01/26/22 0300  ?BP: (!) 140/92 106/61  ?Pulse: 71 73  ?Resp: 18 16  ?Temp:    ?SpO2: 99% 92%  ? ? ? ?Constitutional: Alert and oriented. Well appearing and in no apparent distress. ?HEENT: ?     Head: Normocephalic and atraumatic.    ?     Eyes: Conjunctivae are normal. Sclera is non-icteric.  ?     Mouth/Throat: Mucous membranes are moist.  ?     Neck: Supple with no signs of meningismus. ?Cardiovascular: Regular rate and rhythm. No murmurs, gallops, or rubs. 2+ symmetrical distal pulses are present in all extremities.  ?Respiratory: Normal respiratory effort. Lungs are clear to auscultation bilaterally.  ?Gastrointestinal: Soft, non tender, and non distended with positive bowel sounds. No rebound or guarding. ?Genitourinary: No CVA tenderness. ?Musculoskeletal:  No edema, cyanosis, or erythema of extremities. ?Neurologic: Normal speech and language. Face is symmetric. Moving all  extremities.  Intact strength and sensation x4  ?skin: Skin is warm, dry and intact. No rash noted. ?Psychiatric: Mood and affect are normal. Speech and behavior are normal. ? ?ED Results / Procedures / Treatments  ? ?Labs ?(all labs ordered are listed, but only abnormal results are displayed) ?Labs Reviewed  ?COMPREHENSIVE METABOLIC PANEL - Abnormal; Notable for the following components:  ?    Result Value  ? Total Protein 8.5 (*)   ? AST 170 (*)   ? ALT 68 (*)   ? Alkaline Phosphatase 127 (*)   ? All other components within normal limits  ?URINALYSIS, ROUTINE W REFLEX MICROSCOPIC - Abnormal; Notable for the following components:  ? Color, Urine STRAW (*)   ? APPearance CLEAR (*)   ? Specific Gravity, Urine 1.002 (*)   ? Hgb urine dipstick SMALL (*)   ? Protein, ur 100 (*)   ? All other components within normal limits  ?ETHANOL - Abnormal; Notable for the following components:  ? Alcohol,  Ethyl (B) 322 (*)   ? All other components within normal limits  ?CBC  ?MAGNESIUM  ?TSH  ?URINE DRUG SCREEN, QUALITATIVE ([facility] ONLY)  ? ? ? ?EKG ? ?ED ECG REPORT ?I, Nita Sickle, the attending physician, personally viewed and interpreted this ECG. ? ?Sinus rhythm with a rate of 72, normal intervals, normal axis, no ST elevations or depressions. ? ?RADIOLOGY ?I, Nita Sickle, attending MD, have personally viewed and interpreted the images obtained during this visit as below: ? ?CT head with no acute pathology ? ? ?___________________________________________________ ?Interpretation by Radiologist:  ?CT Head Wo Contrast ? ?Result Date: 01/26/2022 ?CLINICAL DATA:  Mental status change, unknown cause. EXAM: CT HEAD WITHOUT CONTRAST TECHNIQUE: Contiguous axial images were obtained from the base of the skull through the vertex without intravenous contrast. RADIATION DOSE REDUCTION: This exam was performed according to the departmental dose-optimization program which includes automated exposure control, adjustment of the mA and/or kV according to patient size and/or use of iterative reconstruction technique. COMPARISON:  Head CT 03/31/2021. FINDINGS: Brain: No evidence of acute infarction, hemorrhage, hydrocephalus, extra-axial collection or mass lesion/mass effect. There is mild cerebral cortical atrophy, slight cerebellar atrophy. Vascular: There are scattered calcifications in the carotid siphons. There are no hyperdense central vasculature. Skull: Normal. Negative for fracture or focal lesion. Sinuses/Orbits: There is mild membrane thickening in the ethmoid and maxillary sinuses without fluid level. Other visible sinuses are clear. The left mastoid air cells are clear. There is trace fluid in the right mastoid tip. Other: None. IMPRESSION: 1. No acute intracranial CT findings. Mild atrophy, but more than typical at this age. 2. 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 Electronically  Signed   By: Almira Bar M.D.   On: 01/26/2022 00:27   ? ? ? ?PROCEDURES: ? ?Critical Care performed: No ? ?Procedures ? ? ? ?IMPRESSION / MDM / ASSESSMENT AND PLAN / ED COURSE  ?I reviewed the triage vital signs and the nursing notes. ? ?46 y.o. male with a history of alcohol abuse, hypertension, neuropathy, who presents for evaluation of numbness.  Patient is complaining of whole body numbness x2 years which was briefly worse tonight.  He is back to his baseline.  He is neurologically intact with no focal neurodeficits on exam. ? ?Ddx: Neuropathy versus alcohol intoxication versus electrolyte derangements versus subdural hematoma in the setting of chronic alcohol abuse versus DKA ? ? ?Plan: EKG, CBC, CMP, TSH, UDS, ethanol level, urinalysis ? ? ?  MEDICATIONS GIVEN IN ED: ?Medications - No data to display ? ? ?ED COURSE: CT head with no acute pathology.  UDS negative.  Alcohol level of 322.  No significant electrolyte derangements with normal potassium and magnesium.  TSH is normal.  No AKI.  Patient has transaminitis which is unchanged from baseline most likely from alcohol abuse.  I do believe patient's presentation is consistent with neuropathy from alcohol abuse.  Discussed this with him.  We will keep patient in the ER until he is able to metabolize the alcohol level and is clinically sober and stable for discharge home.  No indication for admission at this time. ? ? ?Consults: None ? ? ?EMR reviewed including records from patient's last admission to the hospital for a cavitary pneumonia from May 2022 ? ? ? ?FINAL CLINICAL IMPRESSION(S) / ED DIAGNOSES  ? ?Final diagnoses:  ?Neuropathy  ?Alcohol abuse  ? ? ? ?Rx / DC Orders  ? ?ED Discharge Orders   ? ? None  ? ?  ? ? ? ?Note:  This document was prepared using Dragon voice recognition software and may include unintentional dictation errors. ? ? ?Please note:  Patient was evaluated in Emergency Department today for the symptoms described in the history of  present illness. Patient was evaluated in the context of the global COVID-19 pandemic, which necessitated consideration that the patient might be at risk for infection with the SARS-CoV-2 virus that causes CO

## 2022-01-25 NOTE — ED Triage Notes (Signed)
FIRST NURSE NOTE:  Pt arrived via [facility] reports leg weakness x 30 minutes, no-adherent with medications unable to afford meds. ? ? ?180/103  p 80 100% RA RR13 ?

## 2022-01-25 NOTE — ED Triage Notes (Signed)
[place] with c/o leg weakness. Pt sts it has been ongoing for years. Says when this happens he has trouble moving all 4 extremities.  ? ?Sometimes when writing his name all he can make is an "x".  ?

## 2022-01-26 LAB — URINE DRUG SCREEN, QUALITATIVE (ARMC ONLY)
Amphetamines, Ur Screen: NOT DETECTED
Barbiturates, Ur Screen: NOT DETECTED
Benzodiazepine, Ur Scrn: NOT DETECTED
Cannabinoid 50 Ng, Ur ~~LOC~~: NOT DETECTED
Cocaine Metabolite,Ur ~~LOC~~: NOT DETECTED
MDMA (Ecstasy)Ur Screen: NOT DETECTED
Methadone Scn, Ur: NOT DETECTED
Opiate, Ur Screen: NOT DETECTED
Phencyclidine (PCP) Ur S: NOT DETECTED
Tricyclic, Ur Screen: NOT DETECTED

## 2022-01-26 LAB — URINALYSIS, ROUTINE W REFLEX MICROSCOPIC
Bacteria, UA: NONE SEEN
Bilirubin Urine: NEGATIVE
Glucose, UA: NEGATIVE mg/dL
Ketones, ur: NEGATIVE mg/dL
Leukocytes,Ua: NEGATIVE
Nitrite: NEGATIVE
Protein, ur: 100 mg/dL — AB
Specific Gravity, Urine: 1.002 — ABNORMAL LOW (ref 1.005–1.030)
Squamous Epithelial / HPF: NONE SEEN (ref 0–5)
WBC, UA: NONE SEEN WBC/hpf (ref 0–5)
pH: 5 (ref 5.0–8.0)

## 2022-01-26 LAB — COMPREHENSIVE METABOLIC PANEL
ALT: 68 U/L — ABNORMAL HIGH (ref 0–44)
AST: 170 U/L — ABNORMAL HIGH (ref 15–41)
Albumin: 4.4 g/dL (ref 3.5–5.0)
Alkaline Phosphatase: 127 U/L — ABNORMAL HIGH (ref 38–126)
Anion gap: 13 (ref 5–15)
BUN: 6 mg/dL (ref 6–20)
CO2: 22 mmol/L (ref 22–32)
Calcium: 9 mg/dL (ref 8.9–10.3)
Chloride: 109 mmol/L (ref 98–111)
Creatinine, Ser: 0.7 mg/dL (ref 0.61–1.24)
GFR, Estimated: >60 mL/min (ref >60)
Glucose, Bld: 96 mg/dL (ref 70–99)
Potassium: 3.6 mmol/L (ref 3.5–5.1)
Sodium: 144 mmol/L (ref 135–145)
Total Bilirubin: 0.6 mg/dL (ref 0.3–1.2)
Total Protein: 8.5 g/dL — ABNORMAL HIGH (ref 6.5–8.1)

## 2022-01-26 LAB — ETHANOL: Alcohol, Ethyl (B): 322 mg/dL (ref <10)

## 2022-01-26 LAB — MAGNESIUM: Magnesium: 1.9 mg/dL (ref 1.7–2.4)

## 2022-01-26 LAB — CK: Total CK: 384 U/L (ref 49–397)

## 2022-01-26 LAB — TSH: TSH: 1.376 u[IU]/mL (ref 0.350–4.500)

## 2022-01-26 IMAGING — CT CT HEAD W/O CM
4 series · 17 of 47 positions shown, 19 images · non-contrast
Comparison: Head CT [DATE].

CLINICAL DATA: Mental status change, unknown cause.



[Series 2: head bone · axial · 0.44mm/px · z∈[-62,-6]mm · 4 of 83 slices shown]
[im 9/83  bone]
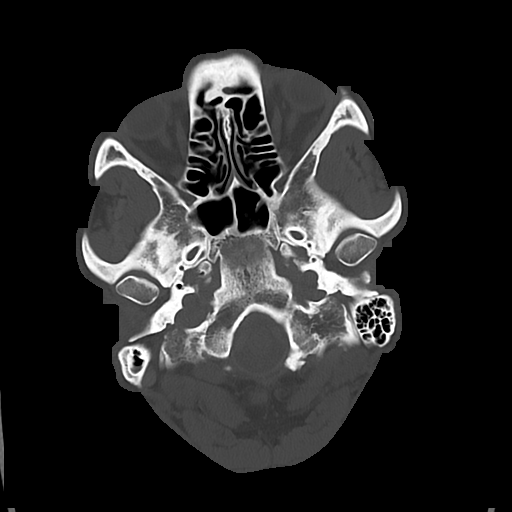
[im 17/83  bone]
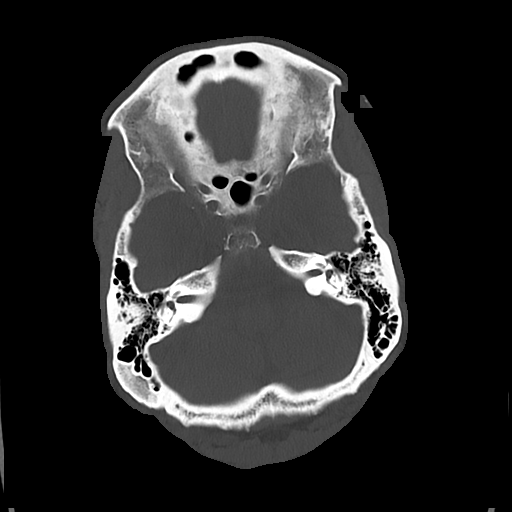
[im 25/83  bone]
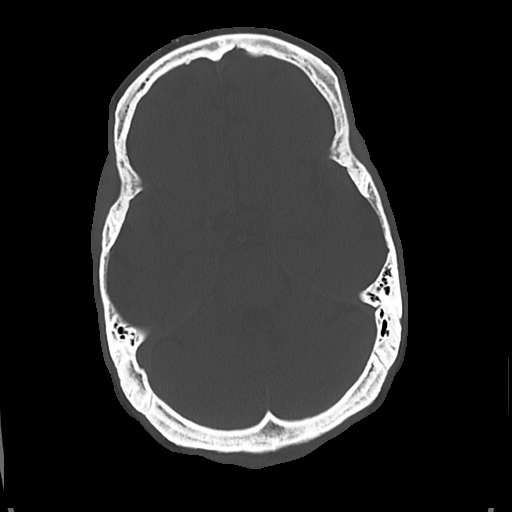
[im 37/83  bone]
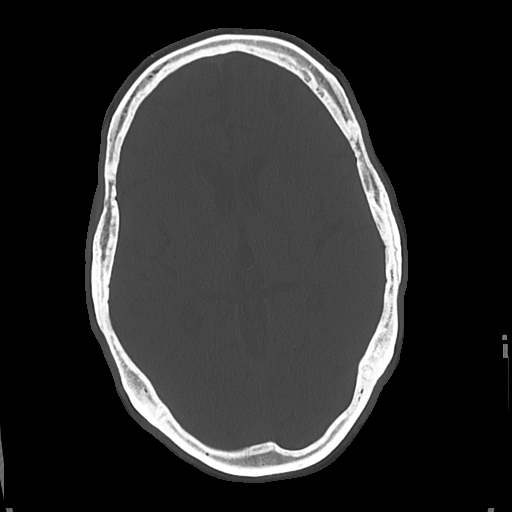

[Series 3: head wo · axial · 0.44mm/px · z∈[-58,+62]mm · 7 of 33 slices shown, 9 images]
[im 5/33  brain]
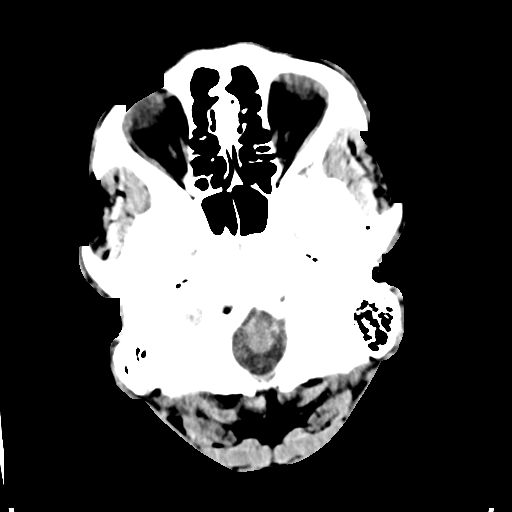
[im 5/33  bone]
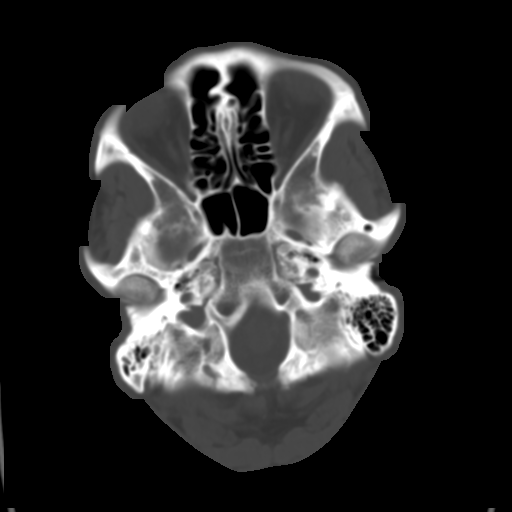
[im 9/33  brain]
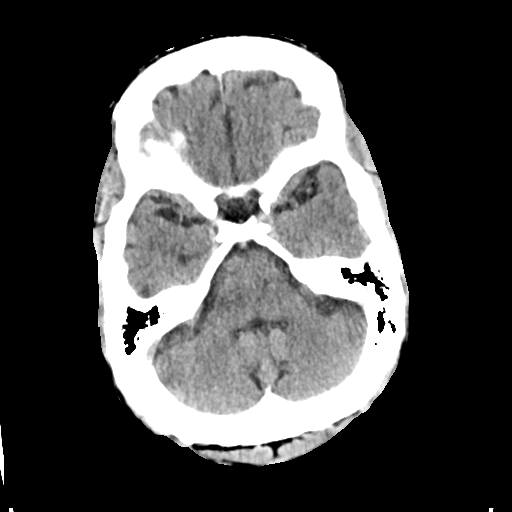
[im 13/33  brain]
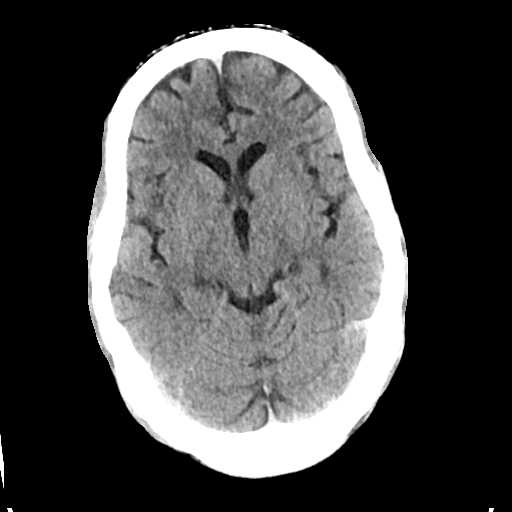
[im 17/33  brain]
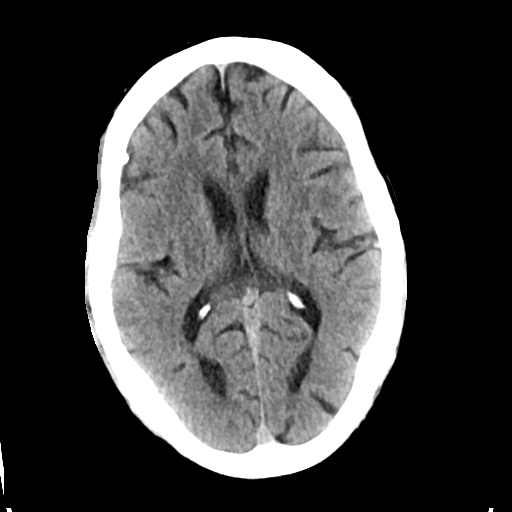
[im 21/33  brain]
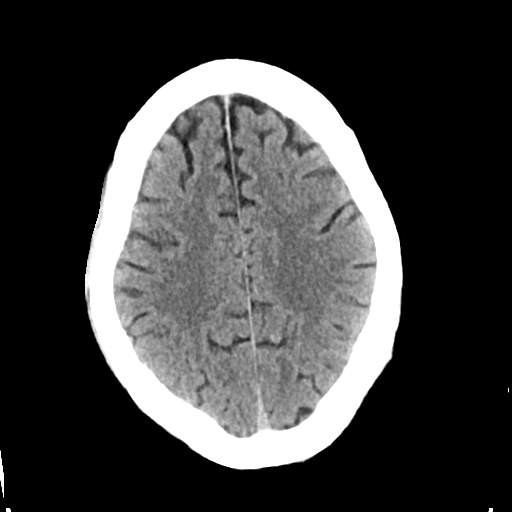
[im 21/33  bone]
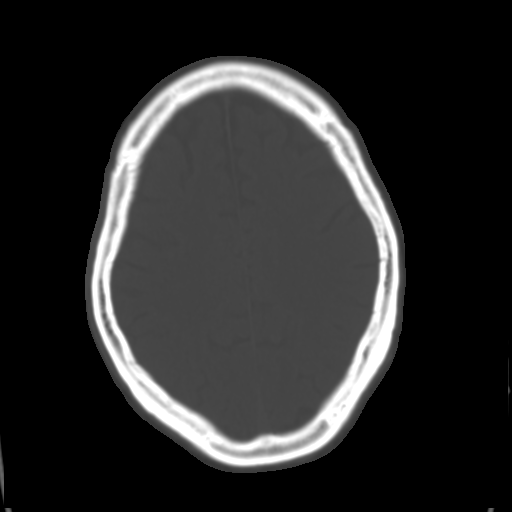
[im 25/33  brain]
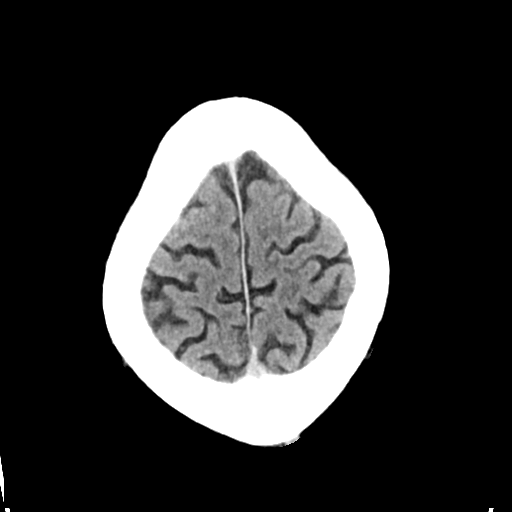
[im 29/33  brain]
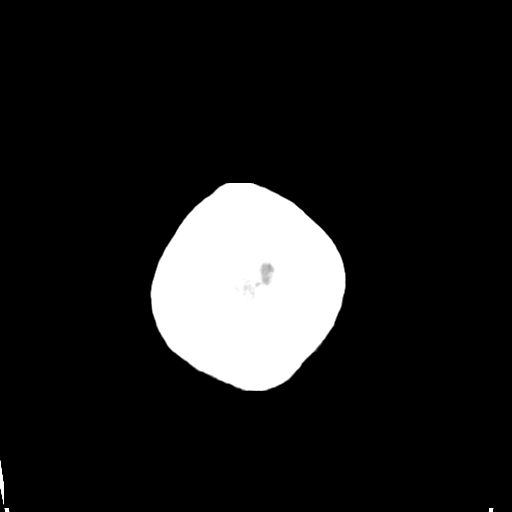

[Series 4: cor soft · coronal · 0.35mm/px · 3 of 70 slices shown]
[im 24/70  brain]
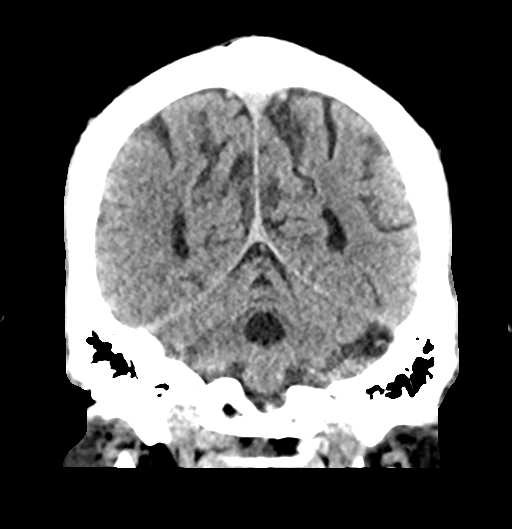
[im 31/70  brain]
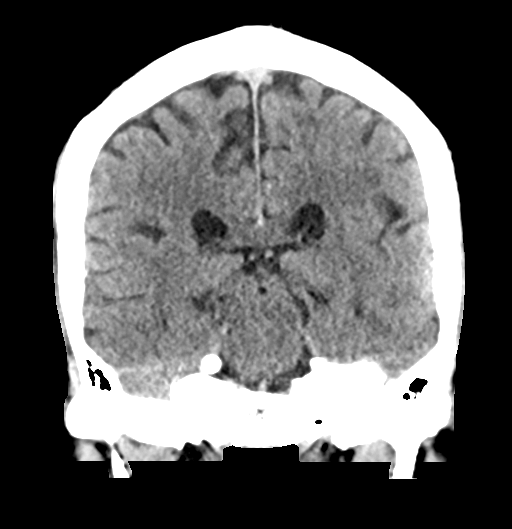
[im 39/70  brain]
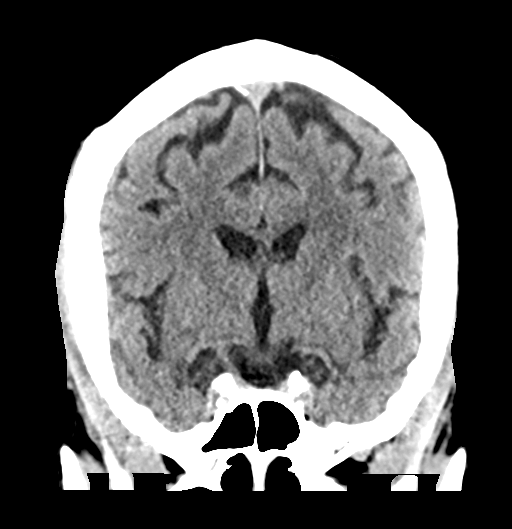

[Series 5: sag soft · sagittal · 0.35mm/px · 3 of 59 slices shown]
[im 20/59  brain]
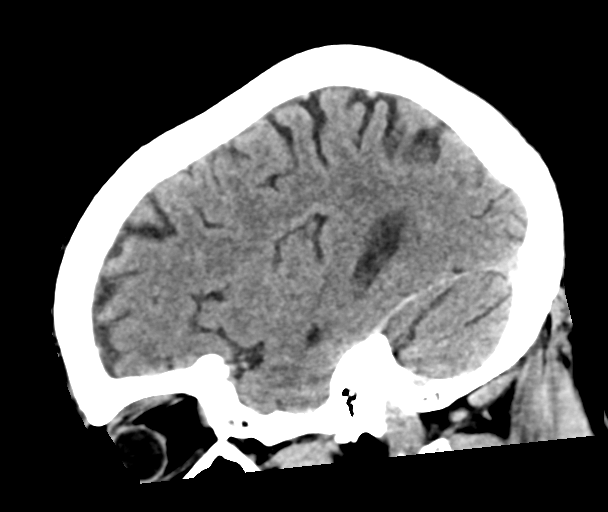
[im 30/59  brain]
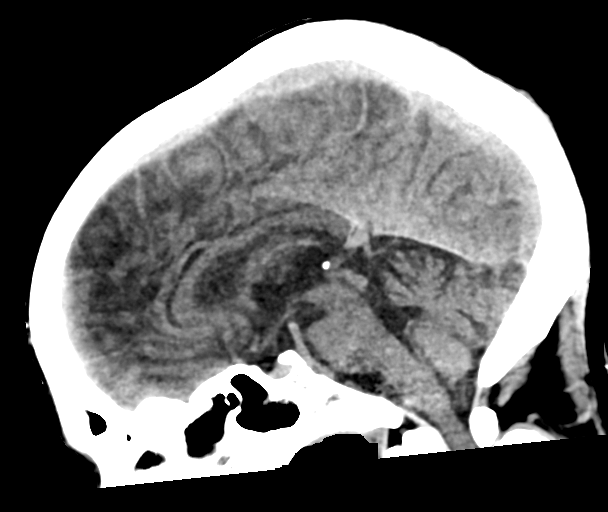
[im 39/59  brain]
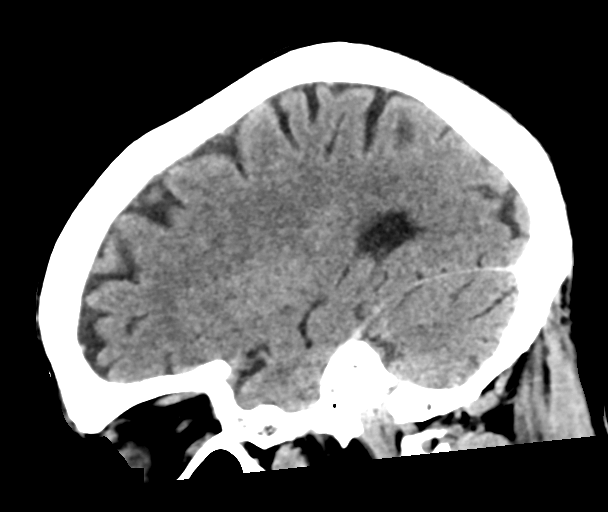

[17 of 47 positions shown; findings below may reference images not displayed]

FINDINGS: Brain: No evidence of acute infarction, hemorrhage, hydrocephalus,
extra-axial collection or mass lesion/mass effect. There is mild
cerebral cortical atrophy, slight cerebellar atrophy.

Vascular: There are scattered calcifications in the carotid siphons.
There are no hyperdense central vasculature.

Skull: Normal. Negative for fracture or focal lesion.

Sinuses/Orbits: There is mild membrane thickening in the ethmoid and
maxillary sinuses without fluid level. Other visible sinuses are
clear.

The left mastoid air cells are clear. There is trace fluid in the
right mastoid tip.

Other: None.
IMPRESSION: 1. No acute intracranial CT findings. Mild atrophy, but more than
typical at this age.
2. Sinus membrane disease with trace fluid in the right mastoid tip.

## 2022-01-26 MED ORDER — THIAMINE HCL 100 MG/ML IJ SOLN: 100.0000 mg | Freq: Every day | INTRAMUSCULAR | Status: DC

## 2022-01-26 MED ORDER — SODIUM CHLORIDE 0.9 % IV BOLUS
1000.0000 mL | Freq: Once | INTRAVENOUS | Status: AC
Start: 2022-01-26 — End: 2022-01-26
  Administered 2022-01-26: 1000 mL via INTRAVENOUS

## 2022-01-26 MED ORDER — SODIUM CHLORIDE 0.9 % IV SOLN
1.0000 mg | Freq: Once | INTRAVENOUS | Status: AC
  Administered 2022-01-26: 1 mg via INTRAVENOUS
  Filled 2022-01-26: qty 0.2

## 2022-01-26 MED ORDER — SODIUM CHLORIDE 0.9 % IV SOLN
INTRAVENOUS | Status: DC
  Filled 2022-01-26 (×2): qty 1000

## 2022-01-26 NOTE — ED Notes (Signed)
Pt resting in bed, even and unlabored respirations. NAD.  ?

## 2022-01-26 NOTE — Discharge Instructions (Addendum)
Please follow-up with your doctor or see this clinic with the prospect of a clinic or the [facility] or [facility] care or [place] care.  Do your best to try to stop drinking Alcoholics Anonymous may help.  Please do not hesitate to return if you need [place]. ?

## 2022-01-26 NOTE — ED Provider Notes (Signed)
----------------------------------------- ?  11:15 AM on 01/26/2022 ?----------------------------------------- ?Patient appears to be much better.  He is moving all extremities equally and well.  His urine was dark but his CK looks good we have given him some fluids and thiamine and folate etc.  I understand he is ready to go and he looks stable enough to go. ?  ?Arnaldo Natal, MD ?01/26/22 1115 ? ?

## 2022-01-26 NOTE — ED Notes (Signed)
[employer] called for transport. [place], LCSW okayed giving patient taxi voucher due to pt reporting no money with him at the current time. E-signature not working at this time. Pt verbalized understanding of D/C instructions, prescriptions and follow up care with no further questions at this time. Pt in NAD and wheeled to lobby at time of D/C. ? ?

## 2022-01-26 NOTE — ED Notes (Signed)
Patient to [facility], report received from [place], Therapist, sports.  Patient given warm blank for comfort. ?

## 2022-01-26 NOTE — ED Notes (Signed)
Pt reports unable to get ride home- this RN attempted to call [employer] with no answer at this time. ?

## 2022-02-22 ENCOUNTER — Emergency Department
Admission: EM | Admit: 2022-02-22 | Discharge: 2022-02-23 | Disposition: A | Discharge status: Home / Self Care | Payer: Self-pay | Attending: Emergency Medicine | Admitting: Emergency Medicine

## 2022-02-22 ENCOUNTER — Encounter: Payer: Self-pay | Admitting: Emergency Medicine

## 2022-02-22 DIAGNOSIS — G9009 Other idiopathic peripheral autonomic neuropathy: Secondary | ICD-10-CM | POA: Insufficient documentation

## 2022-02-22 DIAGNOSIS — F10129 Alcohol abuse with intoxication, unspecified: Secondary | ICD-10-CM | POA: Insufficient documentation

## 2022-02-22 DIAGNOSIS — G629 Polyneuropathy, unspecified: Secondary | ICD-10-CM

## 2022-02-22 DIAGNOSIS — Z79899 Other long term (current) drug therapy: Secondary | ICD-10-CM | POA: Insufficient documentation

## 2022-02-22 DIAGNOSIS — F10920 Alcohol use, unspecified with intoxication, uncomplicated: Secondary | ICD-10-CM

## 2022-02-22 DIAGNOSIS — Y908 Blood alcohol level of 240 mg/100 ml or more: Secondary | ICD-10-CM | POA: Insufficient documentation

## 2022-02-22 DIAGNOSIS — I10 Essential (primary) hypertension: Secondary | ICD-10-CM | POA: Insufficient documentation

## 2022-02-22 NOTE — ED Triage Notes (Signed)
Pt presents via EMS with peripheral neuropathy. Pt endorses pain in all 4 extremities. He was prescribed medication for neuropathy but has not been able to afford it.  ETOH use tonight 3-4 beers tonight and is incontinent of urine. Pt stated he is "unable to tell when I have to go". Denies falls, CP, or SOB.

## 2022-02-23 ENCOUNTER — Emergency Department: Payer: Self-pay

## 2022-02-23 LAB — BASIC METABOLIC PANEL
Anion gap: 12 (ref 5–15)
BUN: 6 mg/dL (ref 6–20)
CO2: 19 mmol/L — ABNORMAL LOW (ref 22–32)
Calcium: 8.8 mg/dL — ABNORMAL LOW (ref 8.9–10.3)
Chloride: 106 mmol/L (ref 98–111)
Creatinine, Ser: 0.66 mg/dL (ref 0.61–1.24)
GFR, Estimated: >60 mL/min (ref >60)
Glucose, Bld: 92 mg/dL (ref 70–99)
Potassium: 3.9 mmol/L (ref 3.5–5.1)
Sodium: 137 mmol/L (ref 135–145)

## 2022-02-23 LAB — CBC
HCT: 44.3 % (ref 39.0–52.0)
Hemoglobin: 15 g/dL (ref 13.0–17.0)
MCH: 32.3 pg (ref 26.0–34.0)
MCHC: 33.9 g/dL (ref 30.0–36.0)
MCV: 95.3 fL (ref 80.0–100.0)
Platelets: 214 10*3/uL (ref 150–400)
RBC: 4.65 MIL/uL (ref 4.22–5.81)
RDW: 13 % (ref 11.5–15.5)
WBC: 6.1 10*3/uL (ref 4.0–10.5)
nRBC: 0 % (ref 0.0–0.2)

## 2022-02-23 LAB — URIC ACID: Uric Acid, Serum: 7.9 mg/dL (ref 3.7–8.6)

## 2022-02-23 LAB — TROPONIN I (HIGH SENSITIVITY): Troponin I (High Sensitivity): 14 ng/L (ref <18)

## 2022-02-23 LAB — ETHANOL: Alcohol, Ethyl (B): 343 mg/dL (ref <10)

## 2022-02-23 IMAGING — CT CT HEAD W/O CM
4 series · 16 of 47 positions shown, 18 images · non-contrast
Comparison: None Available.

CLINICAL DATA: Altered mental status



[Series 2: head wo · axial · 0.44mm/px · z∈[-172,-52]mm · 7 of 33 slices shown, 9 images]
[im 5/33  brain]
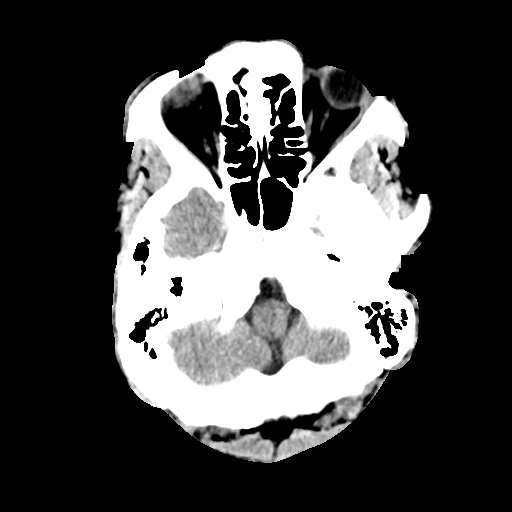
[im 5/33  bone]
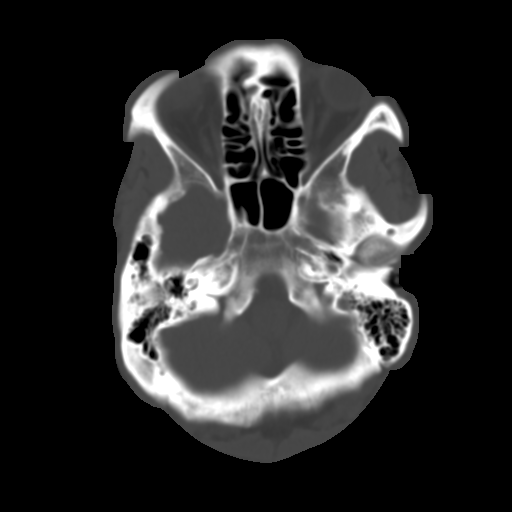
[im 9/33  brain]
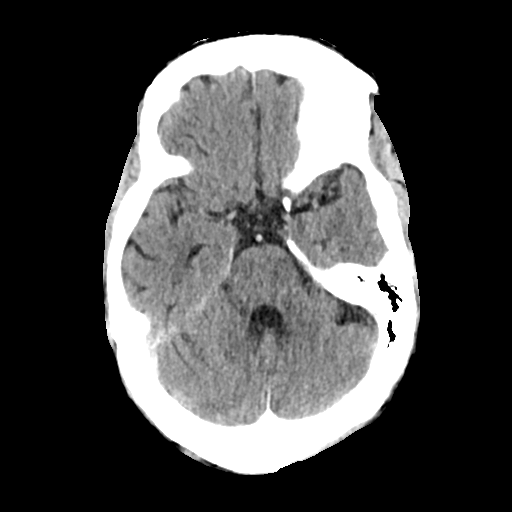
[im 13/33  brain]
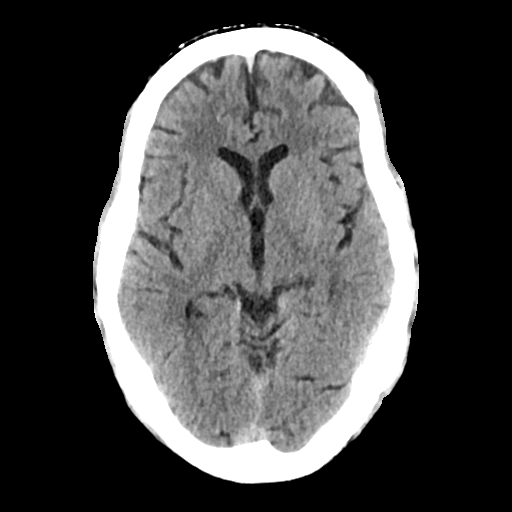
[im 17/33  brain]
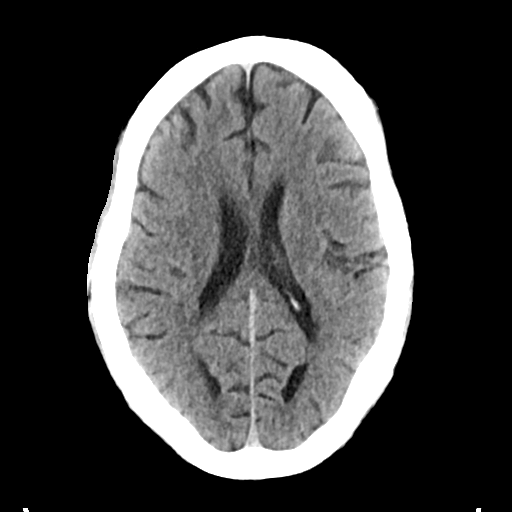
[im 21/33  brain]
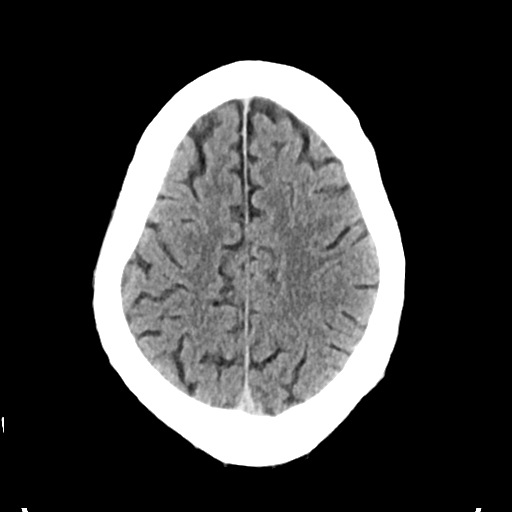
[im 21/33  bone]
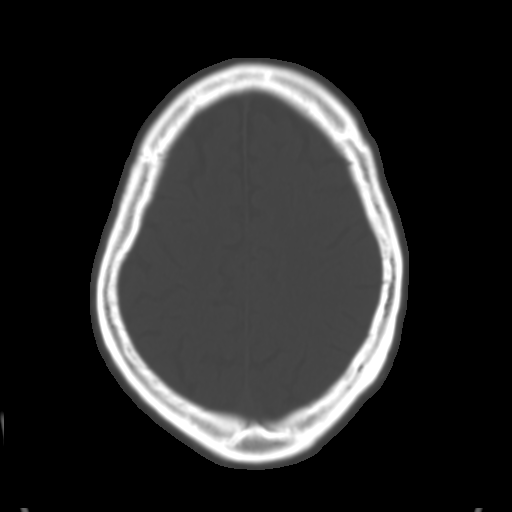
[im 25/33  brain]
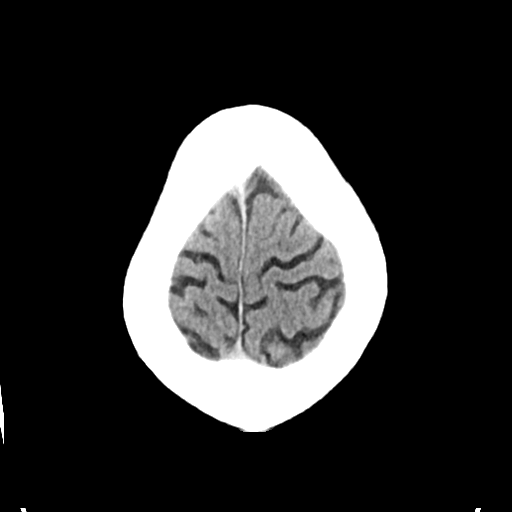
[im 29/33  brain]
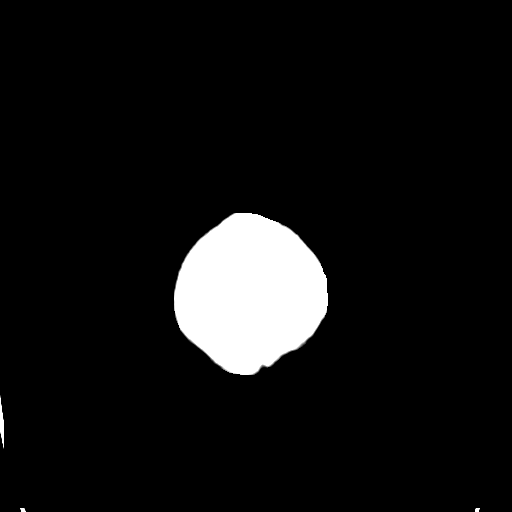

[Series 3: head bone · axial · 0.44mm/px · z∈[-176,-144]mm · 3 of 82 slices shown]
[im 9/82  bone]
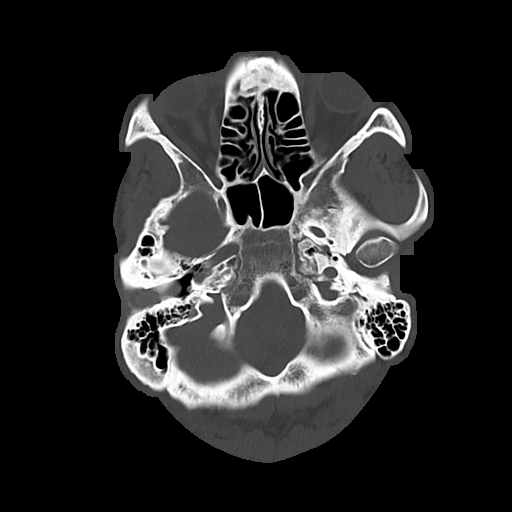
[im 17/82  bone]
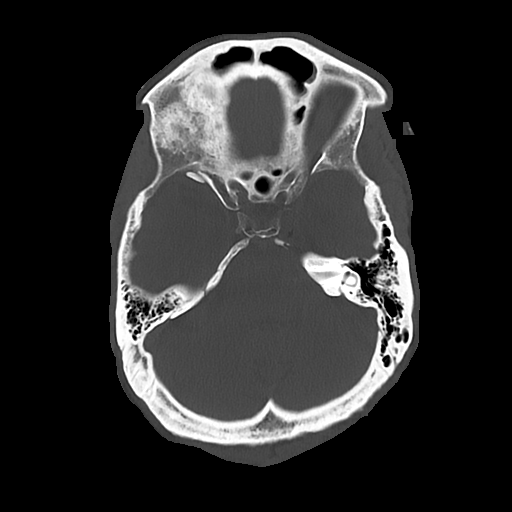
[im 25/82  bone]
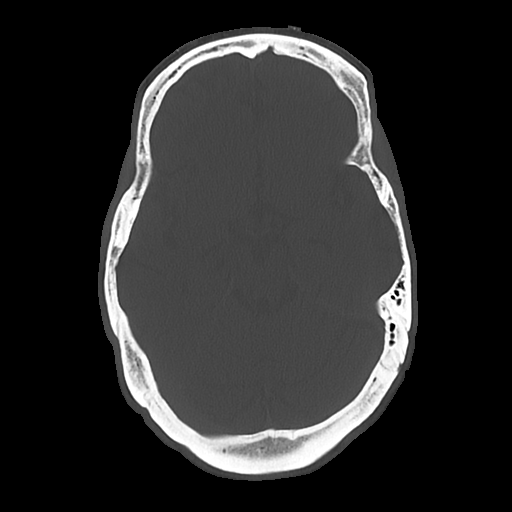

[Series 4: cor soft · coronal · 0.34mm/px · 3 of 71 slices shown]
[im 24/71  brain]
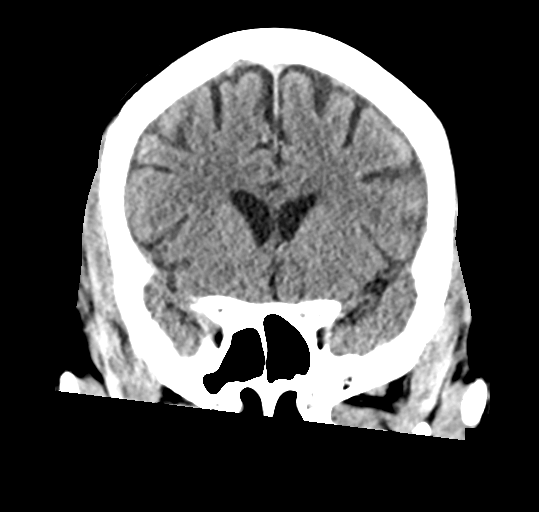
[im 32/71  brain]
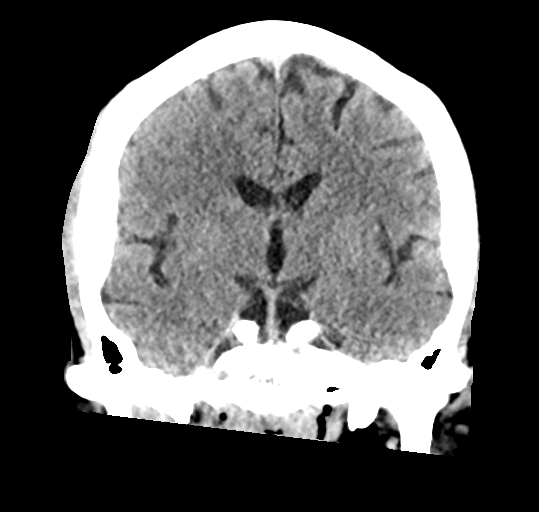
[im 39/71  brain]
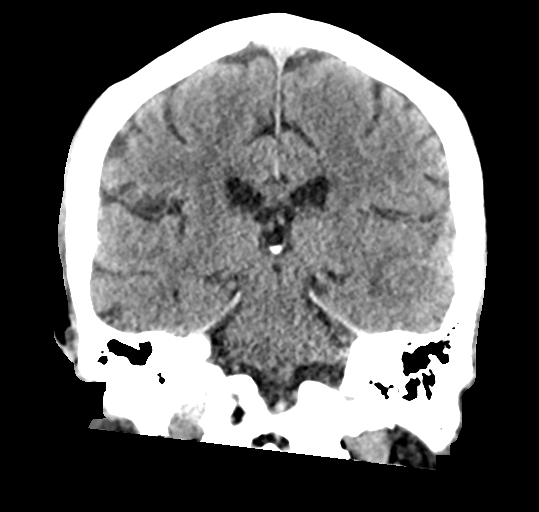

[Series 5: sag soft · sagittal · 0.34mm/px · 3 of 61 slices shown]
[im 21/61  brain]
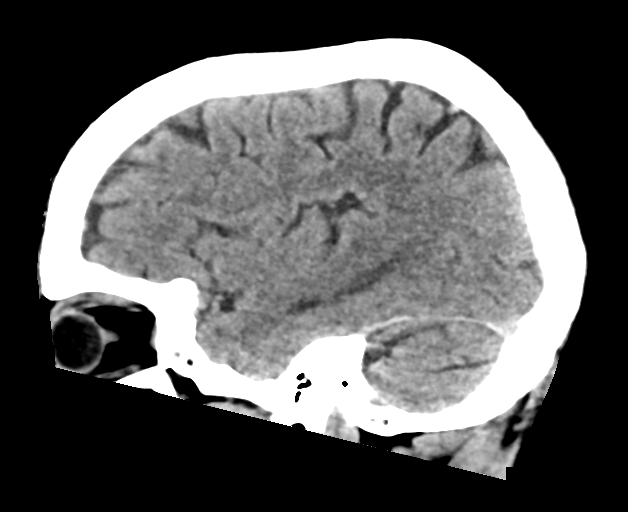
[im 31/61  brain]
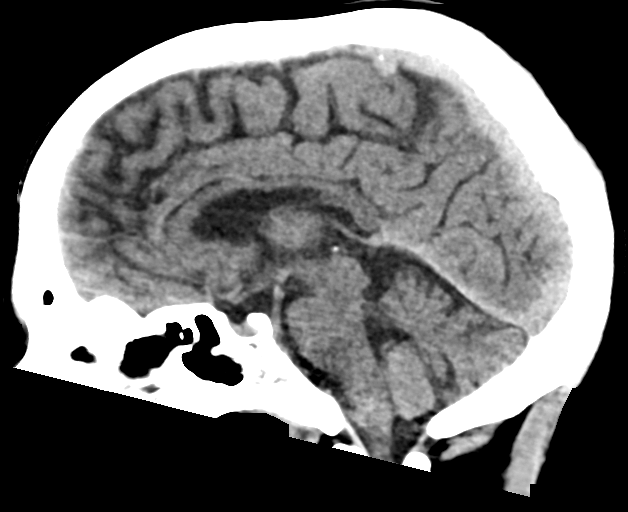
[im 40/61  brain]
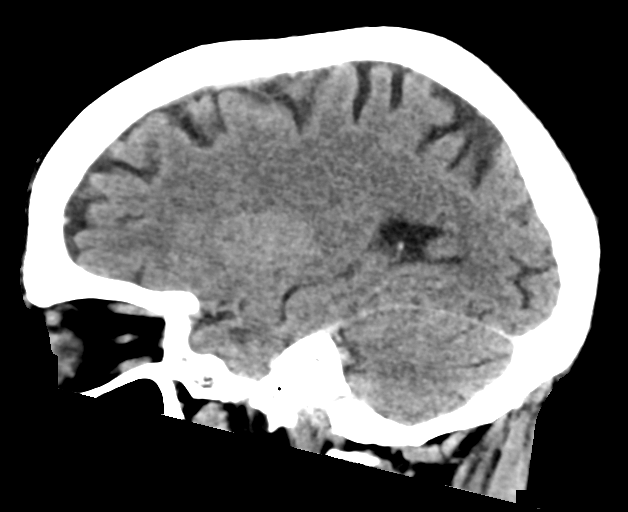

[16 of 47 positions shown; findings below may reference images not displayed]

FINDINGS: Brain: There is no mass, hemorrhage or extra-axial collection. The
size and configuration of the ventricles and extra-axial CSF spaces
are normal. The brain parenchyma is normal, without acute or chronic
infarction.

Vascular: No abnormal hyperdensity of the major intracranial
arteries or dural venous sinuses. No intracranial atherosclerosis.

Skull: The visualized skull base, calvarium and extracranial soft
tissues are normal.

Sinuses/Orbits: No fluid levels or advanced mucosal thickening of
the visualized paranasal sinuses. No mastoid or middle ear effusion.
The orbits are normal.
IMPRESSION: Normal head CT.

## 2022-02-23 IMAGING — DX DG CHEST 1V
1 series · 1 of 1 positions shown · non-contrast
Comparison: Radiograph and CT [DATE]

CLINICAL DATA: Neuropathy.

EXAM:
CHEST  1 VIEW

[chest ap]
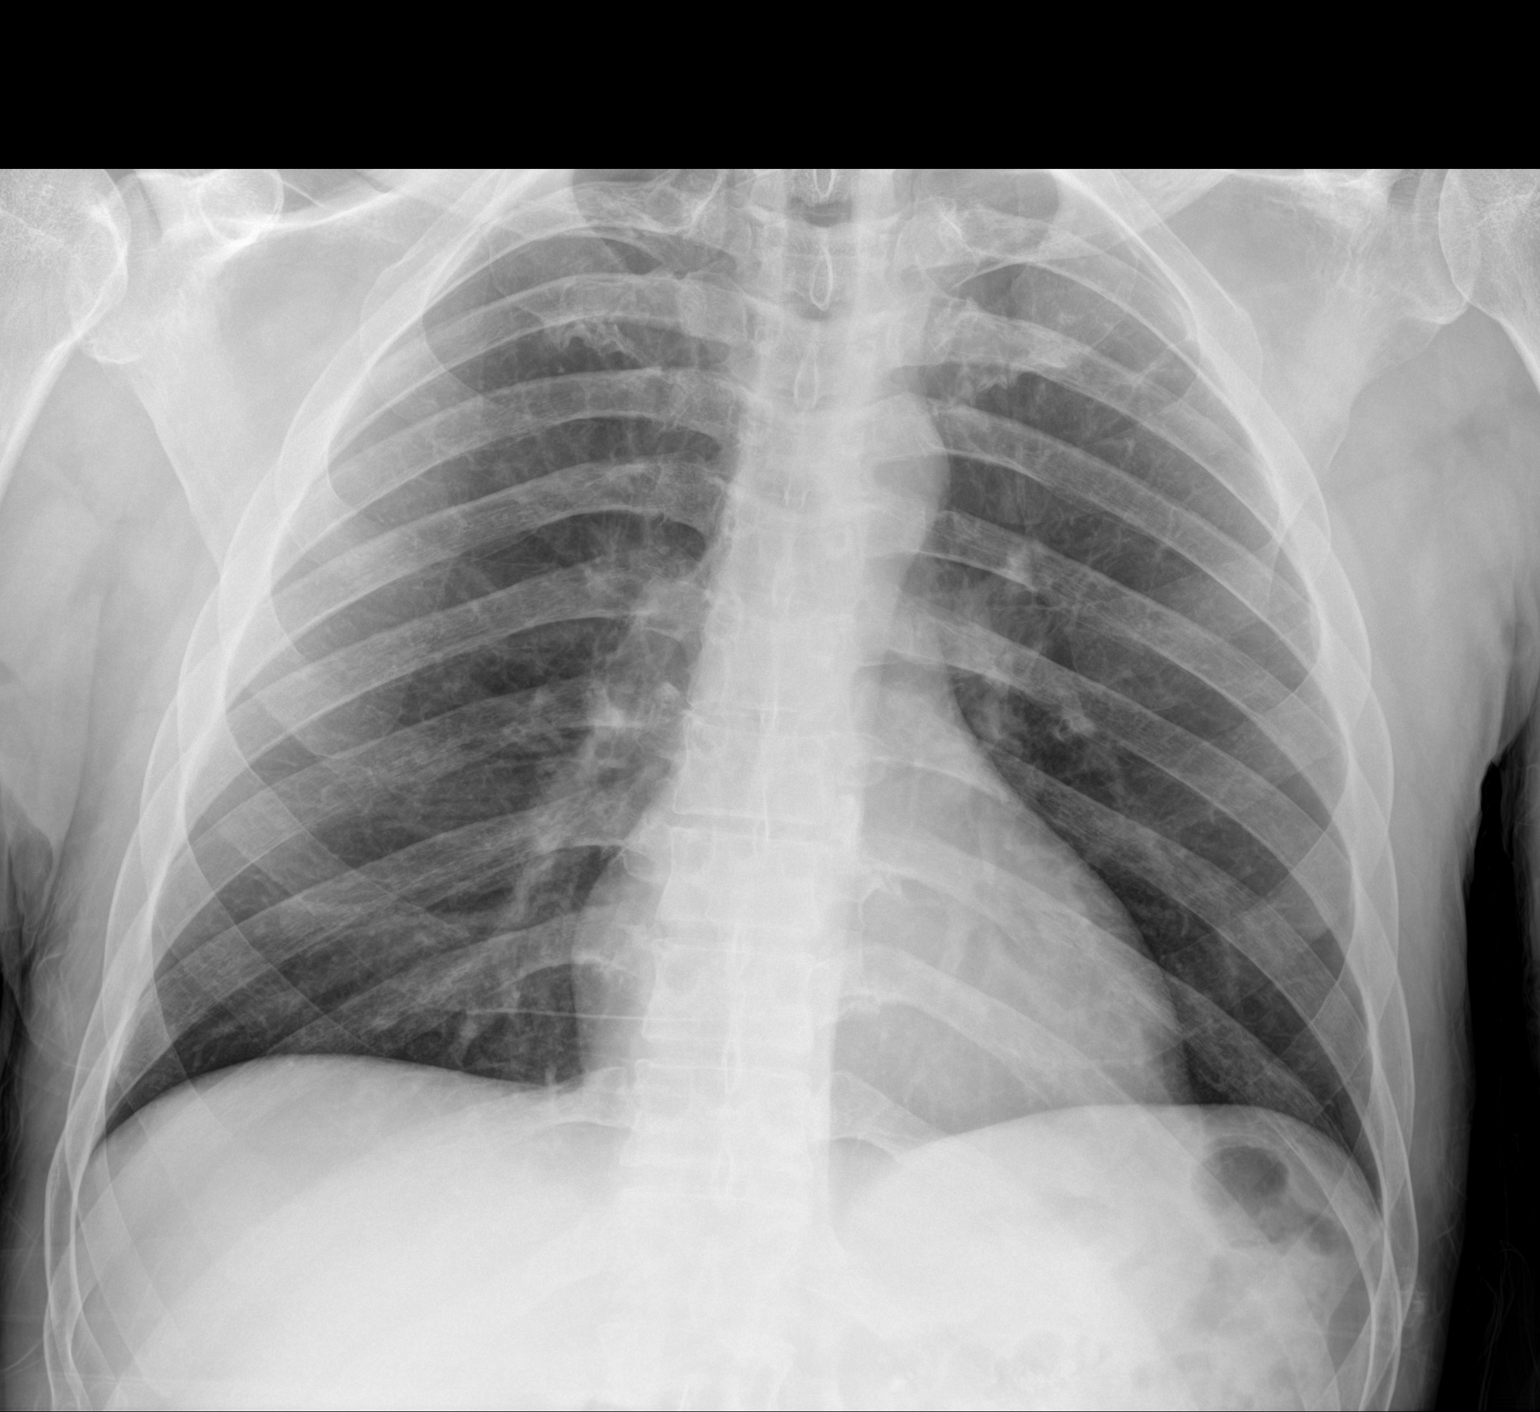

[1 of 1 positions shown; findings below may reference images not displayed]

FINDINGS: Resolved right lower lobe opacity from prior exam. The previous
cavitary left upper lobe opacity is only faintly visualized on
radiograph, appears thin walled on the current exam. No new or
progressive airspace disease. The heart is normal in size. No
pleural effusion, pulmonary edema, or pneumothorax. No acute osseous
findings.
IMPRESSION: 1. No acute findings.
2. Resolved right lower lobe opacity from prior exam.
3. The previous cavitary left upper lobe opacity is only faintly
visualized on radiograph, appears thin walled on the current exam.

## 2022-02-23 IMAGING — DX DG HAND COMPLETE 3+V*L*
3 series · 3 of 3 positions shown · non-contrast
Comparison: Middle finger radiograph [DATE]

CLINICAL DATA: Swelling, question fall.

EXAM:
LEFT HAND - COMPLETE 3+ VIEW

[hand ap]
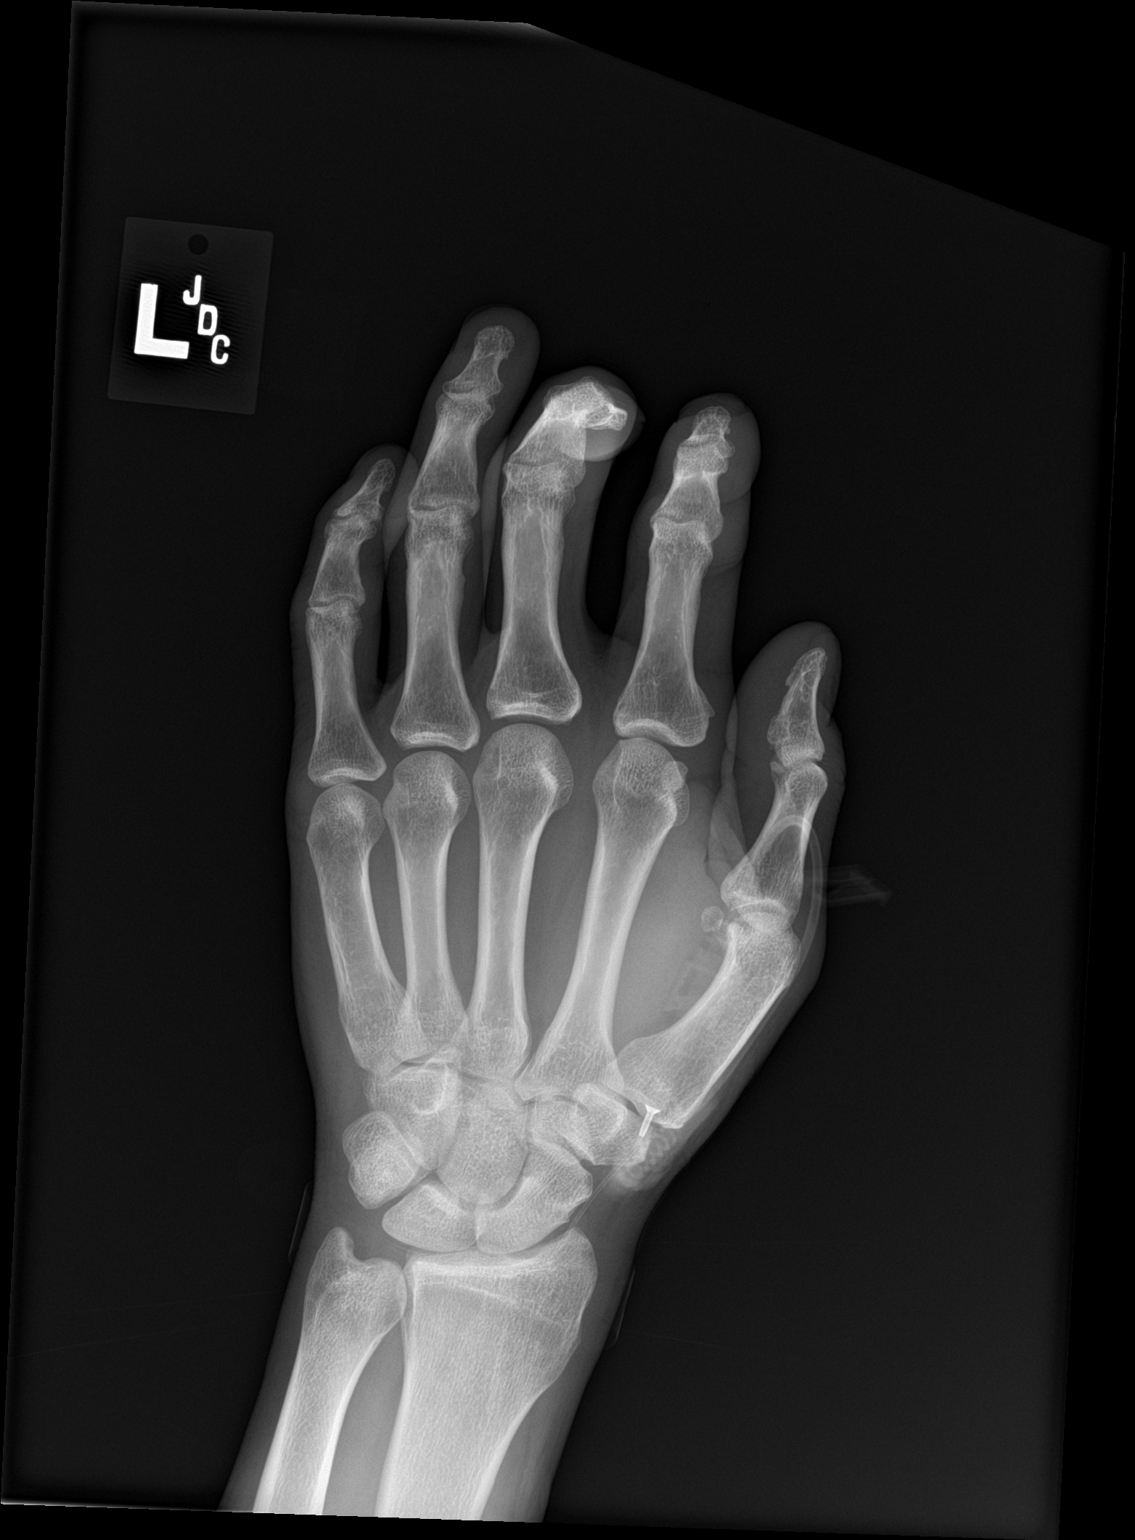

[hand obl]
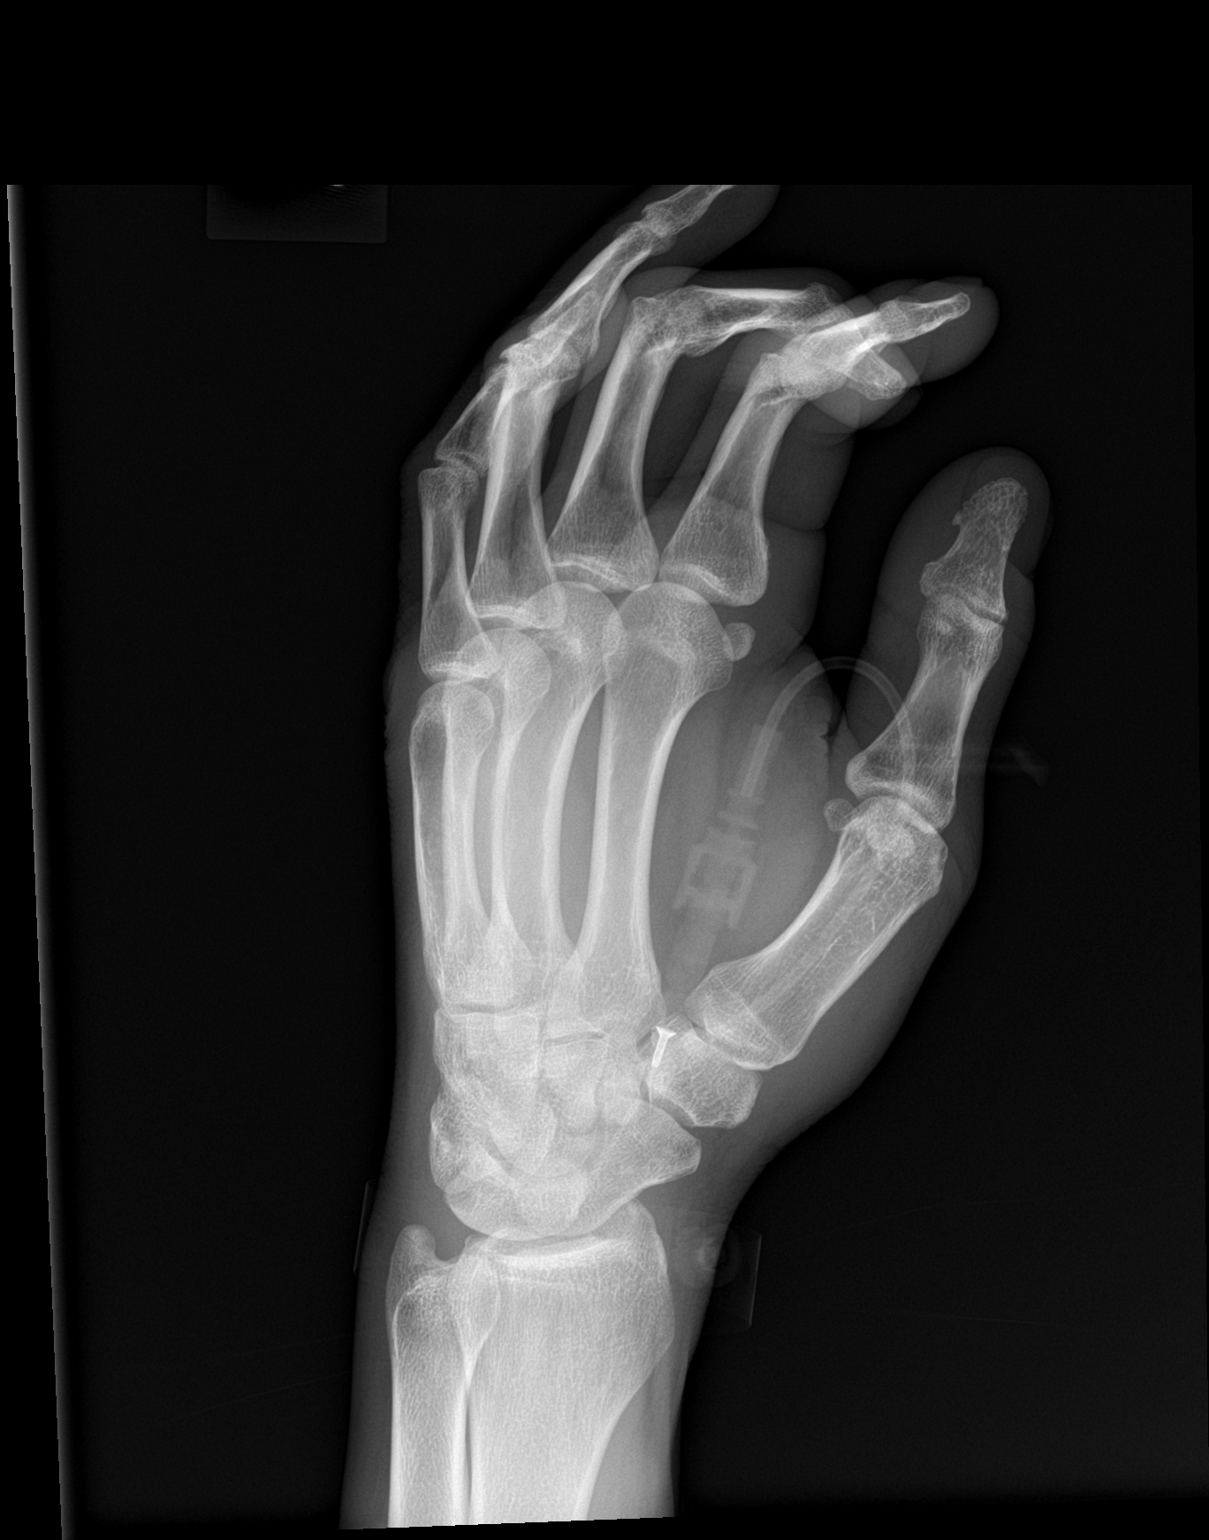

[hand lat]
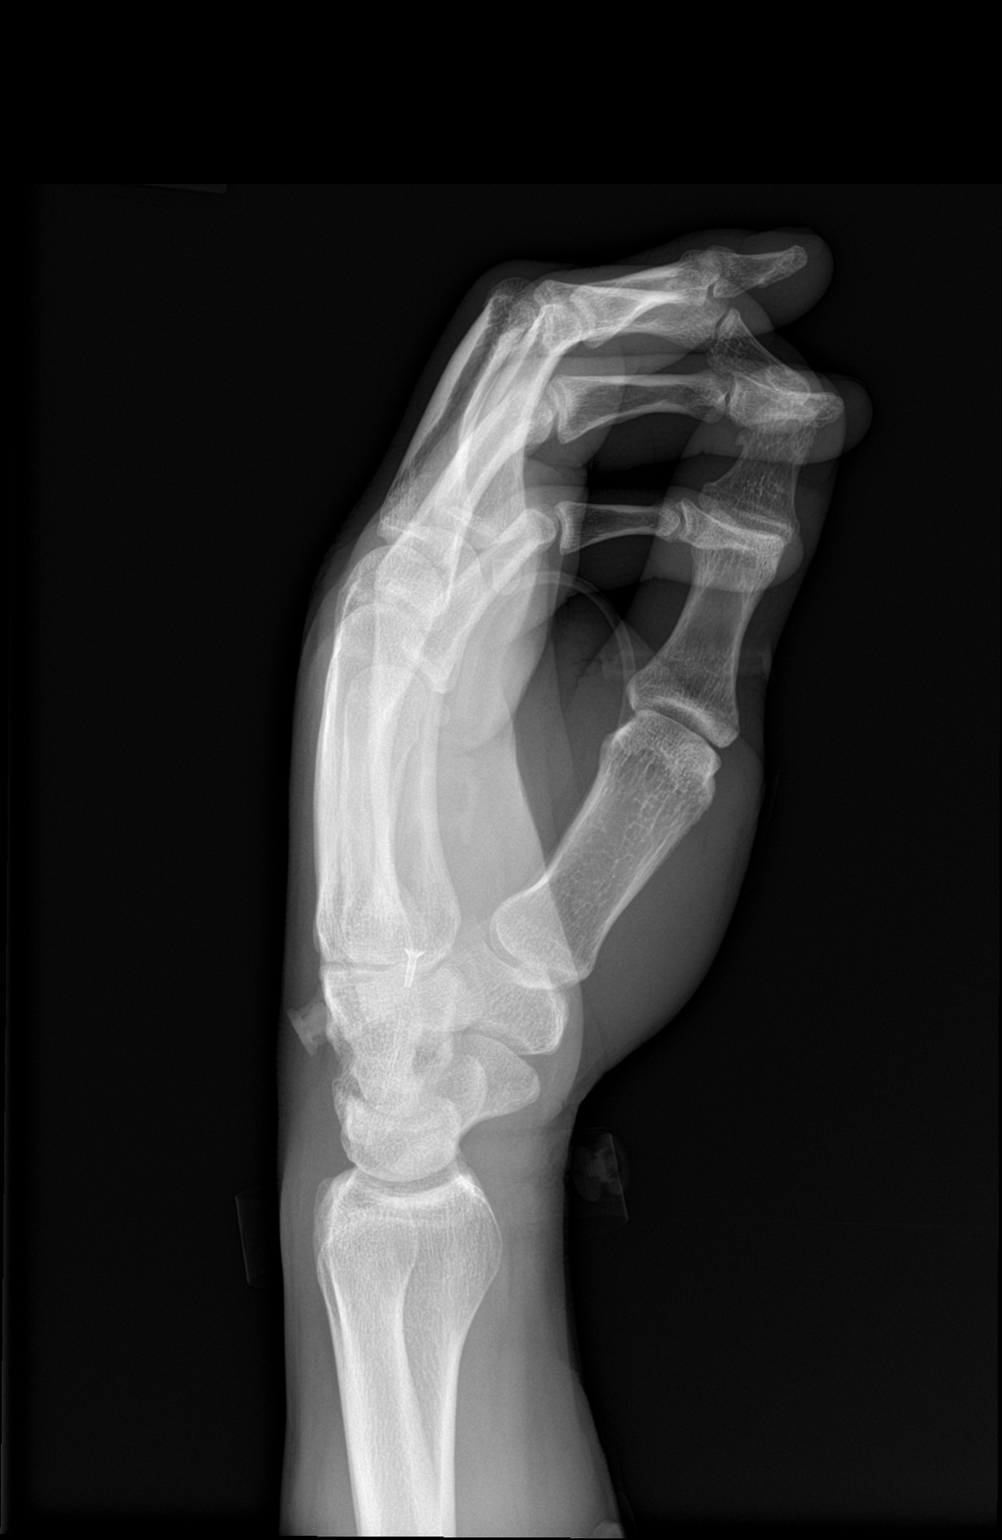

[3 of 3 positions shown; findings below may reference images not displayed]

FINDINGS: The third digit is held in flexion at the distal interphalangeal
joint on all views. No dislocation. No fracture. No significant
osteophytes. No erosions. No focal bone lesion or bone destruction.
Mild generalized soft tissue edema. No soft tissue gas.
IMPRESSION: 1. Third digit held in flexion at the distal interphalangeal joint,
chronic.
2. No acute osseous abnormality.
3. Generalized soft tissue edema.

## 2022-02-23 MED ORDER — KETOROLAC TROMETHAMINE 30 MG/ML IJ SOLN
15.0000 mg | Freq: Once | INTRAMUSCULAR | Status: AC
  Administered 2022-02-23: 15 mg via INTRAVENOUS
  Filled 2022-02-23: qty 1

## 2022-02-23 MED ORDER — LORAZEPAM 2 MG/ML IJ SOLN: 0.0000 mg | Freq: Four times a day (QID) | INTRAMUSCULAR | Status: DC

## 2022-02-23 MED ORDER — THIAMINE HCL 100 MG PO TABS
100.0000 mg | ORAL_TABLET | Freq: Once | ORAL | Status: AC
  Administered 2022-02-23: 100 mg via ORAL
  Filled 2022-02-23: qty 1

## 2022-02-23 MED ORDER — FOLIC ACID 1 MG PO TABS
1.0000 mg | ORAL_TABLET | Freq: Once | ORAL | Status: AC
  Administered 2022-02-23: 1 mg via ORAL
  Filled 2022-02-23: qty 1

## 2022-02-23 MED ORDER — SODIUM CHLORIDE 0.9 % IV BOLUS
1000.0000 mL | Freq: Once | INTRAVENOUS | Status: AC
  Administered 2022-02-23: 1000 mL via INTRAVENOUS

## 2022-02-23 MED ORDER — GABAPENTIN 300 MG PO CAPS
300.0000 mg | ORAL_CAPSULE | Freq: Once | ORAL | Status: AC
  Administered 2022-02-23: 300 mg via ORAL
  Filled 2022-02-23: qty 1

## 2022-02-23 NOTE — Discharge Instructions (Signed)
Drink alcohol only in moderation.  Return to the ER for worsening symptoms, persistent vomiting, difficulty breathing or other concerns. 

## 2022-02-23 NOTE — ED Provider Notes (Signed)
Patient now alert, oriented, sitting up on his own.  Requesting a meal tray which we have provided him.  Appears well, speech is clear thought process is clear.  Does not show evidence of alcohol intoxication.  Sits up without difficulty.  Pleasant clear speech.  Reports he will get weird symptoms sometimes like he did last night but got better now.  Reports this happened a couple times in the past seems to after he has a couple of drinks  Vitals:   02/23/22 0900 02/23/22 0930  BP: (!) 138/93   Pulse: 73 72  Resp: 20   Temp:    SpO2: 94% 93%     Patient currently resting comfortably without distress.  He is clinically sober.  Appropriate for discharge.  He is not driving himself today  Return precautions and treatment recommendations and follow-up discussed with the patient who is agreeable with the plan.    Delman Kitten, MD 02/23/22 1008

## 2022-02-23 NOTE — ED Notes (Signed)
Pt still getting dressed to leave.

## 2022-02-23 NOTE — ED Provider Notes (Signed)
[facility] Provider Note    Event Date/Time   First MD Initiated Contact with Patient 02/23/22 0003     (approximate)   History   Peripheral Neuropathy   HPI  Lance Green is a 46 y.o. male brought to the ED via EMS from home with a chief complaint of pain in all 4 extremities from peripheral neuropathy.  Cannot afford prescribed medication for neuropathic pain. +EtOH use tonight.  Incontinent of urine.  Denies recent fever, cough, chest pain, shortness of breath, abdominal pain, vomiting or diarrhea.     Past Medical History   Past Medical History:  Diagnosis Date   Alcohol abuse    HTN (hypertension)    Neuropathy    Pancreatitis    Peptic ulcer disease      Active Problem List   Patient Active Problem List   Diagnosis Date Noted   Cavitary pneumonia 02/20/2021   Sepsis (HCC) 02/20/2021   Normocytic anemia 02/20/2021   HTN (hypertension)    Seizure (HCC) 11/21/2019   Alcohol abuse 11/21/2019   Hypomagnesemia 11/21/2019   Abscess of left middle finger 11/10/2018   Acute appendicitis with localized peritonitis 07/06/2018     Past Surgical History   Past Surgical History:  Procedure Laterality Date   APPENDECTOMY     INCISION AND DRAINAGE Left 11/11/2018   Procedure: INCISION AND DRAINAGE;  Surgeon: Donato Heinz, MD;  Location: [facility] ORS;  Service: Orthopedics;  Laterality: Left;   LAPAROSCOPIC APPENDECTOMY N/A 07/06/2018   Procedure: APPENDECTOMY LAPAROSCOPIC;  Surgeon: Carolan Shiver, MD;  Location: [facility] ORS;  Service: General;  Laterality: N/A;     Home Medications   Prior to Admission medications   Medication Sig Start Date End Date Taking? Authorizing Provider  amLODipine (NORVASC) 2.5 MG tablet Take 1 tablet (2.5 mg total) by mouth daily. Patient not taking: Reported on 02/20/2021 11/29/19   Lynn Ito, MD  FERROUS SULFATE PO Take 1 tablet by mouth daily. Patient not taking: Reported on 02/23/2022    [provider]  folic acid (FOLVITE) 1 MG tablet Take 1 tablet (1 mg total) by mouth daily. Patient not taking: Reported on 02/20/2021 11/29/19   Lynn Ito, MD  hydrOXYzine (ATARAX) 25 MG tablet Take 1 tablet (25 mg total) by mouth every 6 (six) hours as needed for itching. Patient not taking: Reported on 01/26/2022 10/11/21   Tommi Rumps, PA-C  levETIRAcetam (KEPPRA) 250 MG tablet Take 1 tablet (250 mg total) by mouth 2 (two) times daily. Patient not taking: Reported on 02/20/2021 11/28/19   Lynn Ito, MD  levETIRAcetam (KEPPRA) 250 MG tablet Take 1 tablet (250 mg total) by mouth 2 (two) times daily. 09/04/20 11/03/20  Merwyn Katos, MD  Multiple Vitamin (MULTIVITAMIN WITH MINERALS) TABS tablet Take 1 tablet by mouth daily. Patient not taking: Reported on 02/20/2021 11/29/19   Lynn Ito, MD  thiamine 100 MG tablet Take 1 tablet (100 mg total) by mouth daily. Patient not taking: Reported on 02/20/2021 11/28/19   Lynn Ito, MD  triamcinolone ointment (KENALOG) 0.5 % Apply 1 application topically 2 (two) times daily. Patient not taking: Reported on 01/26/2022 10/11/21   Tommi Rumps, PA-C     Allergies  Patient has no known allergies.   Family History   Family History  Problem Relation Age of Onset   Cataracts Mother      Physical Exam  Triage Vital Signs: ED Triage Vitals  Enc Vitals Group  BP 02/22/22 2350 120/87     Pulse Rate 02/22/22 2350 82     Resp 02/22/22 2350 16     Temp 02/22/22 2350 97.8 F (36.6 C)     Temp src --      SpO2 02/22/22 2348 95 %     Weight 02/22/22 2353 147 lb 7.8 oz (66.9 kg)     Height 02/22/22 2353 5\' 8"  (1.727 m)     Head Circumference --      Peak Flow --      Pain Score --      Pain Loc --      Pain Edu? --      Excl. in GC? --     Updated Vital Signs: BP 128/85   Pulse 74   Temp 98.3 F (36.8 C) (Oral)   Resp 20   Ht 5\' 8"  (1.727 m)   Wt 66.9 kg   SpO2 92%   BMI 22.43 kg/m    General: Awake, no distress.  +EtOH. CV:  RRR good peripheral perfusion.  Resp:  Normal effort.  CTA B. Abd:  Nontender.  No distention.  Other:  Head is atraumatic.  Nose is atraumatic.  No dental malocclusion.  C-spine nontender, no step-offs or deformities noted.  Left hand swollen at thenar eminence.   ED Results / Procedures / Treatments  Labs (all labs ordered are listed, but only abnormal results are displayed) Labs Reviewed  BASIC METABOLIC PANEL - Abnormal; Notable for the following components:      Result Value   CO2 19 (*)    Calcium 8.8 (*)    All other components within normal limits  ETHANOL - Abnormal; Notable for the following components:   Alcohol, Ethyl (B) 343 (*)    All other components within normal limits  CBC  URIC ACID  TROPONIN I (HIGH SENSITIVITY)     EKG  ED ECG REPORT I, Shanine Kreiger J, the attending physician, personally viewed and interpreted this ECG.   Date: 02/23/2022  EKG Time: 2353  Rate: 77  Rhythm: normal sinus rhythm  Axis: Normal  Intervals:none  ST&T Change: Nonspecific    RADIOLOGY I have independently visualized and interpreted patient's CT, chest and hand x-rays as well as noted the radiology interpretation: New  CT head: No ICH  Chest x-ray: No acute cardiopulmonary process; improved from prior  Left hand x-ray: Soft tissue edema without osseous abnormality  Official radiology report(s): DG Chest 1 View  Result Date: 02/23/2022 CLINICAL DATA:  Neuropathy. EXAM: CHEST  1 VIEW COMPARISON:  Radiograph and CT 03/31/2021 FINDINGS: Resolved right lower lobe opacity from prior exam. The previous cavitary left upper lobe opacity is only faintly visualized on radiograph, appears thin walled on the current exam. No new or progressive airspace disease. The heart is normal in size. No pleural effusion, pulmonary edema, or pneumothorax. No acute osseous findings. IMPRESSION: 1. No acute findings. 2. Resolved right lower lobe opacity from prior exam. 3. The previous  cavitary left upper lobe opacity is only faintly visualized on radiograph, appears thin walled on the current exam. Electronically Signed   By: 02/25/2022 M.D.   On: 02/23/2022 01:21   CT Head Wo Contrast  Result Date: 02/23/2022 CLINICAL DATA:  Altered mental status EXAM: CT HEAD WITHOUT CONTRAST TECHNIQUE: Contiguous axial images were obtained from the base of the skull through the vertex without intravenous contrast. RADIATION DOSE REDUCTION: This exam was performed according to the departmental dose-optimization program which includes automated  exposure control, adjustment of the mA and/or kV according to patient size and/or use of iterative reconstruction technique. COMPARISON:  None Available. FINDINGS: Brain: There is no mass, hemorrhage or extra-axial collection. The size and configuration of the ventricles and extra-axial CSF spaces are normal. The brain parenchyma is normal, without acute or chronic infarction. Vascular: No abnormal hyperdensity of the major intracranial arteries or dural venous sinuses. No intracranial atherosclerosis. Skull: The visualized skull base, calvarium and extracranial soft tissues are normal. Sinuses/Orbits: No fluid levels or advanced mucosal thickening of the visualized paranasal sinuses. No mastoid or middle ear effusion. The orbits are normal. IMPRESSION: Normal head CT. Electronically Signed   By: Deatra Robinson M.D.   On: 02/23/2022 01:29   DG Hand Complete Left  Result Date: 02/23/2022 CLINICAL DATA:  Swelling, question fall. EXAM: LEFT HAND - COMPLETE 3+ VIEW COMPARISON:  Middle finger radiograph 11/10/2018 FINDINGS: The third digit is held in flexion at the distal interphalangeal joint on all views. No dislocation. No fracture. No significant osteophytes. No erosions. No focal bone lesion or bone destruction. Mild generalized soft tissue edema. No soft tissue gas. IMPRESSION: 1. Third digit held in flexion at the distal interphalangeal joint, chronic. 2.  No acute osseous abnormality. 3. Generalized soft tissue edema. Electronically Signed   By: Narda Rutherford M.D.   On: 02/23/2022 01:19     PROCEDURES:  Critical Care performed: Yes, see critical care procedure note(s)  CRITICAL CARE Performed by: Irean Hong   Total critical care time: 30 minutes  Critical care time was exclusive of separately billable procedures and treating other patients.  Critical care was necessary to treat or prevent imminent or life-threatening deterioration.  Critical care was time spent personally by me on the following activities: development of treatment plan with patient and/or surrogate as well as nursing, discussions with consultants, evaluation of patient's response to treatment, examination of patient, obtaining history from patient or surrogate, ordering and performing treatments and interventions, ordering and review of laboratory studies, ordering and review of radiographic studies, pulse oximetry and re-evaluation of patient's condition.   Marland Kitchen1-3 Lead EKG Interpretation Performed by: Irean Hong, MD Authorized by: Irean Hong, MD     Interpretation: normal     ECG rate:  80   ECG rate assessment: normal     Rhythm: sinus rhythm     Ectopy: none     Conduction: normal   Comments:     Placed on cardiac monitor to evaluate for arrhythmias   MEDICATIONS ORDERED IN ED: Medications  LORazepam (ATIVAN) injection 0-4 mg (0 mg Intravenous Not Given 02/23/22 0655)  sodium chloride 0.9 % bolus 1,000 mL (1,000 mLs Intravenous New Bag/Given 02/23/22 0145)  ketorolac (TORADOL) 30 MG/ML injection 15 mg (15 mg Intravenous Given 02/23/22 0122)  gabapentin (NEURONTIN) capsule 300 mg (300 mg Oral Given 02/23/22 0122)  thiamine tablet 100 mg (100 mg Oral Given 02/23/22 0123)  folic acid (FOLVITE) tablet 1 mg (1 mg Oral Given 02/23/22 0123)     IMPRESSION / MDM / ASSESSMENT AND PLAN / ED COURSE  I reviewed the triage vital signs and the nursing notes.                              46 year old male presenting with pain in all 4 extremities from peripheral neuropathy.  I have identified a potentially life-threatening condition in this patient.  Additionally, left hand is swollen; patient is intoxicated  and does not know if he fell or not.  Differential diagnosis includes but is not limited to neuropathic pain, musculoskeletal pain, autoimmune, infectious, metabolic etiologies, etc.  I have personally reviewed patient's records and see that he had an ED visit for the same on 01/25/2022.  The patient is on the cardiac monitor to evaluate for evidence of arrhythmia and/or significant heart rate changes.  We will obtain lab work, x-ray left hand and CT head given that patient does not know if he fell.  Initiate IV fluid resuscitation, placed on CIWA scale, Toradol for pain.  Will reassess.  Clinical Course as of 02/23/22 0724  Sun Feb 23, 2022  0330 EtOH 343.  Otherwise laboratory results unremarkable with normal WBC 6.1, normal electrolytes, negative uric acid and troponin.  CT head negative for ICH.  Chest is unremarkable and improved from prior.  Left hand x-rays demonstrate soft tissue edema without osseous abnormality.  IV fluids infusing.  Will monitor until sobriety. [JS]  0424 Patient sleeping soundly; will continue to monitor. [JS]  [id number] Patient remains asleep.  Care transferred to Dr. Fanny BienQuale at change of shift.  Anticipate may patient may be able to be discharged once he is sober and ambulatory with steady gait. [JS]    Clinical Course User Index [JS] Irean HongSung, Tamim Skog J, MD     FINAL CLINICAL IMPRESSION(S) / ED DIAGNOSES   Final diagnoses:  Neuropathy  Alcoholic intoxication without complication (HCC)     Rx / DC Orders   ED Discharge Orders     None        Note:  This document was prepared using Dragon voice recognition software and may include unintentional dictation errors.   Irean HongSung, Nyelle Wolfson J, MD 02/23/22 [id number]

## 2022-02-23 NOTE — ED Notes (Signed)
Pt getting dressed to discharge. Provided pants and socks to pt.

## 2022-02-23 NOTE — ED Notes (Signed)
Pt awake now, sitting up in bed, EDP spoke with pt at bedside and provided [place] sandwich tray to pt.

## 2022-03-12 ENCOUNTER — Ambulatory Visit: Payer: Medicaid Other | Admitting: Internal Medicine

## 2022-03-24 ENCOUNTER — Emergency Department
Admission: EM | Admit: 2022-03-24 | Discharge: 2022-03-25 | Disposition: A | Discharge status: Home / Self Care | Payer: Medicaid Other | Attending: Emergency Medicine | Admitting: Emergency Medicine

## 2022-03-24 ENCOUNTER — Encounter: Payer: Self-pay | Admitting: *Deleted

## 2022-03-24 ENCOUNTER — Other Ambulatory Visit: Payer: Self-pay

## 2022-03-24 DIAGNOSIS — M542 Cervicalgia: Secondary | ICD-10-CM | POA: Insufficient documentation

## 2022-03-24 DIAGNOSIS — I10 Essential (primary) hypertension: Secondary | ICD-10-CM | POA: Insufficient documentation

## 2022-03-24 DIAGNOSIS — W19XXXA Unspecified fall, initial encounter: Secondary | ICD-10-CM | POA: Insufficient documentation

## 2022-03-24 DIAGNOSIS — M545 Low back pain, unspecified: Secondary | ICD-10-CM | POA: Insufficient documentation

## 2022-03-24 NOTE — ED Triage Notes (Addendum)
Pt brought in via ems from the streets.  Pt is homeless.  Pt reports recent falls.  Pt has lower back pain.  Pt also reports neck pain.  Ems report pt fell yesterday and 3 days ago.    Pt alert   speech clear. Fsbs 112 per ems.

## 2022-03-25 ENCOUNTER — Emergency Department: Payer: Medicaid Other

## 2022-03-25 LAB — COMPREHENSIVE METABOLIC PANEL
ALT: 57 U/L — ABNORMAL HIGH (ref 0–44)
AST: 106 U/L — ABNORMAL HIGH (ref 15–41)
Albumin: 3.6 g/dL (ref 3.5–5.0)
Alkaline Phosphatase: 125 U/L (ref 38–126)
Anion gap: 11 (ref 5–15)
BUN: 7 mg/dL (ref 6–20)
CO2: 22 mmol/L (ref 22–32)
Calcium: 9.1 mg/dL (ref 8.9–10.3)
Chloride: 105 mmol/L (ref 98–111)
Creatinine, Ser: 0.5 mg/dL — ABNORMAL LOW (ref 0.61–1.24)
GFR, Estimated: >60 mL/min (ref >60)
Glucose, Bld: 94 mg/dL (ref 70–99)
Potassium: 4 mmol/L (ref 3.5–5.1)
Sodium: 138 mmol/L (ref 135–145)
Total Bilirubin: 0.8 mg/dL (ref 0.3–1.2)
Total Protein: 7.3 g/dL (ref 6.5–8.1)

## 2022-03-25 LAB — CBC WITH DIFFERENTIAL/PLATELET
Abs Immature Granulocytes: 0.02 10*3/uL (ref 0.00–0.07)
Basophils Absolute: 0 10*3/uL (ref 0.0–0.1)
Basophils Relative: 0 %
Eosinophils Absolute: 0.1 10*3/uL (ref 0.0–0.5)
Eosinophils Relative: 1 %
HCT: 36.4 % — ABNORMAL LOW (ref 39.0–52.0)
Hemoglobin: 12.6 g/dL — ABNORMAL LOW (ref 13.0–17.0)
Immature Granulocytes: 0 %
Lymphocytes Relative: 32 %
Lymphs Abs: 1.8 10*3/uL (ref 0.7–4.0)
MCH: 32.4 pg (ref 26.0–34.0)
MCHC: 34.6 g/dL (ref 30.0–36.0)
MCV: 93.6 fL (ref 80.0–100.0)
Monocytes Absolute: 0.6 10*3/uL (ref 0.1–1.0)
Monocytes Relative: 11 %
Neutro Abs: 3.1 10*3/uL (ref 1.7–7.7)
Neutrophils Relative %: 56 %
Platelets: 126 10*3/uL — ABNORMAL LOW (ref 150–400)
RBC: 3.89 MIL/uL — ABNORMAL LOW (ref 4.22–5.81)
RDW: 12.1 % (ref 11.5–15.5)
WBC: 5.7 10*3/uL (ref 4.0–10.5)
nRBC: 0 % (ref 0.0–0.2)

## 2022-03-25 LAB — URINALYSIS, COMPLETE (UACMP) WITH MICROSCOPIC
Bacteria, UA: NONE SEEN
Bilirubin Urine: NEGATIVE
Glucose, UA: NEGATIVE mg/dL
Ketones, ur: NEGATIVE mg/dL
Leukocytes,Ua: NEGATIVE
Nitrite: NEGATIVE
Protein, ur: 30 mg/dL — AB
Specific Gravity, Urine: 1.009 (ref 1.005–1.030)
pH: 5 (ref 5.0–8.0)

## 2022-03-25 LAB — CK
Total CK: 2261 U/L — ABNORMAL HIGH (ref 49–397)
Total CK: 1879 U/L — ABNORMAL HIGH (ref 49–397)

## 2022-03-25 IMAGING — CR DG CERVICAL SPINE COMPLETE 4+V
7 series · 7 of 7 positions shown · non-contrast
Comparison: CT cervical spine [DATE]

CLINICAL DATA: Neck pain after a fall yesterday.

EXAM:
CERVICAL SPINE - COMPLETE 4+ VIEW

[c-spine lat]
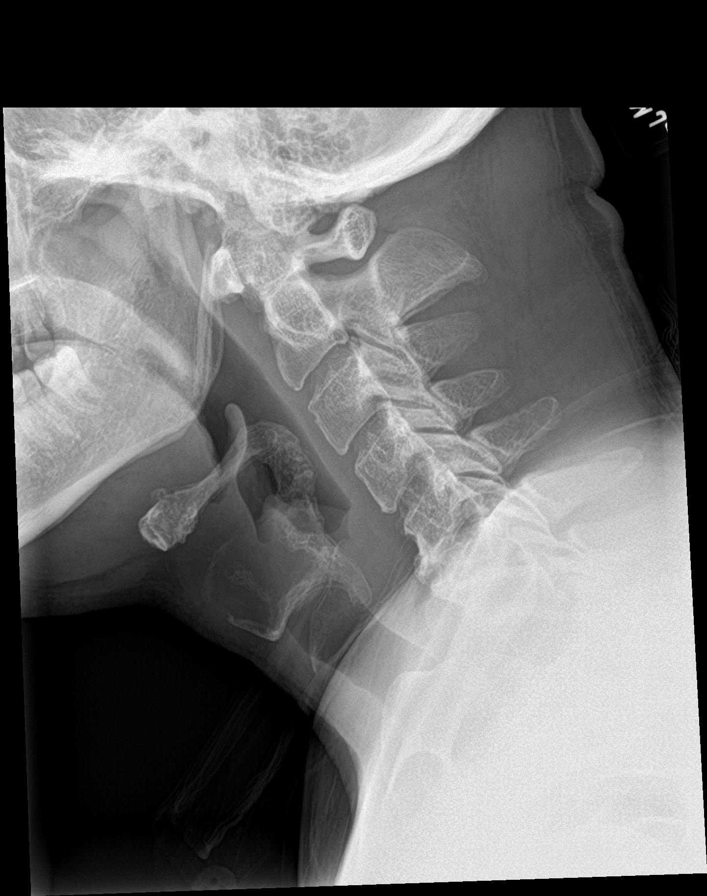

[c-spine obl (1 of 2)]
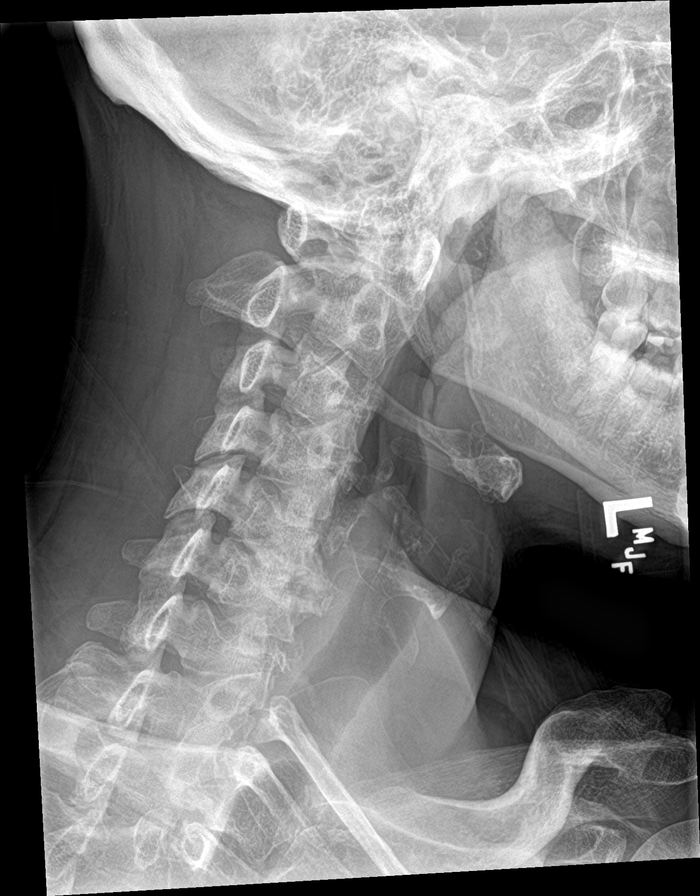

[c-spine obl (2 of 2)]
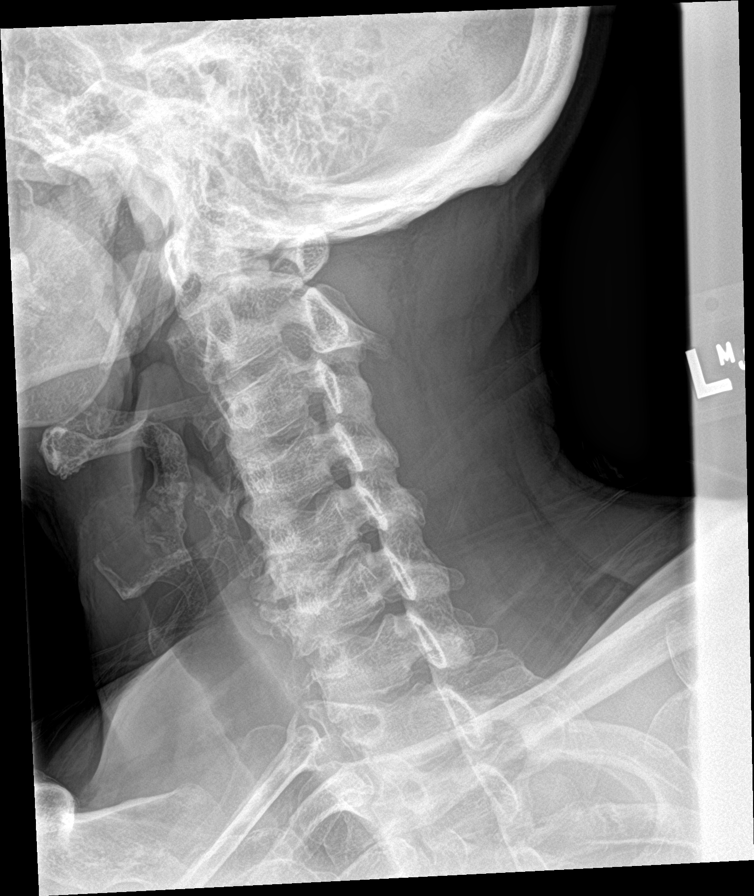

[c-spine ap]
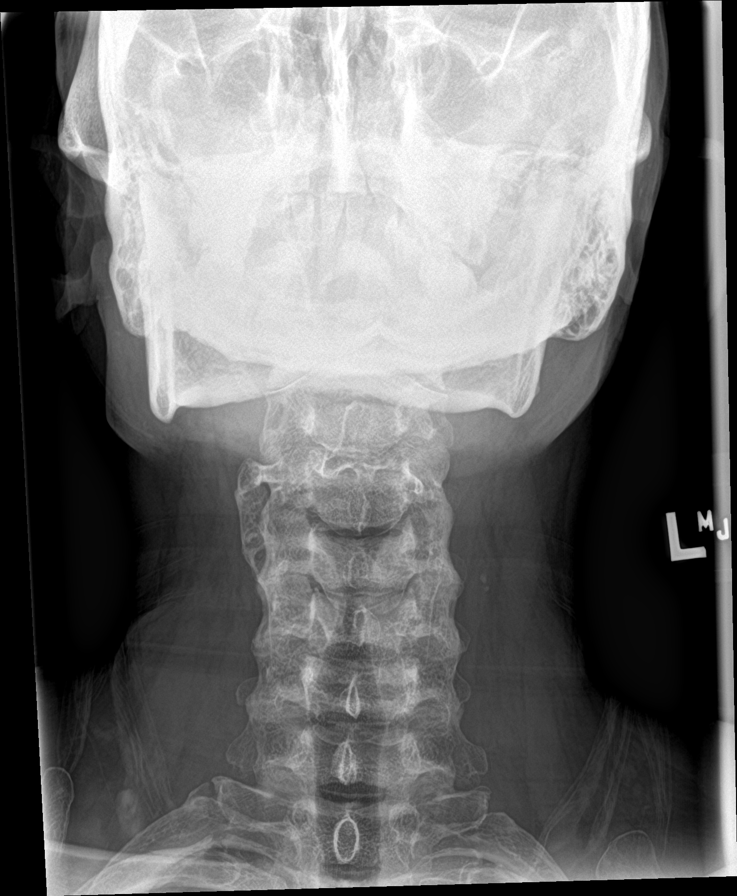

[c-spine open mouth]
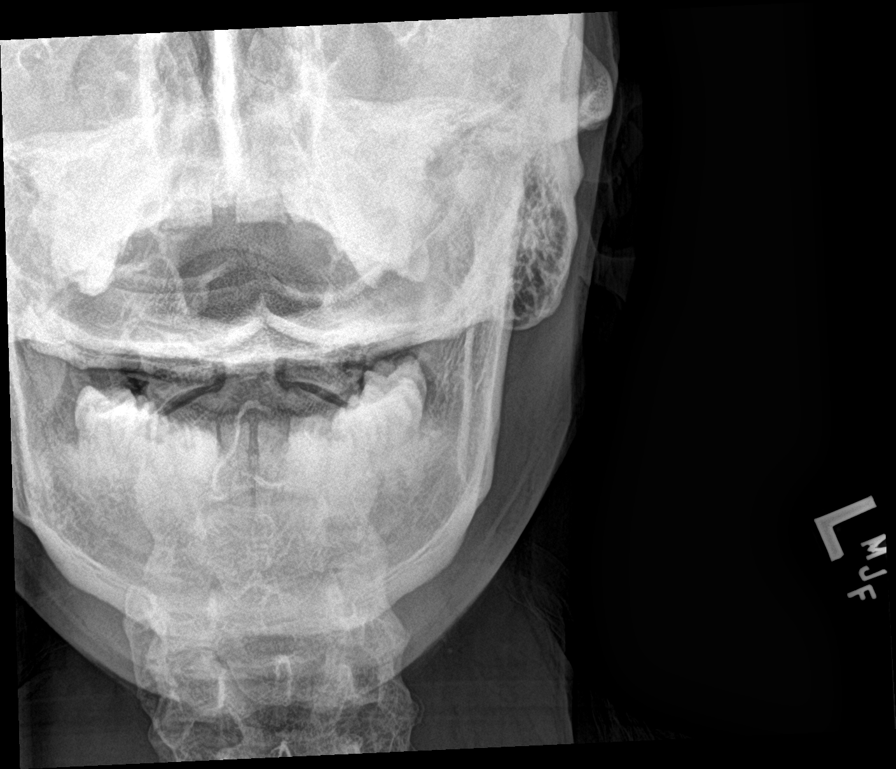

[[person_name]]
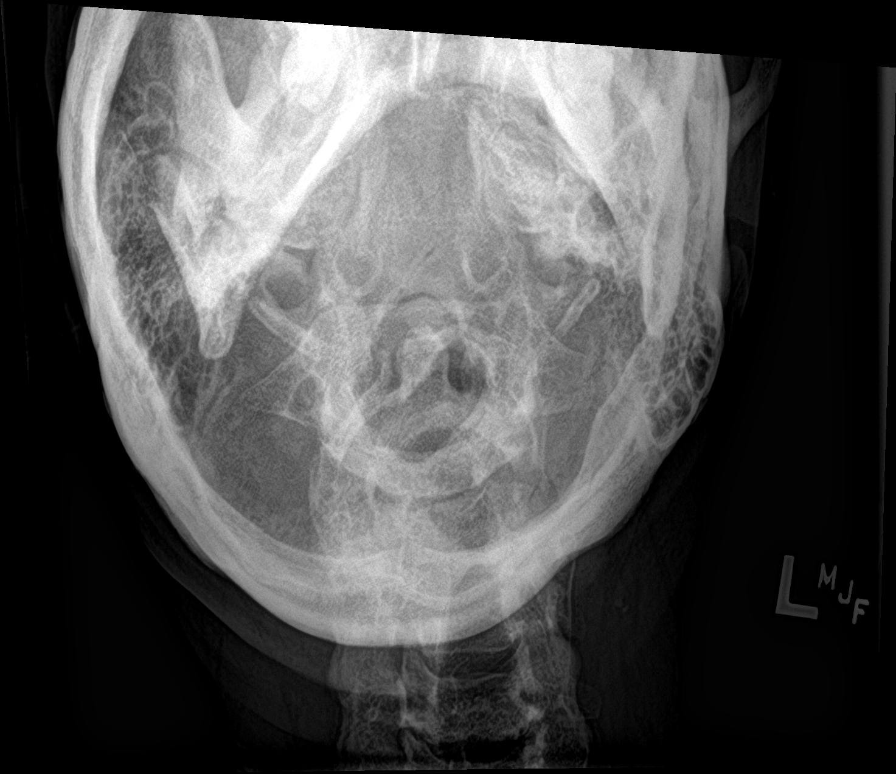

[c-spine swimmers]
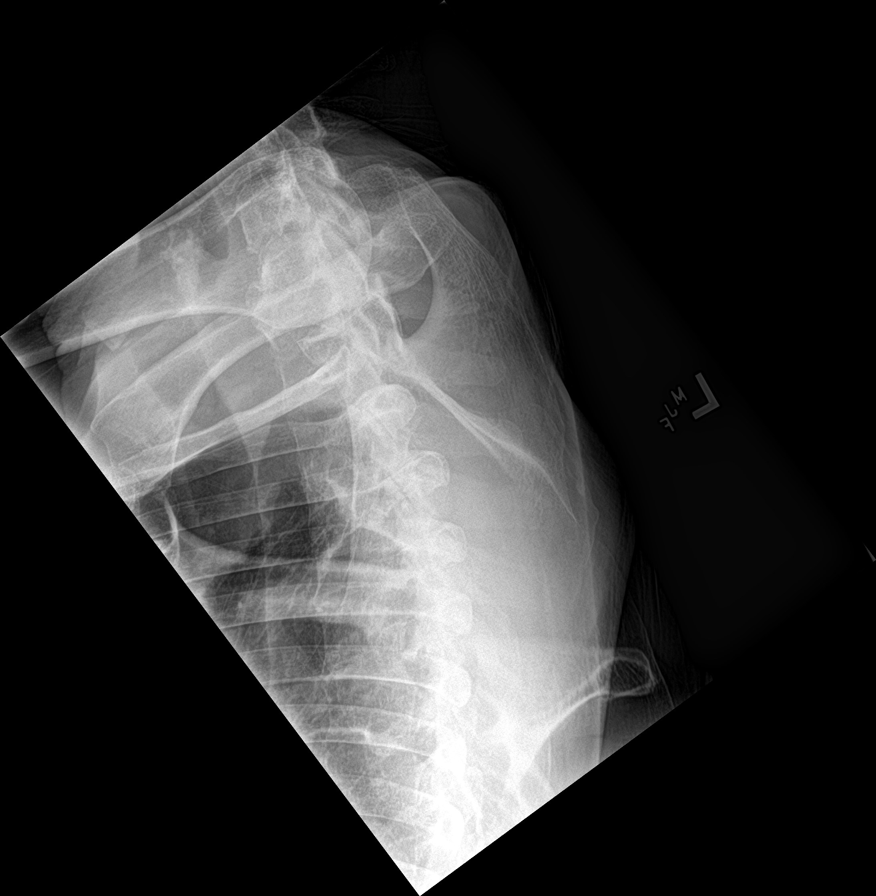

[7 of 7 positions shown; findings below may reference images not displayed]

FINDINGS: Straightening of usual cervical lordosis without anterior
subluxation. This is unchanged since prior study. This is likely
positional but could indicate muscle spasm. No vertebral compression
deformities. Prominent degenerative changes with disc space
narrowing and endplate osteophyte formation at C5-6 without change.
Normal alignment of the posterior elements. No bone encroachment
upon the neural foramina. C1-2 articulation appears intact.
IMPRESSION: Nonspecific straightening of usual cervical lordosis without change.
Prominent degenerative changes at C5-6 also without change. No acute
displaced fractures identified.

## 2022-03-25 IMAGING — CR DG LUMBAR SPINE COMPLETE 4+V
5 series · 5 of 5 positions shown · non-contrast
Comparison: CT abdomen and pelvis [DATE]

CLINICAL DATA: Low back pain after a fall. Fell yesterday and 3
days ago.

EXAM:
LUMBAR SPINE - COMPLETE 4+ VIEW

[l-spine ap]
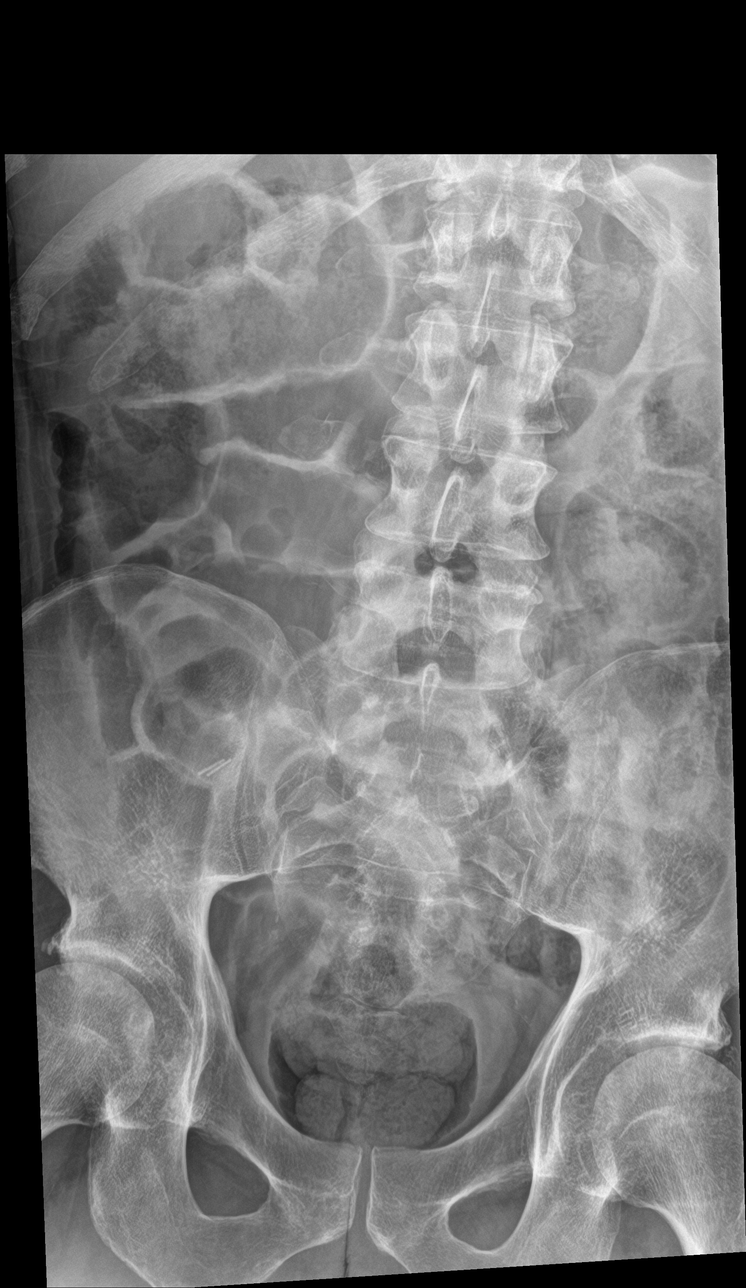

[l-spine obl (1 of 2)]
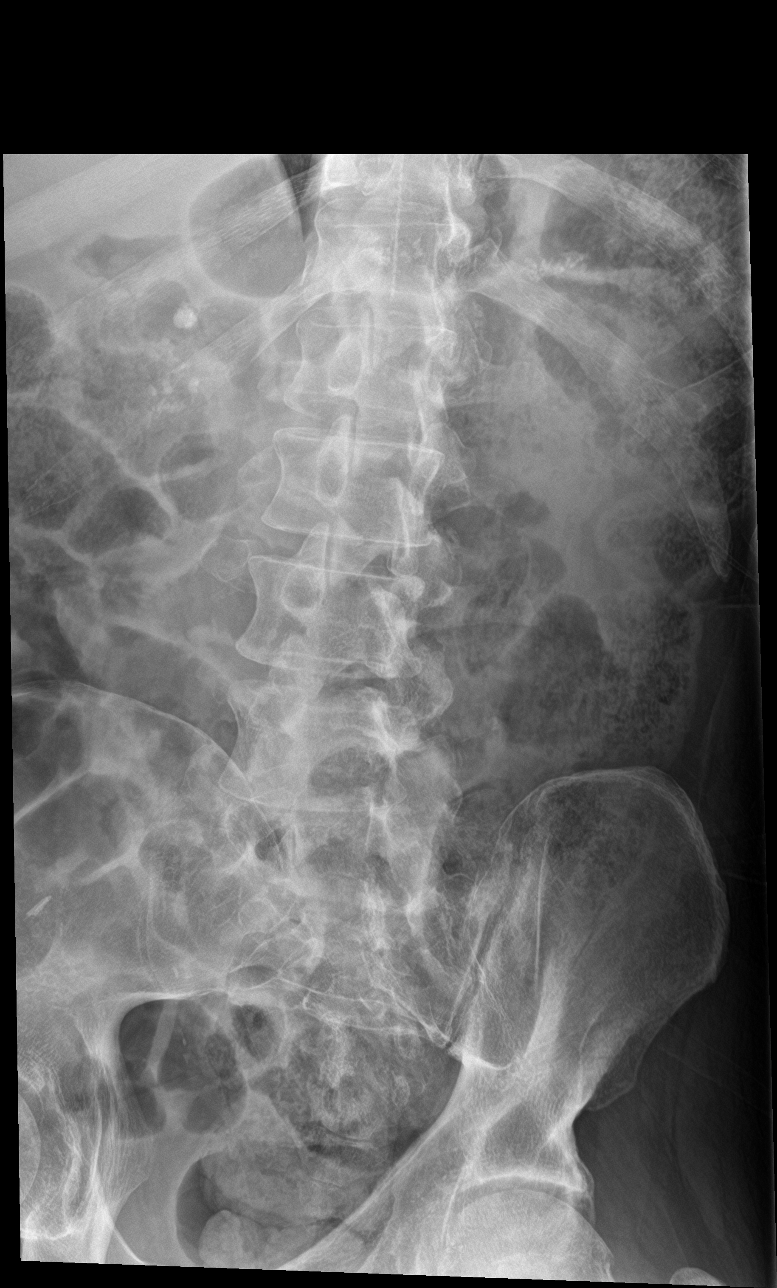

[l-spine obl (2 of 2)]
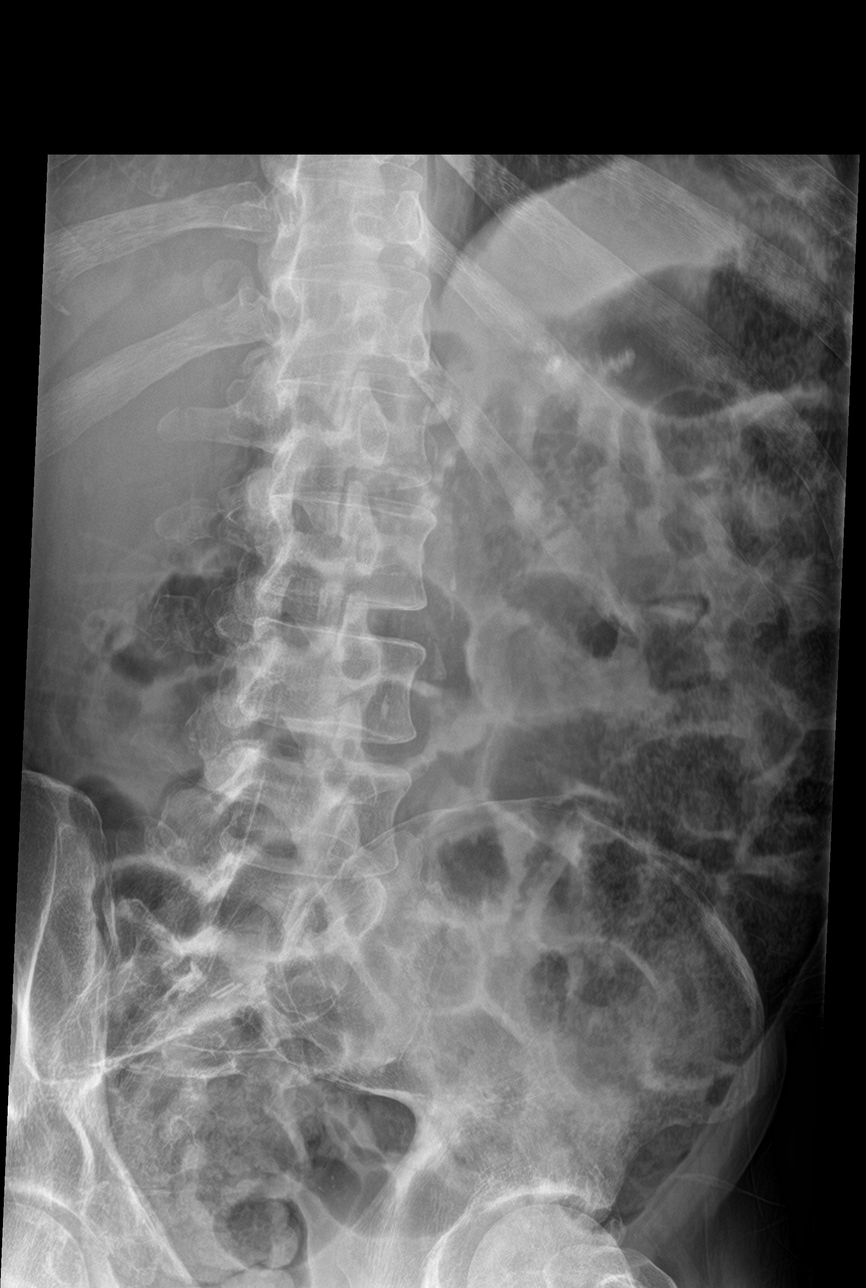

[l-spine lat]
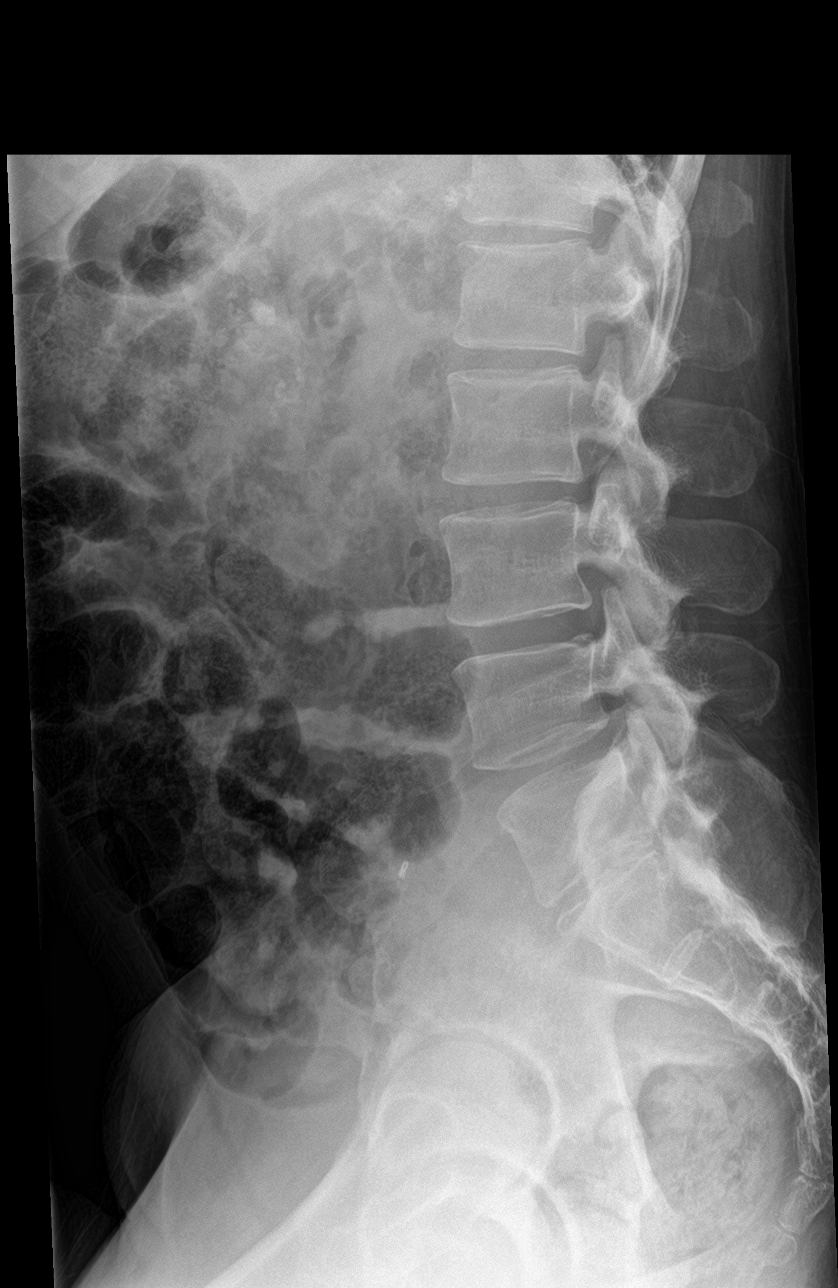

[l-spine spot]
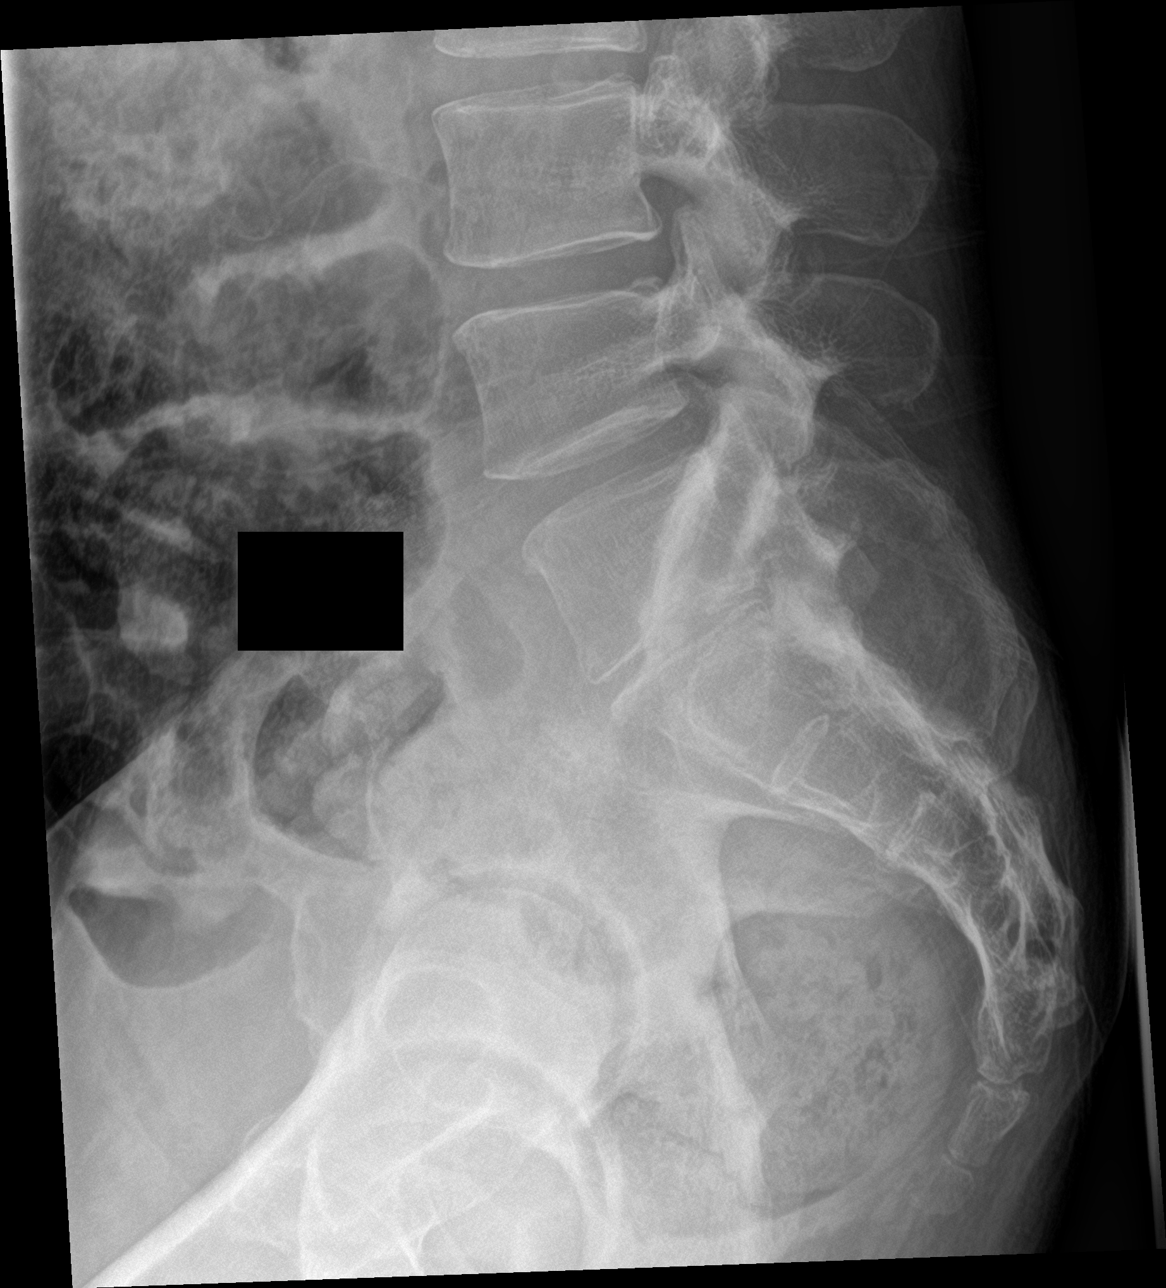

[5 of 5 positions shown; findings below may reference images not displayed]

FINDINGS: Sacralization of L5. Normal alignment of the lumbar vertebrae. No
vertebral compression deformities. No focal bone lesion or bone
destruction. Bone cortex appears intact. Visualized sacrum appears
intact.
IMPRESSION: Normal alignment.  No acute displaced fractures.

## 2022-03-25 MED ORDER — ACETAMINOPHEN 500 MG PO TABS
1000.0000 mg | ORAL_TABLET | Freq: Once | ORAL | Status: AC
  Administered 2022-03-25: 1000 mg via ORAL
  Filled 2022-03-25: qty 2

## 2022-03-25 MED ORDER — LACTATED RINGERS IV BOLUS
1000.0000 mL | Freq: Once | INTRAVENOUS | Status: AC
  Administered 2022-03-25: 1000 mL via INTRAVENOUS

## 2022-03-25 MED ORDER — IBUPROFEN 800 MG PO TABS
800.0000 mg | ORAL_TABLET | Freq: Once | ORAL | Status: AC
  Administered 2022-03-25: 800 mg via ORAL
  Filled 2022-03-25: qty 1

## 2022-03-25 NOTE — Discharge Instructions (Signed)

## 2022-03-25 NOTE — ED Provider Notes (Signed)
[facility] Provider Note    Event Date/Time   First MD Initiated Contact with Patient 03/24/22 2353     (approximate)   History   Fall   HPI  Lance Green is a 46 y.o. male with a history of homelessness, neuropathy, alcohol abuse, hypertension who presents for evaluation of falls.  Patient reports that because of his neuropathy he has frequent falls.  He fell 3 days ago and hurt his back.  He also felt last night and hurt his neck.  He reports that the fall last night he was unable to get up and lay at the park for about 3 hours until somebody was able to help him.  He denies saddle anesthesia, lower extremity weakness or numbness, urinary or bowel incontinence or retention.  He is complaining of generalized neck pain and generalized lumbar pain.     Past Medical History:  Diagnosis Date   Alcohol abuse    HTN (hypertension)    Neuropathy    Pancreatitis    Peptic ulcer disease     Past Surgical History:  Procedure Laterality Date   APPENDECTOMY     INCISION AND DRAINAGE Left 11/11/2018   Procedure: INCISION AND DRAINAGE;  Surgeon: Donato Heinz, MD;  Location: [facility] ORS;  Service: Orthopedics;  Laterality: Left;   LAPAROSCOPIC APPENDECTOMY N/A 07/06/2018   Procedure: APPENDECTOMY LAPAROSCOPIC;  Surgeon: Carolan Shiver, MD;  Location: [facility] ORS;  Service: General;  Laterality: N/A;     Physical Exam   Triage Vital Signs: ED Triage Vitals  Enc Vitals Group     BP 03/24/22 2043 (!) 148/99     Pulse Rate 03/24/22 2043 83     Resp 03/24/22 2043 18     Temp 03/24/22 2043 99.2 F (37.3 C)     Temp Source 03/24/22 2043 Oral     SpO2 03/24/22 2043 98 %     Weight 03/24/22 2041 150 lb (68 kg)     Height 03/24/22 2041 5\' 8"  (1.727 m)     Head Circumference --      Peak Flow --      Pain Score 03/24/22 2041 7     Pain Loc --      Pain Edu? --      Excl. in GC? --     Most recent vital signs: Vitals:   03/25/22 0040 03/25/22 0354   BP: (!) 157/98 (!) 161/101  Pulse: 83 70  Resp: 16 18  Temp:    SpO2: 99% 100%    Full spinal precautions maintained throughout the trauma exam. Constitutional: Alert and oriented. No acute distress. Does not appear intoxicated. HEENT Head: Normocephalic and atraumatic. Face: No facial bony tenderness. Stable midface Ears: No hemotympanum bilaterally. No Battle sign Eyes: No eye injury. PERRL. No raccoon eyes Nose: Nontender. No epistaxis. No rhinorrhea Mouth/Throat: Mucous membranes are moist. No oropharyngeal blood. No dental injury. Airway patent without stridor. Normal voice. Neck: no C-collar. No midline c-spine tenderness.  Cardiovascular: Normal rate, regular rhythm. Normal and symmetric distal pulses are present in all extremities. Pulmonary/Chest: Chest wall is stable and nontender to palpation/compression. Normal respiratory effort. Breath sounds are normal. No crepitus.  Abdominal: Soft, nontender, non distended. Musculoskeletal: Nontender with normal full range of motion in all extremities. No deformities. No thoracic or lumbar midline spinal tenderness. Pelvis is stable. Skin: Skin is warm, dry and intact. No abrasions or contutions. Psychiatric: Speech and behavior are appropriate. Neurological: Normal speech and language.  Moves all extremities to command. No gross focal neurologic deficits are appreciated.  Glascow Coma Score: 4 - Opens eyes on own 6 - Follows simple motor commands 5 - Alert and oriented GCS: 15   ED Results / Procedures / Treatments   Labs (all labs ordered are listed, but only abnormal results are displayed) Labs Reviewed  CBC WITH DIFFERENTIAL/PLATELET - Abnormal; Notable for the following components:      Result Value   RBC 3.89 (*)    Hemoglobin 12.6 (*)    HCT 36.4 (*)    Platelets 126 (*)    All other components within normal limits  COMPREHENSIVE METABOLIC PANEL - Abnormal; Notable for the following components:   Creatinine, Ser  0.50 (*)    AST 106 (*)    ALT 57 (*)    All other components within normal limits  CK - Abnormal; Notable for the following components:   Total CK 2,261 (*)    All other components within normal limits  URINALYSIS, COMPLETE (UACMP) WITH MICROSCOPIC - Abnormal; Notable for the following components:   Color, Urine YELLOW (*)    APPearance CLEAR (*)    Hgb urine dipstick SMALL (*)    Protein, ur 30 (*)    All other components within normal limits  CK - Abnormal; Notable for the following components:   Total CK 1,879 (*)    All other components within normal limits     EKG  ED ECG REPORT I, Nita Sickle, the attending physician, personally viewed and interpreted this ECG.  Sinus rhythm with a rate of 67, normal intervals, normal axis, no ST elevations or depressions.  RADIOLOGY I, Nita Sickle, attending MD, have personally viewed and interpreted the images obtained during this visit as below:  X-rays negative for acute traumatic injury   ___________________________________________________ Interpretation by Radiologist:  DG Cervical Spine Complete  Result Date: 03/25/2022 CLINICAL DATA:  Neck pain after a fall yesterday. EXAM: CERVICAL SPINE - COMPLETE 4+ VIEW COMPARISON:  CT cervical spine 03/31/2021 FINDINGS: Straightening of usual cervical lordosis without anterior subluxation. This is unchanged since prior study. This is likely positional but could indicate muscle spasm. No vertebral compression deformities. Prominent degenerative changes with disc space narrowing and endplate osteophyte formation at C5-6 without change. Normal alignment of the posterior elements. No bone encroachment upon the neural foramina. C1-2 articulation appears intact. IMPRESSION: Nonspecific straightening of usual cervical lordosis without change. Prominent degenerative changes at C5-6 also without change. No acute displaced fractures identified. Electronically Signed   By: Burman Nieves  M.D.   On: 03/25/2022 00:48   DG Lumbar Spine Complete  Result Date: 03/25/2022 CLINICAL DATA:  Low back pain after a fall. Fell yesterday and 3 days ago. EXAM: LUMBAR SPINE - COMPLETE 4+ VIEW COMPARISON:  CT abdomen and pelvis 07/06/2018 FINDINGS: Sacralization of L5. Normal alignment of the lumbar vertebrae. No vertebral compression deformities. No focal bone lesion or bone destruction. Bone cortex appears intact. Visualized sacrum appears intact. IMPRESSION: Normal alignment.  No acute displaced fractures. Electronically Signed   By: Burman Nieves M.D.   On: 03/25/2022 00:46      PROCEDURES:  Critical Care performed: No  Procedures    IMPRESSION / MDM / ASSESSMENT AND PLAN / ED COURSE  I reviewed the triage vital signs and the nursing notes.  46 y.o. male with a history of homelessness, neuropathy, alcohol abuse, hypertension who presents for evaluation of falls.  Patient is well-appearing in no distress, no signs of  trauma on exam, exam is otherwise nonfocal.  We will get a cervical and lumbar x-ray.  We will check basic labs for any other possible etiologies for patient's falls other than alcoholic neuropathy.  Will check CBC for anemia or sepsis, will check a metabolic panel for any signs of dehydration, electrolyte derangements, or AKI.  We will get an EKG to rule out dysrhythmias.  We will give him a Tylenol for pain.    MEDICATIONS GIVEN IN ED: Medications  acetaminophen (TYLENOL) tablet 1,000 mg (1,000 mg Oral Given 03/25/22 0039)  lactated ringers bolus 1,000 mL (1,000 mLs Intravenous New Bag/Given 03/25/22 0249)  ibuprofen (ADVIL) tablet 800 mg (800 mg Oral Given 03/25/22 0444)     ED COURSE: EKG with no signs of dysrhythmias.  Imaging studies with no traumatic injury.  Labs with no AKI, no significant electrolyte derangements.  Mild stable anemia.  No signs of sepsis or leukocytosis.  Initial CK was slightly elevated at 2261 and after liter bolus that is trending down to  1879.  With normal kidney function I do not feel patient warrants admission for that.  Recommended increase oral hydration.  Patient's falls are most likely due to his neuropathy.  Pain control with Tylenol and ibuprofen.  Discussed my standard return precautions and follow-up   Consults: none   EMR reviewed including records from last admission to the hospital from May 2022 for cavitary pneumonia    FINAL CLINICAL IMPRESSION(S) / ED DIAGNOSES   Final diagnoses:  Fall, initial encounter     Rx / DC Orders   ED Discharge Orders     None        Note:  This document was prepared using Dragon voice recognition software and may include unintentional dictation errors.   Please note:  Patient was evaluated in Emergency Department today for the symptoms described in the history of present illness. Patient was evaluated in the context of the global COVID-19 pandemic, which necessitated consideration that the patient might be at risk for infection with the SARS-CoV-2 virus that causes COVID-19. Institutional protocols and algorithms that pertain to the evaluation of patients at risk for COVID-19 are in a state of rapid change based on information released by regulatory bodies including the CDC and federal and state organizations. These policies and algorithms were followed during the patient's care in the ED.  Some ED evaluations and interventions may be delayed as a result of limited staffing during the pandemic.       Don Perking, [place], MD 03/25/22 [phone]

## 2022-03-25 NOTE — ED Notes (Signed)
Pt able to ambulate to toilet 1 assist with stretcher pushed to bathroom.  Pt unsteady on his feet.

## 2022-03-26 ENCOUNTER — Encounter: Payer: Self-pay | Admitting: Emergency Medicine

## 2022-03-26 ENCOUNTER — Emergency Department: Payer: Self-pay

## 2022-03-26 ENCOUNTER — Inpatient Hospital Stay
Admission: EM | Admit: 2022-03-26 | Discharge: 2022-04-09 | DRG: 472 | Disposition: A | Discharge status: Home Health | Payer: Self-pay | Attending: Hospitalist | Admitting: Hospitalist

## 2022-03-26 ENCOUNTER — Other Ambulatory Visit: Payer: Self-pay

## 2022-03-26 DIAGNOSIS — W1830XA Fall on same level, unspecified, initial encounter: Secondary | ICD-10-CM | POA: Diagnosis present

## 2022-03-26 DIAGNOSIS — R262 Difficulty in walking, not elsewhere classified: Secondary | ICD-10-CM | POA: Diagnosis present

## 2022-03-26 DIAGNOSIS — W19XXXA Unspecified fall, initial encounter: Secondary | ICD-10-CM

## 2022-03-26 DIAGNOSIS — D649 Anemia, unspecified: Secondary | ICD-10-CM | POA: Diagnosis present

## 2022-03-26 DIAGNOSIS — F1010 Alcohol abuse, uncomplicated: Secondary | ICD-10-CM | POA: Diagnosis present

## 2022-03-26 DIAGNOSIS — G9520 Unspecified cord compression: Secondary | ICD-10-CM | POA: Diagnosis present

## 2022-03-26 DIAGNOSIS — Z5900 Homelessness unspecified: Secondary | ICD-10-CM

## 2022-03-26 DIAGNOSIS — Z8711 Personal history of peptic ulcer disease: Secondary | ICD-10-CM

## 2022-03-26 DIAGNOSIS — G9589 Other specified diseases of spinal cord: Secondary | ICD-10-CM | POA: Diagnosis present

## 2022-03-26 DIAGNOSIS — Z79899 Other long term (current) drug therapy: Secondary | ICD-10-CM

## 2022-03-26 DIAGNOSIS — K5900 Constipation, unspecified: Secondary | ICD-10-CM | POA: Diagnosis not present

## 2022-03-26 DIAGNOSIS — R1310 Dysphagia, unspecified: Secondary | ICD-10-CM

## 2022-03-26 DIAGNOSIS — M4802 Spinal stenosis, cervical region: Secondary | ICD-10-CM | POA: Diagnosis present

## 2022-03-26 DIAGNOSIS — M6282 Rhabdomyolysis: Secondary | ICD-10-CM

## 2022-03-26 DIAGNOSIS — Z20822 Contact with and (suspected) exposure to covid-19: Secondary | ICD-10-CM | POA: Diagnosis present

## 2022-03-26 DIAGNOSIS — M50022 Cervical disc disorder at C5-C6 level with myelopathy: Principal | ICD-10-CM | POA: Diagnosis present

## 2022-03-26 DIAGNOSIS — G621 Alcoholic polyneuropathy: Secondary | ICD-10-CM | POA: Diagnosis present

## 2022-03-26 DIAGNOSIS — F17210 Nicotine dependence, cigarettes, uncomplicated: Secondary | ICD-10-CM | POA: Diagnosis present

## 2022-03-26 DIAGNOSIS — G629 Polyneuropathy, unspecified: Principal | ICD-10-CM

## 2022-03-26 DIAGNOSIS — E876 Hypokalemia: Secondary | ICD-10-CM

## 2022-03-26 DIAGNOSIS — Y92009 Unspecified place in unspecified non-institutional (private) residence as the place of occurrence of the external cause: Secondary | ICD-10-CM

## 2022-03-26 DIAGNOSIS — T796XXA Traumatic ischemia of muscle, initial encounter: Secondary | ICD-10-CM | POA: Diagnosis present

## 2022-03-26 DIAGNOSIS — R739 Hyperglycemia, unspecified: Secondary | ICD-10-CM | POA: Diagnosis not present

## 2022-03-26 DIAGNOSIS — I10 Essential (primary) hypertension: Secondary | ICD-10-CM | POA: Diagnosis present

## 2022-03-26 DIAGNOSIS — E860 Dehydration: Secondary | ICD-10-CM | POA: Diagnosis present

## 2022-03-26 LAB — CBG MONITORING, ED: Glucose-Capillary: 87 mg/dL (ref 70–99)

## 2022-03-26 IMAGING — CT CT HEAD W/O CM
4 series · 16 of 47 positions shown, 18 images · non-contrast
Comparison: Head CT dated [DATE].

CLINICAL DATA: Trauma.



[Series 2: head wo · axial · 0.46mm/px · z∈[-155,-35]mm · 7 of 34 slices shown, 9 images]
[im 5/34  brain]
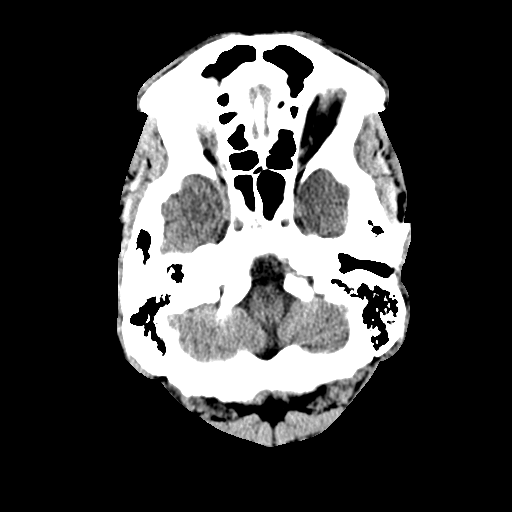
[im 5/34  bone]
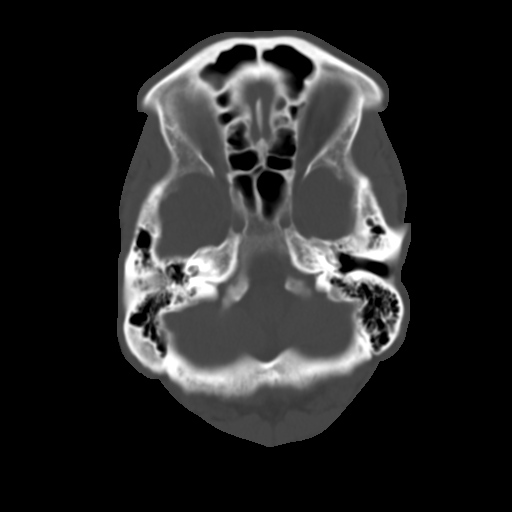
[im 9/34  brain]
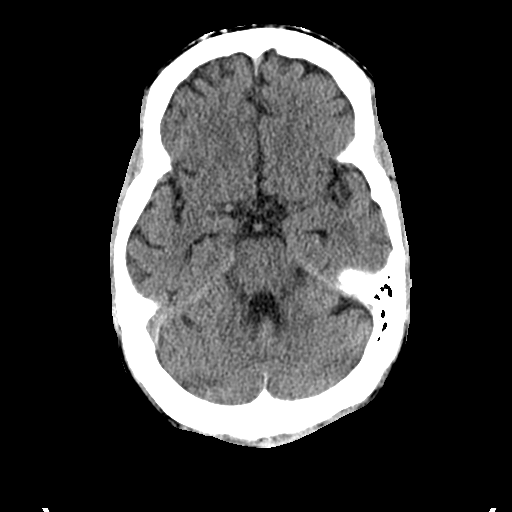
[im 13/34  brain]
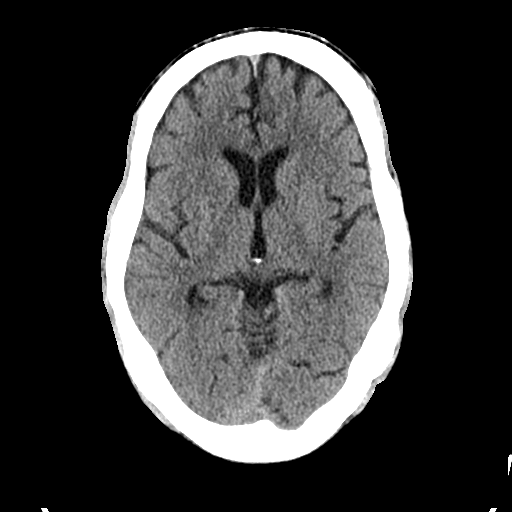
[im 17/34  brain]
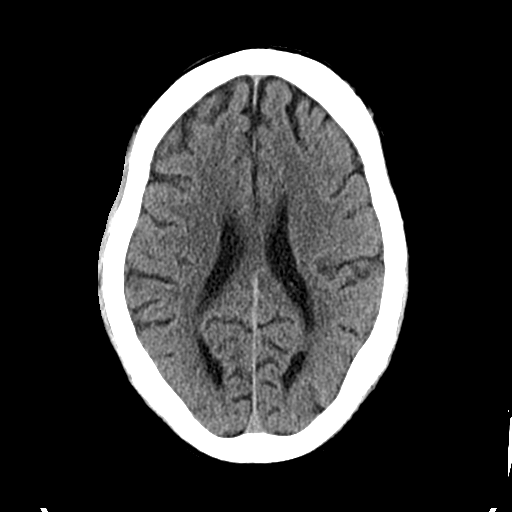
[im 21/34  brain]
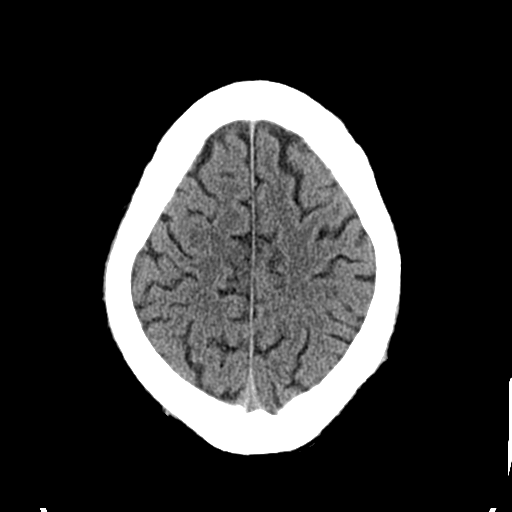
[im 21/34  bone]
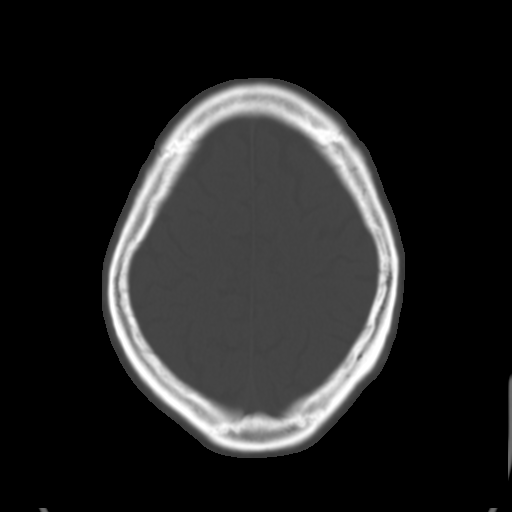
[im 25/34  brain]
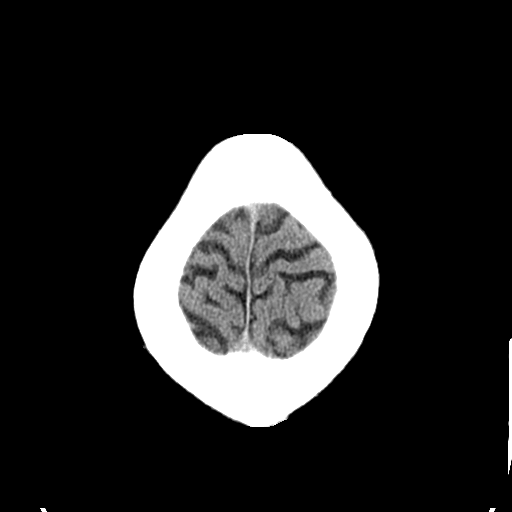
[im 29/34  brain]
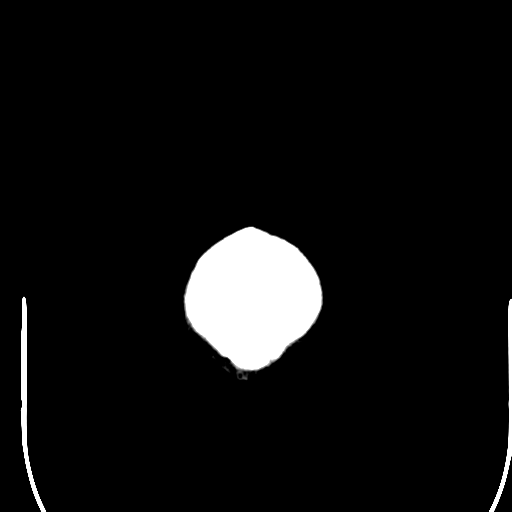

[Series 3: head bone · axial · 0.46mm/px · z∈[-159,-127]mm · 3 of 83 slices shown]
[im 9/83  bone]
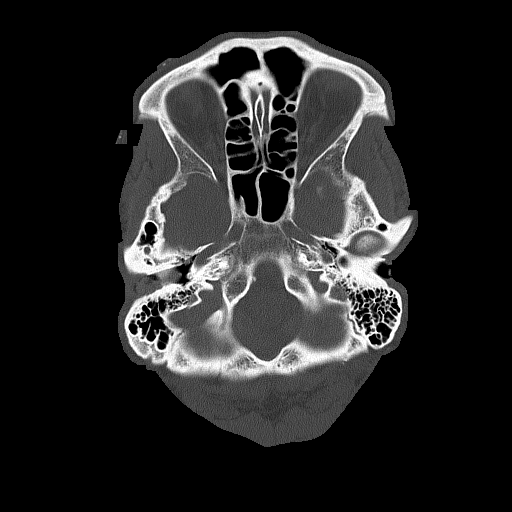
[im 17/83  bone]
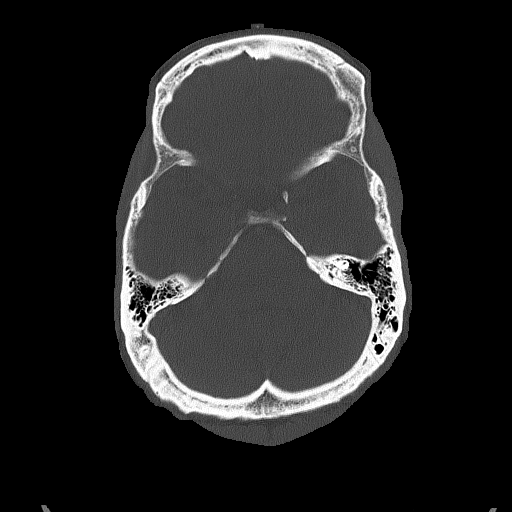
[im 25/83  bone]
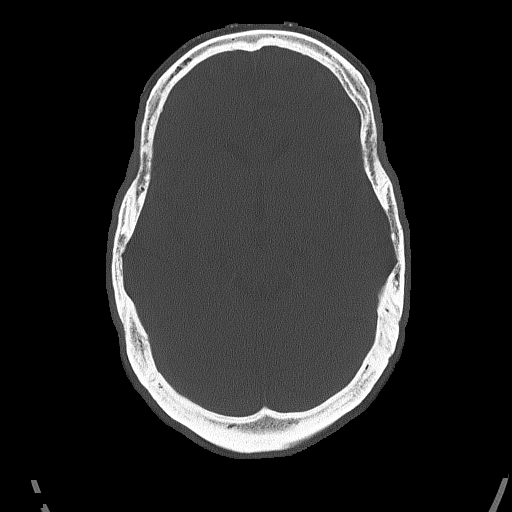

[Series 4: coronal soft tissue · coronal · 0.33mm/px · 3 of 70 slices shown]
[im 24/70  brain]
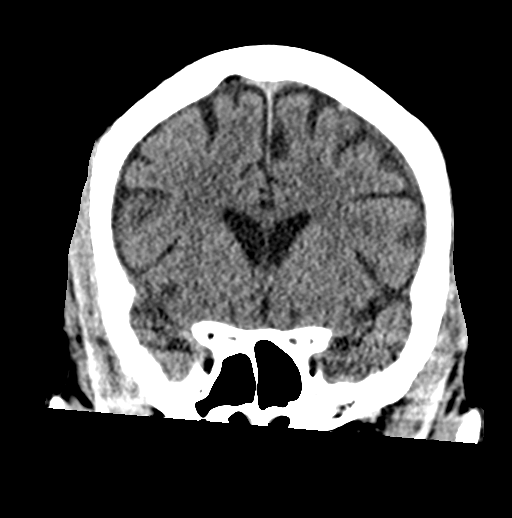
[im 31/70  brain]
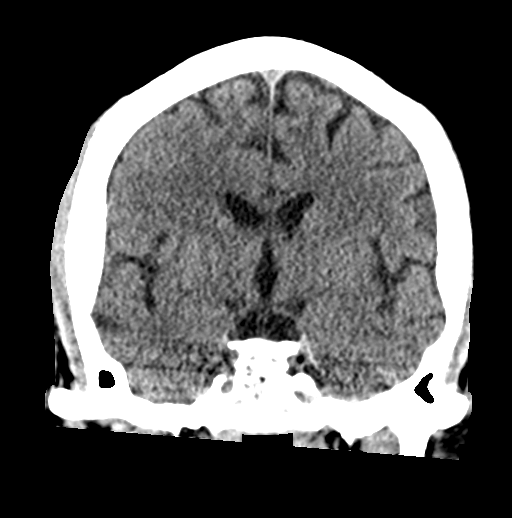
[im 39/70  brain]
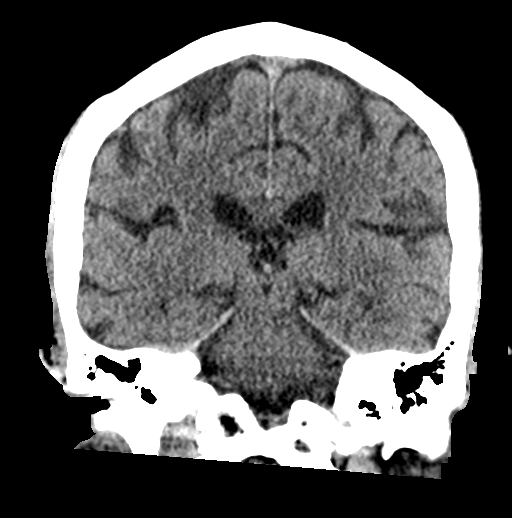

[Series 5: sagittal soft tissue · sagittal · 0.35mm/px · 3 of 52 slices shown]
[im 18/52  brain]
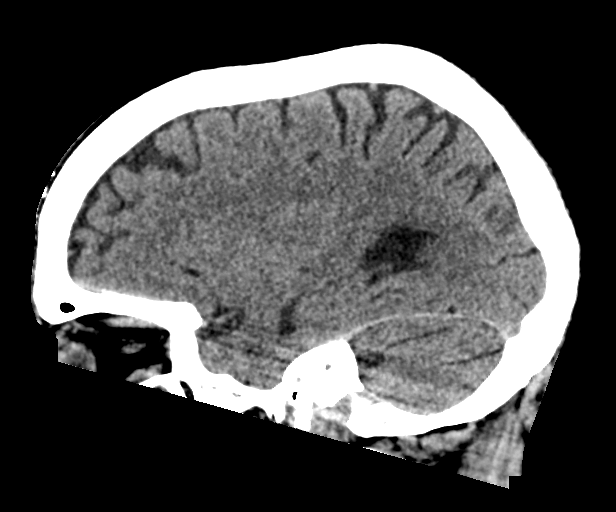
[im 26/52  brain]
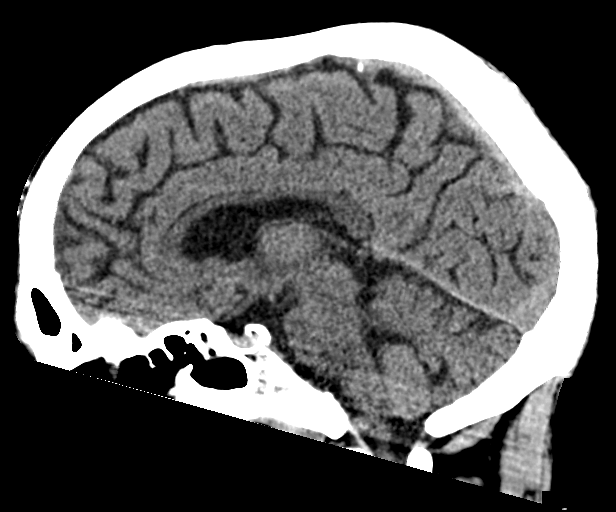
[im 35/52  brain]
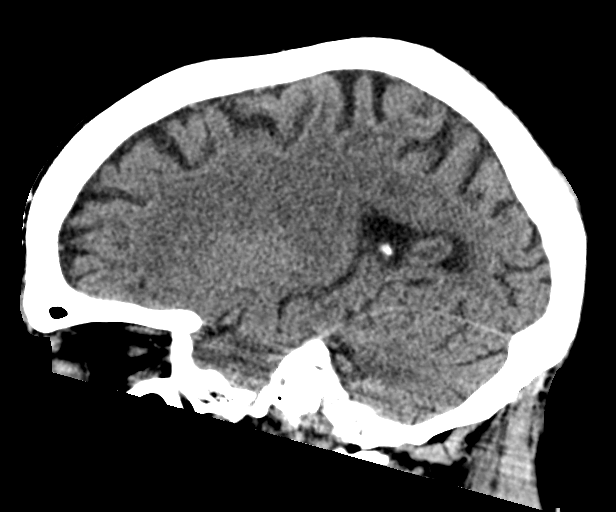

[16 of 47 positions shown; findings below may reference images not displayed]

FINDINGS: CT HEAD FINDINGS

Brain: The ventricles and sulci are appropriate size for the
patient's age. The gray-white matter discrimination is preserved.
There is no acute intracranial hemorrhage. No mass effect or midline
shift. No extra-axial fluid collection.

Vascular: No hyperdense vessel or unexpected calcification.

Skull: Normal. Negative for fracture or focal lesion.

Sinuses/Orbits: Mild mucoperiosteal thickening of paranasal sinuses.
No air-fluid. The mastoid air cells are clear.

Other: None

CT CERVICAL SPINE FINDINGS

Alignment: No acute subluxation. There is straightening of normal
cervical lordosis which may be positional or due to muscle spasm.

Skull base and vertebrae: No acute fracture.

Soft tissues and spinal canal: No prevertebral fluid or swelling. No
visible canal hematoma.

Disc levels: Degenerative changes at C5-C6 with disc space
narrowing, endplate irregularity, and spurring.

Upper chest: Negative.

Other: None
IMPRESSION: 1. No acute intracranial pathology.
2. No acute cervical spine fracture or subluxation.

## 2022-03-26 IMAGING — CT CT CERVICAL SPINE W/O CM
3 of 4 series · 10 of 35 positions shown, 12 images · non-contrast
Comparison: Head CT dated [DATE].

CLINICAL DATA: Trauma.



[Series 6: sagittal bone · sagittal · 0.23mm/px · 5 of 54 slices shown, 6 images]
[im 18/54  bone]
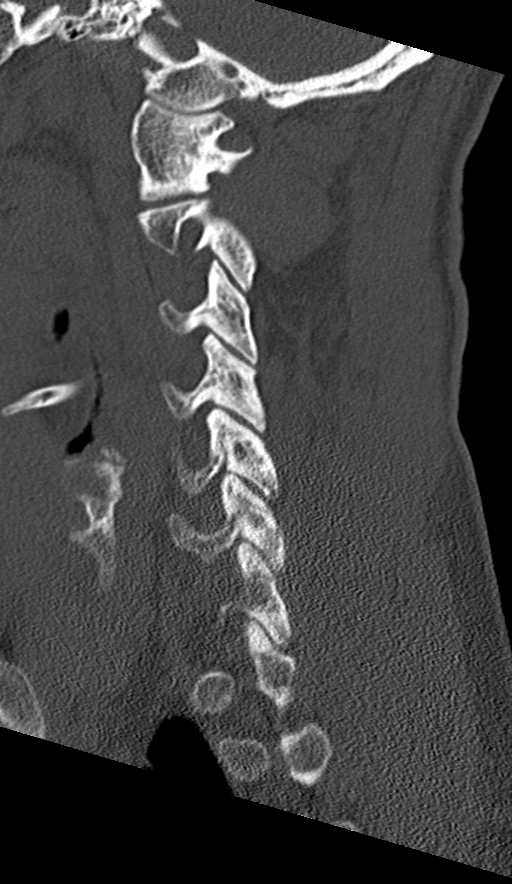
[im 23/54  bone]
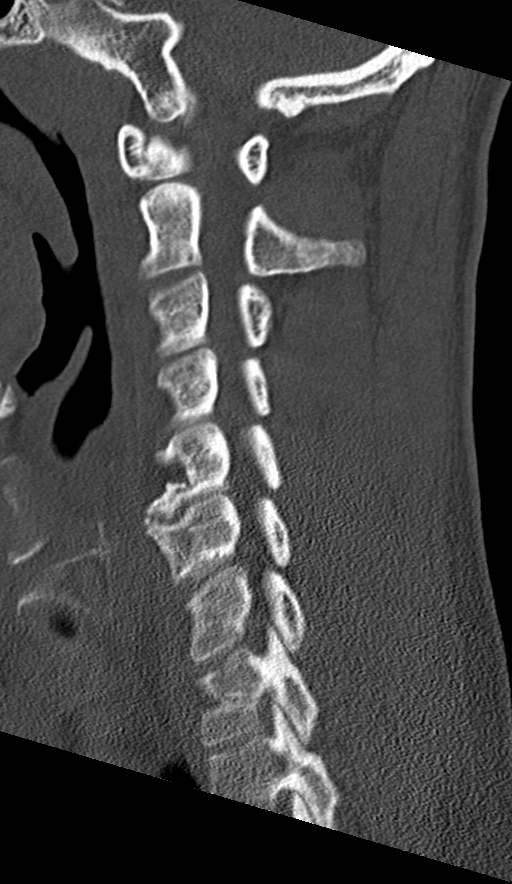
[im 27/54  soft-tissue]
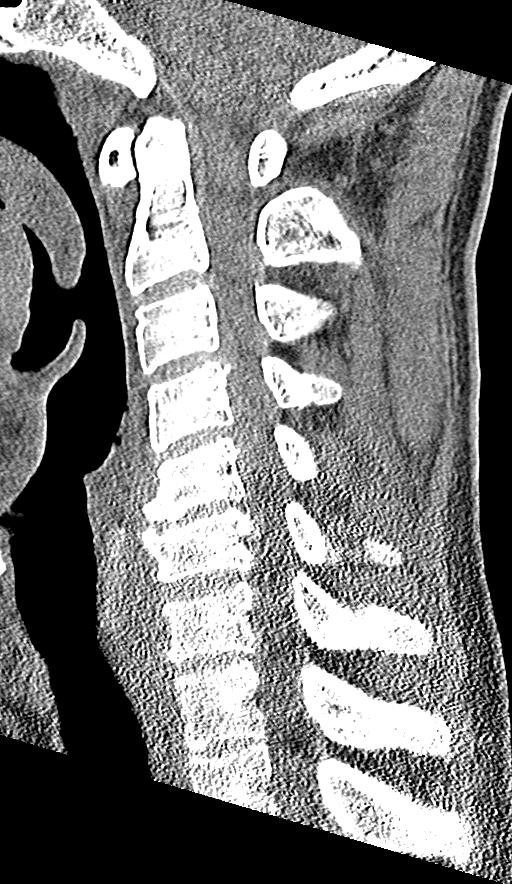
[im 27/54  bone]
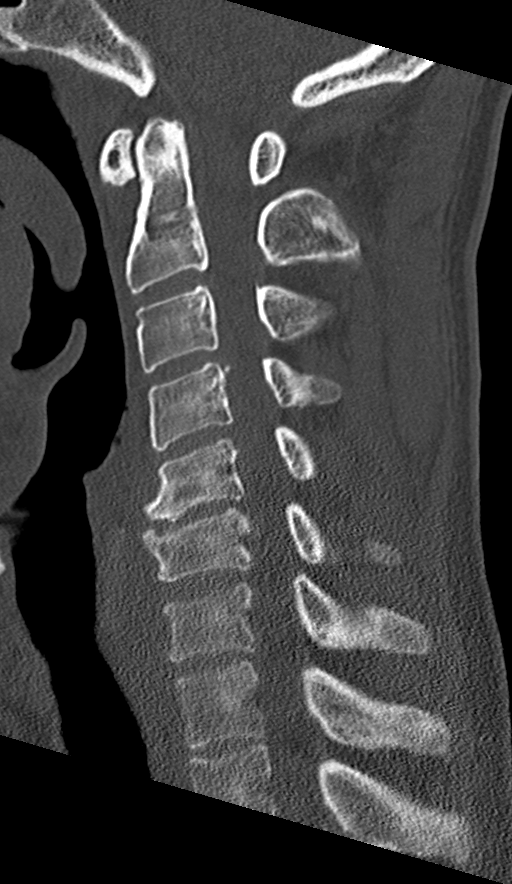
[im 31/54  bone]
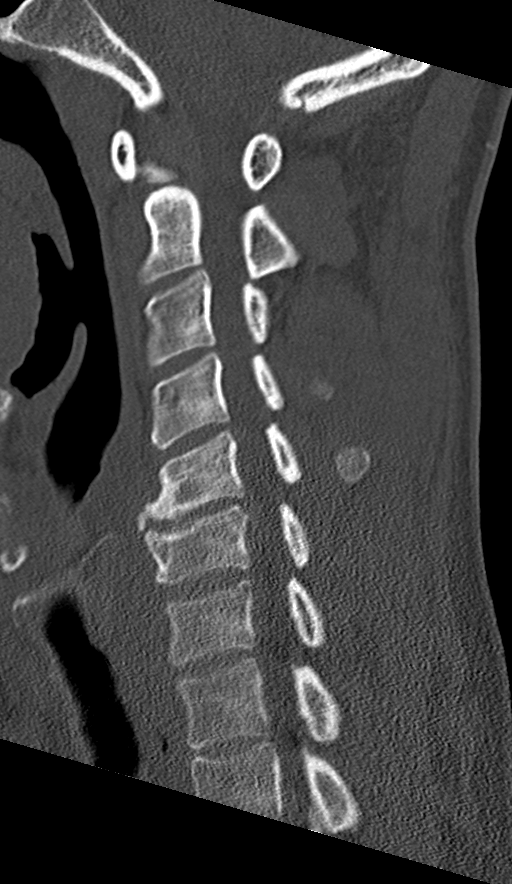
[im 36/54  bone]
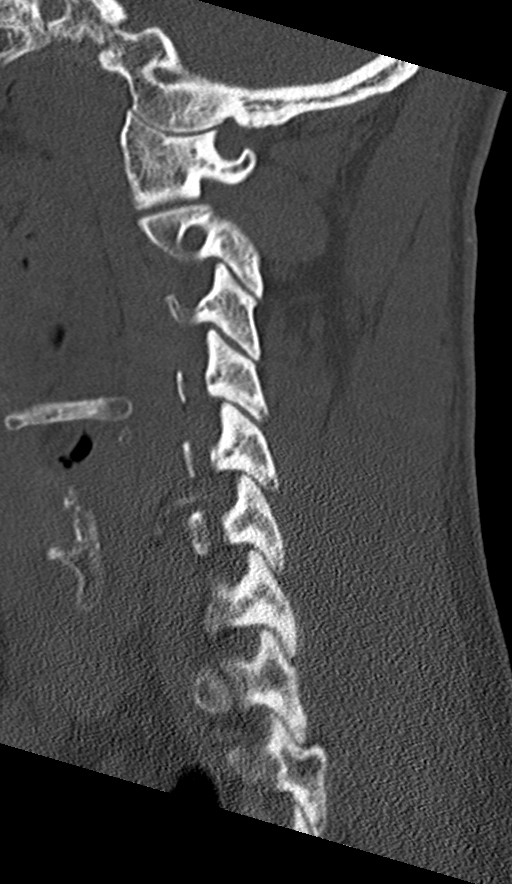

[Series 7: coronal bone · coronal · 0.21mm/px · 3 of 59 slices shown]
[im 12/59  bone]
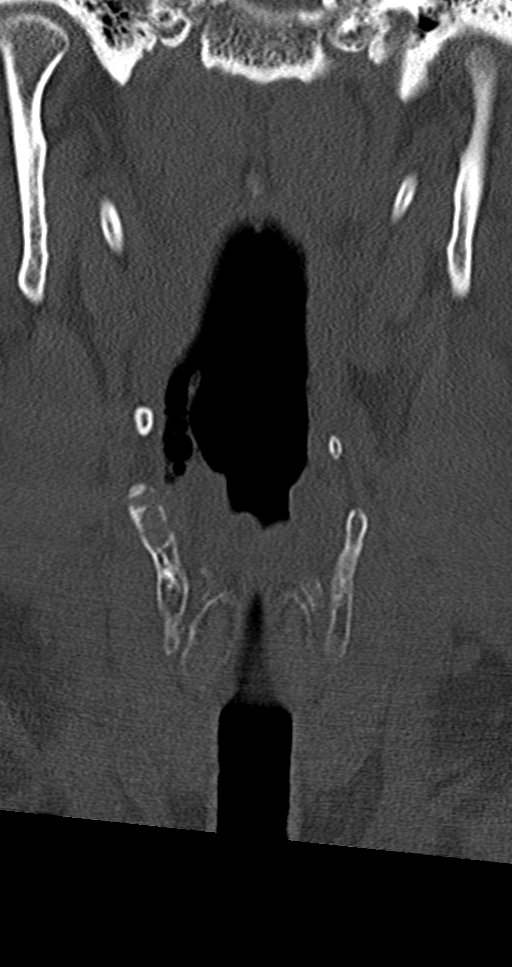
[im 24/59  bone]
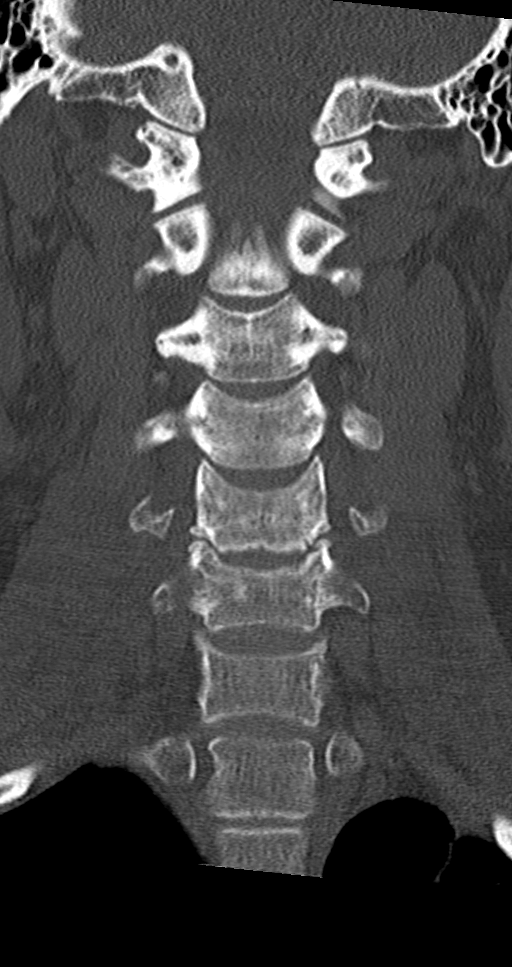
[im 35/59  bone]
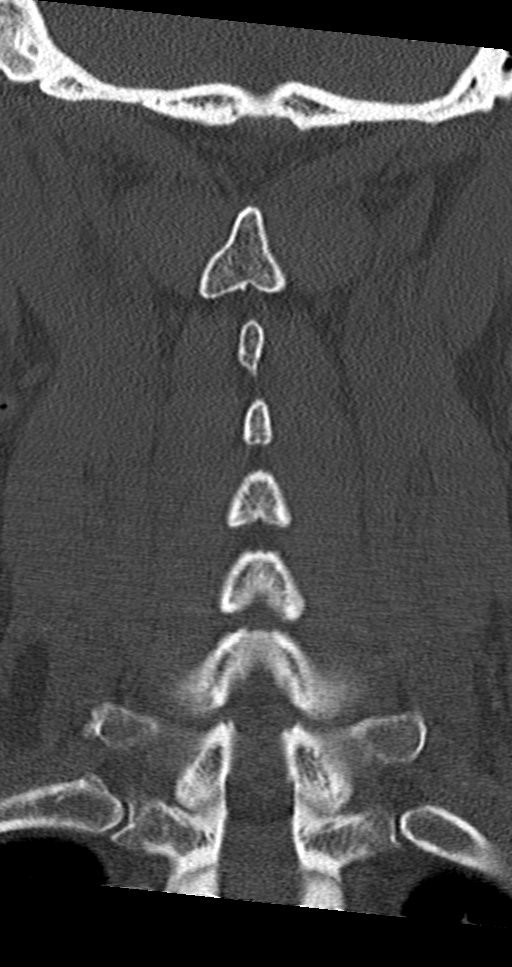

[Series 8: orthogonal bone · axial · 0.21mm/px · z∈[-319,-235]mm · 2 of 102 slices shown, 3 images]
[im 29/102  soft-tissue]
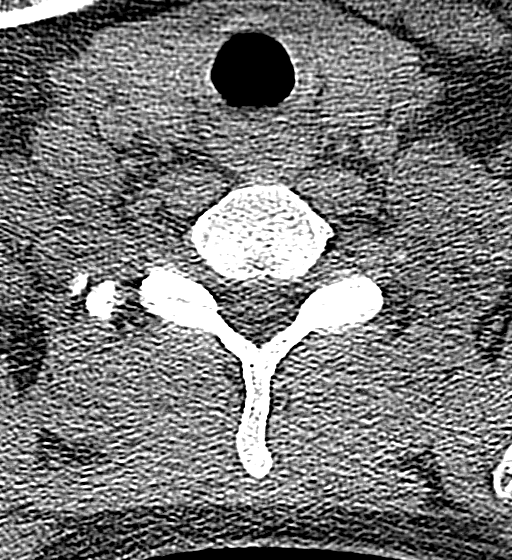
[im 29/102  bone]
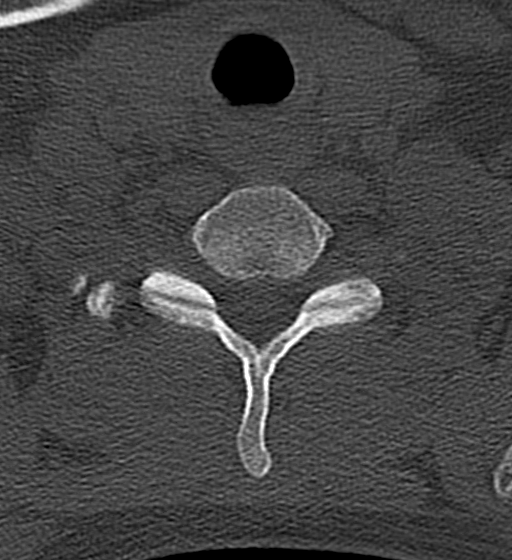
[im 73/102  bone]
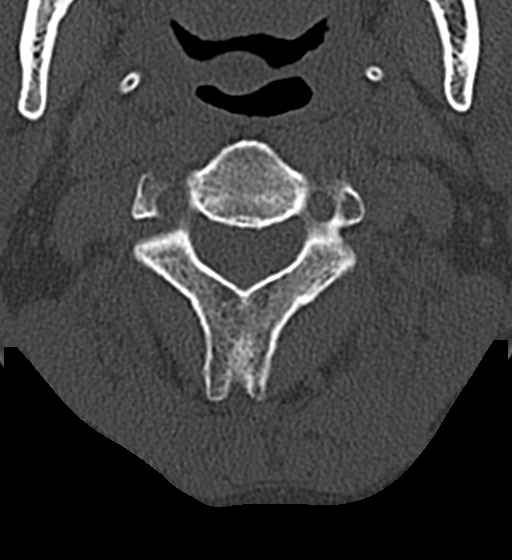

[10 of 35 positions shown; findings below may reference images not displayed]

FINDINGS: CT HEAD FINDINGS

Brain: The ventricles and sulci are appropriate size for the
patient's age. The gray-white matter discrimination is preserved.
There is no acute intracranial hemorrhage. No mass effect or midline
shift. No extra-axial fluid collection.

Vascular: No hyperdense vessel or unexpected calcification.

Skull: Normal. Negative for fracture or focal lesion.

Sinuses/Orbits: Mild mucoperiosteal thickening of paranasal sinuses.
No air-fluid. The mastoid air cells are clear.

Other: None

CT CERVICAL SPINE FINDINGS

Alignment: No acute subluxation. There is straightening of normal
cervical lordosis which may be positional or due to muscle spasm.

Skull base and vertebrae: No acute fracture.

Soft tissues and spinal canal: No prevertebral fluid or swelling. No
visible canal hematoma.

Disc levels: Degenerative changes at C5-C6 with disc space
narrowing, endplate irregularity, and spurring.

Upper chest: Negative.

Other: None
IMPRESSION: 1. No acute intracranial pathology.
2. No acute cervical spine fracture or subluxation.

## 2022-03-26 MED ORDER — THIAMINE HCL 100 MG PO TABS
100.0000 mg | ORAL_TABLET | Freq: Every day | ORAL | Status: DC
  Administered 2022-03-27 – 2022-03-28 (×2): 100 mg via ORAL
  Filled 2022-03-26 (×2): qty 1

## 2022-03-26 MED ORDER — LORAZEPAM 2 MG PO TABS
0.0000 mg | ORAL_TABLET | Freq: Four times a day (QID) | ORAL | Status: AC
  Administered 2022-03-27 (×2): 1 mg via ORAL
  Filled 2022-03-26 (×2): qty 1

## 2022-03-26 MED ORDER — FOLIC ACID 1 MG PO TABS
1.0000 mg | ORAL_TABLET | Freq: Every day | ORAL | Status: DC
  Administered 2022-03-27 – 2022-03-28 (×2): 1 mg via ORAL
  Filled 2022-03-26 (×2): qty 1

## 2022-03-26 MED ORDER — LORAZEPAM 2 MG/ML IJ SOLN: 0.0000 mg | Freq: Two times a day (BID) | INTRAMUSCULAR | Status: DC

## 2022-03-26 MED ORDER — THIAMINE HCL 100 MG PO TABS: 100.0000 mg | ORAL_TABLET | Freq: Every day | ORAL | Status: DC

## 2022-03-26 MED ORDER — LORAZEPAM 2 MG/ML IJ SOLN: 0.0000 mg | Freq: Four times a day (QID) | INTRAMUSCULAR | Status: AC

## 2022-03-26 MED ORDER — THIAMINE HCL 100 MG/ML IJ SOLN: 100.0000 mg | Freq: Every day | INTRAMUSCULAR | Status: DC

## 2022-03-26 MED ORDER — LORAZEPAM 2 MG PO TABS: 0.0000 mg | ORAL_TABLET | Freq: Two times a day (BID) | ORAL | Status: DC

## 2022-03-26 NOTE — ED Triage Notes (Signed)
Pt via EMS with complaints of neuropathy, reports was seen for same 3 days ago, pt reports he does not have any feeling to his hands and feet, reports unable to stand to go to the bathroom. Pt is homeless. Pt talks in complete sentences no respiratory distress noted

## 2022-03-26 NOTE — ED Notes (Signed)
This rn stuck pt 2x for IV and blood, IV ultrasound RN called for other try.

## 2022-03-26 NOTE — ED Provider Notes (Signed)
[facility] Provider Note    Event Date/Time   First MD Initiated Contact with Patient 03/26/22 2056     (approximate)   History   Extremity Weakness   HPI  Lance Green is a 46 y.o. male past medical history of alcohol use disorder and neuropathy presents with difficulty ambulating falls.  Patient has long history of peripheral neuropathy.  Tells me he has difficulty feeling his hands and feet this has been longstanding.  He is currently homeless.  He was seen here just the other day and given a walker.  Today he fell from the walker not sure if he hit his head but did hit his lip.  Did not lose consciousness.  Tells me he is no longer able to ambulate well.  No acute back pain no difficulty urinating or bowel incontinence.  Denies chest pain abdominal pain.  Last drink was about 5 hours ago.     Past Medical History:  Diagnosis Date   Alcohol abuse    HTN (hypertension)    Neuropathy    Pancreatitis    Peptic ulcer disease     Patient Active Problem List   Diagnosis Date Noted   Cavitary pneumonia 02/20/2021   Sepsis (HCC) 02/20/2021   Normocytic anemia 02/20/2021   HTN (hypertension)    Seizure (HCC) 11/21/2019   Alcohol abuse 11/21/2019   Hypomagnesemia 11/21/2019   Abscess of left middle finger 11/10/2018   Acute appendicitis with localized peritonitis 07/06/2018     Physical Exam  Triage Vital Signs: ED Triage Vitals  Enc Vitals Group     BP 03/26/22 2045 127/90     Pulse Rate 03/26/22 2045 83     Resp 03/26/22 2045 16     Temp 03/26/22 2045 97.8 F (36.6 C)     Temp Source 03/26/22 2045 Oral     SpO2 03/26/22 2045 97 %     Weight 03/26/22 2043 150 lb (68 kg)     Height 03/26/22 2043 5\' 7"  (1.702 m)     Head Circumference --      Peak Flow --      Pain Score 03/26/22 2042 8     Pain Loc --      Pain Edu? --      Excl. in GC? --     Most recent vital signs: Vitals:   03/26/22 2327 03/26/22 2329  BP: (!) 134/106    Pulse: 89 89  Resp: 19   Temp:    SpO2: 96% 93%     General: Awake, no distress.  CV:  Good peripheral perfusion. 1+ edema bilaterally, there is mild erythema over the left shin with a chronic ulcer Resp:  Normal effort.  No increased work of breathing Abd:  No distention.  Neuro:             Awake, Alert, Oriented x 3  Other:  Subjective decrease sensation to light touch in bilateral upper and lower extremities To 5 strength with hip flexion bilaterally, plantarflexion and dorsiflexion   ED Results / Procedures / Treatments  Labs (all labs ordered are listed, but only abnormal results are displayed) Labs Reviewed  CK  COMPREHENSIVE METABOLIC PANEL  CBC WITH DIFFERENTIAL/PLATELET  CBG MONITORING, ED     EKG     RADIOLOGY I reviewed and interpreted the CT scan of the brain which does not show any acute intracranial process  I reviewed the CT of the cervical spine which does not  show any acute fracture or misalignment; agree with radiology report     PROCEDURES:  Critical Care performed: No  Procedures    MEDICATIONS ORDERED IN ED: Medications  thiamine tablet 100 mg (has no administration in time range)  LORazepam (ATIVAN) injection 0-4 mg (has no administration in time range)    Or  LORazepam (ATIVAN) tablet 0-4 mg (has no administration in time range)  LORazepam (ATIVAN) injection 0-4 mg (has no administration in time range)    Or  LORazepam (ATIVAN) tablet 0-4 mg (has no administration in time range)  thiamine tablet 100 mg (has no administration in time range)    Or  thiamine (B-1) injection 100 mg (has no administration in time range)  folic acid (FOLVITE) tablet 1 mg (has no administration in time range)     IMPRESSION / MDM / ASSESSMENT AND PLAN / ED COURSE  I reviewed the triage vital signs and the nursing notes.                              Patient's presentation is most consistent with exacerbation of chronic illness.  Differential  diagnosis includes, but is not limited to, peripheral neuropathy due to alcohol use disorder, B12 deficiency, rhabdo, Warnicke encephalopathy  The patient is a 46 year old male with chronic alcohol use disorder and peripheral neuropathy today presenting with falls and difficulty ambulating.  Patient has been seen for this in the past.  Seen just the other day labs that revealed mildly elevated CK which cleared with fluids.  Patient tells me that this has been an ongoing issue but he is currently homeless fell today when he was using the walker that he was given last time he was here yesterday.  On exam he does have subjective decreased sensation to light touch in bilateral upper and lower extremities however strength is normal.  Suspect his peripheral neuropathy is in the setting of alcohol use disorder.  He is not showing signs of withdrawal currently.  I did obtain a CT head and C-spine given his frequent falls with unclear if he hit his head today and there are no acute findings.  Attempted to ambulate the patient he was quite unsteady on his feet.  At this point do not feel that it would be safe to discharge him.  We will have him see physical therapy and consult social work.  Placed on CIWA we will order thiamine and folate.     FINAL CLINICAL IMPRESSION(S) / ED DIAGNOSES   Final diagnoses:  Neuropathy  Ambulatory dysfunction     Rx / DC Orders   ED Discharge Orders     None        Note:  This document was prepared using Dragon voice recognition software and may include unintentional dictation errors.   Georga Hacking, MD 03/26/22 [phone]

## 2022-03-27 LAB — CBC WITH DIFFERENTIAL/PLATELET
Abs Immature Granulocytes: 0.02 10*3/uL (ref 0.00–0.07)
Basophils Absolute: 0.1 10*3/uL (ref 0.0–0.1)
Basophils Relative: 1 %
Eosinophils Absolute: 0.2 10*3/uL (ref 0.0–0.5)
Eosinophils Relative: 3 %
HCT: 38.1 % — ABNORMAL LOW (ref 39.0–52.0)
Hemoglobin: 13 g/dL (ref 13.0–17.0)
Immature Granulocytes: 0 %
Lymphocytes Relative: 35 %
Lymphs Abs: 2.3 10*3/uL (ref 0.7–4.0)
MCH: 31.9 pg (ref 26.0–34.0)
MCHC: 34.1 g/dL (ref 30.0–36.0)
MCV: 93.6 fL (ref 80.0–100.0)
Monocytes Absolute: 0.5 10*3/uL (ref 0.1–1.0)
Monocytes Relative: 8 %
Neutro Abs: 3.4 10*3/uL (ref 1.7–7.7)
Neutrophils Relative %: 53 %
Platelets: 134 10*3/uL — ABNORMAL LOW (ref 150–400)
RBC: 4.07 MIL/uL — ABNORMAL LOW (ref 4.22–5.81)
RDW: 12.2 % (ref 11.5–15.5)
WBC: 6.4 10*3/uL (ref 4.0–10.5)
nRBC: 0 % (ref 0.0–0.2)

## 2022-03-27 LAB — C DIFFICILE QUICK SCREEN W PCR REFLEX
C Diff antigen: NEGATIVE
C Diff interpretation: NOT DETECTED
C Diff toxin: NEGATIVE

## 2022-03-27 LAB — CK: Total CK: 1009 U/L — ABNORMAL HIGH (ref 49–397)

## 2022-03-27 LAB — COMPREHENSIVE METABOLIC PANEL
ALT: 49 U/L — ABNORMAL HIGH (ref 0–44)
AST: 94 U/L — ABNORMAL HIGH (ref 15–41)
Albumin: 3.9 g/dL (ref 3.5–5.0)
Alkaline Phosphatase: 113 U/L (ref 38–126)
Anion gap: 7 (ref 5–15)
BUN: 7 mg/dL (ref 6–20)
CO2: 25 mmol/L (ref 22–32)
Calcium: 9.2 mg/dL (ref 8.9–10.3)
Chloride: 111 mmol/L (ref 98–111)
Creatinine, Ser: 0.63 mg/dL (ref 0.61–1.24)
GFR, Estimated: >60 mL/min (ref >60)
Glucose, Bld: 105 mg/dL — ABNORMAL HIGH (ref 70–99)
Potassium: 3.1 mmol/L — ABNORMAL LOW (ref 3.5–5.1)
Sodium: 143 mmol/L (ref 135–145)
Total Bilirubin: 0.5 mg/dL (ref 0.3–1.2)
Total Protein: 7.3 g/dL (ref 6.5–8.1)

## 2022-03-27 LAB — RESP PANEL BY RT-PCR (FLU A&B, COVID) ARPGX2
Influenza A by PCR: NEGATIVE
Influenza B by PCR: NEGATIVE
SARS Coronavirus 2 by RT PCR: NEGATIVE

## 2022-03-27 MED ORDER — POTASSIUM CHLORIDE CRYS ER 20 MEQ PO TBCR
40.0000 meq | EXTENDED_RELEASE_TABLET | Freq: Once | ORAL | Status: AC
Start: 2022-03-27 — End: 2022-03-27
  Administered 2022-03-27: 40 meq via ORAL
  Filled 2022-03-27: qty 2

## 2022-03-27 NOTE — ED Notes (Signed)
Care assumed of pt at this time.  Pt resting in room with eyes closed at this time.  Pt arousable to voice.  Denies any needs at this time.

## 2022-03-27 NOTE — ED Notes (Signed)
Pt. Sitting comfortably in recliner. Denies need currently. NAD. 

## 2022-03-27 NOTE — Evaluation (Signed)
Physical Therapy Evaluation Patient Details Name: Lance Green MRN: [id number] DOB: [birth year] Today's Date: 03/27/2022  History of Present Illness  Lance Green is a 46 y.o. male past medical history of alcohol use disorder and neuropathy presents with difficulty ambulating falls.  Patient has long history of peripheral neuropathy.  Tells me he has difficulty feeling his hands and feet this has been longstanding.  He is currently homeless.  He was seen here just the other day and given a walker. PMH includes: ETOH, pancreatitis.   Clinical Impression  Patient is received on stretcher in ED. He is agreeable to PT. Reports he needs more help. He is homeless and unable to manage out there. Reports multiple falls. Evaluation reveals weakness and decreased rom throughout all extremities. Jerking motion with mobility leading to increased fall risk. Patient will continue to benefit from skilled PT while here to improve functional independence, strength safety and rom.        Recommendations for follow up therapy are one component of a multi-disciplinary discharge planning process, led by the attending physician.  Recommendations may be updated based on patient status, additional functional criteria and insurance authorization.  Follow Up Recommendations Skilled nursing-short term rehab (<3 hours/day)      Assistance Recommended at Discharge Frequent or constant Supervision/Assistance  Patient can return home with the following  Two people to help with walking and/or transfers;A lot of help with bathing/dressing/bathroom;Assistance with feeding;Help with stairs or ramp for entrance;Assist for transportation;Assistance with cooking/housework    Equipment Recommendations None recommended by PT;Other (comment) (TBD)  Recommendations for Other Services       Functional Status Assessment Patient has had a recent decline in their functional status and demonstrates the ability to make significant  improvements in function in a reasonable and predictable amount of time.     Precautions / Restrictions Precautions Precautions: Fall Restrictions Weight Bearing Restrictions: No      Mobility  Bed Mobility Overal bed mobility: Needs Assistance Bed Mobility: Supine to Sit     Supine to sit: Min assist     General bed mobility comments: required min A to raise trunk to seated position    Transfers Overall transfer level: Needs assistance Equipment used: Rolling walker (2 wheels) Transfers: Sit to/from Stand Sit to Stand: Mod assist           General transfer comment: required mod assist for sit to stand with blocking of feet. Lots of jerking and trembling with sit to stand. Was able to settle jerking with static standing after ~1 min.    Ambulation/Gait Ambulation/Gait assistance: Mod assist Gait Distance (Feet): 3 Feet Assistive device: Rolling walker (2 wheels) Gait Pattern/deviations: Step-to pattern, Decreased step length - right, Decreased step length - left, Decreased stride length Gait velocity: decr   Pre-gait activities: marching in place General Gait Details: patient was able to take a few steps from bed to recliner with mod A. Slow pace, assistance needed to maneuver RW  Stairs            Wheelchair Mobility    Modified Rankin (Stroke Patients Only)       Balance Overall balance assessment: Needs assistance, History of Falls Sitting-balance support: Feet supported Sitting balance-Leahy Scale: Fair     Standing balance support: Bilateral upper extremity supported, During functional activity, Reliant on assistive device for balance Standing balance-Leahy Scale: Poor  Pertinent Vitals/Pain Pain Assessment Pain Assessment: Faces Faces Pain Scale: Hurts a little bit Pain Location: back Pain Descriptors / Indicators: Discomfort Pain Intervention(s): Monitored during session    Home Living  Family/patient expects to be discharged to:: Shelter/Homeless                        Prior Function Prior Level of Function : Needs assist             Mobility Comments: patient reports multiple falls, was given walker a few days ago and continues to fall with walker ADLs Comments: needs assistance     Hand Dominance   Dominant Hand: Right    Extremity/Trunk Assessment   Upper Extremity Assessment Upper Extremity Assessment: RUE deficits/detail;LUE deficits/detail RUE Sensation: decreased light touch RUE Coordination: decreased gross motor LUE Deficits / Details: limited shoulder ROM bilaterally to about shoulder height. Poor strength and ROM throuhgout B UEs. LUE Sensation: decreased light touch LUE Coordination: decreased gross motor    Lower Extremity Assessment Lower Extremity Assessment: Generalized weakness (patient has poor strength and general rom thoughtou extremities. Jerking/shaking with acitivty)    Cervical / Trunk Assessment Cervical / Trunk Assessment: Normal  Communication   Communication: No difficulties  Cognition Arousal/Alertness: Awake/alert Behavior During Therapy: WFL for tasks assessed/performed Overall Cognitive Status: Within Functional Limits for tasks assessed                                          General Comments      Exercises     Assessment/Plan    PT Assessment Patient needs continued PT services  PT Problem List Decreased strength;Decreased mobility;Decreased range of motion;Decreased activity tolerance;Decreased balance;Pain;Decreased knowledge of use of DME;Decreased coordination       PT Treatment Interventions DME instruction;Therapeutic exercise;Gait training;Balance training;Functional mobility training;Therapeutic activities;Patient/family education;Manual techniques    PT Goals (Current goals can be found in the Care Plan section)  Acute Rehab PT Goals Patient Stated Goal: to get more  help. PT Goal Formulation: With patient Time For Goal Achievement: 04/10/22 Potential to Achieve Goals: Fair    Frequency Min 2X/week     Co-evaluation               AM-PAC PT "6 Clicks" Mobility  Outcome Measure Help needed turning from your back to your side while in a flat bed without using bedrails?: A Little Help needed moving from lying on your back to sitting on the side of a flat bed without using bedrails?: A Little Help needed moving to and from a bed to a chair (including a wheelchair)?: A Lot Help needed standing up from a chair using your arms (e.g., wheelchair or bedside chair)?: A Lot Help needed to walk in hospital room?: A Lot Help needed climbing 3-5 steps with a railing? : Total 6 Click Score: 13    End of Session Equipment Utilized During Treatment: Gait belt Activity Tolerance: Patient tolerated treatment well Patient left: in chair;with call bell/phone within reach Nurse Communication: Mobility status PT Visit Diagnosis: Unsteadiness on feet (R26.81);Repeated falls (R29.6);Muscle weakness (generalized) (M62.81);Other abnormalities of gait and mobility (R26.89);Difficulty in walking, not elsewhere classified (R26.2);Other symptoms and signs involving the nervous system (R29.898)    Time: 0086-7619 PT Time Calculation (min) (ACUTE ONLY): 22 min   Charges:   PT Evaluation $PT Eval Moderate Complexity: 1 Mod PT Treatments $  Therapeutic Activity: 8-22 mins        Calleen Alvis, PT, GCS 03/27/22,10:26 AM

## 2022-03-27 NOTE — ED Notes (Signed)
Pt used call bell to notify needed to have BM. Upon entrance to room pt found to be sitting in diarrhea. Pt reported couldn't hold it. Pt assisted to sit on toilet as stated thought might have more to release. Peri care provided. Recliner cleansed and linens changed. Pt assisted back into recliner.

## 2022-03-27 NOTE — ED Notes (Signed)
Pt. Sleeping in chair, eyes closed, chest rise and fall.

## 2022-03-27 NOTE — ED Notes (Signed)
Pt. Sitting comfortably in recliner. Denies need currently. NAD.

## 2022-03-27 NOTE — ED Notes (Signed)
Pt. At approx 75% of lunch, denies any pain, n/v, visual or auditory hallucinations. Pt. Table cleared of trash, repositioned for comfort. Pt. Denies any further need or desire.

## 2022-03-27 NOTE — ED Notes (Signed)
Pt. Stood with walker and assistance long enough for this RN to change linens and chucks on pt's chair. While standing, pt. Had episode of instability. Pt. Steadied by this RN. Pt. Seated back in recliner per request. Cheree Ditto crackers provided. Pt. Denies further need.

## 2022-03-27 NOTE — TOC Initial Note (Signed)
Transition of Care [facility]) - Initial/Assessment Note    Patient Details  Name: Lance Green MRN: [id number] Date of Birth: [birth year]  Transition of Care [facility]) CM/SW Contact:    Allayne Butcher, RN Phone Number: 03/27/2022, 11:56 AM  Clinical Narrative:                 Patient came into the emergency room after falling frequently for the past few weeks.  Patient has severe neuropathy and cannot feel his hands or feet, patient was given a walker at last emergency room visit but he reports falling with it 3 times yesterday.   Patient is homeless and sleeps in the [place].  He reports he wants to get better and be able to take care of himself.  He would like to go to rehab.  He reports that he has Family planning Medicaid and has applied for SSI and it is in the final determination stage.  RNCM will see if SNF can be found that will accept an LOG.    Expected Discharge Plan: Skilled Nursing Facility Barriers to Discharge: SNF Pending bed offer   Patient Goals and CMS Choice Patient states their goals for this hospitalization and ongoing recovery are:: patient wants to get better and be able to take care of himself CMS Medicare.gov Compare Post Acute Care list provided to:: Patient Choice offered to / list presented to : Patient  Expected Discharge Plan and Services Expected Discharge Plan: Skilled Nursing Facility   Discharge Planning Services: CM Consult Post Acute Care Choice: Skilled Nursing Facility Living arrangements for the past 2 months: Homeless                 DME Arranged: N/A DME Agency: NA       HH Arranged: NA HH Agency: NA        Prior Living Arrangements/Services Living arrangements for the past 2 months: Homeless Lives with:: Self Patient language and need for interpreter reviewed:: Yes Do you feel safe going back to the place where you live?: Yes      Need for Family Participation in Patient Care: Yes (Comment) Care giver support system in  place?: No (comment) Current home services: DME (walker) Criminal Activity/Legal Involvement Pertinent to Current Situation/Hospitalization: No - Comment as needed  Activities of Daily Living      Permission Sought/Granted Permission sought to share information with : Case Manager, Magazine features editor Permission granted to share information with : Yes, Verbal Permission Granted     Permission granted to share info w AGENCY: SNF's        Emotional Assessment Appearance:: Appears older than stated age Attitude/Demeanor/Rapport: Engaged, Crying Affect (typically observed): Accepting, Hopeless, Sad, Tearful/Crying Orientation: : Oriented to Self, Oriented to  Time, Oriented to Situation, Oriented to Place Alcohol / Substance Use: Alcohol Use Psych Involvement: No (comment)  Admission diagnosis:  weakness Patient Active Problem List   Diagnosis Date Noted   Cavitary pneumonia 02/20/2021   Sepsis (HCC) 02/20/2021   Normocytic anemia 02/20/2021   HTN (hypertension)    Seizure (HCC) 11/21/2019   Alcohol abuse 11/21/2019   Hypomagnesemia 11/21/2019   Abscess of left middle finger 11/10/2018   Acute appendicitis with localized peritonitis 07/06/2018   PCP:  Pcp, No Pharmacy:   [facility] [address]), [place] - [address] [place]) [place] [address] Phone: [phone] Fax: [phone]     Social Determinants of Health (SDOH) Interventions  Readmission Risk Interventions     No data to display

## 2022-03-27 NOTE — NC FL2 (Signed)
[place] MEDICAID FL2 LEVEL OF CARE SCREENING TOOL     IDENTIFICATION  Patient Name: Lance Green Birthdate: 1975/12/28 Sex: male Admission Date (Current Location): 03/26/2022  [facility] and [place] Number:  Chiropodist and Address:  [facility], [address], [place], [place] [address]      Provider Number: [id number]  Attending Physician Name and Address:  No att. providers found  Relative Name and Phone Number:  Loleta Books- mother [phone]    Current Level of Care: Hospital Recommended Level of Care: Skilled Nursing Facility Prior Approval Number:    Date Approved/Denied:   PASRR Number: [id number]  Discharge Plan: SNF    Current Diagnoses: Patient Active Problem List   Diagnosis Date Noted   Cavitary pneumonia 02/20/2021   Sepsis (HCC) 02/20/2021   Normocytic anemia 02/20/2021   HTN (hypertension)    Seizure (HCC) 11/21/2019   Alcohol abuse 11/21/2019   Hypomagnesemia 11/21/2019   Abscess of left middle finger 11/10/2018   Acute appendicitis with localized peritonitis 07/06/2018    Orientation RESPIRATION BLADDER Height & Weight     Self, Time, Situation, Place  Normal Continent Weight: 68 kg Height:  5\' 7"  (170.2 cm)  BEHAVIORAL SYMPTOMS/MOOD NEUROLOGICAL BOWEL NUTRITION STATUS      Continent Diet (Regular)  AMBULATORY STATUS COMMUNICATION OF NEEDS Skin   Limited Assist Verbally Skin abrasions                       Personal Care Assistance Level of Assistance  Bathing, Feeding, Dressing Bathing Assistance: Limited assistance Feeding assistance: Limited assistance Dressing Assistance: Limited assistance     Functional Limitations Info  Sight, Hearing, Speech Sight Info: Adequate Hearing Info: Adequate Speech Info: Adequate    SPECIAL CARE FACTORS FREQUENCY  PT (By licensed PT), OT (By licensed OT)     PT Frequency: 5 times per week OT Frequency: 5 times per week             Contractures Contractures Info: Present (fingers)    Additional Factors Info  Code Status, Allergies Code Status Info: Full Allergies Info: NKA           Current Medications (03/27/2022):  This is the current hospital active medication list Current Facility-Administered Medications  Medication Dose Route Frequency Provider Last Rate Last Admin   folic acid (FOLVITE) tablet 1 mg  1 mg Oral Daily 03/29/2022, MD   1 mg at 03/27/22 1048   LORazepam (ATIVAN) injection 0-4 mg  0-4 mg Intravenous Q6H 03/29/22, MD       Or   LORazepam (ATIVAN) tablet 0-4 mg  0-4 mg Oral Q6H Georga Hacking, MD   1 mg at 03/27/22 1048   [START ON 03/29/2022] LORazepam (ATIVAN) injection 0-4 mg  0-4 mg Intravenous Q12H 03/31/2022, MD       Or   Georga Hacking ON 03/29/2022] LORazepam (ATIVAN) tablet 0-4 mg  0-4 mg Oral Q12H 03/31/2022, MD       thiamine tablet 100 mg  100 mg Oral Daily Georga Hacking, MD   100 mg at 03/27/22 1048   Or   thiamine (B-1) injection 100 mg  100 mg Intravenous Daily 03/29/22, MD       Current Outpatient Medications  Medication Sig Dispense Refill   amLODipine (NORVASC) 2.5 MG tablet Take 1 tablet (2.5 mg total) by mouth daily. (Patient not taking: Reported on 02/20/2021) 30 tablet  0   FERROUS SULFATE PO Take 1 tablet by mouth daily. (Patient not taking: Reported on 02/23/2022)     folic acid (FOLVITE) 1 MG tablet Take 1 tablet (1 mg total) by mouth daily. (Patient not taking: Reported on 02/20/2021) 30 tablet 0   hydrOXYzine (ATARAX) 25 MG tablet Take 1 tablet (25 mg total) by mouth every 6 (six) hours as needed for itching. (Patient not taking: Reported on 01/26/2022) 30 tablet 0   levETIRAcetam (KEPPRA) 250 MG tablet Take 1 tablet (250 mg total) by mouth 2 (two) times daily. (Patient not taking: Reported on 02/20/2021) 60 tablet 0   levETIRAcetam (KEPPRA) 250 MG tablet Take 1 tablet (250 mg total) by mouth 2 (two) times daily. 120 tablet 0    Multiple Vitamin (MULTIVITAMIN WITH MINERALS) TABS tablet Take 1 tablet by mouth daily. (Patient not taking: Reported on 02/20/2021) 30 tablet 0   thiamine 100 MG tablet Take 1 tablet (100 mg total) by mouth daily. (Patient not taking: Reported on 02/20/2021) 30 tablet 0   triamcinolone ointment (KENALOG) 0.5 % Apply 1 application topically 2 (two) times daily. (Patient not taking: Reported on 01/26/2022) 30 g 2     Discharge Medications: Please see discharge summary for a list of discharge medications.  Relevant Imaging Results:  Relevant Lab Results:   Additional Information SS# [id number]  Allayne Butcher, RN

## 2022-03-28 ENCOUNTER — Emergency Department: Payer: Self-pay

## 2022-03-28 DIAGNOSIS — R262 Difficulty in walking, not elsewhere classified: Secondary | ICD-10-CM

## 2022-03-28 DIAGNOSIS — G9520 Unspecified cord compression: Secondary | ICD-10-CM | POA: Diagnosis present

## 2022-03-28 LAB — CBC WITH DIFFERENTIAL/PLATELET
Abs Immature Granulocytes: 0.03 10*3/uL (ref 0.00–0.07)
Basophils Absolute: 0 10*3/uL (ref 0.0–0.1)
Basophils Relative: 1 %
Eosinophils Absolute: 0.2 10*3/uL (ref 0.0–0.5)
Eosinophils Relative: 3 %
HCT: 35.1 % — ABNORMAL LOW (ref 39.0–52.0)
Hemoglobin: 11.9 g/dL — ABNORMAL LOW (ref 13.0–17.0)
Immature Granulocytes: 0 %
Lymphocytes Relative: 27 %
Lymphs Abs: 2.1 10*3/uL (ref 0.7–4.0)
MCH: 32.2 pg (ref 26.0–34.0)
MCHC: 33.9 g/dL (ref 30.0–36.0)
MCV: 94.9 fL (ref 80.0–100.0)
Monocytes Absolute: 0.7 10*3/uL (ref 0.1–1.0)
Monocytes Relative: 9 %
Neutro Abs: 4.7 10*3/uL (ref 1.7–7.7)
Neutrophils Relative %: 60 %
Platelets: 150 10*3/uL (ref 150–400)
RBC: 3.7 MIL/uL — ABNORMAL LOW (ref 4.22–5.81)
RDW: 12.2 % (ref 11.5–15.5)
WBC: 7.7 10*3/uL (ref 4.0–10.5)
nRBC: 0 % (ref 0.0–0.2)

## 2022-03-28 LAB — BASIC METABOLIC PANEL
Anion gap: 10 (ref 5–15)
BUN: 8 mg/dL (ref 6–20)
CO2: 23 mmol/L (ref 22–32)
Calcium: 9.2 mg/dL (ref 8.9–10.3)
Chloride: 105 mmol/L (ref 98–111)
Creatinine, Ser: 0.85 mg/dL (ref 0.61–1.24)
GFR, Estimated: >60 mL/min (ref >60)
Glucose, Bld: 119 mg/dL — ABNORMAL HIGH (ref 70–99)
Potassium: 3.6 mmol/L (ref 3.5–5.1)
Sodium: 138 mmol/L (ref 135–145)

## 2022-03-28 LAB — URINE DRUG SCREEN, QUALITATIVE (ARMC ONLY)
Amphetamines, Ur Screen: NOT DETECTED
Barbiturates, Ur Screen: NOT DETECTED
Benzodiazepine, Ur Scrn: NOT DETECTED
Cannabinoid 50 Ng, Ur ~~LOC~~: NOT DETECTED
Cocaine Metabolite,Ur ~~LOC~~: NOT DETECTED
MDMA (Ecstasy)Ur Screen: NOT DETECTED
Methadone Scn, Ur: NOT DETECTED
Opiate, Ur Screen: NOT DETECTED
Phencyclidine (PCP) Ur S: NOT DETECTED
Tricyclic, Ur Screen: NOT DETECTED

## 2022-03-28 LAB — ETHANOL: Alcohol, Ethyl (B): <10 mg/dL (ref <10)

## 2022-03-28 LAB — CK: Total CK: 416 U/L — ABNORMAL HIGH (ref 49–397)

## 2022-03-28 IMAGING — MR MR THORACIC SPINE WO/W CM
7 of 9 series · 29 of 48 positions shown · non-contrast
Comparison: Neck CT [DATE]

CLINICAL DATA: Nontraumatic ataxia. Three year history of worsening
gait disturbance, hand weakness and leg numbness.

EXAM:
MRI CERVICAL AND THORACIC SPINE WITHOUT CONTRAST
TECHNIQUE: Multiplanar and multiecho pulse sequences of the cervical spine, to
include the craniocervical junction and cervicothoracic junction,
and the thoracic spine, were obtained without intravenous contrast.

[Series 22: T1 · sagittal · 6.0mm · 1.88mm/px · 1 of 9 slices shown (1 of 3)]
[im 1/9]
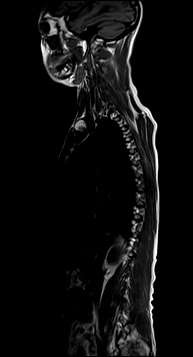

[Series 23: T2 · sagittal · 3.0mm · 1.06mm/px · 3 of 17 slices shown (1 of 2)]
[im 1/17]
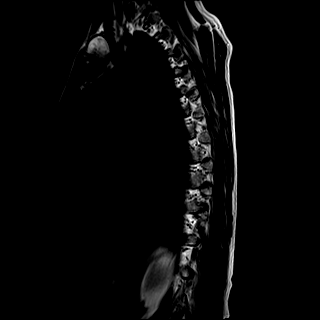
[im 9/17]
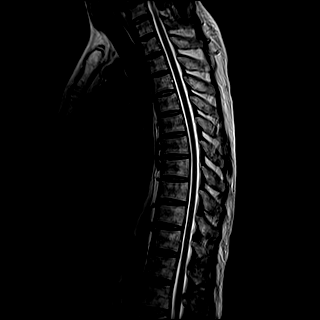
[im 17/17]
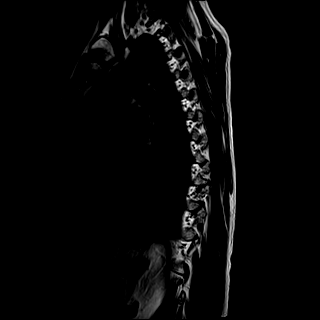

[Series 24: T1 · sagittal · 3.0mm · 1.06mm/px · 4 of 17 slices shown (2 of 3)]
[im 1/17]
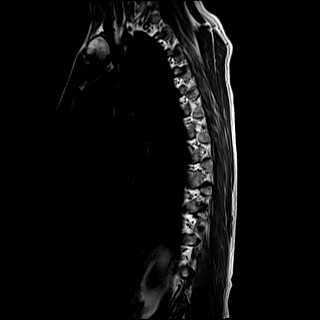
[im 6/17]
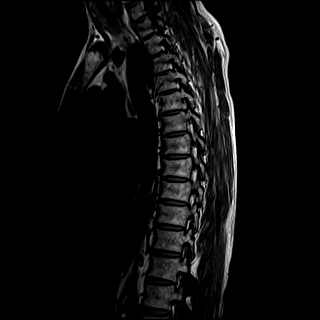
[im 11/17]
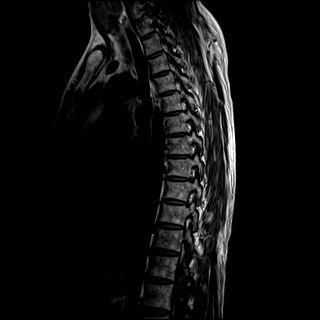
[im 17/17]
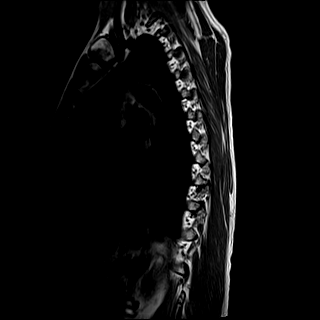

[Series 25: STIR · sagittal · 3.0mm · 0.53mm/px · 1 of 17 slices shown]
[im 1/17]
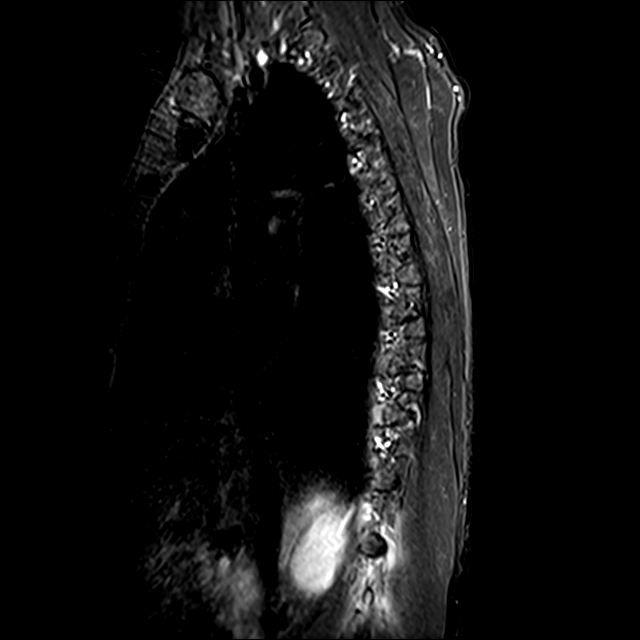

[Series 26: T2 · axial · 4.0mm · 0.59mm/px · z∈[-485,-238]mm · 8 of 39 slices shown (2 of 2)]
[im 1/39]
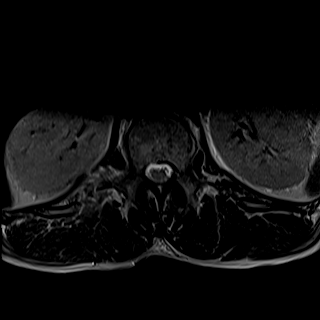
[im 6/39]
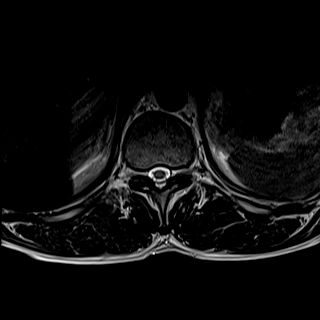
[im 11/39]
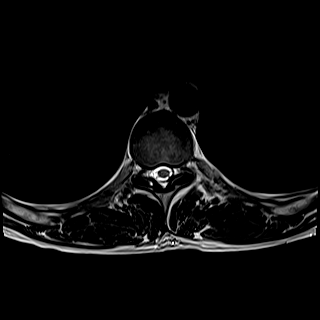
[im 17/39]
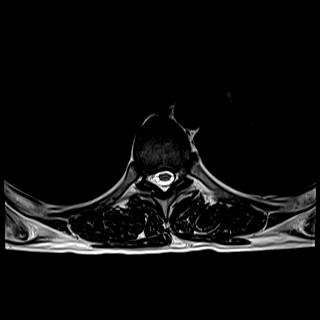
[im 22/39]
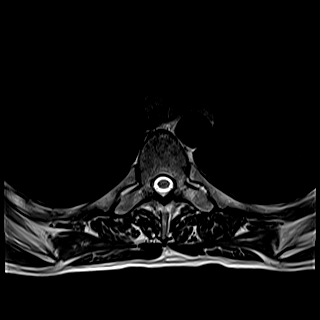
[im 28/39]
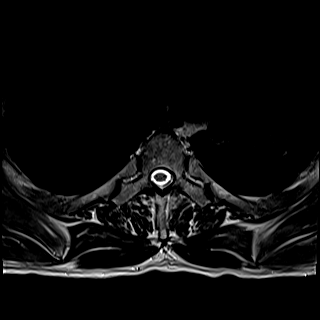
[im 33/39]
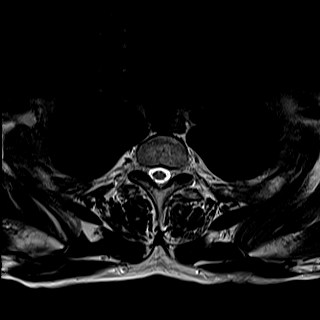
[im 39/39]
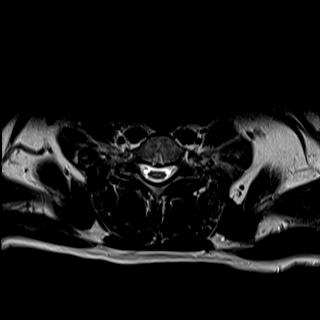

[Series 28: T1 · axial · non-contrast · 4.0mm · 0.37mm/px · z∈[-485,-238]mm · 8 of 39 slices shown (3 of 3)]
[im 1/39]
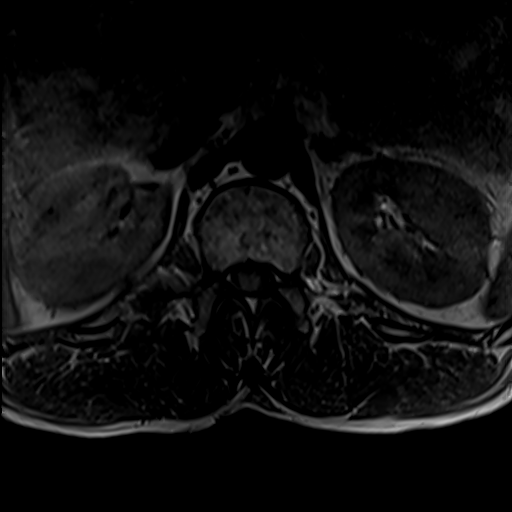
[im 6/39]
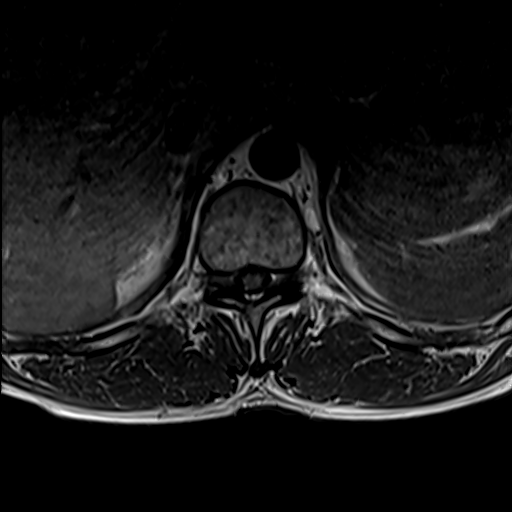
[im 11/39]
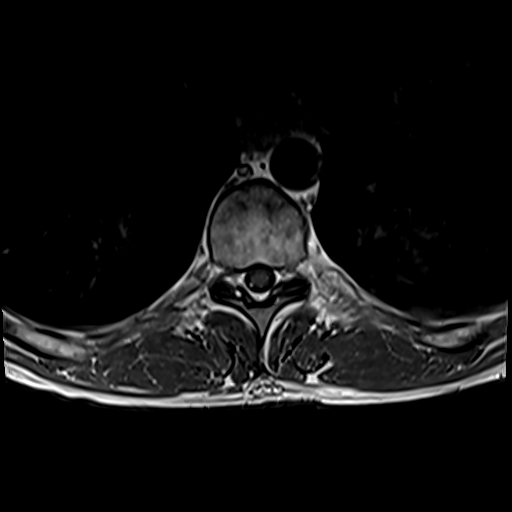
[im 17/39]
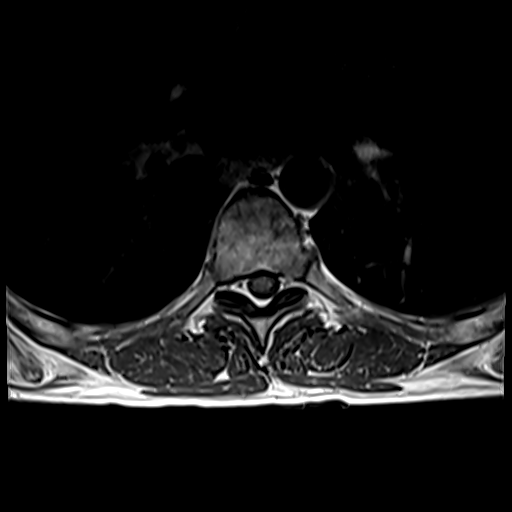
[im 22/39]
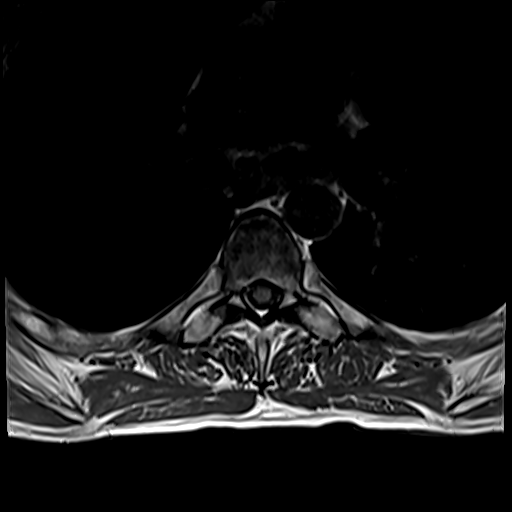
[im 28/39]
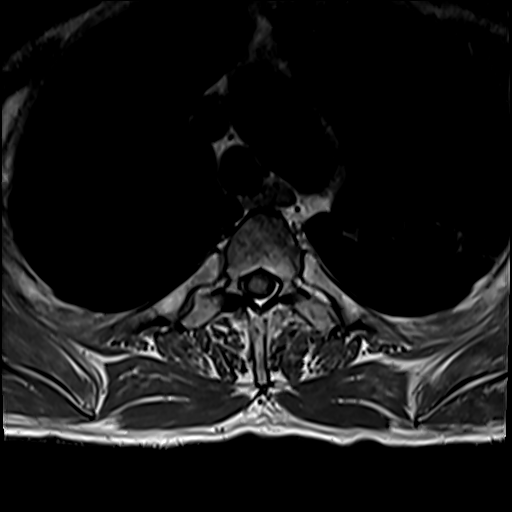
[im 33/39]
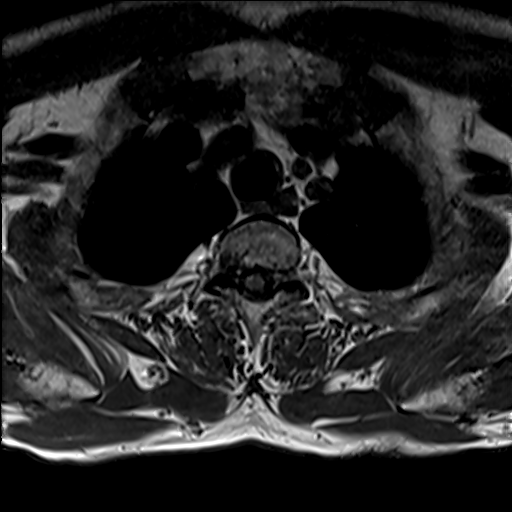
[im 39/39]
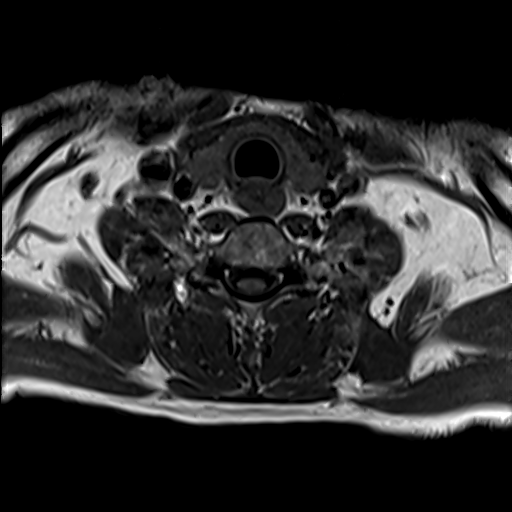

[Series 30: T1 fat-sat post-contrast · sagittal · 3.0mm · 1.06mm/px · 4 of 17 slices shown]
[im 1/17]
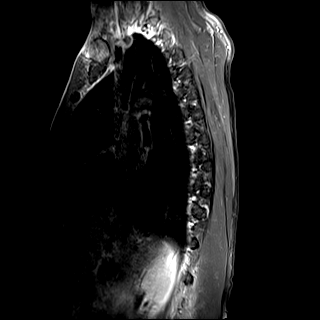
[im 6/17]
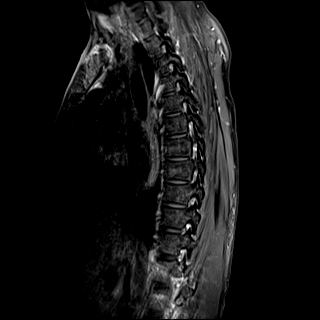
[im 11/17]
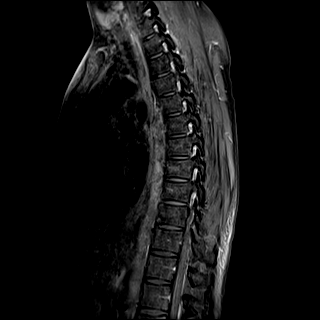
[im 17/17]
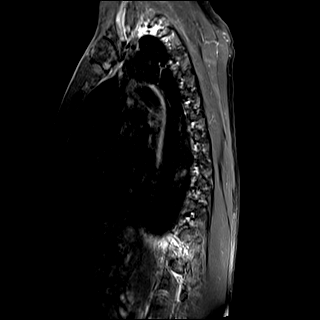

[29 of 48 positions shown; findings below may reference images not displayed]

FINDINGS: MRI CERVICAL SPINE FINDINGS

Alignment: Normal

Vertebrae: No bone lesions or fractures.

Cord: Significant cord compression at C4-5 with associated cord
edema. No cord lesions.

Posterior Fossa, vertebral arteries, paraspinal tissues: No
significant findings.

Disc levels:

C2-3: No significant findings.

C3-4: Moderate-sized broad-based disc protrusion with left
paracentral focality. Significant mass effect on the thecal sac with
flattening and slight deforming of the anterior cervical cord. Mild
foraminal stenosis bilaterally also.

C4-5: Large central and right paracentral disc protrusion with
significant mass effect on the cervical spinal cord which is
markedly flattened and demonstrates increased T2 signal intensity
which could be edema or myelomalacia. Mild foraminal stenosis
bilaterally.

C5-6: Moderate-sized central and left paracentral disc protrusion
with mass effect on the thecal sac and flattening of the cervical
spinal cord. Moderate bilateral foraminal stenosis also.

C6-7: Diffuse bulging degenerated annulus and small right
paracentral annular rent. There is flattening of the ventral thecal
sac and narrowing the ventral CSF space. No significant foraminal
stenosis.

C7-T1: Mild central disc bulge but no significant spinal or
foraminal stenosis.

MRI THORACIC SPINE FINDINGS

Alignment:  Normal

Vertebrae: Normal marrow signal.  No bone lesions or fractures.

Cord:  Normal cord signal intensity.  No cord lesions or syrinx.

Paraspinal and other soft tissues: No significant paraspinal
findings.

Disc levels:

No thoracic disc protrusions, spinal or foraminal stenosis.
IMPRESSION: 1. Significant multilevel disc protrusions in the cervical spine.
Significant canal stenosis and cord impingement most severe at C4-5.
Associated abnormal T2 signal intensity in the cord at this level
could be edema or myelomalacia.
2. Mild to moderate bilateral foraminal stenosis at C3-4, C4-5 and
C5-6.
3. Unremarkable thoracic spine MRI examination.

## 2022-03-28 IMAGING — MR MR HEAD WO/W CM
15 series · 48 of 48 positions shown · IV contrast (gadavist)
Comparison: Head CT [DATE].

CLINICAL DATA: Transient ischemic attack (TIA).

EXAM:
MRI HEAD WITHOUT AND WITH CONTRAST
TECHNIQUE: Multiplanar, multiecho pulse sequences of the brain and surrounding
structures were obtained without and with intravenous contrast.
CONTRAST:  7mL GADAVIST GADOBUTROL 1 MMOL/ML IV SOLN

[Series 5: ax dwi_tracew · axial · 3.0mm · 0.65mm/px · z∈[-106,+48]mm · 3 of 48 slices shown]
[im 1/48]
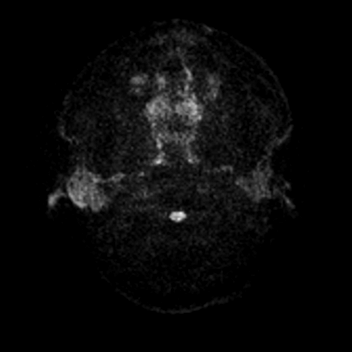
[im 24/48]
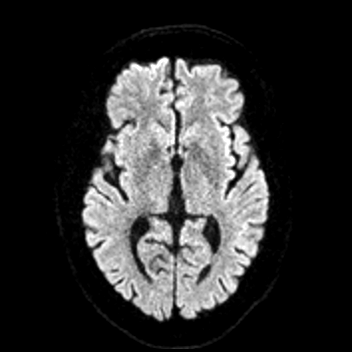
[im 48/48]
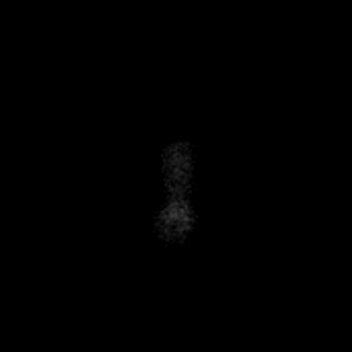

[Series 6: ax dwi_adc · axial · 3.0mm · 0.65mm/px · z∈[-106,+48]mm · 3 of 48 slices shown]
[im 1/48]
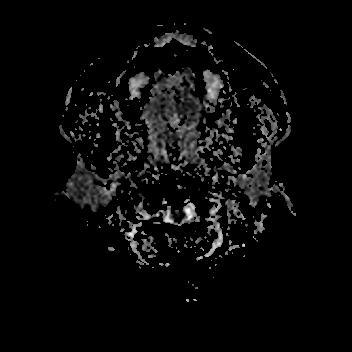
[im 24/48]
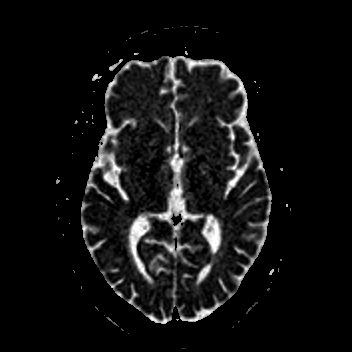
[im 48/48]
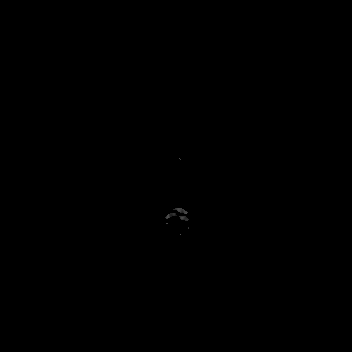

[Series 7: cor dwi_tracew · coronal · 5.0mm · 0.65mm/px · 2 of 40 slices shown]
[im 1/40]
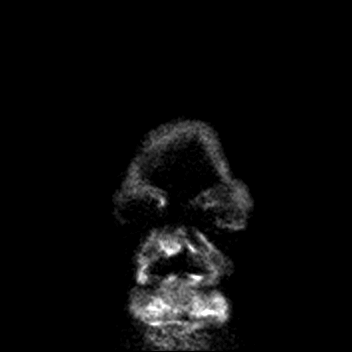
[im 40/40]
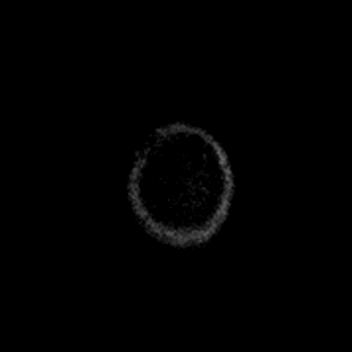

[Series 8: cor dwi_adc · coronal · 5.0mm · 0.65mm/px · 2 of 39 slices shown]
[im 1/39]
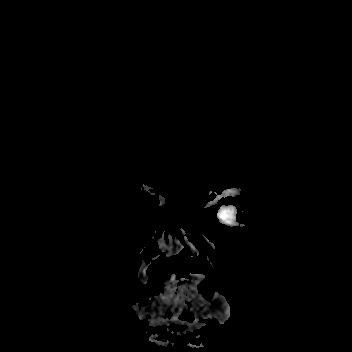
[im 39/39]
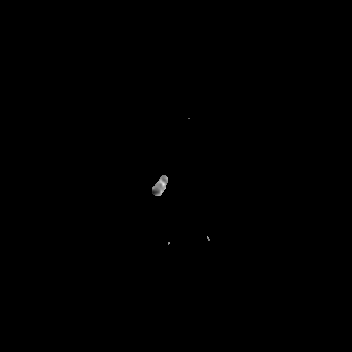

[Series 9: T1 · sagittal · 5.0mm · 0.62mm/px · 1 of 21 slices shown (1 of 2)]
[im 1/21]
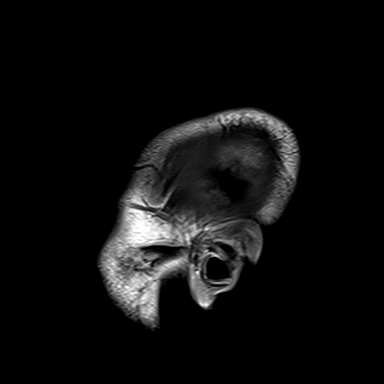

[Series 10: T2 · axial · 5.0mm · 0.53mm/px · 1 of 25 slices shown]
[im 1/25]
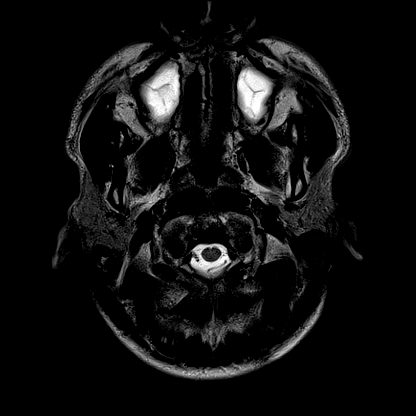

[Series 11: mag_images · axial · 3.0mm · 0.90mm/px · z∈[-116,+59]mm · 3 of 60 slices shown]
[im 1/60]
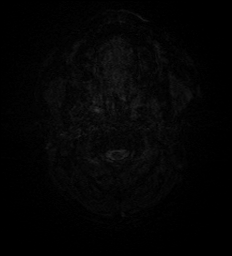
[im 30/60]
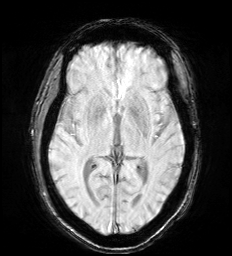
[im 60/60]
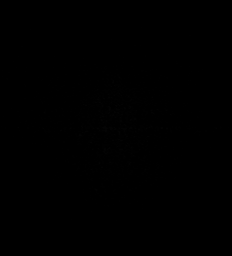

[Series 12: pha_images · axial · 3.0mm · 0.90mm/px · z∈[-116,+56]mm · 3 of 59 slices shown]
[im 1/59]
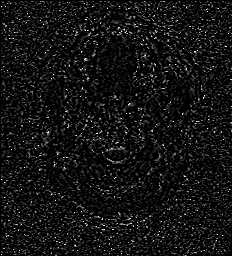
[im 30/59]
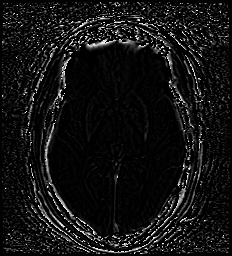
[im 59/59]
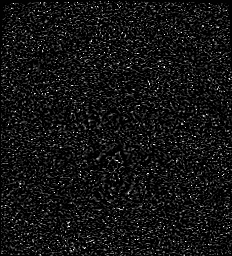

[Series 13: swi_images · axial · 3.0mm · 0.90mm/px · z∈[-116,+59]mm · 3 of 60 slices shown]
[im 1/60]
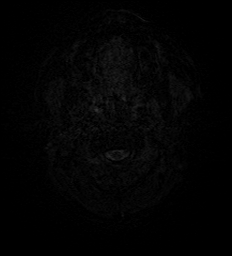
[im 30/60]
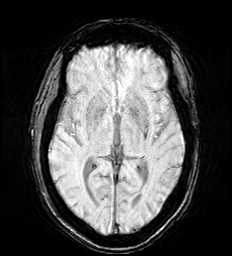
[im 60/60]
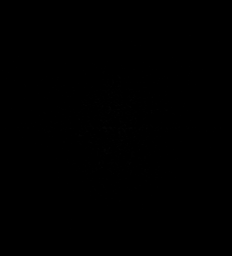

[Series 15: FLAIR · axial · 3.0mm · 0.53mm/px · z∈[-109,+52]mm · 3 of 55 slices shown]
[im 1/55]
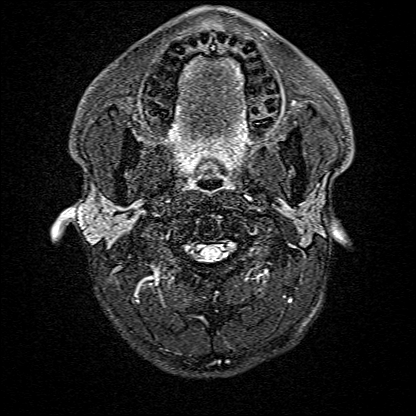
[im 28/55]
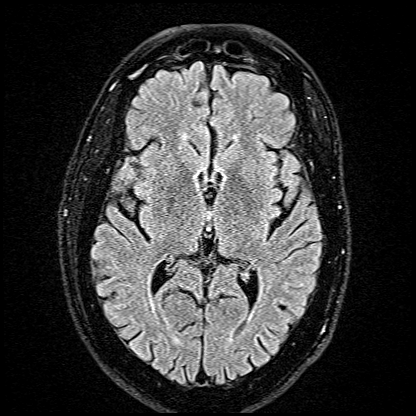
[im 55/55]
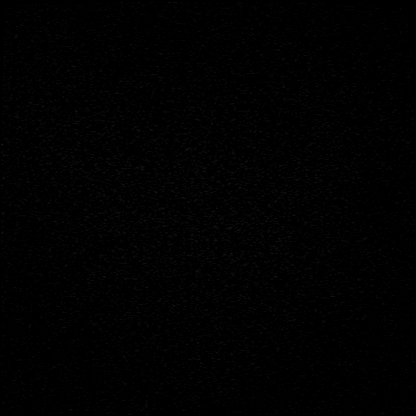

[Series 16: T1 · axial · 1.0mm · 0.98mm/px · z∈[-117,+57]mm · 10 of 176 slices shown (2 of 2)]
[im 1/176]
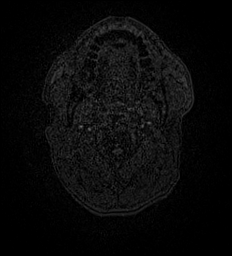
[im 20/176]
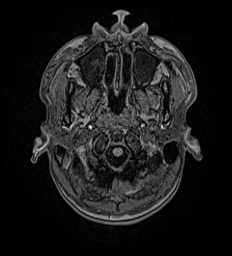
[im 39/176]
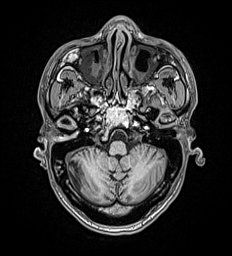
[im 59/176]
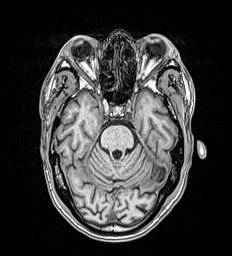
[im 78/176]
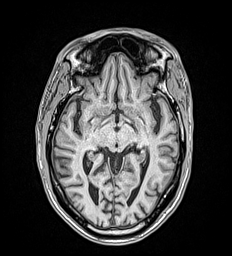
[im 98/176]
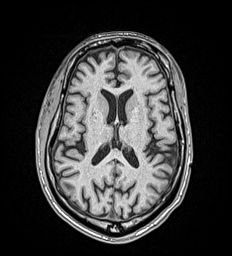
[im 117/176]
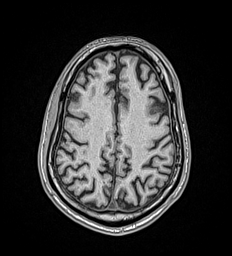
[im 137/176]
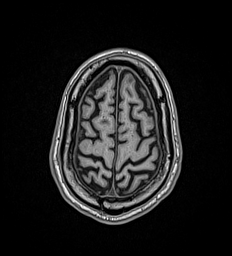
[im 156/176]
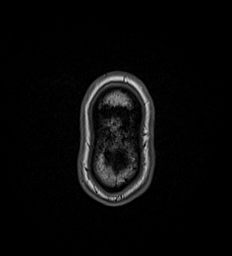
[im 176/176]
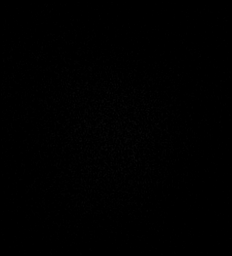

[Series 17: T2 post-contrast · coronal · 5.0mm · 0.57mm/px · 2 of 29 slices shown]
[im 1/29]
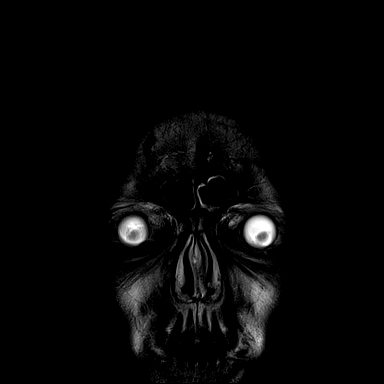
[im 29/29]
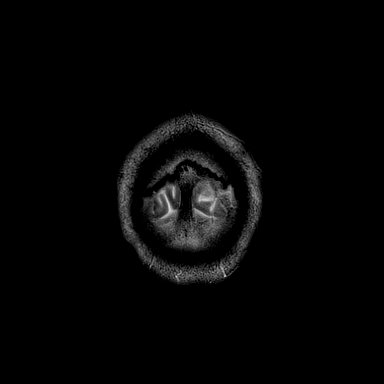

[Series 18: T1 post-contrast · axial · 1.0mm · 0.98mm/px · z∈[-117,+57]mm · 9 of 174 slices shown (1 of 3)]
[im 1/174]
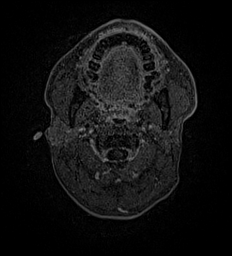
[im 22/174]
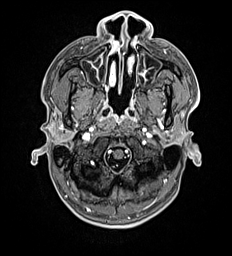
[im 44/174]
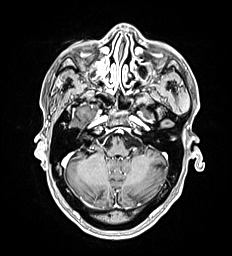
[im 65/174]
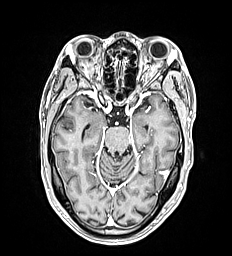
[im 87/174]
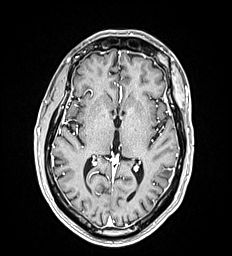
[im 109/174]
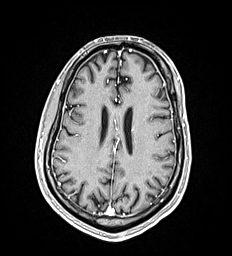
[im 130/174]
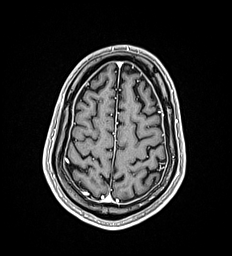
[im 152/174]
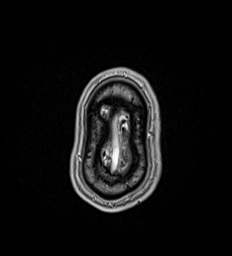
[im 174/174]
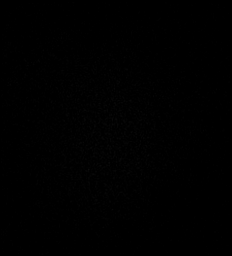

[Series 19: T1 post-contrast · coronal · 5.0mm · 0.57mm/px · 2 of 29 slices shown (2 of 3)]
[im 1/29]
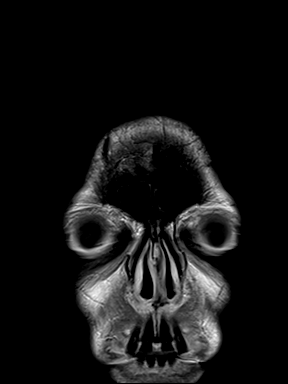
[im 29/29]
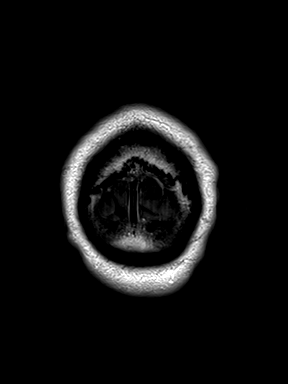

[Series 20: T1 post-contrast · sagittal · 5.0mm · 0.62mm/px · 1 of 21 slices shown (3 of 3)]
[im 1/21]
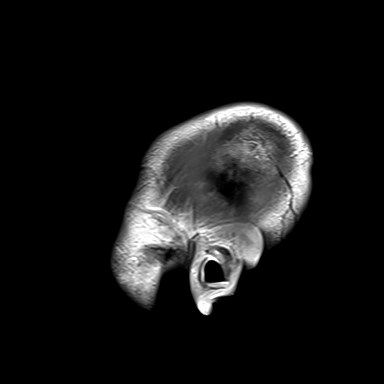

[48 of 48 positions shown; findings below may reference images not displayed]

FINDINGS: Brain: No acute infarction, hemorrhage, hydrocephalus, extra-axial
collection or mass lesion. No focus of abnormal contrast enhancement
identified.

Vascular: Normal flow voids.

Skull and upper cervical spine: Normal marrow signal.

Sinuses/Orbits: Mucosal thickening throughout the paranasal sinuses,
more pronounced in the bilateral maxillary sinuses where there is
fluid level bilateral. Mild mucosal thickening of the bilateral
mastoid cells. The orbits are maintained.

Other: None.
IMPRESSION: 1. No acute intracranial abnormality.
2. Unremarkable MRI of the brain.

## 2022-03-28 IMAGING — MR MR CERVICAL SPINE WO/W CM
5 of 8 series · 28 of 48 positions shown · non-contrast
Comparison: Neck CT [DATE]

CLINICAL DATA: Nontraumatic ataxia. Three year history of worsening
gait disturbance, hand weakness and leg numbness.

EXAM:
MRI CERVICAL AND THORACIC SPINE WITHOUT CONTRAST
TECHNIQUE: Multiplanar and multiecho pulse sequences of the cervical spine, to
include the craniocervical junction and cervicothoracic junction,
and the thoracic spine, were obtained without intravenous contrast.

[Series 20: T2 · sagittal · 3.0mm · 0.62mm/px · 4 of 15 slices shown (1 of 2)]
[im 1/15]
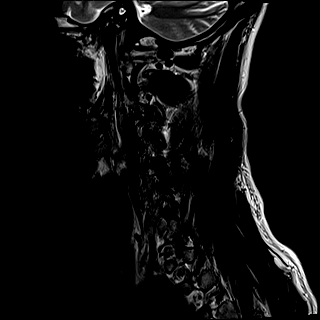
[im 5/15]
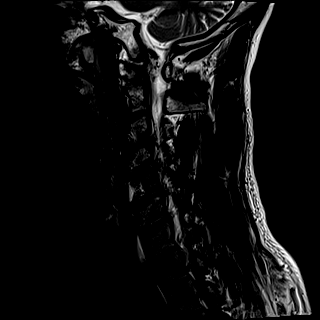
[im 10/15]
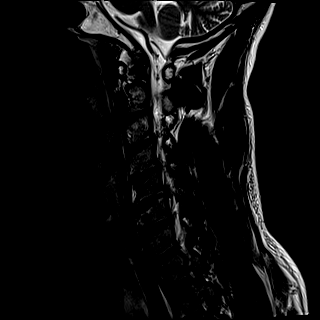
[im 15/15]
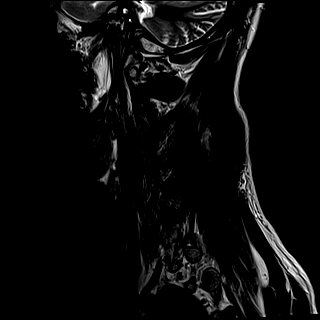

[Series 22: STIR · sagittal · 3.0mm · 0.62mm/px · 4 of 15 slices shown]
[im 1/15]
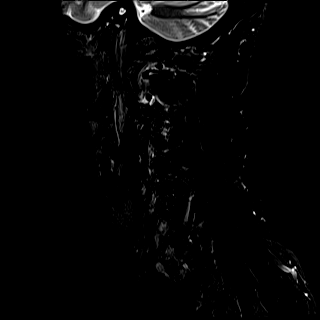
[im 5/15]
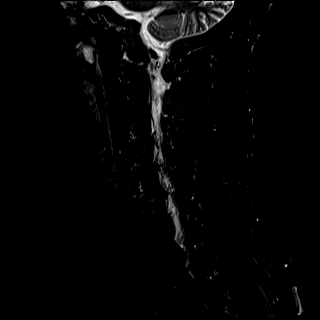
[im 10/15]
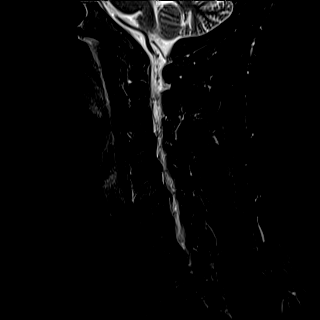
[im 15/15]
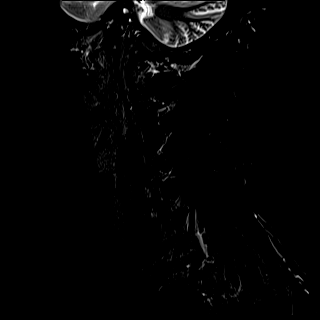

[Series 23: T2 · axial · 3.0mm · 0.70mm/px · z∈[-236,-140]mm · 8 of 29 slices shown (2 of 2)]
[im 1/29]
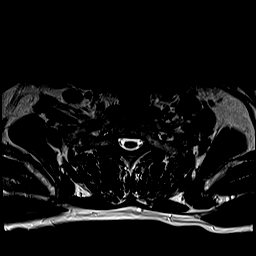
[im 5/29]
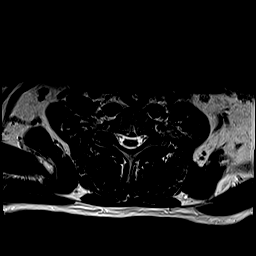
[im 9/29]
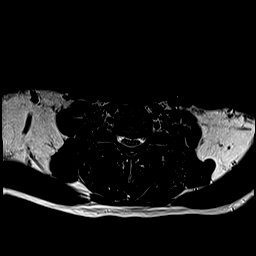
[im 13/29]
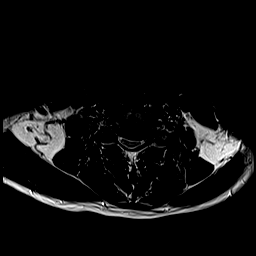
[im 17/29]
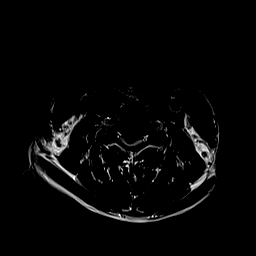
[im 21/29]
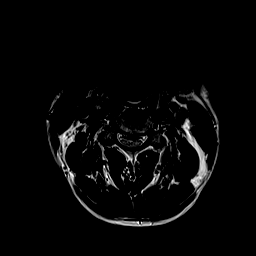
[im 25/29]
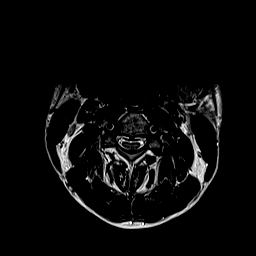
[im 29/29]
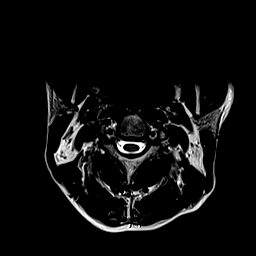

[Series 25: T1 · axial · non-contrast · 3.0mm · 0.35mm/px · z∈[-236,-140]mm · 8 of 29 slices shown]
[im 1/29]
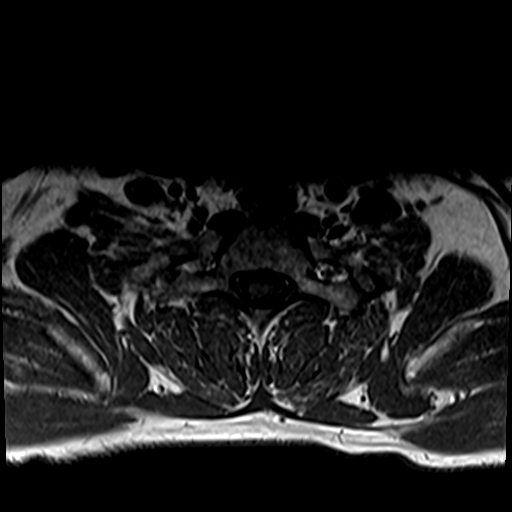
[im 5/29]
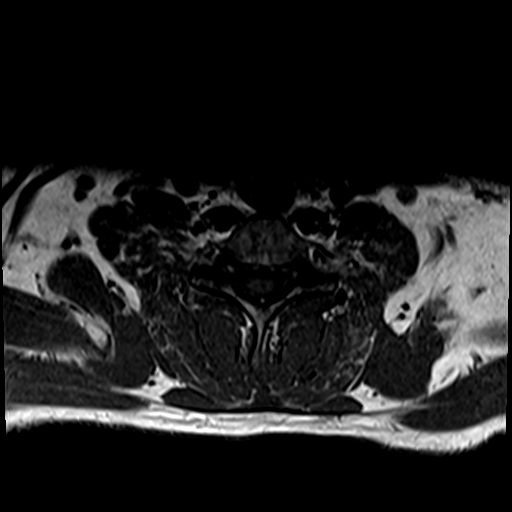
[im 9/29]
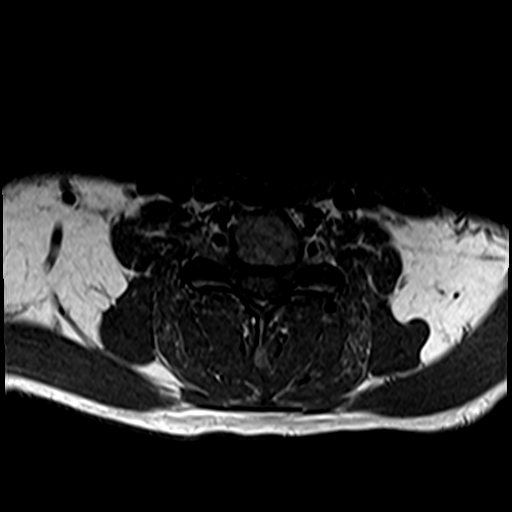
[im 13/29]
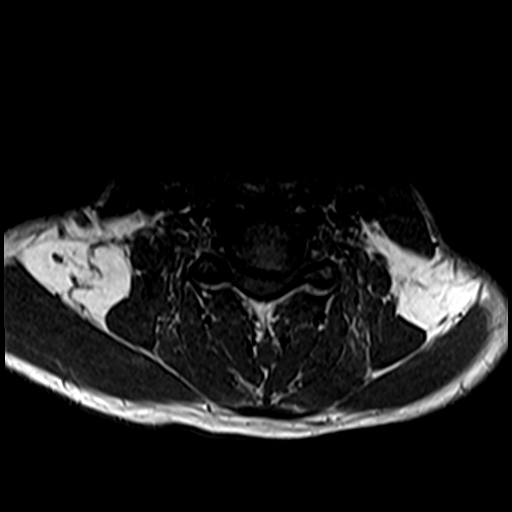
[im 17/29]
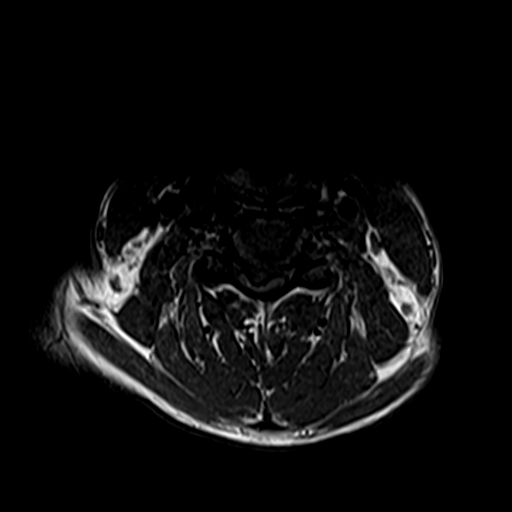
[im 21/29]
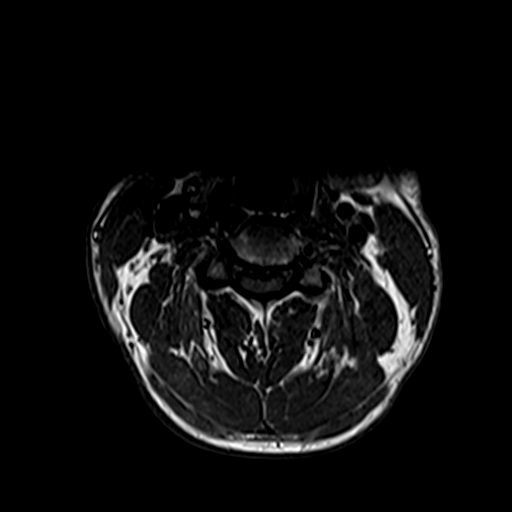
[im 25/29]
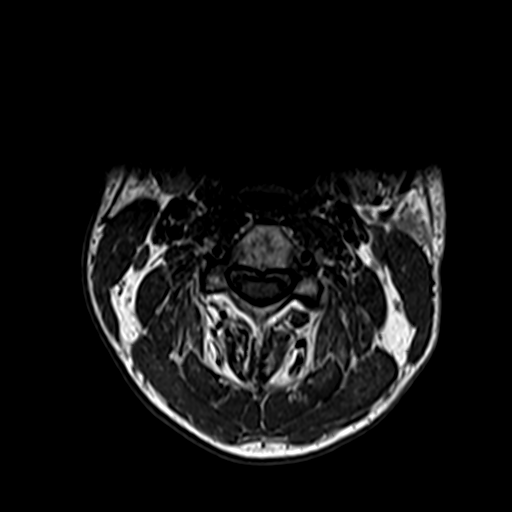
[im 29/29]
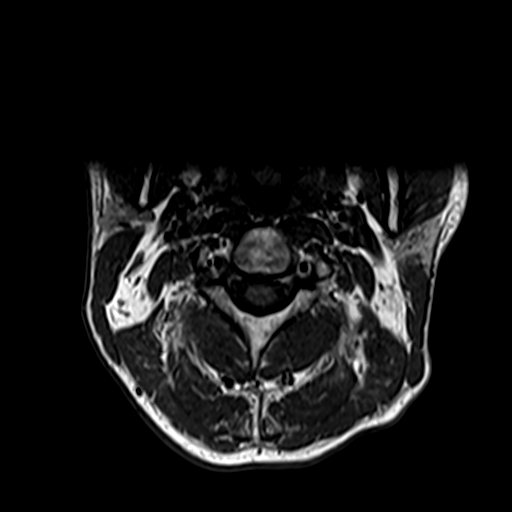

[Series 27: T1 post-contrast · axial · 3.0mm · 0.35mm/px · z∈[-236,-195]mm · 4 of 29 slices shown]
[im 1/29]
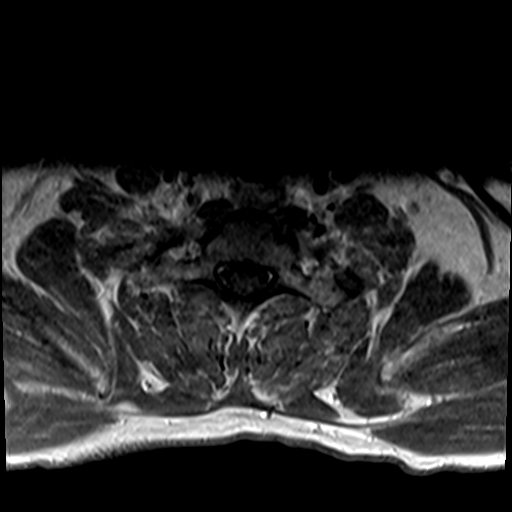
[im 5/29]
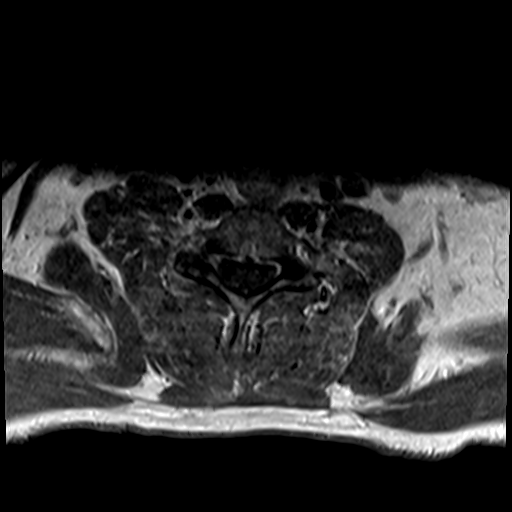
[im 9/29]
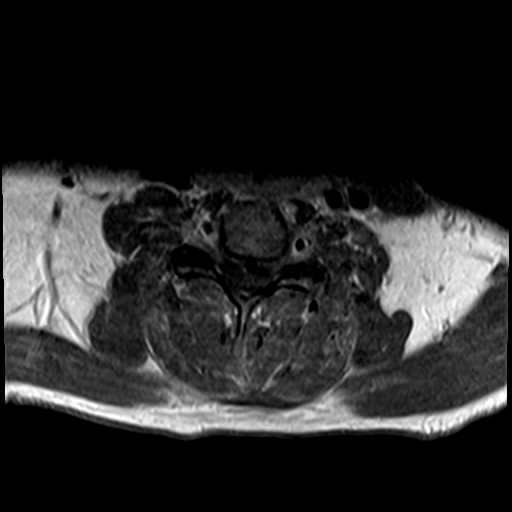
[im 13/29]
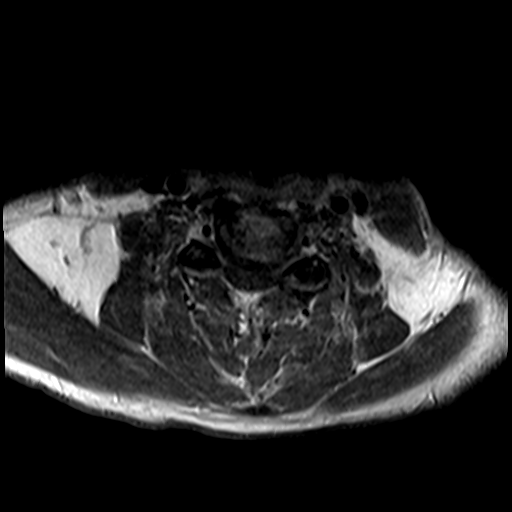

[28 of 48 positions shown; findings below may reference images not displayed]

FINDINGS: MRI CERVICAL SPINE FINDINGS

Alignment: Normal

Vertebrae: No bone lesions or fractures.

Cord: Significant cord compression at C4-5 with associated cord
edema. No cord lesions.

Posterior Fossa, vertebral arteries, paraspinal tissues: No
significant findings.

Disc levels:

C2-3: No significant findings.

C3-4: Moderate-sized broad-based disc protrusion with left
paracentral focality. Significant mass effect on the thecal sac with
flattening and slight deforming of the anterior cervical cord. Mild
foraminal stenosis bilaterally also.

C4-5: Large central and right paracentral disc protrusion with
significant mass effect on the cervical spinal cord which is
markedly flattened and demonstrates increased T2 signal intensity
which could be edema or myelomalacia. Mild foraminal stenosis
bilaterally.

C5-6: Moderate-sized central and left paracentral disc protrusion
with mass effect on the thecal sac and flattening of the cervical
spinal cord. Moderate bilateral foraminal stenosis also.

C6-7: Diffuse bulging degenerated annulus and small right
paracentral annular rent. There is flattening of the ventral thecal
sac and narrowing the ventral CSF space. No significant foraminal
stenosis.

C7-T1: Mild central disc bulge but no significant spinal or
foraminal stenosis.

MRI THORACIC SPINE FINDINGS

Alignment:  Normal

Vertebrae: Normal marrow signal.  No bone lesions or fractures.

Cord:  Normal cord signal intensity.  No cord lesions or syrinx.

Paraspinal and other soft tissues: No significant paraspinal
findings.

Disc levels:

No thoracic disc protrusions, spinal or foraminal stenosis.
IMPRESSION: 1. Significant multilevel disc protrusions in the cervical spine.
Significant canal stenosis and cord impingement most severe at C4-5.
Associated abnormal T2 signal intensity in the cord at this level
could be edema or myelomalacia.
2. Mild to moderate bilateral foraminal stenosis at C3-4, C4-5 and
C5-6.
3. Unremarkable thoracic spine MRI examination.

## 2022-03-28 MED ORDER — GABAPENTIN 100 MG PO CAPS
100.0000 mg | ORAL_CAPSULE | Freq: Three times a day (TID) | ORAL | Status: DC
  Administered 2022-03-28 (×2): 100 mg via ORAL
  Filled 2022-03-28 (×2): qty 1

## 2022-03-28 MED ORDER — GADOBUTROL 1 MMOL/ML IV SOLN
7.0000 mL | Freq: Once | INTRAVENOUS | Status: AC | PRN
  Administered 2022-03-28: 7 mL via INTRAVENOUS

## 2022-03-28 NOTE — ED Notes (Signed)
PT in room with pt

## 2022-03-28 NOTE — Progress Notes (Signed)
Physical Therapy Treatment Patient Details Name: Lance Green MRN: [id number] DOB: April 20, 1976 Today's Date: 03/28/2022   History of Present Illness Lance Green is a 46 y.o. male past medical history of alcohol use disorder and neuropathy presents with difficulty ambulating falls.  Patient has long history of peripheral neuropathy.  Tells me he has difficulty feeling his hands and feet this has been longstanding.  He is currently homeless.  He was seen here just the other day and given a walker. PMH includes: ETOH, pancreatitis    PT Comments    Pt supine in bed. He is pleasant and motivated; doubtful of ability to ambulate but agreeable. Bed mobility required additional time and effort; no physical assistance required. He was able to stand from EOB with MIN A for heavy steadying upon initial stand, ~30-60 seconds. Once tremors/jerking subsided, pt ambulated 109ft within room. He was nervous to use RW due to recent fall (fell over the front of the RW); PT reassured. Onset of LE tremors occur occasionally during ambulation. PT instructed pt to stop and wait for tremors to clear. Suspect this is how pt fell over the front of the RW prior to admission (feet slow down however upper body and trunk keep moving). PT encouraged use of toilet or BSC; RN aware. D/c recs remain appropriate. Would benefit from skilled PT to address above deficits and promote optimal return to PLOF.    Recommendations for follow up therapy are one component of a multi-disciplinary discharge planning process, led by the attending physician.  Recommendations may be updated based on patient status, additional functional criteria and insurance authorization.  Follow Up Recommendations  Skilled nursing-short term rehab (<3 hours/day)     Assistance Recommended at Discharge Frequent or constant Supervision/Assistance  Patient can return home with the following Two people to help with walking and/or transfers;A lot of help  with bathing/dressing/bathroom;Assistance with feeding;Help with stairs or ramp for entrance;Assist for transportation;Assistance with cooking/housework   Equipment Recommendations  None recommended by PT;Other (comment) (TBD)    Recommendations for Other Services       Precautions / Restrictions Precautions Precautions: Fall Restrictions Weight Bearing Restrictions: No     Mobility  Bed Mobility Overal bed mobility: Needs Assistance Bed Mobility: Supine to Sit, Sit to Supine     Supine to sit: Supervision, HOB elevated Sit to supine: Supervision   General bed mobility comments: SUP for safety with increased time and effort; no physical assist provided    Transfers Overall transfer level: Needs assistance Equipment used: Rolling walker (2 wheels) Transfers: Sit to/from Stand Sit to Stand: Min assist, From elevated surface           General transfer comment: MIN A for heavy steadying upon initial stand due to tremors/jerking in BLE; x3 reps    Ambulation/Gait Ambulation/Gait assistance: Min guard Gait Distance (Feet): 50 Feet Assistive device: Rolling walker (2 wheels) Gait Pattern/deviations: Step-through pattern, Decreased stride length, Decreased step length - right, Decreased step length - left, Trunk flexed Gait velocity: decreased     General Gait Details: Pt ambukated within room; multiple 180d turns performed to remain within room. Occasional bouts of tremors during ambulation - pt was instructed to stand still until subsided.   Stairs             Wheelchair Mobility    Modified Rankin (Stroke Patients Only)       Balance Overall balance assessment: Needs assistance, History of Falls Sitting-balance support: Feet supported Sitting balance-Leahy Scale:  Good     Standing balance support: Bilateral upper extremity supported, During functional activity, Reliant on assistive device for balance Standing balance-Leahy Scale: Poor Standing  balance comment: Heavy steadying required at times; other times did progress to light CGA. Hands on at all times for safety.                            Cognition Arousal/Alertness: Awake/alert Behavior During Therapy: WFL for tasks assessed/performed Overall Cognitive Status: Within Functional Limits for tasks assessed                                 General Comments: Pt is very pleasant and cooperative        Exercises      General Comments        Pertinent Vitals/Pain Pain Assessment Pain Assessment: No/denies pain    Home Living                          Prior Function            PT Goals (current goals can now be found in the care plan section) Acute Rehab PT Goals Patient Stated Goal: to get more help. PT Goal Formulation: With patient Time For Goal Achievement: 04/10/22 Potential to Achieve Goals: Fair    Frequency    Min 2X/week      PT Plan      Co-evaluation              AM-PAC PT "6 Clicks" Mobility   Outcome Measure  Help needed turning from your back to your side while in a flat bed without using bedrails?: A Little Help needed moving from lying on your back to sitting on the side of a flat bed without using bedrails?: A Little Help needed moving to and from a bed to a chair (including a wheelchair)?: A Little Help needed standing up from a chair using your arms (e.g., wheelchair or bedside chair)?: A Little Help needed to walk in hospital room?: A Little Help needed climbing 3-5 steps with a railing? : Total 6 Click Score: 16    End of Session Equipment Utilized During Treatment: Gait belt Activity Tolerance: Patient tolerated treatment well Patient left: in chair;with call bell/phone within reach Nurse Communication: Mobility status PT Visit Diagnosis: Unsteadiness on feet (R26.81);Repeated falls (R29.6);Muscle weakness (generalized) (M62.81);Other abnormalities of gait and mobility  (R26.89);Difficulty in walking, not elsewhere classified (R26.2);Other symptoms and signs involving the nervous system (N56.213)     Time: 0865-7846 PT Time Calculation (min) (ACUTE ONLY): 21 min  Charges:  $Therapeutic Activity: 8-22 mins                     Basilia Jumbo PT, DPT

## 2022-03-29 ENCOUNTER — Encounter: Payer: Self-pay | Admitting: Internal Medicine

## 2022-03-29 DIAGNOSIS — G9520 Unspecified cord compression: Secondary | ICD-10-CM

## 2022-03-29 LAB — CBC
HCT: 33.2 % — ABNORMAL LOW (ref 39.0–52.0)
Hemoglobin: 11.3 g/dL — ABNORMAL LOW (ref 13.0–17.0)
MCH: 31.9 pg (ref 26.0–34.0)
MCHC: 34 g/dL (ref 30.0–36.0)
MCV: 93.8 fL (ref 80.0–100.0)
Platelets: 148 10*3/uL — ABNORMAL LOW (ref 150–400)
RBC: 3.54 MIL/uL — ABNORMAL LOW (ref 4.22–5.81)
RDW: 11.9 % (ref 11.5–15.5)
WBC: 7.4 10*3/uL (ref 4.0–10.5)
nRBC: 0 % (ref 0.0–0.2)

## 2022-03-29 LAB — GLUCOSE, CAPILLARY
Glucose-Capillary: 149 mg/dL — ABNORMAL HIGH (ref 70–99)
Glucose-Capillary: 135 mg/dL — ABNORMAL HIGH (ref 70–99)
Glucose-Capillary: 139 mg/dL — ABNORMAL HIGH (ref 70–99)

## 2022-03-29 LAB — BASIC METABOLIC PANEL
Anion gap: 5 (ref 5–15)
BUN: 7 mg/dL (ref 6–20)
CO2: 24 mmol/L (ref 22–32)
Calcium: 9.1 mg/dL (ref 8.9–10.3)
Chloride: 110 mmol/L (ref 98–111)
Creatinine, Ser: 0.7 mg/dL (ref 0.61–1.24)
GFR, Estimated: >60 mL/min (ref >60)
Glucose, Bld: 140 mg/dL — ABNORMAL HIGH (ref 70–99)
Potassium: 3.2 mmol/L — ABNORMAL LOW (ref 3.5–5.1)
Sodium: 139 mmol/L (ref 135–145)

## 2022-03-29 LAB — SURGICAL PCR SCREEN
MRSA, PCR: POSITIVE — AB
Staphylococcus aureus: POSITIVE — AB

## 2022-03-29 LAB — HIV ANTIBODY (ROUTINE TESTING W REFLEX): HIV Screen 4th Generation wRfx: NONREACTIVE

## 2022-03-29 MED ORDER — HYDRALAZINE HCL 20 MG/ML IJ SOLN
5.0000 mg | INTRAMUSCULAR | Status: DC | PRN
Start: 2022-03-29 — End: 2022-04-09
  Administered 2022-04-02 – 2022-04-03 (×2): 5 mg via INTRAVENOUS
  Filled 2022-03-29 (×2): qty 1

## 2022-03-29 MED ORDER — ADULT MULTIVITAMIN W/MINERALS CH
1.0000 | ORAL_TABLET | Freq: Every day | ORAL | Status: DC
  Administered 2022-03-29 – 2022-04-09 (×11): 1 via ORAL
  Filled 2022-03-29 (×11): qty 1

## 2022-03-29 MED ORDER — CHLORHEXIDINE GLUCONATE CLOTH 2 % EX PADS
6.0000 | MEDICATED_PAD | Freq: Every day | CUTANEOUS | Status: DC
  Administered 2022-03-29: 6 via TOPICAL

## 2022-03-29 MED ORDER — ACETAMINOPHEN 650 MG RE SUPP: 650.0000 mg | Freq: Four times a day (QID) | RECTAL | Status: DC | PRN

## 2022-03-29 MED ORDER — MUPIROCIN 2 % EX OINT
1.0000 | TOPICAL_OINTMENT | Freq: Two times a day (BID) | CUTANEOUS | Status: AC
  Administered 2022-03-29 – 2022-04-02 (×10): 1 via NASAL
  Filled 2022-03-29: qty 22

## 2022-03-29 MED ORDER — POTASSIUM CHLORIDE CRYS ER 20 MEQ PO TBCR
40.0000 meq | EXTENDED_RELEASE_TABLET | Freq: Two times a day (BID) | ORAL | Status: AC
  Administered 2022-03-29 – 2022-03-30 (×2): 40 meq via ORAL
  Filled 2022-03-29 (×2): qty 2

## 2022-03-29 MED ORDER — THIAMINE HCL 100 MG/ML IJ SOLN
100.0000 mg | Freq: Every day | INTRAMUSCULAR | Status: DC
  Administered 2022-03-29 – 2022-04-03 (×3): 100 mg via INTRAVENOUS
  Filled 2022-03-29 (×5): qty 2

## 2022-03-29 MED ORDER — THIAMINE HCL 100 MG PO TABS
100.0000 mg | ORAL_TABLET | Freq: Every day | ORAL | Status: DC
Start: 2022-03-29 — End: 2022-04-09
  Administered 2022-03-30 – 2022-04-09 (×9): 100 mg via ORAL
  Filled 2022-03-29 (×11): qty 1

## 2022-03-29 MED ORDER — FOLIC ACID 1 MG PO TABS
1.0000 mg | ORAL_TABLET | Freq: Every day | ORAL | Status: DC
  Administered 2022-03-29 – 2022-04-09 (×11): 1 mg via ORAL
  Filled 2022-03-29 (×11): qty 1

## 2022-03-29 MED ORDER — ACETAMINOPHEN 325 MG PO TABS: 650.0000 mg | ORAL_TABLET | Freq: Four times a day (QID) | ORAL | Status: DC | PRN

## 2022-03-29 MED ORDER — LACTATED RINGERS IV SOLN: INTRAVENOUS | Status: DC

## 2022-03-29 NOTE — Progress Notes (Addendum)
PROGRESS NOTE  [name] UYQ:[id number] DOB: September 20, 1976 DOA: 03/26/2022 PCP: Pcp, No  HPI/Recap of past 24 hours: Lance Green is a 46 y.o. Green who presents with the chief complaint of Decreased hand coordination, balance issues.  This has been going on for more than 3 years.  No trauma.  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  Also problems with balance but no recent falls.  No fevers, chills.  The symptoms are causing a significant impact on the patient's life.  Neurosurgery was consulted.  No plan for neurosurgery procedure as of 03/29/2022, per neurosurgery.  03/29/2022: The patient was seen and examined at his bedside.  Reports having neck pain and bowel incontinence.  Assessment/Plan: Principal Problem:   Cervical cord compression with myelopathy (HCC) Active Problems:   Alcohol abuse   Normocytic anemia  Severe cervical canal stenosis with cord impingement most severe at C4-C5 associated with edema or myelomalacia.   Neurosurgery consulted. No plan for neurosurgical procedure as of 03/29/2022, per neurosurgery.  Rhabdomyolysis after fall  CPK improved with IV fluid.   CPK 416 from 2260 Continue IV fluid hydration.  Alcohol use or concern for alcohol withdrawal. Continue CIWA protocol.   Continue multivitamins, folic acid and thiamine supplements.    4.   Elevated blood pressures, suspect related to his pain.   No history of hypertension. Continue pain control and bowel regimen.   IV antihypertensives as needed with parameters  Continue to closely monitor vital signs.   DVT prophylaxis: SCDs avoiding anticoagulation in anticipation of neurosurgery. Code Status: Full code. Family Communication: Discussed with patient. Disposition Plan: May need rehab. Consults called: Neurosurgery. Admission status: Inpatient.     Status is: Inpatient The patient requires at least 2 midnights for further evaluation and treatment of  present condition.    Objective: Vitals:   03/28/22 2345 03/29/22 0519 03/29/22 0727 03/29/22 1200  BP: (!) 186/88 133/80 135/85 (!) 142/84  Pulse: 93 77 79 69  Resp: 18 18 16 15   Temp: 98.4 F (36.9 C) 98.3 F (36.8 C) 98.8 F (37.1 C) 98.7 F (37.1 C)  TempSrc: Oral Oral    SpO2: 96% 96% 99% 100%  Weight: 60 kg     Height: 5\' 7"  (1.702 m)       Intake/Output Summary (Last 24 hours) at 03/29/2022 1205 Last data filed at 03/29/2022 1007 Gross per 24 hour  Intake 396.4 ml  Output 500 ml  Net -103.6 ml   Filed Weights   03/26/22 2043 03/28/22 2345  Weight: 68 kg 60 kg    Exam:  General: Lance y.o. year-old Green well developed well nourished in no acute distress.  Alert and oriented x3. Cardiovascular: Regular rate and rhythm with no rubs or gallops.  No thyromegaly or JVD noted.   Respiratory: Clear to auscultation with no wheezes or rales. Good inspiratory effort. Abdomen: Soft nontender nondistended with normal bowel sounds x4 quadrants. Musculoskeletal: No lower extremity edema. 2/4 pulses in all 4 extremities. Skin: No ulcerative lesions noted or rashes, Psychiatry: Mood is appropriate for condition and setting   Data Reviewed: CBC: Recent Labs  Lab 03/25/22 0143 03/26/22 0021 03/28/22 2037 03/29/22 0405  WBC 5.7 6.4 7.7 7.4  NEUTROABS 3.1 3.4 4.7  --   HGB 12.6* 13.0 11.9* 11.3*  HCT 36.4* 38.1* 35.1* 33.2*  MCV 93.6 93.6 94.9 93.8  PLT 126* 134* 150 148*   Basic Metabolic Panel: Recent  Labs  Lab 03/25/22 0143 03/26/22 0021 03/28/22 2037 03/29/22 0405  NA 138 143 138 139  K 4.0 3.1* 3.6 3.2*  CL 105 111 105 110  CO2 22 25 23 24   GLUCOSE 94 105* 119* 140*  BUN 7 7 8 7   CREATININE 0.50* 0.63 0.85 0.70  CALCIUM 9.1 9.2 9.2 9.1   GFR: Estimated Creatinine Clearance: 96.9 mL/min (by C-G formula based on SCr of 0.7 mg/dL). Liver Function Tests: Recent Labs  Lab 03/25/22 0143 03/26/22 0021  AST 106* 94*  ALT 57* 49*  ALKPHOS 125 113  BILITOT  0.8 0.5  PROT 7.3 7.3  ALBUMIN 3.6 3.9   No results for input(s): "LIPASE", "AMYLASE" in the last 168 hours. No results for input(s): "AMMONIA" in the last 168 hours. Coagulation Profile: No results for input(s): "INR", "PROTIME" in the last 168 hours. Cardiac Enzymes: Recent Labs  Lab 03/25/22 0143 03/25/22 0440 03/26/22 0021 03/28/22 2037  CKTOTAL 2,261* 1,879* 1,009* 416*   BNP (last 3 results) No results for input(s): "PROBNP" in the last 8760 hours. HbA1C: No results for input(s): "HGBA1C" in the last 72 hours. CBG: Recent Labs  Lab 03/26/22 2214 03/29/22 0642 03/29/22 1154  GLUCAP 87 149* 135*   Lipid Profile: No results for input(s): "CHOL", "HDL", "LDLCALC", "TRIG", "CHOLHDL", "LDLDIRECT" in the last 72 hours. Thyroid Function Tests: No results for input(s): "TSH", "T4TOTAL", "FREET4", "T3FREE", "THYROIDAB" in the last 72 hours. Anemia Panel: No results for input(s): "VITAMINB12", "FOLATE", "FERRITIN", "TIBC", "IRON", "RETICCTPCT" in the last 72 hours. Urine analysis:    Component Value Date/Time   COLORURINE YELLOW (A) 03/25/2022 0126   APPEARANCEUR CLEAR (A) 03/25/2022 0126   APPEARANCEUR Hazy 01/18/2014 1701   LABSPEC 1.009 03/25/2022 0126   LABSPEC 1.013 01/18/2014 1701   PHURINE 5.0 03/25/2022 0126   GLUCOSEU NEGATIVE 03/25/2022 0126   GLUCOSEU Negative 01/18/2014 1701   HGBUR SMALL (A) 03/25/2022 0126   BILIRUBINUR NEGATIVE 03/25/2022 0126   BILIRUBINUR Negative 01/18/2014 1701   KETONESUR NEGATIVE 03/25/2022 0126   PROTEINUR 30 (A) 03/25/2022 0126   NITRITE NEGATIVE 03/25/2022 0126   LEUKOCYTESUR NEGATIVE 03/25/2022 0126   LEUKOCYTESUR Negative 01/18/2014 1701   Sepsis Labs: @LABRCNTIP (procalcitonin:4,lacticidven:4)  ) Recent Results (from the past 240 hour(s))  Resp Panel by RT-PCR (Flu A&B, Covid) Anterior Nasal Swab     Status: None   Collection Time: 03/27/22  6:30 AM   Specimen: Anterior Nasal Swab  Result Value Ref Range Status    SARS Coronavirus 2 by RT PCR NEGATIVE NEGATIVE Final    Comment: (NOTE) SARS-CoV-2 target nucleic acids are NOT DETECTED.  The SARS-CoV-2 RNA is generally detectable in upper respiratory specimens during the acute phase of infection. The lowest concentration of SARS-CoV-2 viral copies this assay can detect is 138 copies/mL. A negative result does not preclude SARS-Cov-2 infection and should not be used as the sole basis for treatment or other patient management decisions. A negative result may occur with  improper specimen collection/handling, submission of specimen other than nasopharyngeal swab, presence of viral mutation(s) within the areas targeted by this assay, and inadequate number of viral copies(<138 copies/mL). A negative result must be combined with clinical observations, patient history, and epidemiological information. The expected result is Negative.  Fact Sheet for Patients:  BloggerCourse.com  Fact Sheet for Healthcare Providers:  SeriousBroker.it  This test is no t yet approved or cleared by the [place] FDA and  has been authorized for detection and/or diagnosis of SARS-CoV-2 by FDA under  an Emergency Use Authorization (EUA). This EUA will remain  in effect (meaning this test can be used) for the duration of the COVID-19 declaration under Section 564(b)(1) of the Act, 21 U.S.C.section 360bbb-3(b)(1), unless the authorization is terminated  or revoked sooner.       Influenza A by PCR NEGATIVE NEGATIVE Final   Influenza B by PCR NEGATIVE NEGATIVE Final    Comment: (NOTE) The Xpert Xpress SARS-CoV-2/FLU/RSV plus assay is intended as an aid in the diagnosis of influenza from Nasopharyngeal swab specimens and should not be used as a sole basis for treatment. Nasal washings and aspirates are unacceptable for Xpert Xpress SARS-CoV-2/FLU/RSV testing.  Fact Sheet for  Patients: BloggerCourse.com  Fact Sheet for Healthcare Providers: SeriousBroker.it  This test is not yet approved or cleared by the [place] FDA and has been authorized for detection and/or diagnosis of SARS-CoV-2 by FDA under an Emergency Use Authorization (EUA). This EUA will remain in effect (meaning this test can be used) for the duration of the COVID-19 declaration under Section 564(b)(1) of the Act, 21 U.S.C. section 360bbb-3(b)(1), unless the authorization is terminated or revoked.  Performed at [facility], [address] [place], [place] [address]   C Difficile Quick Screen w PCR reflex     Status: None   Collection Time: 03/27/22 10:58 AM   Specimen: STOOL  Result Value Ref Range Status   C Diff antigen NEGATIVE NEGATIVE Final   C Diff toxin NEGATIVE NEGATIVE Final   C Diff interpretation No C. difficile detected.  Final    Comment: Performed at [facility], [address], [place], [place] [address]  Surgical PCR screen     Status: Abnormal   Collection Time: 03/29/22  4:00 AM   Specimen: Nasal Mucosa; Nasal Swab  Result Value Ref Range Status   MRSA, PCR POSITIVE (A) NEGATIVE Final    Comment: CRITICAL RESULT CALLED TO, READ BACK BY AND VERIFIED WITH: Beryle Beams RN @ [phone] 03/29/22 LFD    Staphylococcus aureus POSITIVE (A) NEGATIVE Final    Comment: (NOTE) The Xpert SA Assay (FDA approved for NASAL specimens in patients 9 years of age and older), is one component of a comprehensive surveillance program. It is not intended to diagnose infection nor to guide or monitor treatment. Performed at [facility], [address] [place], [place] [address]       Studies: MR Brain W and Wo Contrast  Result Date: 03/28/2022 CLINICAL DATA:  Transient ischemic attack (TIA). EXAM: MRI HEAD WITHOUT AND WITH CONTRAST TECHNIQUE: Multiplanar, multiecho pulse sequences of the brain and  surrounding structures were obtained without and with intravenous contrast. CONTRAST:  7mL GADAVIST GADOBUTROL 1 MMOL/ML IV SOLN COMPARISON:  Head CT March 26, 2022. FINDINGS: Brain: No acute infarction, hemorrhage, hydrocephalus, extra-axial collection or mass lesion. No focus of abnormal contrast enhancement identified. Vascular: Normal flow voids. Skull and upper cervical spine: Normal marrow signal. Sinuses/Orbits: Mucosal thickening throughout the paranasal sinuses, more pronounced in the bilateral maxillary sinuses where there is fluid level bilateral. Mild mucosal thickening of the bilateral mastoid cells. The orbits are maintained. Other: None. IMPRESSION: 1. No acute intracranial abnormality. 2. Unremarkable MRI of the brain. Electronically Signed   By: Baldemar Lenis M.D.   On: 03/28/2022 15:40   MR CERVICAL SPINE W WO CONTRAST  Result Date: 03/28/2022 CLINICAL DATA:  Nontraumatic ataxia. Three year history of worsening gait disturbance, hand weakness and leg numbness. EXAM: MRI CERVICAL AND THORACIC SPINE  WITHOUT CONTRAST TECHNIQUE: Multiplanar and multiecho pulse sequences of the cervical spine, to include the craniocervical junction and cervicothoracic junction, and the thoracic spine, were obtained without intravenous contrast. COMPARISON:  Neck CT 03/26/2022 FINDINGS: MRI CERVICAL SPINE FINDINGS Alignment: Normal Vertebrae: No bone lesions or fractures. Cord: Significant cord compression at C4-5 with associated cord edema. No cord lesions. Posterior Fossa, vertebral arteries, paraspinal tissues: No significant findings. Disc levels: C2-3: No significant findings. C3-4: Moderate-sized broad-based disc protrusion with left paracentral focality. Significant mass effect on the thecal sac with flattening and slight deforming of the anterior cervical cord. Mild foraminal stenosis bilaterally also. C4-5: Large central and right paracentral disc protrusion with significant mass effect on the  cervical spinal cord which is markedly flattened and demonstrates increased T2 signal intensity which could be edema or myelomalacia. Mild foraminal stenosis bilaterally. C5-6: Moderate-sized central and left paracentral disc protrusion with mass effect on the thecal sac and flattening of the cervical spinal cord. Moderate bilateral foraminal stenosis also. C6-7: Diffuse bulging degenerated annulus and small right paracentral annular rent. There is flattening of the ventral thecal sac and narrowing the ventral CSF space. No significant foraminal stenosis. C7-T1: Mild central disc bulge but no significant spinal or foraminal stenosis. MRI THORACIC SPINE FINDINGS Alignment:  Normal Vertebrae: Normal marrow signal.  No bone lesions or fractures. Cord:  Normal cord signal intensity.  No cord lesions or syrinx. Paraspinal and other soft tissues: No significant paraspinal findings. Disc levels: No thoracic disc protrusions, spinal or foraminal stenosis. IMPRESSION: 1. Significant multilevel disc protrusions in the cervical spine. Significant canal stenosis and cord impingement most severe at C4-5. Associated abnormal T2 signal intensity in the cord at this level could be edema or myelomalacia. 2. Mild to moderate bilateral foraminal stenosis at C3-4, C4-5 and C5-6. 3. Unremarkable thoracic spine MRI examination. Electronically Signed   By: Rudie Meyer M.D.   On: 03/28/2022 15:38   MR THORACIC SPINE W WO CONTRAST  Result Date: 03/28/2022 CLINICAL DATA:  Nontraumatic ataxia. Three year history of worsening gait disturbance, hand weakness and leg numbness. EXAM: MRI CERVICAL AND THORACIC SPINE WITHOUT CONTRAST TECHNIQUE: Multiplanar and multiecho pulse sequences of the cervical spine, to include the craniocervical junction and cervicothoracic junction, and the thoracic spine, were obtained without intravenous contrast. COMPARISON:  Neck CT 03/26/2022 FINDINGS: MRI CERVICAL SPINE FINDINGS Alignment: Normal Vertebrae: No  bone lesions or fractures. Cord: Significant cord compression at C4-5 with associated cord edema. No cord lesions. Posterior Fossa, vertebral arteries, paraspinal tissues: No significant findings. Disc levels: C2-3: No significant findings. C3-4: Moderate-sized broad-based disc protrusion with left paracentral focality. Significant mass effect on the thecal sac with flattening and slight deforming of the anterior cervical cord. Mild foraminal stenosis bilaterally also. C4-5: Large central and right paracentral disc protrusion with significant mass effect on the cervical spinal cord which is markedly flattened and demonstrates increased T2 signal intensity which could be edema or myelomalacia. Mild foraminal stenosis bilaterally. C5-6: Moderate-sized central and left paracentral disc protrusion with mass effect on the thecal sac and flattening of the cervical spinal cord. Moderate bilateral foraminal stenosis also. C6-7: Diffuse bulging degenerated annulus and small right paracentral annular rent. There is flattening of the ventral thecal sac and narrowing the ventral CSF space. No significant foraminal stenosis. C7-T1: Mild central disc bulge but no significant spinal or foraminal stenosis. MRI THORACIC SPINE FINDINGS Alignment:  Normal Vertebrae: Normal marrow signal.  No bone lesions or fractures. Cord:  Normal cord signal intensity.  No  cord lesions or syrinx. Paraspinal and other soft tissues: No significant paraspinal findings. Disc levels: No thoracic disc protrusions, spinal or foraminal stenosis. IMPRESSION: 1. Significant multilevel disc protrusions in the cervical spine. Significant canal stenosis and cord impingement most severe at C4-5. Associated abnormal T2 signal intensity in the cord at this level could be edema or myelomalacia. 2. Mild to moderate bilateral foraminal stenosis at C3-4, C4-5 and C5-6. 3. Unremarkable thoracic spine MRI examination. Electronically Signed   By: Rudie Meyer M.D.   On:  03/28/2022 15:38    Scheduled Meds:  folic acid  1 mg Oral Daily   multivitamin with minerals  1 tablet Oral Daily   mupirocin ointment  1 Application Nasal BID   thiamine  100 mg Oral Daily   Or   thiamine  100 mg Intravenous Daily    Continuous Infusions:  lactated ringers 75 mL/hr at 03/29/22 0353     LOS: 1 day     Darlin Drop, MD [facility] Pager [phone]  If 7PM-7AM, please contact night-coverage [email or web address] Password [facility] 03/29/2022, 12:05 PM

## 2022-03-29 NOTE — Consult Note (Signed)
Referring Physician:  No referring provider defined for this encounter.  Primary Physician:  Pcp, No  Chief Complaint:  Decreased hand coordination, balance issues.    History of Present Illness: Lance Green is a 46 y.o. male who presents with the chief complaint of Decreased hand coordination, balance issues.  This has been going on for more than 3 years.  No trauma.  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  Also problems with balance but no recent falls.  No fevers, chills.   The symptoms are causing a significant impact on the patient's life.   Review of Systems:  A 10 point review of systems is negative, except for the pertinent positives and negatives detailed in the HPI.  Past Medical History: Past Medical History:  Diagnosis Date   Alcohol abuse    HTN (hypertension)    Neuropathy    Pancreatitis    Peptic ulcer disease     Past Surgical History: Past Surgical History:  Procedure Laterality Date   APPENDECTOMY     INCISION AND DRAINAGE Left 11/11/2018   Procedure: INCISION AND DRAINAGE;  Surgeon: Donato Heinz, MD;  Location: [facility] ORS;  Service: Orthopedics;  Laterality: Left;   LAPAROSCOPIC APPENDECTOMY N/A 07/06/2018   Procedure: APPENDECTOMY LAPAROSCOPIC;  Surgeon: Carolan Shiver, MD;  Location: [facility] ORS;  Service: General;  Laterality: N/A;    Problem List: Patient Active Problem List   Diagnosis Date Noted   Cervical cord compression with myelopathy (HCC) 03/28/2022   Cavitary pneumonia 02/20/2021   Sepsis (HCC) 02/20/2021   Normocytic anemia 02/20/2021   HTN (hypertension)    Seizure (HCC) 11/21/2019   Alcohol abuse 11/21/2019   Hypomagnesemia 11/21/2019   Abscess of left middle finger 11/10/2018   Acute appendicitis with localized peritonitis 07/06/2018    Allergies: Allergies as of 03/26/2022   (No Known Allergies)    Medications: @ENCMED @  Social History: Social History   Tobacco  Use   Smoking status: Every Day    Packs/day: 0.50    Types: Cigarettes   Smokeless tobacco: Never  Vaping Use   Vaping Use: Never used  Substance Use Topics   Alcohol use: Yes    Comment: daily 40 oz, reports 4 40oz/day   Drug use: No    Family Medical History: Family History  Problem Relation Age of Onset   Cataracts Mother     Physical Examination: @VITALWITHPAIN @  General: Patient is calm, collected, and in no apparent distress.  Psychiatric: Patient is non-anxious.  Head:  Pupils equal, round, and reactive to light.  ENT:  Oral mucosa appears well hydrated.  Neck:   Supple.  Full range of motion.  Respiratory: Patient is breathing without any difficulty.  Extremities: No edema.  Vascular: Palpable pulses.  Skin:   On exposed skin, there are no abnormal skin lesions.  NEUROLOGICAL:  General: In no acute distress.   Awake, alert, oriented to person, place, and time.  Pupils equal round and reactive to light.  Facial tone is symmetric.  Tongue protrusion is midline.  There is no pronator drift.    Strength: Side Biceps Triceps Deltoid Interossei Grip Wrist Ext. Wrist Flex.  R 5 5 5  4+ 4+ 4+ 4+  L 5 5 5  4+ 4+ 4+ 4+   Side Iliopsoas Quads Hamstring PF DF EHL  R 5 5 5 5 5 5   L 5 5 5 5 5 5    Reflexes are 2+  and symmetric at the biceps, triceps, brachioradialis, patella and achilles.   Bilateral upper and lower extremity sensation is intact to light touch and pin prick.  Clonus is not present.  Toes are down-going.  Gait is not tested. Hoffman's is absent.  Imaging: EXAM: MRI CERVICAL AND THORACIC SPINE WITHOUT CONTRAST   TECHNIQUE: Multiplanar and multiecho pulse sequences of the cervical spine, to include the craniocervical junction and cervicothoracic junction, and the thoracic spine, were obtained without intravenous contrast.   COMPARISON:  Neck CT 03/26/2022   FINDINGS: MRI CERVICAL SPINE FINDINGS   Alignment: Normal   Vertebrae: No bone  lesions or fractures.   Cord: Significant cord compression at C4-5 with associated cord edema. No cord lesions.   Posterior Fossa, vertebral arteries, paraspinal tissues: No significant findings.   Disc levels:   C2-3: No significant findings.   C3-4: Moderate-sized broad-based disc protrusion with left paracentral focality. Significant mass effect on the thecal sac with flattening and slight deforming of the anterior cervical cord. Mild foraminal stenosis bilaterally also.   C4-5: Large central and right paracentral disc protrusion with significant mass effect on the cervical spinal cord which is markedly flattened and demonstrates increased T2 signal intensity which could be edema or myelomalacia. Mild foraminal stenosis bilaterally.   C5-6: Moderate-sized central and left paracentral disc protrusion with mass effect on the thecal sac and flattening of the cervical spinal cord. Moderate bilateral foraminal stenosis also.   C6-7: Diffuse bulging degenerated annulus and small right paracentral annular rent. There is flattening of the ventral thecal sac and narrowing the ventral CSF space. No significant foraminal stenosis.   C7-T1: Mild central disc bulge but no significant spinal or foraminal stenosis.   MRI THORACIC SPINE FINDINGS   Alignment:  Normal   Vertebrae: Normal marrow signal.  No bone lesions or fractures.   Cord:  Normal cord signal intensity.  No cord lesions or syrinx.   Paraspinal and other soft tissues: No significant paraspinal findings.   Disc levels:   No thoracic disc protrusions, spinal or foraminal stenosis.   IMPRESSION: 1. Significant multilevel disc protrusions in the cervical spine. Significant canal stenosis and cord impingement most severe at C4-5. Associated abnormal T2 signal intensity in the cord at this level could be edema or myelomalacia. 2. Mild to moderate bilateral foraminal stenosis at C3-4, C4-5 and C5-6. 3. Unremarkable  thoracic spine MRI examination.  I have personally reviewed the images and agree with the above interpretation.  Assessment and Plan: Mr. Riesen is a pleasant 46 y.o. male with signs and symptoms of cervical myelopathy.  I have discussed the condition with the patient, including showing the radiographs and discussing treatment options in layman's terms.  T.  Thus, I have recommended the following:   1)  No acute neurosurgical interventions. 2)  Patient will likely benefit from cervical decompression but at this time, is not a good surgical candidate for several reasons - evidence of rhabdomyolysis and poor social situation given his homelessness.      Will continue to follow.  Thank you for involving me in the care of this patient. I will keep you apprised of the patient's progress.   This note was partially dictated using voice recognition software, so please excuse any errors that were not corrected.   Peterson Ao MD, PhD

## 2022-03-30 LAB — POTASSIUM: Potassium: 3.6 mmol/L (ref 3.5–5.1)

## 2022-03-30 LAB — GLUCOSE, CAPILLARY
Glucose-Capillary: 148 mg/dL — ABNORMAL HIGH (ref 70–99)
Glucose-Capillary: 153 mg/dL — ABNORMAL HIGH (ref 70–99)
Glucose-Capillary: 156 mg/dL — ABNORMAL HIGH (ref 70–99)
Glucose-Capillary: 165 mg/dL — ABNORMAL HIGH (ref 70–99)
Glucose-Capillary: 191 mg/dL — ABNORMAL HIGH (ref 70–99)

## 2022-03-30 LAB — HEMOGLOBIN A1C
Hgb A1c MFr Bld: 5.4 % (ref 4.8–5.6)
Mean Plasma Glucose: 108.28 mg/dL

## 2022-03-30 LAB — CK: Total CK: 189 U/L (ref 49–397)

## 2022-03-30 MED ORDER — LACTATED RINGERS IV SOLN: INTRAVENOUS | Status: DC

## 2022-03-31 LAB — GLUCOSE, CAPILLARY
Glucose-Capillary: 212 mg/dL — ABNORMAL HIGH (ref 70–99)
Glucose-Capillary: 189 mg/dL — ABNORMAL HIGH (ref 70–99)
Glucose-Capillary: 242 mg/dL — ABNORMAL HIGH (ref 70–99)

## 2022-04-01 DIAGNOSIS — M6282 Rhabdomyolysis: Secondary | ICD-10-CM

## 2022-04-01 DIAGNOSIS — E876 Hypokalemia: Secondary | ICD-10-CM

## 2022-04-01 DIAGNOSIS — W19XXXA Unspecified fall, initial encounter: Secondary | ICD-10-CM

## 2022-04-01 LAB — COMPREHENSIVE METABOLIC PANEL
ALT: 31 U/L (ref 0–44)
AST: 30 U/L (ref 15–41)
Albumin: 3.3 g/dL — ABNORMAL LOW (ref 3.5–5.0)
Alkaline Phosphatase: 88 U/L (ref 38–126)
Anion gap: 7 (ref 5–15)
BUN: 8 mg/dL (ref 6–20)
CO2: 24 mmol/L (ref 22–32)
Calcium: 9 mg/dL (ref 8.9–10.3)
Chloride: 108 mmol/L (ref 98–111)
Creatinine, Ser: 0.67 mg/dL (ref 0.61–1.24)
GFR, Estimated: >60 mL/min (ref >60)
Glucose, Bld: 236 mg/dL — ABNORMAL HIGH (ref 70–99)
Potassium: 3.4 mmol/L — ABNORMAL LOW (ref 3.5–5.1)
Sodium: 139 mmol/L (ref 135–145)
Total Bilirubin: 0.3 mg/dL (ref 0.3–1.2)
Total Protein: 6.6 g/dL (ref 6.5–8.1)

## 2022-04-01 LAB — GLUCOSE, CAPILLARY
Glucose-Capillary: 185 mg/dL — ABNORMAL HIGH (ref 70–99)
Glucose-Capillary: 235 mg/dL — ABNORMAL HIGH (ref 70–99)
Glucose-Capillary: 238 mg/dL — ABNORMAL HIGH (ref 70–99)
Glucose-Capillary: 291 mg/dL — ABNORMAL HIGH (ref 70–99)
Glucose-Capillary: 306 mg/dL — ABNORMAL HIGH (ref 70–99)
Glucose-Capillary: 330 mg/dL — ABNORMAL HIGH (ref 70–99)

## 2022-04-01 LAB — MAGNESIUM: Magnesium: 1.4 mg/dL — ABNORMAL LOW (ref 1.7–2.4)

## 2022-04-01 LAB — ABO/RH: ABO/RH(D): B POS

## 2022-04-01 LAB — CBC
HCT: 32.9 % — ABNORMAL LOW (ref 39.0–52.0)
Hemoglobin: 11.3 g/dL — ABNORMAL LOW (ref 13.0–17.0)
MCH: 32.6 pg (ref 26.0–34.0)
MCHC: 34.3 g/dL (ref 30.0–36.0)
MCV: 94.8 fL (ref 80.0–100.0)
Platelets: 196 10*3/uL (ref 150–400)
RBC: 3.47 MIL/uL — ABNORMAL LOW (ref 4.22–5.81)
RDW: 12 % (ref 11.5–15.5)
WBC: 7.7 10*3/uL (ref 4.0–10.5)
nRBC: 0 % (ref 0.0–0.2)

## 2022-04-01 LAB — TYPE AND SCREEN
ABO/RH(D): B POS
Antibody Screen: NEGATIVE

## 2022-04-01 LAB — PROTIME-INR
INR: 1 (ref 0.8–1.2)
Prothrombin Time: 12.8 seconds (ref 11.4–15.2)

## 2022-04-01 MED ORDER — LACTULOSE 10 GM/15ML PO SOLN
20.0000 g | Freq: Once | ORAL | Status: AC
  Administered 2022-04-01: 20 g via ORAL
  Filled 2022-04-01: qty 30

## 2022-04-01 MED ORDER — POTASSIUM CHLORIDE 20 MEQ PO PACK
40.0000 meq | PACK | Freq: Once | ORAL | Status: AC
  Administered 2022-04-01: 40 meq via ORAL
  Filled 2022-04-01: qty 2

## 2022-04-01 MED ORDER — INSULIN ASPART 100 UNIT/ML IJ SOLN
0.0000 [IU] | Freq: Three times a day (TID) | INTRAMUSCULAR | Status: DC
  Administered 2022-04-01: 2 [IU] via SUBCUTANEOUS
  Administered 2022-04-01: 7 [IU] via SUBCUTANEOUS
  Administered 2022-04-01: 3 [IU] via SUBCUTANEOUS
  Administered 2022-04-02: 2 [IU] via SUBCUTANEOUS
  Administered 2022-04-03: 5 [IU] via SUBCUTANEOUS
  Administered 2022-04-03: 2 [IU] via SUBCUTANEOUS
  Administered 2022-04-03 – 2022-04-04 (×2): 3 [IU] via SUBCUTANEOUS
  Administered 2022-04-05: 2 [IU] via SUBCUTANEOUS
  Administered 2022-04-05: 1 [IU] via SUBCUTANEOUS
  Administered 2022-04-06: 3 [IU] via SUBCUTANEOUS
  Administered 2022-04-06 – 2022-04-07 (×3): 1 [IU] via SUBCUTANEOUS
  Administered 2022-04-07: 2 [IU] via SUBCUTANEOUS
  Administered 2022-04-08 (×2): 1 [IU] via SUBCUTANEOUS
  Administered 2022-04-09: 3 [IU] via SUBCUTANEOUS
  Filled 2022-04-01 (×19): qty 1

## 2022-04-01 MED ORDER — INSULIN ASPART 100 UNIT/ML IJ SOLN
0.0000 [IU] | Freq: Every day | INTRAMUSCULAR | Status: DC
  Administered 2022-04-01: 4 [IU] via SUBCUTANEOUS
  Administered 2022-04-03: 3 [IU] via SUBCUTANEOUS
  Administered 2022-04-05: 2 [IU] via SUBCUTANEOUS
  Filled 2022-04-01 (×3): qty 1

## 2022-04-01 MED ORDER — MAGNESIUM SULFATE 4 GM/100ML IV SOLN
4.0000 g | Freq: Once | INTRAVENOUS | Status: AC
  Administered 2022-04-01: 4 g via INTRAVENOUS
  Filled 2022-04-01: qty 100

## 2022-04-01 NOTE — TOC Progression Note (Addendum)
Transition of Care [facility]) - Progression Note    Patient Details  Name: Lance Green MRN: [id number] Date of Birth: 09/21/1976  [facility] CM/SW Contact  Caryn Section, RN Phone Number: 04/01/2022, 10:15 AM  Clinical Narrative:   No bed offers at this time.  Awaiting formal CIR evaluation, however patient does not appear eligible at this time.  TOC will continue to follow.  Addendum 1043am:  RNCM inquired about Medicaid potential status with [facility] financial services, awaiting response.  Expected Discharge Plan: Skilled Nursing Facility Barriers to Discharge: SNF Pending bed offer  Expected Discharge Plan and Services Expected Discharge Plan: Skilled Nursing Facility   Discharge Planning Services: CM Consult Post Acute Care Choice: Skilled Nursing Facility Living arrangements for the past 2 months: Homeless                 DME Arranged: N/A DME Agency: NA       HH Arranged: NA HH Agency: NA         Social Determinants of Health (SDOH) Interventions    Readmission Risk Interventions     No data to display

## 2022-04-01 NOTE — Anesthesia Preprocedure Evaluation (Addendum)
Anesthesia Evaluation  Patient identified by MRN, date of birth, ID band Patient awake    Reviewed: Allergy & Precautions, NPO status , Patient's Chart, lab work & pertinent test results, reviewed documented beta blocker date and time   Airway Mallampati: III  TM Distance: >3 FB     Dental no notable dental hx.    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 Pt with decreased hand coordination, balance issues.  This has been going on for more than 3 years.  Mechanical fall. Traumatic rhabdomyolysis secondary to fall. Condition improved  Hyperglycemia. Hemoglobin A1c 5.4, start sliding scale insulin for now.  Alcohol abuse. No evidence of withdrawal at this time. Last drank around 5 days ago per patient  Reproductive/Obstetrics                           Anesthesia Physical  Anesthesia Plan  ASA: III  Anesthesia Plan: General   Post-op Pain Management: Minimal or no pain anticipated and Ofirmev IV (intra-op)*   Induction: Intravenous  PONV Risk Score and Plan: 1 and Propofol infusion, TIVA, Ondansetron and Dexamethasone  Airway Management Planned: Oral ETT  Additional Equipment:   Intra-op Plan:   Post-operative  Plan: Extubation in OR  Informed Consent: I have reviewed the patients History and Physical, chart, labs and discussed the procedure including the risks, benefits and alternatives for the proposed anesthesia with the patient or authorized representative who has indicated his/her understanding and acceptance.     Dental advisory given  Plan Discussed with: CRNA  Anesthesia Plan Comments:        Anesthesia Quick Evaluation

## 2022-04-01 NOTE — Hospital Course (Addendum)
Lance Green is a 46 y.o. male who presents with chief complaint of decreased hand coordination, balance issues.  This has been going on for more than 3 years.  Cervical MRI showed cervical spine stenosis, most severe in C4-C5.  Patient had anterior cervical discectomy and fusion on C4-5, C5-6, and C3-4 on 6/28. 6/30 ED evaluated by PT/OT, recommended nursing home placement.  Patient also developed dysphagia, appears to be associated with cervical surgery, he is on dysphagia 2 diet, anticipating recovery spontaneously.

## 2022-04-02 ENCOUNTER — Encounter
Admission: EM | Disposition: A | Discharge status: Home Health | Payer: Self-pay | Source: Home / Self Care | Attending: Internal Medicine

## 2022-04-02 ENCOUNTER — Inpatient Hospital Stay: Payer: Self-pay | Admitting: Anesthesiology

## 2022-04-02 ENCOUNTER — Other Ambulatory Visit: Payer: Self-pay

## 2022-04-02 ENCOUNTER — Inpatient Hospital Stay: Payer: Medicaid Other

## 2022-04-02 ENCOUNTER — Encounter: Payer: Self-pay | Admitting: Internal Medicine

## 2022-04-02 HISTORY — PX: ANTERIOR CERVICAL DECOMP/DISCECTOMY FUSION: SHX1161

## 2022-04-02 LAB — BASIC METABOLIC PANEL
Anion gap: 9 (ref 5–15)
BUN: 6 mg/dL (ref 6–20)
CO2: 19 mmol/L — ABNORMAL LOW (ref 22–32)
Calcium: 9 mg/dL (ref 8.9–10.3)
Chloride: 109 mmol/L (ref 98–111)
Creatinine, Ser: 0.78 mg/dL (ref 0.61–1.24)
GFR, Estimated: >60 mL/min (ref >60)
Glucose, Bld: 198 mg/dL — ABNORMAL HIGH (ref 70–99)
Potassium: 4.2 mmol/L (ref 3.5–5.1)
Sodium: 137 mmol/L (ref 135–145)

## 2022-04-02 LAB — GLUCOSE, CAPILLARY
Glucose-Capillary: 175 mg/dL — ABNORMAL HIGH (ref 70–99)
Glucose-Capillary: 88 mg/dL (ref 70–99)
Glucose-Capillary: 132 mg/dL — ABNORMAL HIGH (ref 70–99)

## 2022-04-02 LAB — MAGNESIUM: Magnesium: 1.5 mg/dL — ABNORMAL LOW (ref 1.7–2.4)

## 2022-04-02 SURGERY — ANTERIOR CERVICAL DECOMPRESSION/DISCECTOMY FUSION 3 LEVELS
Anesthesia: General | Site: Spine Cervical

## 2022-04-02 MED ORDER — FENTANYL CITRATE (PF) 100 MCG/2ML IJ SOLN
INTRAMUSCULAR | Status: AC
  Filled 2022-04-02: qty 2

## 2022-04-02 MED ORDER — FENTANYL CITRATE (PF) 100 MCG/2ML IJ SOLN
25.0000 ug | INTRAMUSCULAR | Status: DC | PRN
  Administered 2022-04-02: 50 ug via INTRAVENOUS
  Administered 2022-04-02: 25 ug via INTRAVENOUS

## 2022-04-02 MED ORDER — LABETALOL HCL 5 MG/ML IV SOLN
INTRAVENOUS | Status: DC | PRN
  Administered 2022-04-02: 10 mg via INTRAVENOUS
  Administered 2022-04-02 (×2): 5 mg via INTRAVENOUS

## 2022-04-02 MED ORDER — CEFAZOLIN SODIUM-DEXTROSE 2-4 GM/100ML-% IV SOLN
2.0000 g | Freq: Once | INTRAVENOUS | Status: AC
  Administered 2022-04-02: 2 g via INTRAVENOUS

## 2022-04-02 MED ORDER — MIDAZOLAM HCL 2 MG/2ML IJ SOLN
INTRAMUSCULAR | Status: AC
  Filled 2022-04-02: qty 2

## 2022-04-02 MED ORDER — PROPOFOL 10 MG/ML IV BOLUS
INTRAVENOUS | Status: DC | PRN
  Administered 2022-04-02: 50 mg via INTRAVENOUS
  Administered 2022-04-02: 150 mg via INTRAVENOUS

## 2022-04-02 MED ORDER — ACETAMINOPHEN 10 MG/ML IV SOLN
INTRAVENOUS | Status: AC
  Filled 2022-04-02: qty 100

## 2022-04-02 MED ORDER — SUCCINYLCHOLINE CHLORIDE 200 MG/10ML IV SOSY
PREFILLED_SYRINGE | INTRAVENOUS | Status: DC | PRN
  Administered 2022-04-02: 80 mg via INTRAVENOUS

## 2022-04-02 MED ORDER — 0.9 % SODIUM CHLORIDE (POUR BTL) OPTIME
TOPICAL | Status: DC | PRN
  Administered 2022-04-02: 1000 mL

## 2022-04-02 MED ORDER — DEXAMETHASONE SODIUM PHOSPHATE 10 MG/ML IJ SOLN
INTRAMUSCULAR | Status: DC | PRN
  Administered 2022-04-02: 10 mg via INTRAVENOUS

## 2022-04-02 MED ORDER — HYDROMORPHONE HCL 1 MG/ML IJ SOLN
1.0000 mg | Freq: Once | INTRAMUSCULAR | Status: AC
  Administered 2022-04-02: 1 mg via INTRAVENOUS
  Filled 2022-04-02: qty 1

## 2022-04-02 MED ORDER — FENTANYL CITRATE (PF) 100 MCG/2ML IJ SOLN
INTRAMUSCULAR | Status: DC | PRN
  Administered 2022-04-02 (×2): 50 ug via INTRAVENOUS

## 2022-04-02 MED ORDER — OXYCODONE HCL 5 MG PO TABS: 5.0000 mg | ORAL_TABLET | Freq: Once | ORAL | Status: DC | PRN

## 2022-04-02 MED ORDER — LABETALOL HCL 5 MG/ML IV SOLN
INTRAVENOUS | Status: AC
  Administered 2022-04-02: 10 mg via INTRAVENOUS
  Filled 2022-04-02: qty 4

## 2022-04-02 MED ORDER — REMIFENTANIL HCL 1 MG IV SOLR
INTRAVENOUS | Status: AC
  Filled 2022-04-02: qty 1000

## 2022-04-02 MED ORDER — ONDANSETRON HCL 4 MG/2ML IJ SOLN
INTRAMUSCULAR | Status: DC | PRN
  Administered 2022-04-02: 4 mg via INTRAVENOUS

## 2022-04-02 MED ORDER — METHOCARBAMOL 500 MG PO TABS
500.0000 mg | ORAL_TABLET | Freq: Four times a day (QID) | ORAL | Status: DC | PRN
  Administered 2022-04-02 – 2022-04-07 (×4): 500 mg via ORAL
  Filled 2022-04-02 (×4): qty 1

## 2022-04-02 MED ORDER — CEFAZOLIN SODIUM-DEXTROSE 2-4 GM/100ML-% IV SOLN
INTRAVENOUS | Status: AC
  Filled 2022-04-02: qty 100

## 2022-04-02 MED ORDER — LABETALOL HCL 5 MG/ML IV SOLN
10.0000 mg | INTRAVENOUS | Status: AC | PRN
  Administered 2022-04-02: 10 mg via INTRAVENOUS

## 2022-04-02 MED ORDER — KETAMINE HCL 50 MG/5ML IJ SOSY
PREFILLED_SYRINGE | INTRAMUSCULAR | Status: AC
Start: 2022-04-02 — End: ?
  Filled 2022-04-02: qty 5

## 2022-04-02 MED ORDER — LIDOCAINE HCL (CARDIAC) PF 100 MG/5ML IV SOSY
PREFILLED_SYRINGE | INTRAVENOUS | Status: DC | PRN
  Administered 2022-04-02: 100 mg via INTRAVENOUS

## 2022-04-02 MED ORDER — PROPOFOL 10 MG/ML IV BOLUS
INTRAVENOUS | Status: AC
  Filled 2022-04-02: qty 20

## 2022-04-02 MED ORDER — BUPIVACAINE-EPINEPHRINE (PF) 0.5% -1:200000 IJ SOLN
INTRAMUSCULAR | Status: AC
Start: 2022-04-02 — End: ?
  Filled 2022-04-02: qty 30

## 2022-04-02 MED ORDER — LABETALOL HCL 5 MG/ML IV SOLN
INTRAVENOUS | Status: AC
Start: 2022-04-02 — End: ?
  Filled 2022-04-02: qty 4

## 2022-04-02 MED ORDER — PROPOFOL 1000 MG/100ML IV EMUL
INTRAVENOUS | Status: AC
  Filled 2022-04-02: qty 100

## 2022-04-02 MED ORDER — PROMETHAZINE HCL 25 MG/ML IJ SOLN: 6.2500 mg | INTRAMUSCULAR | Status: DC | PRN

## 2022-04-02 MED ORDER — SODIUM CHLORIDE 0.9 % IV SOLN
INTRAVENOUS | Status: DC | PRN
  Administered 2022-04-02: .1 ug/kg/min via INTRAVENOUS

## 2022-04-02 MED ORDER — OXYCODONE HCL 5 MG PO TABS
5.0000 mg | ORAL_TABLET | ORAL | Status: DC | PRN
  Administered 2022-04-02 – 2022-04-08 (×3): 5 mg via ORAL
  Filled 2022-04-02 (×2): qty 1

## 2022-04-02 MED ORDER — DROPERIDOL 2.5 MG/ML IJ SOLN: 0.6250 mg | Freq: Once | INTRAMUSCULAR | Status: DC | PRN

## 2022-04-02 MED ORDER — ACETAMINOPHEN 10 MG/ML IV SOLN
INTRAVENOUS | Status: DC | PRN
  Administered 2022-04-02: 1000 mg via INTRAVENOUS

## 2022-04-02 MED ORDER — MAGNESIUM SULFATE 4 GM/100ML IV SOLN
4.0000 g | Freq: Once | INTRAVENOUS | Status: AC
  Administered 2022-04-02: 4 g via INTRAVENOUS
  Filled 2022-04-02: qty 100

## 2022-04-02 MED ORDER — ACETAMINOPHEN 10 MG/ML IV SOLN: 1000.0000 mg | Freq: Once | INTRAVENOUS | Status: DC | PRN

## 2022-04-02 MED ORDER — SODIUM CHLORIDE 0.9 % IV SOLN: INTRAVENOUS | Status: DC | PRN

## 2022-04-02 MED ORDER — OXYCODONE HCL 5 MG/5ML PO SOLN: 5.0000 mg | Freq: Once | ORAL | Status: DC | PRN

## 2022-04-02 MED ORDER — PROPOFOL 500 MG/50ML IV EMUL
INTRAVENOUS | Status: DC | PRN
  Administered 2022-04-02: 120 ug/kg/min via INTRAVENOUS

## 2022-04-02 MED ORDER — METHOCARBAMOL 500 MG PO TABS
ORAL_TABLET | ORAL | Status: AC
  Filled 2022-04-02: qty 1

## 2022-04-02 MED ORDER — SURGIFLO WITH THROMBIN (HEMOSTATIC MATRIX KIT) OPTIME
TOPICAL | Status: DC | PRN
  Administered 2022-04-02: 1 via TOPICAL

## 2022-04-02 MED ORDER — FENTANYL CITRATE (PF) 100 MCG/2ML IJ SOLN
INTRAMUSCULAR | Status: AC
  Administered 2022-04-02: 25 ug via INTRAVENOUS
  Filled 2022-04-02: qty 2

## 2022-04-02 MED ORDER — LACTATED RINGERS IV SOLN: INTRAVENOUS | Status: DC | PRN

## 2022-04-02 MED ORDER — KETAMINE HCL 10 MG/ML IJ SOLN
INTRAMUSCULAR | Status: DC | PRN
  Administered 2022-04-02: 50 mg via INTRAVENOUS

## 2022-04-02 MED ORDER — MIDAZOLAM HCL 2 MG/2ML IJ SOLN
INTRAMUSCULAR | Status: DC | PRN
  Administered 2022-04-02: 2 mg via INTRAVENOUS

## 2022-04-02 MED ORDER — BUPIVACAINE-EPINEPHRINE 0.5% -1:200000 IJ SOLN
INTRAMUSCULAR | Status: DC | PRN
  Administered 2022-04-02: 5 mL

## 2022-04-02 MED ORDER — MORPHINE SULFATE (PF) 2 MG/ML IV SOLN
2.0000 mg | INTRAVENOUS | Status: DC | PRN
  Administered 2022-04-03 (×2): 2 mg via INTRAVENOUS
  Filled 2022-04-02 (×2): qty 1

## 2022-04-02 MED ORDER — PHENYLEPHRINE HCL-NACL 20-0.9 MG/250ML-% IV SOLN
INTRAVENOUS | Status: DC | PRN
  Administered 2022-04-02: 15 ug/min via INTRAVENOUS

## 2022-04-02 MED ORDER — ACETAMINOPHEN 500 MG PO TABS: 1000.0000 mg | ORAL_TABLET | Freq: Once | ORAL | Status: DC

## 2022-04-02 MED ORDER — OXYCODONE HCL 5 MG PO TABS
ORAL_TABLET | ORAL | Status: AC
  Filled 2022-04-02: qty 1

## 2022-04-02 SURGICAL SUPPLY — 62 items
"PENCIL ELECTRO HAND CTR " (MISCELLANEOUS) ×1 IMPLANT
ALLOGRAFT BONE FIBER KORE 5 (Bone Implant) ×1 IMPLANT
BASIN KIT SINGLE STR (MISCELLANEOUS) ×2 IMPLANT
BULB RESERV EVAC DRAIN JP 100C (MISCELLANEOUS) IMPLANT
BUR NEURO DRILL SOFT 3.0X3.8M (BURR) ×2 IMPLANT
CHLORAPREP W/TINT 26 (MISCELLANEOUS) ×2 IMPLANT
COUNTER NEEDLE 20/40 LG (NEEDLE) ×2 IMPLANT
DERMABOND ADVANCED (GAUZE/BANDAGES/DRESSINGS) ×1
DERMABOND ADVANCED .7 DNX12 (GAUZE/BANDAGES/DRESSINGS) ×1 IMPLANT
DRAIN CHANNEL JP 10F RND 20C F (MISCELLANEOUS) IMPLANT
DRAPE C ARM PK CFD 31 SPINE (DRAPES) ×2 IMPLANT
DRAPE LAPAROTOMY 77X122 PED (DRAPES) ×2 IMPLANT
DRAPE MICROSCOPE SPINE 48X150 (DRAPES) ×2 IMPLANT
DRAPE SURG 17X11 SM STRL (DRAPES) ×2 IMPLANT
ELECT CAUTERY BLADE TIP 2.5 (TIP) ×2
ELECTRODE CAUTERY BLDE TIP 2.5 (TIP) ×1 IMPLANT
FEE INTRAOP CADWELL SUPPLY NCS (MISCELLANEOUS) IMPLANT
FEE INTRAOP MONITOR IMPULS NCS (MISCELLANEOUS) IMPLANT
GAUZE SPONGE 4X4 12PLY STRL (GAUZE/BANDAGES/DRESSINGS) ×2 IMPLANT
GLOVE BIOGEL PI IND STRL 6.5 (GLOVE) ×1 IMPLANT
GLOVE BIOGEL PI IND STRL 8.5 (GLOVE) ×1 IMPLANT
GLOVE BIOGEL PI INDICATOR 6.5 (GLOVE) ×1
GLOVE BIOGEL PI INDICATOR 8.5 (GLOVE) ×1
GLOVE SURG SYN 6.5 ES PF (GLOVE) ×4 IMPLANT
GLOVE SURG SYN 6.5 PF PI (GLOVE) ×2 IMPLANT
GLOVE SURG SYN 8.5  E (GLOVE) ×6
GLOVE SURG SYN 8.5 E (GLOVE) ×3 IMPLANT
GLOVE SURG SYN 8.5 PF PI (GLOVE) ×3 IMPLANT
GOWN SRG LRG LVL 4 IMPRV REINF (GOWNS) ×2 IMPLANT
GOWN SRG XL LVL 3 NONREINFORCE (GOWNS) ×1 IMPLANT
GOWN STRL NON-REIN TWL XL LVL3 (GOWNS) ×2
GOWN STRL REIN LRG LVL4 (GOWNS) ×4
GRADUATE 1200CC STRL 31836 (MISCELLANEOUS) ×2 IMPLANT
INTRAOP CADWELL SUPPLY FEE NCS (MISCELLANEOUS) ×1
INTRAOP DISP SUPPLY FEE NCS (MISCELLANEOUS) ×2
INTRAOP MONITOR FEE IMPULS NCS (MISCELLANEOUS) ×1
INTRAOP MONITOR FEE IMPULSE (MISCELLANEOUS) ×2
KIT TURNOVER KIT A (KITS) ×2 IMPLANT
MANIFOLD NEPTUNE II (INSTRUMENTS) ×2 IMPLANT
MARKER SKIN DUAL TIP RULER LAB (MISCELLANEOUS) ×3 IMPLANT
NEEDLE HYPO 22GX1.5 SAFETY (NEEDLE) ×2 IMPLANT
NS IRRIG 1000ML POUR BTL (IV SOLUTION) ×2 IMPLANT
PACK LAMINECTOMY NEURO (CUSTOM PROCEDURE TRAY) ×2 IMPLANT
PAD ARMBOARD 7.5X6 YLW CONV (MISCELLANEOUS) ×4 IMPLANT
PENCIL ELECTRO HAND CTR (MISCELLANEOUS) ×2 IMPLANT
PIN CASPAR 14 (PIN) ×1 IMPLANT
PIN CASPAR 14MM (PIN) ×2 IMPLANT
PLATE ANT CERV XTEND 3 LV 48 (Plate) ×1 IMPLANT
SCREW VAR 4.2 XD SELF DRILL 16 (Screw) ×8 IMPLANT
SOLUTION IRRIG SURGIPHOR (IV SOLUTION) ×1 IMPLANT
SPACER C HEDRON 12X14 7M 7D (Spacer) ×3 IMPLANT
SPONGE KITTNER 5P (MISCELLANEOUS) ×4 IMPLANT
STAPLER SKIN PROX 35W (STAPLE) ×1 IMPLANT
STRIP CLOSURE SKIN 1/2X4 (GAUZE/BANDAGES/DRESSINGS) IMPLANT
SURGIFLO W/THROMBIN 8M KIT (HEMOSTASIS) ×2 IMPLANT
SUT V-LOC 90 ABS DVC 3-0 CL (SUTURE) ×2 IMPLANT
SUT VIC AB 3-0 SH 8-18 (SUTURE) ×2 IMPLANT
SYR 30ML LL (SYRINGE) ×2 IMPLANT
TAPE CLOTH 3X10 WHT NS LF (GAUZE/BANDAGES/DRESSINGS) ×5 IMPLANT
TOWEL OR 17X26 4PK STRL BLUE (TOWEL DISPOSABLE) ×8 IMPLANT
TRAY FOLEY MTR SLVR 16FR STAT (SET/KITS/TRAYS/PACK) ×1 IMPLANT
TUBING CONNECTING 10 (TUBING) ×2 IMPLANT

## 2022-04-02 NOTE — Progress Notes (Signed)
Patient awake/alert x4  Moving x4 ext.  Upon arrival to pacu patient stated he was unable to move "any" of his ext. With time, able to wiggle toes, bend bil knee's sit up in bed and hand grasps bil present. Dr. Myer Haff at bedside x2. Patient awake and verbalizes understanding of procedure and that it will take time to fully recover.

## 2022-04-02 NOTE — Plan of Care (Signed)

## 2022-04-02 NOTE — Transfer of Care (Signed)
Immediate Anesthesia Transfer of Care Note  Patient: Lance Green  Procedure(s) Performed: C3-6 ANTERIOR CERVICAL DECOMPRESSION/DISCECTOMY FUSION 3 LEVELS (Spine Cervical)  Patient Location: PACU  Anesthesia Type:General  Level of Consciousness: awake, alert  and oriented  Airway & Oxygen Therapy: Patient Spontanous Breathing and Patient connected to face mask oxygen  Post-op Assessment: Report given to RN, Post -op Vital signs reviewed and stable and Patient moving all extremities  Post vital signs: Reviewed and stable  Last Vitals:  Vitals Value Taken Time  BP 161/93 04/02/22 1817  Temp    Pulse 86 04/02/22 1824  Resp 19 04/02/22 1824  SpO2 100 % 04/02/22 1824  Vitals shown include unvalidated device data.  Last Pain:  Vitals:   04/02/22 1347  TempSrc: Temporal  PainSc: 0-No pain      Patients Stated Pain Goal: 1 (03/28/22 2355)  Complications: No notable events documented.

## 2022-04-02 NOTE — Progress Notes (Signed)
  Progress Note   Patient: Lance Green UTM:[id number] DOB: [birth year] DOA: 03/26/2022     5 DOS: the patient was seen and examined on 04/02/2022   Brief hospital course: Lance Green is a 46 y.o. male who presents with chief complaint of decreased hand coordination, balance issues.  This has been going on for more than 3 years.  Cervical MRI showed cervical spine stenosis, most severe in C4-C5.  Surgery scheduled on 6/28 by neurosurgery  Assessment and Plan: Severe cervical spine stenosis. Patient going to surgery today.   Mechanical fall. Traumatic rhabdomyolysis secondary to fall. Condition improved.  Hypokalemia Hypomagnesemia. Magnesium still 1.5, give another 4 g of magnesium sulfate.   Hyperglycemia.  Uncontrolled type 2 diabetes. Continue sliding scale insulin.  Patient is going to surgery today, hold off additional adjustment   Alcohol abuse. No evidence of withdrawal at this time       Subjective:  Patient doing well, no complaints.  Physical Exam: Vitals:   04/01/22 1619 04/01/22 2047 04/02/22 0548 04/02/22 1347  BP: (!) 159/92 (!) 164/81 124/77 127/78  Pulse: 81 89 82 75  Resp:  18 16 17   Temp: 98.1 F (36.7 C) 98.8 F (37.1 C) 99 F (37.2 C) 97.9 F (36.6 C)  TempSrc:    Temporal  SpO2: 100% 100% 100% 98%  Weight:      Height:       General exam: Appears calm and comfortable  Respiratory system: Clear to auscultation. Respiratory effort normal. Cardiovascular system: S1 & S2 heard, RRR. No JVD, murmurs, rubs, gallops or clicks. No pedal edema. Gastrointestinal system: Abdomen is nondistended, soft and nontender. No organomegaly or masses felt. Normal bowel sounds heard. Central nervous system: Alert and oriented. No focal neurological deficits. Extremities: Symmetric 5 x 5 power. Skin: No rashes, lesions or ulcers Psychiatry: Judgement and insight appear normal. Mood & affect appropriate.   Data Reviewed:  Lab results reviewed  Family  Communication:   Disposition: Status is: Inpatient Remains inpatient appropriate because: Severity of disease, pending surgery  Planned Discharge Destination:  To be determined.    Time spent: 30 minutes  Author: , MD 04/02/2022 1:54 PM  For on call review [email or web address].

## 2022-04-02 NOTE — Inpatient Diabetes Management (Signed)
Inpatient Diabetes Program Recommendations  AACE/ADA: New Consensus Statement on Inpatient Glycemic Control   Target Ranges:  Prepandial:   less than 140 mg/dL      Peak postprandial:   less than 180 mg/dL (1-2 hours)      Critically ill patients:  140 - 180 mg/dL    Latest Reference Range & Units 04/01/22 05:02 04/01/22 09:03 04/01/22 12:10 04/01/22 16:21 04/01/22 20:49 04/02/22 08:37  Glucose-Capillary 70 - 99 mg/dL 211 (H) 173 (H) 567 (H) 306 (H) 330 (H) 175 (H)    Latest Reference Range & Units 03/29/22 04:05  Hemoglobin A1C 4.8 - 5.6 % 5.4   Review of Glycemic Control  Diabetes history: NO Outpatient Diabetes medications: NA Current orders for Inpatient glycemic control: Novolog 0-9 units TID with meals, Novolog 0-5 units QHS  Inpatient Diabetes Program Recommendations:    Insulin: Please consider ordering Novolog 4 units TID with meals for meal coverage if patient eats at least 50% of meals.  HbgA1C: A1C 5.4% on 03/29/22 indicating an average glucose of 108 mg/dl. However, glucose has been consistently elevated and glucose trends much higher than A1C would indicate. May want to consider reordering A1C.  Thanks, Orlando Penner, RN, MSN, CDCES Diabetes Coordinator Inpatient Diabetes Program [phone] (Team Pager from 8am to 5pm)

## 2022-04-02 NOTE — Op Note (Signed)
Indications: Mr. Crisco is a 46 yo male who presented  with worsening weakness and cervical myelopathy due to cervical stenosis.  He had difficulty walking due to imbalance.  Findings: severe stenosis  Preoperative Diagnosis: Cervical myelopathy, weakness, cervical stenosis Postoperative Diagnosis: same   EBL: 50 ml IVF: see AR ml Drains: none Disposition: Extubated and Stable to PACU Complications: none  A foley catheter was placed.   Preoperative Note:   Risks of surgery discussed include: infection, bleeding, stroke, coma, death, paralysis, CSF leak, nerve/spinal cord injury, numbness, tingling, weakness, complex regional pain syndrome, recurrent stenosis and/or disc herniation, vascular injury, development of instability, neck/back pain, need for further surgery, persistent symptoms, development of deformity, and the risks of anesthesia. The patient understood these risks and agreed to proceed.  Operative Note:  Operative Procedure: 1. Anterior Cervical Discectomy and Fusion C4-5 including bilateral foraminotomies and end plate preparation  2. Anterior Cervical Discectomy and Fusion C5-6 including bilateral foraminotomies and end plate preparation  3. Anterior Cervical Discectomy and Fusion C3-4 including bilateral foraminotomies and end plate preparation 4. Anterior Spinal Instrumentation C3 to 6 using Globus Xtend 5. Anterior arthrodesis from C3 to C6 with placement of biomechanical devices at C3-4, C4-5, and C5-6 6. Use of the operative microscope 7. Use of intraoperative flouroscopy  PROCEDURE IN DETAIL: After obtaining informed consent, the patient taken to the operating room, placed in supine position, general anesthesia induced.  The patient had a small shoulder roll placed behind their shoulders.  The patient received preop antibiotics and IV Decadron.  The patient had a neck incision outlined, was prepped and draped in usual sterile fashion. The incision was injected with  local anesthetic.   An incision was opened, dissection taken down medial to the carotid artery and jugular vein, lateral to the trachea and esophagus.  The prevertebral fascia identified and a localizing x-ray demonstrated the correct level.  The longus colli were dissected laterally, and self-retaining retractors placed to open the operative field. The microscope was then brought into the field.  With this complete, distractor pins were placed in the vertebral bodies of C3 and C4. The distractor was placed from C3-4, and the annulus at C3-4 was opened using a bovie.  Curettes and pituitary rongeurs used to remove the majority of disk, then the drill was used to remove the posterior osteophyte and begin the foraminotomies. The nerve hook was used to elevate the posterior longitudinal ligament, which was then removed with Kerrison rongeurs. The microblunt nerve hook could be passed out the foramen bilaterally.   Meticulous hemostasis was obtained.    A trial was used to size the disc space. A biomechanical device (Globus Hedron C 7 mm height x 14 mm width x 12 mm depth) was filled with bone-promoting allograft and tapped behind the anterior lip of the vertebral body at C3/4.    The distractor was removed, and the C3 distractor pin removed. Bone wax was used for hemostasis.  A caspar pin was placed at C6, then the distractor placed at C4-6. The annulus C4-5 was opened using a bovie.  Curettes and pituitary rongeurs used to remove the majority of disk, then the drill was used to remove the posterior osteophyte and begin the foraminotomies. The nerve hook was used to elevate the posterior longitudinal ligament, which was then removed with Kerrison rongeurs. The microblunt nerve hook could be passed out the foramen bilaterally.   Meticulous hemostasis was obtained.    A trial was used to size the disc  space. A biomechanical device (Globus Hedron C 7 mm height x 14 mm width x 12 mm depth) was filled with  bone-promoting allograft and tapped behind the anterior lip of the vertebral body at C4/5.    The annulus C5-6 was opened using a bovie.  Curettes and pituitary rongeurs used to remove the majority of disk, then the drill was used to remove the posterior osteophyte and begin the foraminotomies. The nerve hook was used to elevate the posterior longitudinal ligament, which was then removed with Kerrison rongeurs. The microblunt nerve hook could be passed out the foramen bilaterally.   Meticulous hemostasis was obtained.    A trial was used to size the disc space. A biomechanical device (Globus Hedron C 7 mm height x 14 mm width x 12 mm depth) was filled with bone-promoting allograft and tapped behind the anterior lip of the vertebral body at C5/6.   The caspar distractor was removed, and bone wax used for hemostasis at each level. The anterior osteophytes were removed.   A separate, four segment, three level plate (51 mm Globus Xtend) was chosen.  Two screws placed in the vertebral bodies of all four segments, respectively making sure the screws were behind the locking mechanism.  Final AP and lateral radiographs were taken.   With everything in good position, the wound was irrigated copiously with bacitracin-containing solution and meticulous hemostasis obtained.  Wound was closed in 2 layers using interrupted inverted 3-0 Vicryl sutures in the platysma and 3-0 monocryl in the dermis.  The wound was dressed with dermabond, the head of bed at 30 degrees, taken to recovery room in stable condition.  No new postop neurological deficits were identified.  All counts were correct at the end of the case.   Monitoring was used throughout without any changes.   Venetia Night MD

## 2022-04-02 NOTE — Anesthesia Postprocedure Evaluation (Signed)
Anesthesia Post Note  Patient: Lance Green  Procedure(s) Performed: C3-6 ANTERIOR CERVICAL DECOMPRESSION/DISCECTOMY FUSION 3 LEVELS (Spine Cervical)  Patient location during evaluation: PACU Anesthesia Type: General Level of consciousness: awake and alert Pain management: pain level controlled Vital Signs Assessment: post-procedure vital signs reviewed and stable Respiratory status: spontaneous breathing, nonlabored ventilation, respiratory function stable and patient connected to nasal cannula oxygen Cardiovascular status: blood pressure returned to baseline and stable Postop Assessment: no apparent nausea or vomiting Anesthetic complications: no   No notable events documented.   Last Vitals:  Vitals:   04/02/22 1845 04/02/22 1915  BP: (!) 171/103   Pulse:  72  Resp:  18  Temp: (!) 36.3 C   SpO2:  99%    Last Pain:  Vitals:   04/02/22 1915  TempSrc:   PainSc: 6                  Yevette Edwards

## 2022-04-02 NOTE — H&P (Signed)
Referring Physician:  No referring provider defined for this encounter.  Primary Physician:  Pcp, No  Chief Complaint:  weakness  History of Present Illness: 04/02/2022 Lance Green continues to have weakness in his arms.  03/29/22 HPI: Lance Green is a 46 y.o. male who presents with the chief complaint of Decreased hand coordination, balance issues.  This has been going on for more than 3 years.  No trauma.  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  Also problems with balance but no recent falls.  No fevers, chills.  Review of Systems:  A 10 point review of systems is negative, except for the pertinent positives and negatives detailed in the HPI.  Past Medical History: Past Medical History:  Diagnosis Date   Alcohol abuse    HTN (hypertension)    Neuropathy    Pancreatitis    Peptic ulcer disease     Past Surgical History: Past Surgical History:  Procedure Laterality Date   APPENDECTOMY     INCISION AND DRAINAGE Left 11/11/2018   Procedure: INCISION AND DRAINAGE;  Surgeon: Donato Heinz, MD;  Location: [facility] ORS;  Service: Orthopedics;  Laterality: Left;   LAPAROSCOPIC APPENDECTOMY N/A 07/06/2018   Procedure: APPENDECTOMY LAPAROSCOPIC;  Surgeon: Carolan Shiver, MD;  Location: [facility] ORS;  Service: General;  Laterality: N/A;    Allergies: Allergies as of 03/26/2022   (No Known Allergies)    Medications:  Current Facility-Administered Medications:    [MAR Hold] acetaminophen (TYLENOL) tablet 650 mg, 650 mg, Oral, Q6H PRN **OR** [MAR Hold] acetaminophen (TYLENOL) suppository 650 mg, 650 mg, Rectal, Q6H PRN, Eduard Clos, MD   ceFAZolin (ANCEF) 2-4 GM/100ML-% IVPB, , , ,    [MAR Hold] folic acid (FOLVITE) tablet 1 mg, 1 mg, Oral, Daily, Midge Minium N, MD, 1 mg at 04/01/22 0941   [MAR Hold] hydrALAZINE (APRESOLINE) injection 5 mg, 5 mg, Intravenous, Q4H PRN, Eduard Clos, MD   [facility] Hold] insulin  aspart (novoLOG) injection 0-5 Units, 0-5 Units, Subcutaneous, QHS, Marrion Coy, MD, 4 Units at 04/01/22 2148   [facility] Hold] insulin aspart (novoLOG) injection 0-9 Units, 0-9 Units, Subcutaneous, TID WC, Marrion Coy, MD, 2 Units at 04/02/22 0844   [MAR Hold] multivitamin with minerals tablet 1 tablet, 1 tablet, Oral, Daily, Eduard Clos, MD, 1 tablet at 04/01/22 0941   [MAR Hold] mupirocin ointment (BACTROBAN) 2 % 1 Application, 1 Application, Nasal, BID, Manuela Schwartz, NP, 1 Application at 04/02/22 0845   [MAR Hold] thiamine tablet 100 mg, 100 mg, Oral, Daily, 100 mg at 04/01/22 0941 **OR** [MAR Hold] thiamine (B-1) injection 100 mg, 100 mg, Intravenous, Daily, Eduard Clos, MD, 100 mg at 04/02/22 6237   Social History: Social History   Tobacco Use   Smoking status: Every Day    Packs/day: 0.50    Types: Cigarettes   Smokeless tobacco: Never  Vaping Use   Vaping Use: Never used  Substance Use Topics   Alcohol use: Yes    Comment: daily 40 oz, reports 4 40oz/day   Drug use: No    Family Medical History: Family History  Problem Relation Age of Onset   Cataracts Mother     Physical Examination: Vitals:   04/02/22 0548 04/02/22 1347  BP: 124/77 127/78  Pulse: 82 75  Resp: 16 17  Temp: 99 F (37.2 C) 97.9 F (36.6 C)  SpO2: 100% 98%   Heart sounds normal no MRG. Chest  Clear to Auscultation Bilaterally.    General: Patient is well developed, well nourished, calm, collected, and in no apparent distress.  Psychiatric: Patient is non-anxious.  Head:  Pupils equal, round, and reactive to light.  ENT:  Oral mucosa appears well hydrated.  Neck:   Supple.  Full range of motion.  Respiratory: Patient is breathing without any difficulty.  Extremities: No edema.  Vascular: Palpable pulses in dorsal pedal vessels.  Skin:   On exposed skin, there are no abnormal skin lesions.  NEUROLOGICAL:  AA Ox3 CNI   Strength:4/5 BUE deltoids, interossei, grip,  wrist extension. 5/5 throughout BLE Sensation diminished in hands and arms Cannot fully open hands   Imaging: MRI C spine 03/28/22 IMPRESSION: 1. Significant multilevel disc protrusions in the cervical spine. Significant canal stenosis and cord impingement most severe at C4-5. Associated abnormal T2 signal intensity in the cord at this level could be edema or myelomalacia. 2. Mild to moderate bilateral foraminal stenosis at C3-4, C4-5 and C5-6. 3. Unremarkable thoracic spine MRI examination.     Electronically Signed   By: Rudie Meyer M.D.   On: 03/28/2022 15:38  I have personally reviewed the images and agree with the above interpretation.  Labs:    Latest Ref Rng & Units 04/01/2022    4:41 AM 03/29/2022    4:05 AM 03/28/2022    8:37 PM  CBC  WBC 4.0 - 10.5 K/uL 7.7  7.4  7.7   Hemoglobin 13.0 - 17.0 g/dL 97.3  53.2  99.2   Hematocrit 39.0 - 52.0 % 32.9  33.2  35.1   Platelets 150 - 400 K/uL 196  148  150        Assessment and Plan: Lance Green is here for progressive cervical myelopathy. He was dehydrated and suffering from rhabdomyolysis on admission. His medical condition is now stabilized.   We will proceed with C3-6 ACDF today.  I previously discussed the risks.     [name] K. Myer Haff MD, MPHS Dept. of Neurosurgery

## 2022-04-02 NOTE — Anesthesia Procedure Notes (Addendum)
Procedure Name: Intubation Date/Time: 04/02/2022 3:05 PM  Performed by: Malva Cogan, CRNAPre-anesthesia Checklist: Patient identified, Patient being monitored, Timeout performed, Emergency Drugs available and Suction available Patient Re-evaluated:Patient Re-evaluated prior to induction Oxygen Delivery Method: Circle system utilized Preoxygenation: Pre-oxygenation with 100% oxygen Induction Type: IV induction Ventilation: Mask ventilation without difficulty Laryngoscope Size: McGraph and 4 Grade View: Grade I Tube type: Oral Tube size: 7.0 mm Number of attempts: 2 Airway Equipment and Method: Stylet Placement Confirmation: ETT inserted through vocal cords under direct vision, positive ETCO2 and breath sounds checked- equal and bilateral Secured at: 21 cm Tube secured with: Tape Dental Injury: Teeth and Oropharynx as per pre-operative assessment  Comments: Attempt #1 unsuccessful, grade I view, attempt #2 successful grade 1 view; placed by [employer]

## 2022-04-02 NOTE — Progress Notes (Signed)
    Attending Progress Note  History: Lance Green is here for cervical myelopathy  POD0: Lance Green is in PACU. He was initially having trouble moving his arms, but is now able to move.  HD6: No new events.  HD4: Neurosurgery consulted for myelopathy  HPI: Lance Green is a 46 y.o. male who presents with the chief complaint of Decreased hand coordination, balance issues.  This has been going on for more than 3 years.  No trauma.  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  Also problems with balance but no recent falls.  No fevers, chills.  Physical Exam: Vitals:   04/02/22 1817 04/02/22 1845  BP: (!) 161/93 (!) 171/103  Pulse: 90   Resp: (!) 24   Temp: 98 F (36.7 C) (!) 97.3 F (36.3 C)  SpO2: 100%     AA Ox3 CNI  Strength:3/5 BUE deltoids, interossei, grip, wrist extension. Moves lower extremities well Sensation diminished in hands and arms Cannot fully open hands  Data:  Recent Labs  Lab 03/29/22 0405 03/30/22 0550 04/01/22 0441 04/02/22 0420  NA 139  --  139 137  K 3.2*   < > 3.4* 4.2  CL 110  --  108 109  CO2 24  --  24 19*  BUN 7  --  8 6  CREATININE 0.70  --  0.67 0.78  GLUCOSE 140*  --  236* 198*  CALCIUM 9.1  --  9.0 9.0   < > = values in this interval not displayed.   Recent Labs  Lab 04/01/22 0441  AST 30  ALT 31  ALKPHOS 88     Recent Labs  Lab 03/28/22 2037 03/29/22 0405 04/01/22 0441  WBC 7.7 7.4 7.7  HGB 11.9* 11.3* 11.3*  HCT 35.1* 33.2* 32.9*  PLT 150 148* 196   Recent Labs  Lab 04/01/22 1730  INR 1.0         Other tests/results: MRI C spine shows severe stenosis C3-6  Assessment/Plan:  Lance Green is here for progressive cervical myelopathy. He underwent C3-6 ACDF.  He initially had some difficulty with movement, but showed good improvement in PACU.  - C Collar when OOB - pain control - DVT prophylaxis ok to start 6/29 - PTOT starting 6/29   Venetia Night  MD, [facility] Department of Neurosurgery

## 2022-04-03 ENCOUNTER — Encounter: Payer: Self-pay | Admitting: Neurosurgery

## 2022-04-03 LAB — BASIC METABOLIC PANEL
Anion gap: 11 (ref 5–15)
BUN: 10 mg/dL (ref 6–20)
CO2: 22 mmol/L (ref 22–32)
Calcium: 9.1 mg/dL (ref 8.9–10.3)
Chloride: 102 mmol/L (ref 98–111)
Creatinine, Ser: 0.79 mg/dL (ref 0.61–1.24)
GFR, Estimated: >60 mL/min (ref >60)
Glucose, Bld: 291 mg/dL — ABNORMAL HIGH (ref 70–99)
Potassium: 4.3 mmol/L (ref 3.5–5.1)
Sodium: 135 mmol/L (ref 135–145)

## 2022-04-03 LAB — CBC
HCT: 36.3 % — ABNORMAL LOW (ref 39.0–52.0)
Hemoglobin: 12.5 g/dL — ABNORMAL LOW (ref 13.0–17.0)
MCH: 32.2 pg (ref 26.0–34.0)
MCHC: 34.4 g/dL (ref 30.0–36.0)
MCV: 93.6 fL (ref 80.0–100.0)
Platelets: 285 10*3/uL (ref 150–400)
RBC: 3.88 MIL/uL — ABNORMAL LOW (ref 4.22–5.81)
RDW: 11.8 % (ref 11.5–15.5)
WBC: 12.3 10*3/uL — ABNORMAL HIGH (ref 4.0–10.5)
nRBC: 0 % (ref 0.0–0.2)

## 2022-04-03 LAB — GLUCOSE, CAPILLARY
Glucose-Capillary: 258 mg/dL — ABNORMAL HIGH (ref 70–99)
Glucose-Capillary: 192 mg/dL — ABNORMAL HIGH (ref 70–99)
Glucose-Capillary: 215 mg/dL — ABNORMAL HIGH (ref 70–99)
Glucose-Capillary: 282 mg/dL — ABNORMAL HIGH (ref 70–99)

## 2022-04-03 LAB — MAGNESIUM: Magnesium: 1.5 mg/dL — ABNORMAL LOW (ref 1.7–2.4)

## 2022-04-03 MED ORDER — CELECOXIB 200 MG PO CAPS
200.0000 mg | ORAL_CAPSULE | Freq: Two times a day (BID) | ORAL | Status: DC
Start: 2022-04-03 — End: 2022-04-09
  Administered 2022-04-03 – 2022-04-09 (×13): 200 mg via ORAL
  Filled 2022-04-03 (×13): qty 1

## 2022-04-03 MED ORDER — MAGNESIUM SULFATE 4 GM/100ML IV SOLN
4.0000 g | Freq: Once | INTRAVENOUS | Status: AC
  Administered 2022-04-03: 4 g via INTRAVENOUS
  Filled 2022-04-03: qty 100

## 2022-04-03 MED ORDER — OXYCODONE HCL 5 MG PO TABS
10.0000 mg | ORAL_TABLET | ORAL | Status: DC | PRN
  Administered 2022-04-03 – 2022-04-07 (×7): 10 mg via ORAL
  Filled 2022-04-03 (×7): qty 2

## 2022-04-03 MED ORDER — INSULIN GLARGINE-YFGN 100 UNIT/ML ~~LOC~~ SOLN
6.0000 [IU] | Freq: Every day | SUBCUTANEOUS | Status: DC
  Administered 2022-04-03 – 2022-04-09 (×7): 6 [IU] via SUBCUTANEOUS
  Filled 2022-04-03 (×7): qty 0.06

## 2022-04-03 NOTE — Progress Notes (Signed)
    Attending Progress Note  History: Lance Green is here for cervical myelopathy  POD1: He is doing much better.  Hand movement and strength are improved compared to pre-op. POD0: Lance Green is in PACU. He was initially having trouble moving his arms, but is now able to move.  HD6: No new events.  HD4: Neurosurgery consulted for myelopathy  HPI: Lance Green is a 46 y.o. male who presents with the chief complaint of Decreased hand coordination, balance issues.  This has been going on for more than 3 years.  No trauma.  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  Also problems with balance but no recent falls.  No fevers, chills.  Physical Exam: Vitals:   04/03/22 0443 04/03/22 0729  BP: (!) 158/98 (!) 172/104  Pulse: 99 (!) 103  Resp: 17 19  Temp: 98.4 F (36.9 C) 98.2 F (36.8 C)  SpO2: 100% 100%    AA Ox3 CNI  Strength:5/5 BUE deltoids, biceps, triceps. 4/5 interossei, grip, wrist extension. Moves lower extremities well 5/5 Sensation diminished in hands and arms  Cannot fully open hands but improved movement  Data:  Recent Labs  Lab 04/01/22 0441 04/02/22 0420 04/03/22 0317  NA 139 137 135  K 3.4* 4.2 4.3  CL 108 109 102  CO2 24 19* 22  BUN 8 6 10   CREATININE 0.67 0.78 0.79  GLUCOSE 236* 198* 291*  CALCIUM 9.0 9.0 9.1   Recent Labs  Lab 04/01/22 0441  AST 30  ALT 31  ALKPHOS 88     Recent Labs  Lab 03/29/22 0405 04/01/22 0441 04/03/22 0317  WBC 7.4 7.7 12.3*  HGB 11.3* 11.3* 12.5*  HCT 33.2* 32.9* 36.3*  PLT 148* 196 285   Recent Labs  Lab 04/01/22 1730  INR 1.0         Other tests/results: 6/23/23MRI C spine shows severe stenosis C3-6  Assessment/Plan:  03/30/22 is here for progressive cervical myelopathy. He underwent C3-6 ACDF.  His strength has improved  - C Collar when OOB - pain control with orals only - DVT prophylaxis ok to start 6/29 - PTOT starting today  7/29 MD, [facility] Department of Neurosurgery

## 2022-04-03 NOTE — Evaluation (Signed)
Occupational Therapy Evaluation Patient Details Name: Lance Green MRN: [id number] DOB: November 24, 1975 Today's Date: 04/03/2022   History of Present Illness Pt is s/p C3-6 ACDF 04/02/22 after presenting with progressive cervial myelopathy. C collar when OOB. PMH significant for ETOH, pancreatitis   Clinical Impression   Chart reivewed, pt greeted in chair agreeable to OT re-eval following surgery. C collar is donned as pt is OOB. Pt reports improved B hand use including improved strength and [place].  Pt continues to present with deficits in strength, activity tolerance, balance affecting safe and optimal ADL completion. Pt appears ataxic with standing and amb with RW household distances performing with CGA-MIN A. LB dressing completed with supervision, toileting (urinating over toilet) with CGA. Grooming tasks at sink level completed with CGA. Frequent vcs required throughout for safe ADL completion and technique. Pt will benefit from discharge to STR to address functional deficits. OT will continue to follow acutely. Pt is left in bedside chair, NAD, all needs met.     Recommendations for follow up therapy are one component of a multi-disciplinary discharge planning process, led by the attending physician.  Recommendations may be updated based on patient status, additional functional criteria and insurance authorization.   Follow Up Recommendations  Skilled nursing-short term rehab (<3 hours/day)    Assistance Recommended at Discharge Intermittent Supervision/Assistance  Patient can return home with the following Direct supervision/assist for medications management;Assist for transportation;Assistance with cooking/housework;A little help with walking and/or transfers;A little help with bathing/dressing/bathroom    Functional Status Assessment  Patient has had a recent decline in their functional status and demonstrates the ability to make significant improvements in function in a reasonable and  predictable amount of time.  Equipment Recommendations  BSC/3in1;Tub/shower seat    Recommendations for Other Services       Precautions / Restrictions Precautions Precautions: Fall Restrictions Weight Bearing Restrictions: No      Mobility Bed Mobility               General bed mobility comments: NT in chair pre/post session    Transfers Overall transfer level: Needs assistance Equipment used: Rolling walker (2 wheels) Transfers: Sit to/from Stand Sit to Stand: Min assist           General transfer comment: with RW, step by step vcs for technique      Balance Overall balance assessment: Needs assistance, History of Falls Sitting-balance support: Feet supported Sitting balance-Leahy Scale: Good     Standing balance support: Bilateral upper extremity supported, During functional activity, Reliant on assistive device for balance Standing balance-Leahy Scale: Poor                             ADL either performed or assessed with clinical judgement   ADL Overall ADL's : Needs assistance/impaired     Grooming: Wash/dry hands;Standing;Min guard;Minimal assistance               Lower Body Dressing: Set up;Supervision/safety Lower Body Dressing Details (indicate cue type and reason): sitting for donning/doffing socks, adhering to cervical precautions with vcs Toilet Transfer: Min guard;Minimal assistance;Ambulation;Rolling walker (2 wheels) Toilet Transfer Details (indicate cue type and reason): simulated to bedside chair Toileting- Clothing Manipulation and Hygiene: Min guard Toileting - Clothing Manipulation Details (indicate cue type and reason): standing at toilet to urinate, vcs for safety     Functional mobility during ADLs: Min guard;Minimal assistance;Rolling walker (2 wheels) (amb in room househoud distances, appears ataxic  throughout)       Vision Patient Visual Report: No change from baseline       Perception     Praxis       Pertinent Vitals/Pain Pain Assessment Pain Assessment: No/denies pain     Hand Dominance Right   Extremity/Trunk Assessment Upper Extremity Assessment Upper Extremity Assessment: RUE deficits/detail;LUE deficits/detail RUE Deficits / Details: fair strength throughout BUE, impaired FMC RUE Coordination: decreased fine motor;decreased gross motor LUE Coordination: decreased fine motor;decreased gross motor   Lower Extremity Assessment Lower Extremity Assessment: Generalized weakness   Cervical / Trunk Assessment Cervical / Trunk Assessment: Normal;Neck Surgery   Communication Communication Communication: No difficulties   Cognition Arousal/Alertness: Awake/alert Behavior During Therapy: WFL for tasks assessed/performed Overall Cognitive Status: Within Functional Limits for tasks assessed                                       General Comments       Exercises Other Exercises Other Exercises: edu re: role of OT, role of rehab, discharge recommendations, home safety, DME use, falls prevention   Shoulder Instructions      Home Living Family/patient expects to be discharged to:: Shelter/Homeless                                        Prior Functioning/Environment Prior Level of Function : Needs assist             Mobility Comments: pt reports fall history PTA even with using walker ADLs Comments: assist required with ADL/IADL due to balance and FMC deficits prior to surgery        OT Problem List: Decreased strength;Decreased coordination;Decreased range of motion;Impaired sensation;Decreased activity tolerance;Decreased safety awareness;Impaired balance (sitting and/or standing);Decreased knowledge of use of DME or AE;Decreased knowledge of precautions;Impaired UE functional use      OT Treatment/Interventions: Self-care/ADL training;Balance training;Therapeutic exercise;Therapeutic activities;Energy conservation;Cognitive  remediation/compensation;DME and/or AE instruction;Patient/family education;Manual therapy    OT Goals(Current goals can be found in the care plan section) Acute Rehab OT Goals Patient Stated Goal: get stronger OT Goal Formulation: With patient Time For Goal Achievement: 04/17/22 Potential to Achieve Goals: Good  OT Frequency: Min 2X/week    Co-evaluation              AM-PAC OT "6 Clicks" Daily Activity     Outcome Measure Help from another person eating meals?: None Help from another person taking care of personal grooming?: None Help from another person toileting, which includes using toliet, bedpan, or urinal?: A Little Help from another person bathing (including washing, rinsing, drying)?: A Little Help from another person to put on and taking off regular upper body clothing?: None Help from another person to put on and taking off regular lower body clothing?: A Little 6 Click Score: 21   End of Session Equipment Utilized During Treatment: Gait belt;Rolling walker (2 wheels) Nurse Communication: Mobility status  Activity Tolerance: Patient tolerated treatment well Patient left: in chair;with call bell/phone within reach;with chair alarm set  OT Visit Diagnosis: Unsteadiness on feet (R26.81);Repeated falls (R29.6);Muscle weakness (generalized) (M62.81)                Time: 1000-1012 OT Time Calculation (min): 12 min Charges:  OT General Charges $OT Visit: 1 Visit OT Evaluation $OT Re-eval: 1 Re-eval  Shanon Payor, OTD OTR/L  04/03/22, 10:27 AM

## 2022-04-03 NOTE — Evaluation (Signed)
Physical Therapy Evaluation Patient Details Name: Lance Green MRN: [id number] DOB: 1976-01-18 Today's Date: 04/03/2022  History of Present Illness  Pt is s/p C3-6 ACDF 04/02/22 after presenting with progressive cervial myelopathy. C collar when OOB. PMH significant for ETOH, pancreatitis.   Clinical Impression  Pt admitted with above diagnosis. Pt received upright in bed agreeable to PT services. Endorsing 8/10 pain post surgery. Reports improvement in gripping abilities in hands since operation. At baseline pt has had multiple falls despite use of RW and relies on fiance for assist in all ADL's/IADL's. Currently homeless.    To date, pt educated on log roll and able to perform with VC's throughout. Hard cervical collar already donned upon entry to room. Educated on need for hard collar with mobility and education on BLT precautions. Pt performing with supervision and HOB elevated and standing to RW with minA and VC's for hand placement. Upon standing ataxic episode noted that lasts ~30 sec. Requiring RW close to pt and minguard to minA from PT until episode subsides. Pt able to tolerate ~ 100' of gait with RW with decreased gait speed, step lengths and overall general LE weakness as evidenced by small bouts of LE's buckling throughput gait relying on standing rest breaks and overall heavy use of UE's on RW for support. Pt returning to recliner with safe RW sequencing with all needs in place. Overall pt remains at high risk of falls but is tolerating improved tolerance for OOB mobility post ACDF. Pt currently with functional limitations due to the deficits listed below (see PT Problem List). Pt will benefit from skilled PT to increase their independence and safety with mobility to allow discharge to the venue listed below.         Recommendations for follow up therapy are one component of a multi-disciplinary discharge planning process, led by the attending physician.  Recommendations may be updated  based on patient status, additional functional criteria and insurance authorization.  Follow Up Recommendations Skilled nursing-short term rehab (<3 hours/day) Can patient physically be transported by private vehicle: Yes    Assistance Recommended at Discharge Frequent or constant Supervision/Assistance  Patient can return home with the following  A lot of help with bathing/dressing/bathroom;Assistance with feeding;Help with stairs or ramp for entrance;Assist for transportation;Assistance with cooking/housework;A little help with walking and/or transfers    Equipment Recommendations Other (comment) (tbd by next venue of care)  Recommendations for Other Services       Functional Status Assessment Patient has had a recent decline in their functional status and demonstrates the ability to make significant improvements in function in a reasonable and predictable amount of time.     Precautions / Restrictions Precautions Precautions: Fall Required Braces or Orthoses: Cervical Brace Cervical Brace: Hard collar;At all times Restrictions Weight Bearing Restrictions: No      Mobility  Bed Mobility Overal bed mobility: Needs Assistance Bed Mobility: Supine to Sit     Supine to sit: Supervision, HOB elevated     General bed mobility comments: Education on log roll technique Patient Response: Cooperative  Transfers Overall transfer level: Needs assistance Equipment used: Rolling walker (2 wheels) Transfers: Sit to/from Stand Sit to Stand: Min assist           General transfer comment: with RW, step by step vcs for technique    Ambulation/Gait Ambulation/Gait assistance: Min guard Gait Distance (Feet): 100 Feet Assistive device: Rolling walker (2 wheels) Gait Pattern/deviations: Step-through pattern, Decreased stride length, Decreased step length - right, Decreased  step length - left, Trunk flexed       General Gait Details: Pt ambulating with RW and overall unsteady  due to LE weakness and minor buckling of limbs. Safe use of RW throughout  Stairs            Wheelchair Mobility    Modified Rankin (Stroke Patients Only)       Balance Overall balance assessment: Needs assistance, History of Falls Sitting-balance support: Feet supported Sitting balance-Leahy Scale: Good     Standing balance support: Bilateral upper extremity supported, During functional activity, Reliant on assistive device for balance Standing balance-Leahy Scale: Poor Standing balance comment: Ataxic episode present with transfer into standing taking 30 seconds to subside. Requires RW and PT assist for steadying.                             Pertinent Vitals/Pain Pain Assessment Pain Assessment: 0-10 Pain Score: 8  Pain Location: cervical spine Pain Descriptors / Indicators: Discomfort Pain Intervention(s): Monitored during session, Repositioned    Home Living Family/patient expects to be discharged to:: Shelter/Homeless Living Arrangements: Spouse/significant other Available Help at Discharge: Available 24 hours/day               Additional Comments: Reports due to being homeless planning to temporarily move into a friend's nursing home/assisted living apartment temporarily.    Prior Function Prior Level of Function : Needs assist       Physical Assist : Mobility (physical);ADLs (physical) Mobility (physical): Gait ADLs (physical): Grooming;Bathing;Dressing;Toileting;IADLs Mobility Comments: pt reports fall history PTA even with using walker ADLs Comments: assist required with ADL/IADL due to balance and FMC deficits prior to surgery     Hand Dominance   Dominant Hand: Right    Extremity/Trunk Assessment   Upper Extremity Assessment Upper Extremity Assessment: Generalized weakness;Defer to OT evaluation RUE Deficits / Details: fair strength throughout BUE, impaired [place] RUE Coordination: decreased fine motor;decreased gross motor LUE  Coordination: decreased fine motor;decreased gross motor    Lower Extremity Assessment Lower Extremity Assessment: Generalized weakness;RLE deficits/detail;LLE deficits/detail RLE Sensation: decreased light touch LLE Sensation: decreased light touch    Cervical / Trunk Assessment Cervical / Trunk Assessment: Normal;Neck Surgery  Communication   Communication: No difficulties  Cognition Arousal/Alertness: Awake/alert Behavior During Therapy: WFL for tasks assessed/performed Overall Cognitive Status: Within Functional Limits for tasks assessed                                 General Comments: Pt is very pleasant and cooperative        General Comments      Exercises Other Exercises Other Exercises: Role of PT in acute setting, d/c recs, log roll technique, orders for cervical collar, safe use of DME   Assessment/Plan    PT Assessment Patient needs continued PT services  PT Problem List Decreased strength;Decreased mobility;Decreased range of motion;Decreased activity tolerance;Decreased balance;Pain;Decreased knowledge of use of DME;Decreased coordination       PT Treatment Interventions DME instruction;Therapeutic exercise;Gait training;Balance training;Functional mobility training;Therapeutic activities;Patient/family education;Manual techniques    PT Goals (Current goals can be found in the Care Plan section)  Acute Rehab PT Goals Patient Stated Goal: improve mobility, find more stable living arrangements PT Goal Formulation: With patient Time For Goal Achievement: 04/17/22 Potential to Achieve Goals: Fair    Frequency Min 2X/week     Co-evaluation  AM-PAC PT "6 Clicks" Mobility  Outcome Measure Help needed turning from your back to your side while in a flat bed without using bedrails?: A Little Help needed moving from lying on your back to sitting on the side of a flat bed without using bedrails?: A Little Help needed moving to  and from a bed to a chair (including a wheelchair)?: A Little Help needed standing up from a chair using your arms (e.g., wheelchair or bedside chair)?: A Little Help needed to walk in hospital room?: A Lot Help needed climbing 3-5 steps with a railing? : Total 6 Click Score: 15    End of Session Equipment Utilized During Treatment: Gait belt Activity Tolerance: Patient tolerated treatment well Patient left: in chair;with call bell/phone within reach;with chair alarm set Nurse Communication: Mobility status PT Visit Diagnosis: Unsteadiness on feet (R26.81);Repeated falls (R29.6);Muscle weakness (generalized) (M62.81);Other abnormalities of gait and mobility (R26.89);Difficulty in walking, not elsewhere classified (R26.2);Other symptoms and signs involving the nervous system (K02.542)    Time: 7062-3762 PT Time Calculation (min) (ACUTE ONLY): 23 min   Charges:   PT Evaluation $PT Eval Moderate Complexity: 1 Mod PT Treatments $Gait Training: 8-22 mins      Delphia Grates. Fairly IV, PT, DPT Physical Therapist- [place]  [facility]  04/03/2022, 11:01 AM

## 2022-04-03 NOTE — Progress Notes (Signed)
  Progress Note   Patient: Lance Green PNT:[id number] DOB: 1976-01-21 DOA: 03/26/2022     6 DOS: the patient was seen and examined on 04/03/2022   Brief hospital course: MJ WILLIS is a 46 y.o. male who presents with chief complaint of decreased hand coordination, balance issues.  This has been going on for more than 3 years.  Cervical MRI showed cervical spine stenosis, most severe in C4-C5.  Patient had anterior cervical discectomy and fusion on C4-5, C5-6, and C3-4 on 6/28.  Assessment and Plan: Severe cervical spine stenosis. Patient is status post surgery.  Strength is getting better.  But still unsteady per physical therapy.  We will continue PT/OT.   Mechanical fall. Traumatic rhabdomyolysis secondary to fall. Condition improved.  Hypokalemia Hypomagnesemia. Give another 4 g of magnesium.  Hypomagnesemia seems to be refractory.   Hyperglycemia.  Uncontrolled type 2 diabetes. Glucose still running high, added lower dose insulin glargine.   Alcohol abuse. No evidence of withdrawal at this time       Subjective:  Feels stronger in his arms.  He was able to walk with physical therapy but unsteady.  Physical Exam: Vitals:   04/02/22 2003 04/02/22 2109 04/03/22 0443 04/03/22 0729  BP: (!) 167/107 (!) 158/90 (!) 158/98 (!) 172/104  Pulse: 83 80 99 (!) 103  Resp: 17 18 17 19   Temp: 98 F (36.7 C) 98.1 F (36.7 C) 98.4 F (36.9 C) 98.2 F (36.8 C)  TempSrc:  Oral  Oral  SpO2: 100% 100% 100% 100%  Weight:      Height:       General exam: Appears calm and comfortable  Respiratory system: Clear to auscultation. Respiratory effort normal. Cardiovascular system: S1 & S2 heard, RRR. No JVD, murmurs, rubs, gallops or clicks. No pedal edema. Gastrointestinal system: Abdomen is nondistended, soft and nontender. No organomegaly or masses felt. Normal bowel sounds heard. Central nervous system: Alert and oriented. No focal neurological deficits. Extremities: Symmetric  5 x 5 power. Skin: No rashes, lesions or ulcers Psychiatry: Judgement and insight appear normal. Mood & affect appropriate.   Data Reviewed:  Reviewed surgical note, lab results  Family Communication:   Disposition: Status is: Inpatient Remains inpatient appropriate because: Severity of disease.  Planned Discharge Destination:  Pending    Time spent: 35 minutes  Author: , MD 04/03/2022 10:11 AM  For on call review [email or web address].

## 2022-04-04 DIAGNOSIS — F1010 Alcohol abuse, uncomplicated: Secondary | ICD-10-CM

## 2022-04-04 LAB — MAGNESIUM: Magnesium: 1.6 mg/dL — ABNORMAL LOW (ref 1.7–2.4)

## 2022-04-04 LAB — GLUCOSE, CAPILLARY
Glucose-Capillary: 118 mg/dL — ABNORMAL HIGH (ref 70–99)
Glucose-Capillary: 151 mg/dL — ABNORMAL HIGH (ref 70–99)
Glucose-Capillary: 219 mg/dL — ABNORMAL HIGH (ref 70–99)
Glucose-Capillary: 145 mg/dL — ABNORMAL HIGH (ref 70–99)

## 2022-04-04 MED ORDER — MAGNESIUM SULFATE 4 GM/100ML IV SOLN
4.0000 g | Freq: Once | INTRAVENOUS | Status: AC
  Administered 2022-04-04: 4 g via INTRAVENOUS
  Filled 2022-04-04: qty 100

## 2022-04-04 NOTE — Progress Notes (Signed)
Occupational Therapy Treatment Patient Details Name: Lance Green MRN: [id number] DOB: 1975-12-19 Today's Date: 04/04/2022   History of present illness Pt is s/p C3-6 ACDF 04/02/22 after presenting with progressive cervial myelopathy. C collar when OOB. PMH significant for ETOH, pancreatitis   OT comments  Chart reviewed, pt greeted in room agreeable to OT tx session. MAX A required for C collar donning with step by step vcs provided. STS completed with supervision, amb within room and hallway with RW with supervision-CGA 200'. Pt reports he completed UB/LB dressing with SET UP prior to session. HEP hand out provided to improve B hand FMC/hand strength. Pt is left in bed, NAD, all needs met. OT will follow acutely.    Recommendations for follow up therapy are one component of a multi-disciplinary discharge planning process, led by the attending physician.  Recommendations may be updated based on patient status, additional functional criteria and insurance authorization.    Follow Up Recommendations  Skilled nursing-short term rehab (<3 hours/day)    Assistance Recommended at Discharge Intermittent Supervision/Assistance  Patient can return home with the following  Direct supervision/assist for medications management;Assist for transportation;Assistance with cooking/housework;A little help with walking and/or transfers;A little help with bathing/dressing/bathroom   Equipment Recommendations  BSC/3in1;Tub/shower seat    Recommendations for Other Services      Precautions / Restrictions Precautions Precautions: Fall Required Braces or Orthoses: Cervical Brace Cervical Brace: Hard collar (c collar when oob)       Mobility Bed Mobility Overal bed mobility: Needs Assistance Bed Mobility: Supine to Sit, Sit to Supine     Supine to sit: Supervision, HOB elevated Sit to supine: Supervision, HOB elevated   General bed mobility comments: fair body mechanics    Transfers Overall  transfer level: Needs assistance Equipment used: Rolling walker (2 wheels) Transfers: Sit to/from Stand Sit to Stand: Supervision                 Balance Overall balance assessment: Needs assistance, History of Falls Sitting-balance support: Feet supported Sitting balance-Leahy Scale: Good     Standing balance support: Bilateral upper extremity supported, During functional activity, Reliant on assistive device for balance Standing balance-Leahy Scale: Fair                             ADL either performed or assessed with clinical judgement   ADL Overall ADL's : Needs assistance/impaired                                     Functional mobility during ADLs: Supervision/safety;Min guard (approx 200' with RW) General ADL Comments: MAX A for collar with step by step vcs, supervision for toilet transfer with RW    Extremity/Trunk Assessment              Vision       Perception     Praxis      Cognition Arousal/Alertness: Awake/alert Behavior During Therapy: WFL for tasks assessed/performed Overall Cognitive Status: Within Functional Limits for tasks assessed                                          Exercises Other Exercises Other Exercises: provided [facility] hand out to improve B hand function    Shoulder Instructions  General Comments      Pertinent Vitals/ Pain       Pain Assessment Pain Assessment: No/denies pain  Home Living                                          Prior Functioning/Environment              Frequency  Min 3X/week        Progress Toward Goals  OT Goals(current goals can now be found in the care plan section)  Progress towards OT goals: Progressing toward goals     Plan Discharge plan remains appropriate    Co-evaluation                 AM-PAC OT "6 Clicks" Daily Activity     Outcome Measure   Help from another person eating meals?:  None Help from another person taking care of personal grooming?: None Help from another person toileting, which includes using toliet, bedpan, or urinal?: A Little Help from another person bathing (including washing, rinsing, drying)?: A Little Help from another person to put on and taking off regular upper body clothing?: None Help from another person to put on and taking off regular lower body clothing?: A Little 6 Click Score: 21    End of Session Equipment Utilized During Treatment: Gait belt;Rolling walker (2 wheels)  OT Visit Diagnosis: Unsteadiness on feet (R26.81);Repeated falls (R29.6);Muscle weakness (generalized) (M62.81)   Activity Tolerance Patient tolerated treatment well   Patient Left in bed;with call bell/phone within reach   Nurse Communication Mobility status        Time: 9810-2548 OT Time Calculation (min): 21 min  Charges: OT General Charges $OT Visit: 1 Visit OT Treatments $Therapeutic Activity: 8-22 mins  Shanon Payor, OTD OTR/L  04/04/22, 11:39 AM

## 2022-04-04 NOTE — Progress Notes (Addendum)
Physical Therapy Treatment Patient Details Name: Lance Green MRN: [id number] DOB: 05-01-1976 Today's Date: 04/04/2022   History of Present Illness Pt is s/p C3-6 ACDF 04/02/22 after presenting with progressive cervial myelopathy. C collar when OOB. PMH significant for ETOH, pancreatitis    PT Comments    Pt was long sitting in bed upon arriving. He is A and O x 4 and extremely cooperative and motivated. Author notice broken cervical collar and had it replaced. Pt was able to exit bed, stand, and ambulate with RW. Occasional unsteadiness during ambulation however no physical assistance or intervention required. Pt was repositioned in bed post session with call bell in reach and pt resting comfortably. Lengthy discussion about needing to slow down with all OOB activity. He states understanding and reports overall feeling well. Recommend DC to SNF to improve safety with all ADLs.    Recommendations for follow up therapy are one component of a multi-disciplinary discharge planning process, led by the attending physician.  Recommendations may be updated based on patient status, additional functional criteria and insurance authorization.  Follow Up Recommendations  Follow physician's recommendations for discharge plan and follow up therapies     Assistance Recommended at Discharge Frequent or constant Supervision/Assistance  Patient can return home with the following A lot of help with bathing/dressing/bathroom;Assistance with feeding;Help with stairs or ramp for entrance;Assist for transportation;Assistance with cooking/housework;A little help with walking and/or transfers   Equipment Recommendations  Rolling walker (2 wheels);BSC/3in1       Precautions / Restrictions Precautions Precautions: Fall Cervical Brace: Hard collar;Other (comment) (whern OOB. replaced cervical collar due to previous collar being broken) Restrictions Weight Bearing Restrictions: No     Mobility  Bed  Mobility Overal bed mobility: Needs Assistance Bed Mobility: Supine to Sit, Sit to Supine     Supine to sit: Supervision, HOB elevated Sit to supine: Supervision, HOB elevated   General bed mobility comments: reviewed importance of keeping cervical spine in proper alignment when transitioning from supine to EOB short sit    Transfers Overall transfer level: Needs assistance Equipment used: Rolling walker (2 wheels) Transfers: Sit to/from Stand Sit to Stand: Supervision     Ambulation/Gait Ambulation/Gait assistance: Min guard, Supervision Gait Distance (Feet): 250 Feet Assistive device: Rolling walker (2 wheels) Gait Pattern/deviations: Step-through pattern, Narrow base of support Gait velocity: decreased     General Gait Details: pt was easily able to ambulate 250 ft with RW. does require vcs for posture correction and increasing BOS for improved dynamic balance    Balance Overall balance assessment: Needs assistance, History of Falls Sitting-balance support: Feet supported Sitting balance-Leahy Scale: Good     Standing balance support: Bilateral upper extremity supported, During functional activity, Reliant on assistive device for balance Standing balance-Leahy Scale: Fair Standing balance comment: occasional unsteadiness however no assistance or intervention required       Cognition Arousal/Alertness: Awake/alert Behavior During Therapy: WFL for tasks assessed/performed Overall Cognitive Status: Within Functional Limits for tasks assessed      General Comments: Pt is very pleasant and cooperative               Pertinent Vitals/Pain Pain Assessment Pain Assessment: 0-10 Pain Score: 3  Faces Pain Scale: Hurts a little bit Pain Location: cervical spine Pain Descriptors / Indicators: Discomfort Pain Intervention(s): Limited activity within patient's tolerance, Monitored during session, Premedicated before session, Repositioned     PT Goals (current goals  can now be found in the care plan section) Acute Rehab PT  Goals Patient Stated Goal: get better and find a place to live to go to Progress towards PT goals: Progressing toward goals    Frequency    Min 2X/week      PT Plan Discharge plan needs to be updated       AM-PAC PT "6 Clicks" Mobility   Outcome Measure  Help needed turning from your back to your side while in a flat bed without using bedrails?: A Little Help needed moving from lying on your back to sitting on the side of a flat bed without using bedrails?: A Little Help needed moving to and from a bed to a chair (including a wheelchair)?: A Little Help needed standing up from a chair using your arms (e.g., wheelchair or bedside chair)?: A Little Help needed to walk in hospital room?: A Little Help needed climbing 3-5 steps with a railing? : A Little 6 Click Score: 18    End of Session   Activity Tolerance: Patient tolerated treatment well Patient left: in bed;with call bell/phone within reach;with bed alarm set;with SCD's reapplied;with family/visitor present Nurse Communication: Mobility status PT Visit Diagnosis: Unsteadiness on feet (R26.81);Repeated falls (R29.6);Muscle weakness (generalized) (M62.81);Other abnormalities of gait and mobility (R26.89);Difficulty in walking, not elsewhere classified (R26.2);Other symptoms and signs involving the nervous system (R29.898)     Time: 1941-7408 PT Time Calculation (min) (ACUTE ONLY): 14 min  Charges:  $Gait Training: 8-22 mins                    Jetta Lout PTA 04/04/22, 3:37 PM

## 2022-04-04 NOTE — TOC Progression Note (Signed)
Transition of Care [facility]) - Progression Note    Patient Details  Name: Lance Green MRN: [id number] Date of Birth: 1976-05-28  Transition of Care [facility]) CM/SW Contact  Caryn Section, RN Phone Number: 04/04/2022, 4:27 PM  Clinical Narrative:   Patient has no bed offers at this time and continues to work on strengthening with therapy.  SNF requests re-sent today including current therapy notes.  TOC to follow.    Expected Discharge Plan: Skilled Nursing Facility Barriers to Discharge: SNF Pending bed offer  Expected Discharge Plan and Services Expected Discharge Plan: Skilled Nursing Facility   Discharge Planning Services: CM Consult Post Acute Care Choice: Skilled Nursing Facility Living arrangements for the past 2 months: Homeless                 DME Arranged: N/A DME Agency: NA       HH Arranged: NA HH Agency: NA         Social Determinants of Health (SDOH) Interventions    Readmission Risk Interventions     No data to display

## 2022-04-04 NOTE — Progress Notes (Signed)
PT Cancellation Note  Patient Details Name: Lance Green MRN: [id number] DOB: 1976/01/18   Cancelled Treatment:     Pt eating lunch upon arrival, politely declined after ambulating with OT earlier. Pt states he is making great functional improvement and was able to get up and bathe himself this am. Continue PT per POC next available date/time.   Jannet Askew 04/04/2022, 1:23 PM

## 2022-04-04 NOTE — Plan of Care (Signed)

## 2022-04-04 NOTE — Progress Notes (Signed)
    Attending Progress Note  History: CREWE HEATHMAN is here for cervical myelopathy  POD2: Continues to improve. He is having expected dysphagia, but can swallow and talk.   POD1: He is doing much better.  Hand movement and strength are improved compared to pre-op. POD0: Mr. Corro is in PACU. He was initially having trouble moving his arms, but is now able to move.  HD6: No new events.  HD4: Neurosurgery consulted for myelopathy  HPI: ORREN PIETSCH is a 46 y.o. male who presents with the chief complaint of Decreased hand coordination, balance issues.  This has been going on for more than 3 years.  No trauma.  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  Also problems with balance but no recent falls.  No fevers, chills.  Physical Exam: Vitals:   04/04/22 0526 04/04/22 0806  BP: 126/81 (!) 152/94  Pulse: 98 95  Resp: 18 18  Temp: 98.2 F (36.8 C) 99 F (37.2 C)  SpO2: 100% 98%    AA Ox3 CNI  Strength:5/5 BUE deltoids, biceps, triceps. 4/5 interossei, 4+grip, wrist extension. Moves lower extremities well 5/5 Sensation diminished in hands and arms  Cannot fully open hands but improved movement Neck incision c/d/I. Trachea midline.  Data:  Recent Labs  Lab 04/01/22 0441 04/02/22 0420 04/03/22 0317  NA 139 137 135  K 3.4* 4.2 4.3  CL 108 109 102  CO2 24 19* 22  BUN 8 6 10   CREATININE 0.67 0.78 0.79  GLUCOSE 236* 198* 291*  CALCIUM 9.0 9.0 9.1   Recent Labs  Lab 04/01/22 0441  AST 30  ALT 31  ALKPHOS 88     Recent Labs  Lab 03/29/22 0405 04/01/22 0441 04/03/22 0317  WBC 7.4 7.7 12.3*  HGB 11.3* 11.3* 12.5*  HCT 33.2* 32.9* 36.3*  PLT 148* 196 285   Recent Labs  Lab 04/01/22 1730  INR 1.0         Other tests/results: 6/23/23MRI C spine shows severe stenosis C3-6  Assessment/Plan:  03/30/22 is here for progressive cervical myelopathy. He underwent C3-6 ACDF.  His strength has improved  - C  Collar when OOB - pain control with orals only - DVT prophylaxis - PTOT  [name] MD, [facility] Department of Neurosurgery

## 2022-04-04 NOTE — Evaluation (Signed)
Clinical/Bedside Swallow Evaluation Patient Details  Name: Lance Green MRN: [id number] Date of Birth: 1976/01/31  Today's Date: 04/04/2022 Time: SLP Start Time (ACUTE ONLY): 1206 SLP Stop Time (ACUTE ONLY): 1226 SLP Time Calculation (min) (ACUTE ONLY): 20 min  Past Medical History:  Past Medical History:  Diagnosis Date   Alcohol abuse    HTN (hypertension)    Neuropathy    Pancreatitis    Peptic ulcer disease    Past Surgical History:  Past Surgical History:  Procedure Laterality Date   ANTERIOR CERVICAL DECOMP/DISCECTOMY FUSION N/A 04/02/2022   Procedure: C3-6 ANTERIOR CERVICAL DECOMPRESSION/DISCECTOMY FUSION 3 LEVELS;  Surgeon: Venetia Night, MD;  Location: [facility] ORS;  Service: Neurosurgery;  Laterality: N/A;   APPENDECTOMY     INCISION AND DRAINAGE Left 11/11/2018   Procedure: INCISION AND DRAINAGE;  Surgeon: Donato Heinz, MD;  Location: [facility] ORS;  Service: Orthopedics;  Laterality: Left;   LAPAROSCOPIC APPENDECTOMY N/A 07/06/2018   Procedure: APPENDECTOMY LAPAROSCOPIC;  Surgeon: Carolan Shiver, MD;  Location: [facility] ORS;  Service: General;  Laterality: N/A;   HPI:  Lance Green is a 46 y.o. male who presents with chief complaint of decreased hand coordination, balance issues.  This has been going on for more than 3 years.  Cervical MRI showed cervical spine stenosis, most severe in C4-C5.  Patient had anterior cervical discectomy and fusion on C4-5, C5-6, and C3-4 on 6/28.    Assessment / Plan / Recommendation  Clinical Impression  Pt presents with expected mild pharyngeal phase dysphagia related to recent ACDF. When consuming thin liquids pt's swallow appeared more effortful and he commented that "it won't go down." No pervasive s/s of aspiration observed. However when consuming nectar thick liquids and honey thick liquids via cup, pt demonstrated functional pharyngeal abilities and stated, "man that goes down great, thank you so much, man that is wonderful." Pt  consumed 4 ounces of nectar thick liquids in excitement. Regular solids were not assessed as pt pleasantly decline so dysphagia 2 diet with nectar thick liquids is recommended, medicine crushed in puree. ST to follow and reassess for ease of swallow as pt's continues healing from recent ACDF. SLP Visit Diagnosis: Dysphagia, pharyngeal phase (R13.13)    Aspiration Risk  Mild aspiration risk    Diet Recommendation Dysphagia 2 (Fine chop);Nectar-thick liquid   Liquid Administration via: Cup Medication Administration: Crushed with puree Compensations: Minimize environmental distractions;Slow rate;Small sips/bites Postural Changes: Seated upright at 90 degrees;Remain upright for at least 30 minutes after po intake    Other  Recommendations Oral Care Recommendations: Oral care BID Other Recommendations: Order thickener from pharmacy;Prohibited food (jello, ice cream, thin soups);Remove water pitcher    Recommendations for follow up therapy are one component of a multi-disciplinary discharge planning process, led by the attending physician.  Recommendations may be updated based on patient status, additional functional criteria and insurance authorization.  Follow up Recommendations No SLP follow up      Assistance Recommended at Discharge None  Functional Status Assessment Patient has had a recent decline in their functional status and demonstrates the ability to make significant improvements in function in a reasonable and predictable amount of time.  Frequency and Duration min 2x/week  2 weeks       Prognosis Prognosis for Safe Diet Advancement: Good      Swallow Study   General Date of Onset: 04/02/22 HPI: Lance Green is a 46 y.o. male who presents with chief complaint of decreased hand coordination, balance issues.  This has been going on for more than 3 years.  Cervical MRI showed cervical spine stenosis, most severe in C4-C5.  Patient had anterior cervical discectomy and fusion  on C4-5, C5-6, and C3-4 on 6/28. Type of Study: Bedside Swallow Evaluation Previous Swallow Assessment: none in chrt Diet Prior to this Study: Regular;Thin liquids Temperature Spikes Noted: No Respiratory Status: Room air History of Recent Intubation: Yes Length of Intubations (days):  (ACDF surgery) Date extubated: 04/02/22 Behavior/Cognition: Alert;Cooperative;Pleasant mood Oral Cavity Assessment: Within Functional Limits Oral Care Completed by SLP: No Vision: Functional for self-feeding Self-Feeding Abilities: Needs assist;Needs set up;Able to feed self Patient Positioning: Upright in bed Baseline Vocal Quality: Normal Volitional Cough: Strong Volitional Swallow: Able to elicit    Oral/Motor/Sensory Function Overall Oral Motor/Sensory Function: Within functional limits   Ice Chips Ice chips: Not tested   Thin Liquid Thin Liquid: Within functional limits Presentation: Cup Other Comments: "that is really hard to swallow, doesn't feel like it is going to go down"    Nectar Thick Nectar Thick Liquid: Within functional limits Presentation: Cup;Self Fed   Honey Thick Honey Thick Liquid: Within functional limits Presentation: Cup;Self fed   Puree Puree: Within functional limits Presentation: Self Fed;Spoon   Solid     Solid: Not tested     Luke Rigsbee B. Dreama Saa, M.S., CCC-SLP, Tree surgeon Certified Brain Injury Specialist [facility]  [facility] Office [phone] Ascom [phone] Fax [phone]

## 2022-04-04 NOTE — Progress Notes (Signed)
  Progress Note   Patient: Lance Green UEA:[id number] DOB: 08-18-1976 DOA: 03/26/2022     7 DOS: the patient was seen and examined on 04/04/2022   Brief hospital course: Lance Green is a 46 y.o. male who presents with chief complaint of decreased hand coordination, balance issues.  This has been going on for more than 3 years.  Cervical MRI showed cervical spine stenosis, most severe in C4-C5.  Patient had anterior cervical discectomy and fusion on C4-5, C5-6, and C3-4 on 6/28. 5/30 ED evaluated by PT/OT, recommended nursing home placement.  Assessment and Plan: Severe cervical spine stenosis. Patient is status post surgery.  Evaluated by PT/OT, recommend nursing home placement.  Dysphagia. Evaluate by speech therapy, on dysphagia 2 diet. Per neurosurgery, dysphagia is expected postop.  No need for further work-up.  Mechanical fall. Traumatic rhabdomyolysis secondary to fall. PT/OT  Hypokalemia Hypomagnesemia. Magnesium 1.6, give additional 4 g of magnesium sulfate.   Hyperglycemia.  Uncontrolled type 2 diabetes. Glucose is better today, continue current regimen.    Alcohol abuse. No evidence of withdrawal at this time        Subjective:  Patient is complaining of some dysphagia, weakness is better  Physical Exam: Vitals:   04/03/22 1604 04/03/22 2047 04/04/22 0526 04/04/22 0806  BP: 132/77 (!) 145/96 126/81 (!) 152/94  Pulse: (!) 105 (!) 106 98 95  Resp: 20 18 18 18   Temp: 99.1 F (37.3 C) 98 F (36.7 C) 98.2 F (36.8 C) 99 F (37.2 C)  TempSrc:      SpO2: 100% 98% 100% 98%  Weight:      Height:       General exam: Appears calm and comfortable  Respiratory system: Clear to auscultation. Respiratory effort normal. Cardiovascular system: S1 & S2 heard, RRR. No JVD, murmurs, rubs, gallops or clicks. No pedal edema. Gastrointestinal system: Abdomen is nondistended, soft and nontender. No organomegaly or masses felt. Normal bowel sounds heard. Central  nervous system: Alert and oriented. No focal neurological deficits. Extremities: Symmetric 5 x 5 power. Skin: No rashes, lesions or ulcers Psychiatry: Judgement and insight appear normal. Mood & affect appropriate.   Data Reviewed:  Lab results reviewed.  Family Communication: None  Disposition: Status is: Inpatient Remains inpatient appropriate because: Unsafe discharge.  Planned Discharge Destination: Skilled nursing facility    Time spent: 35 minutes  Author: , MD 04/04/2022 1:17 PM  For on call review [email or web address].

## 2022-04-05 DIAGNOSIS — R1312 Dysphagia, oropharyngeal phase: Secondary | ICD-10-CM

## 2022-04-05 DIAGNOSIS — R1310 Dysphagia, unspecified: Secondary | ICD-10-CM

## 2022-04-05 LAB — BASIC METABOLIC PANEL
Anion gap: 10 (ref 5–15)
BUN: 8 mg/dL (ref 6–20)
CO2: 25 mmol/L (ref 22–32)
Calcium: 9.9 mg/dL (ref 8.9–10.3)
Chloride: 102 mmol/L (ref 98–111)
Creatinine, Ser: 0.81 mg/dL (ref 0.61–1.24)
GFR, Estimated: >60 mL/min (ref >60)
Glucose, Bld: 128 mg/dL — ABNORMAL HIGH (ref 70–99)
Potassium: 4.3 mmol/L (ref 3.5–5.1)
Sodium: 137 mmol/L (ref 135–145)

## 2022-04-05 LAB — GLUCOSE, CAPILLARY
Glucose-Capillary: 126 mg/dL — ABNORMAL HIGH (ref 70–99)
Glucose-Capillary: 156 mg/dL — ABNORMAL HIGH (ref 70–99)
Glucose-Capillary: 118 mg/dL — ABNORMAL HIGH (ref 70–99)
Glucose-Capillary: 169 mg/dL — ABNORMAL HIGH (ref 70–99)

## 2022-04-05 LAB — MAGNESIUM: Magnesium: 1.9 mg/dL (ref 1.7–2.4)

## 2022-04-05 NOTE — Progress Notes (Signed)
  Progress Note   Patient: Lance Green EXN:[id number] DOB: 1976/06/10 DOA: 03/26/2022     8 DOS: the patient was seen and examined on 04/05/2022   Brief hospital course: Lance Green is a 46 y.o. male who presents with chief complaint of decreased hand coordination, balance issues.  This has been going on for more than 3 years.  Cervical MRI showed cervical spine stenosis, most severe in C4-C5.  Patient had anterior cervical discectomy and fusion on C4-5, C5-6, and C3-4 on 6/28. 5/30 ED evaluated by PT/OT, recommended nursing home placement.  Assessment and Plan: Severe cervical spine stenosis. Dysphagia. Patient appears to be better with ambulation, weakness is better.  Continue PT/OT. Patient still has significant dysphagia, but was able to tolerate diet.  This is expected from surgery.  Mechanical fall. Traumatic rhabdomyolysis secondary to fall. Patient is more steady on his feet  Hypokalemia Hypomagnesemia. Condition improved   Hyperglycemia.  Uncontrolled type 2 diabetes. Glucose is better today, continue current regimen.    Alcohol abuse. No evidence of withdrawal at this time       Subjective:  Patient still has significant dysphagia, but feels better.  Was able to tolerate diet.  No aspiration. Weakness is much better.  Physical Exam: Vitals:   04/04/22 0526 04/04/22 0806 04/04/22 2056 04/05/22 0840  BP: 126/81 (!) 152/94 (!) 146/93 110/84  Pulse: 98 95 (!) 108 (!) 107  Resp: 18 18 18 18   Temp: 98.2 F (36.8 C) 99 F (37.2 C) 98.8 F (37.1 C) 98.9 F (37.2 C)  TempSrc:      SpO2: 100% 98% 99% 94%  Weight:      Height:       General exam: Appears calm and comfortable  Respiratory system: Clear to auscultation. Respiratory effort normal. Cardiovascular system: S1 & S2 heard, RRR. No JVD, murmurs, rubs, gallops or clicks. No pedal edema. Gastrointestinal system: Abdomen is nondistended, soft and nontender. No organomegaly or masses felt. Normal bowel  sounds heard. Central nervous system: Alert and oriented. No focal neurological deficits. Extremities: Symmetric 5 x 5 power. Skin: No rashes, lesions or ulcers Psychiatry: Judgement and insight appear normal. Mood & affect appropriate.   Data Reviewed:  Lab results reviewed  Family Communication: None  Disposition: Status is: Inpatient Remains inpatient appropriate because: Severity of disease, unsafe discharge  Planned Discharge Destination: Skilled nursing facility    Time spent: 35 minutes  Author: , MD 04/05/2022 12:20 PM  For on call review [email or web address].

## 2022-04-05 NOTE — Progress Notes (Signed)
    Attending Progress Note  History: Lance Green is here for cervical myelopathy  POD3: Steady improvements.  POD2: Continues to improve. He is having expected dysphagia, but can swallow and talk.   POD1: He is doing much better.  Hand movement and strength are improved compared to pre-op. POD0: Lance Green is in PACU. He was initially having trouble moving his arms, but is now able to move.  HD6: No new events.  HD4: Neurosurgery consulted for myelopathy  HPI: Lance Green is a 46 y.o. male who presents with the chief complaint of Decreased hand coordination, balance issues.  This has been going on for more than 3 years.  No trauma.  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  Also problems with balance but no recent falls.  No fevers, chills.  Physical Exam: Vitals:   04/05/22 1622 04/05/22 2010  BP: 126/75 130/85  Pulse: (!) 101 (!) 103  Resp: 18 19  Temp: 99.4 F (37.4 C) 98.6 F (37 C)  SpO2: 100% 99%    AA Ox3 CNI  Strength:5/5 BUE deltoids, biceps, triceps. 4/5 interossei, 4+grip, wrist extension. Moves lower extremities well 5/5 Sensation diminished in hands and arms  Cannot fully open hands but improved movement Neck incision c/d/I. Trachea midline.  Data:  Recent Labs  Lab 04/02/22 0420 04/03/22 0317 04/05/22 0652  NA 137 135 137  K 4.2 4.3 4.3  CL 109 102 102  CO2 19* 22 25  BUN 6 10 8   CREATININE 0.78 0.79 0.81  GLUCOSE 198* 291* 128*  CALCIUM 9.0 9.1 9.9   Recent Labs  Lab 04/01/22 0441  AST 30  ALT 31  ALKPHOS 88     Recent Labs  Lab 04/01/22 0441 04/03/22 0317  WBC 7.7 12.3*  HGB 11.3* 12.5*  HCT 32.9* 36.3*  PLT 196 285   Recent Labs  Lab 04/01/22 1730  INR 1.0         Other tests/results: 6/23/23MRI C spine shows severe stenosis C3-6  Assessment/Plan:  03/30/22 is here for progressive cervical myelopathy. He underwent C3-6 ACDF.  His strength has improved. Swallowing  slowly improving, currently preferring thick liquids.   - C Collar when OOB - pain control with orals only - DVT prophylaxis - PTOT  [name], MD

## 2022-04-06 LAB — GLUCOSE, CAPILLARY
Glucose-Capillary: 205 mg/dL — ABNORMAL HIGH (ref 70–99)
Glucose-Capillary: 104 mg/dL — ABNORMAL HIGH (ref 70–99)
Glucose-Capillary: 143 mg/dL — ABNORMAL HIGH (ref 70–99)
Glucose-Capillary: 137 mg/dL — ABNORMAL HIGH (ref 70–99)

## 2022-04-06 NOTE — Progress Notes (Signed)
    Attending Progress Note  History: Lance Green is here for cervical myelopathy  POD4: Soft swelling noted at the incision, no changes in color and no pain. No Drainage POD3: Steady improvements.  POD2: Continues to improve. He is having expected dysphagia, but can swallow and talk.   POD1: He is doing much better.  Hand movement and strength are improved compared to pre-op. POD0: Lance Green is in PACU. He was initially having trouble moving his arms, but is now able to move.  HD6: No new events.  HD4: Neurosurgery consulted for myelopathy  HPI: Lance Green is a 46 y.o. male who presents with the chief complaint of Decreased hand coordination, balance issues.  This has been going on for more than 3 years.  No trauma.  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  Also problems with balance but no recent falls.  No fevers, chills.  Physical Exam: Vitals:   04/06/22 0856 04/06/22 1629  BP: 139/82 (!) 139/91  Pulse: 93 97  Resp: 18 18  Temp: (!) 97.5 F (36.4 C) 98.6 F (37 C)  SpO2: 100% 100%    AA Ox3 CNI  Strength:5/5 BUE deltoids, biceps, triceps. 4/5 interossei, 4+grip, wrist extension. Moves lower extremities well 5/5 Sensation diminished in hands and arms  Cannot fully open hands but improved movement Neck incision c/d/I. Trachea midline. Soft swelling noted, non tender, non draining, no redness.   Data:  Recent Labs  Lab 04/02/22 0420 04/03/22 0317 04/05/22 0652  NA 137 135 137  K 4.2 4.3 4.3  CL 109 102 102  CO2 19* 22 25  BUN 6 10 8   CREATININE 0.78 0.79 0.81  GLUCOSE 198* 291* 128*  CALCIUM 9.0 9.1 9.9   Recent Labs  Lab 04/01/22 0441  AST 30  ALT 31  ALKPHOS 88     Recent Labs  Lab 04/01/22 0441 04/03/22 0317  WBC 7.7 12.3*  HGB 11.3* 12.5*  HCT 32.9* 36.3*  PLT 196 285   Recent Labs  Lab 04/01/22 1730  INR 1.0         Other tests/results: 6/23/23MRI C spine shows severe stenosis  C3-6  Assessment/Plan:  03/30/22 is here for progressive cervical myelopathy. He underwent C3-6 ACDF.  His strength has improved. Swallowing slowly improving, currently preferring thick liquids.   - Continue to monitor neck incision, if worsens or he complains of discomfort plan for CT - C Collar when OOB - pain control with orals only - DVT prophylaxis - PTOT  [name], MD

## 2022-04-06 NOTE — Progress Notes (Signed)
   04/05/22 2100  Mobility  HOB Elevated/Bed Position Self regulated  Activity Ambulated independently in room;Ambulated independently to bathroom  Range of Motion/Exercises Active;All extremities  Level of Assistance Standby assist, set-up cues, supervision of patient - no hands on  Assistive Device Front wheel walker  Distance Ambulated (ft) 20 ft  Activity Response Tolerated well

## 2022-04-06 NOTE — Plan of Care (Signed)
  Problem: Activity: Goal: Risk for activity intolerance will decrease Outcome: Progressing  Functional mobility to bathroom using a wheeled walker independently. Problem: Nutrition: Goal: Adequate nutrition will be maintained Outcome: Progressing  Patient tolerates whole pills with thickened liquid. Problem: Elimination: Goal: Will not experience complications related to bowel motility Outcome: Progressing Goal: Will not experience complications related to urinary retention Outcome: Progressing  Patient voiding in toilet with no assistance. Problem: Safety: Goal: Ability to remain free from injury will improve Outcome: Progressing

## 2022-04-06 NOTE — TOC Progression Note (Signed)
Transition of Care [facility]) - Progression Note    Patient Details  Name: Lance Green MRN: [id number] Date of Birth: 1976-04-22  [facility] CM/SW Contact  Bing Quarry, RN Phone Number: 04/06/2022, 4:32 PM  Clinical Narrative: 7/2: All SNF bed offers remain pending as of 430 pm today. Gabriel Cirri RN CM       Expected Discharge Plan: Skilled Nursing Facility Barriers to Discharge: SNF Pending bed offer  Expected Discharge Plan and Services Expected Discharge Plan: Skilled Nursing Facility   Discharge Planning Services: CM Consult Post Acute Care Choice: Skilled Nursing Facility Living arrangements for the past 2 months: Homeless                 DME Arranged: N/A DME Agency: NA       HH Arranged: NA HH Agency: NA         Social Determinants of Health (SDOH) Interventions    Readmission Risk Interventions     No data to display

## 2022-04-06 NOTE — Progress Notes (Signed)
  Progress Note   Patient: Lance Green MMH:[id number] DOB: [birth year] DOA: 03/26/2022     9 DOS: the patient was seen and examined on 04/06/2022   Brief hospital course: BRYANN MCNEALY is a 46 y.o. male who presents with chief complaint of decreased hand coordination, balance issues.  This has been going on for more than 3 years.  Cervical MRI showed cervical spine stenosis, most severe in C4-C5.  Patient had anterior cervical discectomy and fusion on C4-5, C5-6, and C3-4 on 6/28. 6/30 ED evaluated by PT/OT, recommended nursing home placement.  Patient also developed dysphagia, appears to be associated with cervical surgery, he is on dysphagia 2 diet, anticipating recovery spontaneously.     Assessment and Plan:  Severe cervical spine stenosis. Dysphagia. Patient continues to improve, anticipating going home with PT and OT when more stable. Dysphagia also improving.  Mechanical fall. Traumatic rhabdomyolysis secondary to fall. Continue PT/OT.  Condition improved  Hypokalemia Hypomagnesemia. Condition improved   Hyperglycemia.  Uncontrolled type 2 diabetes. Glucose is better today, continue current regimen.    Alcohol abuse. No evidence of withdrawal at this time      Subjective:  Still has dysphagia, but feels much better.  More steady on his feet  Physical Exam: Vitals:   04/05/22 1622 04/05/22 2010 04/06/22 0644 04/06/22 0856  BP: 126/75 130/85 123/87 139/82  Pulse: (!) 101 (!) 103 95 93  Resp: 18 19 18 18   Temp: 99.4 F (37.4 C) 98.6 F (37 C) 98.1 F (36.7 C) (!) 97.5 F (36.4 C)  TempSrc:      SpO2: 100% 99% 98% 100%  Weight:      Height:       General exam: Appears calm and comfortable  Respiratory system: Clear to auscultation. Respiratory effort normal. Cardiovascular system: S1 & S2 heard, RRR. No JVD, murmurs, rubs, gallops or clicks. No pedal edema. Gastrointestinal system: Abdomen is nondistended, soft and nontender. No organomegaly or masses  felt. Normal bowel sounds heard. Central nervous system: Alert and oriented. No focal neurological deficits. Extremities: Symmetric 5 x 5 power. Skin: No rashes, lesions or ulcers Psychiatry: Judgement and insight appear normal. Mood & affect appropriate.   Data Reviewed:  There are no new results to review at this time.  Family Communication: Fianc at bedside  Disposition: Status is: Inpatient Remains inpatient appropriate because: Unsafe discharge  Planned Discharge Destination: Home with Home Health    Time spent: 25 minutes  Author: , MD 04/06/2022 1:32 PM  For on call review [email or web address].

## 2022-04-07 LAB — GLUCOSE, CAPILLARY
Glucose-Capillary: 130 mg/dL — ABNORMAL HIGH (ref 70–99)
Glucose-Capillary: 153 mg/dL — ABNORMAL HIGH (ref 70–99)
Glucose-Capillary: 121 mg/dL — ABNORMAL HIGH (ref 70–99)
Glucose-Capillary: 120 mg/dL — ABNORMAL HIGH (ref 70–99)

## 2022-04-07 NOTE — Progress Notes (Signed)
Physical Therapy Treatment Patient Details Name: Lance Green MRN: [id number] DOB: 31-Jul-1976 Today's Date: 04/07/2022   History of Present Illness Pt is s/p C3-6 ACDF 04/02/22 after presenting with progressive cervial myelopathy. C collar when OOB. PMH significant for ETOH, pancreatitis    PT Comments    OOB and completes large lap on unit and stair training.  Gait remains generally unsteady but he is able to self recover all imbalances.  Education and reminders needed to wear collar for mobility.  He is offered RW and [facility] but denies both stating he feels they hold him back and he is better off grabbing objects for imbalances.  Pt is at an increased risk of falls and educated on safety.   Recommendations for follow up therapy are one component of a multi-disciplinary discharge planning process, led by the attending physician.  Recommendations may be updated based on patient status, additional functional criteria and insurance authorization.  Follow Up Recommendations  Follow physician's recommendations for discharge plan and follow up therapies     Assistance Recommended at Discharge Frequent or constant Supervision/Assistance  Patient can return home with the following A lot of help with bathing/dressing/bathroom;Assistance with feeding;Help with stairs or ramp for entrance;Assist for transportation;Assistance with cooking/housework;A little help with walking and/or transfers   Equipment Recommendations       Recommendations for Other Services       Precautions / Restrictions Precautions Precautions: Fall Required Braces or Orthoses: Cervical Brace Cervical Brace: Hard collar;Other (comment) Restrictions Weight Bearing Restrictions: No     Mobility  Bed Mobility Overal bed mobility: Modified Independent                  Transfers Overall transfer level: Modified independent Equipment used: None Transfers: Sit to/from Stand Sit to Stand: Modified independent  (Device/Increase time), Supervision                Ambulation/Gait Ambulation/Gait assistance: Min guard, Supervision Gait Distance (Feet): 300 Feet Assistive device: None Gait Pattern/deviations: Step-through pattern, Narrow base of support, Staggering right, Staggering left           Stairs Stairs: Yes Stairs assistance: Supervision Stair Management: Two rails, Forwards Number of Stairs: 4 General stair comments: does well with UE support   Wheelchair Mobility    Modified Rankin (Stroke Patients Only)       Balance Overall balance assessment: Needs assistance, History of Falls Sitting-balance support: Feet supported Sitting balance-Leahy Scale: Good     Standing balance support: No upper extremity supported Standing balance-Leahy Scale: Fair Standing balance comment: declines use of RW or SPC.  gait generally unsteady but no LOB.                            Cognition Arousal/Alertness: Awake/alert Behavior During Therapy: WFL for tasks assessed/performed Overall Cognitive Status: Within Functional Limits for tasks assessed                                 General Comments: Pt is very pleasant and cooperative.  does forget hard cervical collar and needs cues to remind him to wear.        Exercises      General Comments        Pertinent Vitals/Pain Pain Assessment Pain Assessment: No/denies pain    Home Living  Prior Function            PT Goals (current goals can now be found in the care plan section) Progress towards PT goals: Progressing toward goals    Frequency    Min 2X/week      PT Plan Current plan remains appropriate    Co-evaluation              AM-PAC PT "6 Clicks" Mobility   Outcome Measure  Help needed turning from your back to your side while in a flat bed without using bedrails?: None Help needed moving from lying on your back to sitting on the  side of a flat bed without using bedrails?: None Help needed moving to and from a bed to a chair (including a wheelchair)?: None Help needed standing up from a chair using your arms (e.g., wheelchair or bedside chair)?: None Help needed to walk in hospital room?: A Little Help needed climbing 3-5 steps with a railing? : A Little 6 Click Score: 22    End of Session Equipment Utilized During Treatment: Gait belt Activity Tolerance: Patient tolerated treatment well Patient left: in bed;with call bell/phone within reach;with bed alarm set Nurse Communication: Mobility status PT Visit Diagnosis: Unsteadiness on feet (R26.81);Repeated falls (R29.6);Muscle weakness (generalized) (M62.81);Other abnormalities of gait and mobility (R26.89);Difficulty in walking, not elsewhere classified (R26.2);Other symptoms and signs involving the nervous system (V78.469)     Time: 0310-0320 PT Time Calculation (min) (ACUTE ONLY): 10 min  Charges:  $Gait Training: 8-22 mins                   Danielle Dess, PTA 04/07/22, 3:38 PM

## 2022-04-07 NOTE — Progress Notes (Signed)
Speech Language Pathology Treatment:    Patient Details Name: Lance Green MRN: [id number] DOB: December 10, 1975 Today's Date: 04/07/2022 Time: 7673-4193 SLP Time Calculation (min) (ACUTE ONLY): 15 min  Assessment / Plan / Recommendation Clinical Impression  Pt seen for diet tolerance and clinical swallowing re-evaluation. Pt alert, pleasant, and cooperative. Endorses tolerance of Dysphagia 2 Diet with Nectar-thick Liquids stating, "the (thicker) liquids go down good... they help move my food along."   Per chart review, temp WNL. No recent chest imaging. Pt on room air.   Pt observed with trials of soft solid, nectar-thick liquids (via straw), and thin liquids (via straw). Oral phase was functional across trials. No overt s/sx pharyngeal noted with solids or nectar-thick liquids; however, delayed throat clearing appreciated with thin liquids which may be indicative of a pharyngeal dysphagia.   Offered pt MBSS and educated re: rationale with emphasis on concern for suspected pharyngeal dysphagia s/p ACDF. Pt politely declined stating, "the (thicker) liquids work for me. Let's see if it gets better in a few days."   Given pt's preference, continue Dysphagia 2 Diet with Nectar-thick liquids with safe swallowing strategies/aspiration precautions as outlined below.   SLP to f/u per POC for clinical swallowing re-evaluation as appropriate.   Pt and RN made aware of results, recommendations, and SLP POC. Pt verbalized understanding/agreement.    HPI HPI: Lance Green is a 46 y.o. male who presents with chief complaint of decreased hand coordination, balance issues.  This has been going on for more than 3 years.  Cervical MRI showed cervical spine stenosis, most severe in C4-C5.  Patient had anterior cervical discectomy and fusion on C4-5, C5-6, and C3-4 on 6/28.      SLP Plan  Continue with current plan of care      Recommendations for follow up therapy are one component of a  multi-disciplinary discharge planning process, led by the attending physician.  Recommendations may be updated based on patient status, additional functional criteria and insurance authorization.    Recommendations  Diet recommendations: Dysphagia 2 (fine chop);Nectar-thick liquid Medication Administration: Crushed with puree Supervision: Patient able to self feed (set up) Compensations: Minimize environmental distractions;Slow rate;Small sips/bites;Follow solids with liquid Postural Changes and/or Swallow Maneuvers: Out of bed for meals;Seated upright 90 degrees;Upright 30-60 min after meal                Oral Care Recommendations: Oral care BID (set up) Follow Up Recommendations: Skilled nursing-short term rehab (<3 hours/day) Assistance recommended at discharge: Intermittent Supervision/Assistance (for overall physical needs) SLP Visit Diagnosis: Dysphagia, pharyngeal phase (R13.13) Plan: Continue with current plan of care          [name], M.S., CCC-SLP Speech-Language Pathologist [facility] [phone] (ASCOM)  Pt  Lance Green  04/07/2022, 8:49 AM

## 2022-04-07 NOTE — Progress Notes (Signed)
Occupational Therapy Treatment Patient Details Name: Lance Green MRN: [id number] DOB: Aug 08, 1976 Today's Date: 04/07/2022   History of present illness Pt is s/p C3-6 ACDF 04/02/22 after presenting with progressive cervial myelopathy. C collar when OOB. PMH significant for ETOH, pancreatitis   OT comments  Upon entering the room, pt standing in room and is agreeable to OT intervention. He reports feeling much better and is ambulating in room without use of AD or physical assistance. Pt demonstrates toilet transfer and hand hygiene in standing without LOB or cuing for safety. Pt returning to bed at end of session. OT changing recommendation to from short term rehab because he no longer needs this level of care. Pt making excellent progress towards goals.    Recommendations for follow up therapy are one component of a multi-disciplinary discharge planning process, led by the attending physician.  Recommendations may be updated based on patient status, additional functional criteria and insurance authorization.    Follow Up Recommendations  Follow physician's recommendations for discharge plan and follow up therapies    Assistance Recommended at Discharge Intermittent Supervision/Assistance  Patient can return home with the following  Assist for transportation;Assistance with cooking/housework;A little help with walking and/or transfers;A little help with bathing/dressing/bathroom   Equipment Recommendations  None recommended by OT       Precautions / Restrictions Precautions Precautions: Fall Required Braces or Orthoses: Cervical Brace Cervical Brace: Hard collar;Other (comment) Restrictions Weight Bearing Restrictions: No       Mobility Bed Mobility Overal bed mobility: Modified Independent             General bed mobility comments: no physical assistance provided but HOB elevated    Transfers Overall transfer level: Needs assistance Equipment used: None Transfers: Sit  to/from Stand, Bed to chair/wheelchair/BSC Sit to Stand: Supervision     Step pivot transfers: Supervision           Balance Overall balance assessment: Needs assistance, History of Falls Sitting-balance support: Feet supported Sitting balance-Leahy Scale: Good     Standing balance support: During functional activity, No upper extremity supported Standing balance-Leahy Scale: Good                             ADL either performed or assessed with clinical judgement   ADL Overall ADL's : Needs assistance/impaired     Grooming: Wash/dry hands;Standing;Supervision/safety                   Toilet Transfer: Supervision/safety   Toileting- Clothing Manipulation and Hygiene: Supervision/safety       Functional mobility during ADLs: Supervision/safety      Extremity/Trunk Assessment Upper Extremity Assessment Upper Extremity Assessment: Generalized weakness;Overall [facility] for tasks assessed            Vision Patient Visual Report: No change from baseline            Cognition Arousal/Alertness: Awake/alert Behavior During Therapy: WFL for tasks assessed/performed                                   General Comments: Pt is very pleasant and cooperative                   Pertinent Vitals/ Pain       Pain Assessment Pain Assessment: No/denies pain         Frequency  Min 3X/week  Progress Toward Goals  OT Goals(current goals can now be found in the care plan section)  Progress towards OT goals: Progressing toward goals  Acute Rehab OT Goals Patient Stated Goal: to get stronger OT Goal Formulation: With patient Time For Goal Achievement: 04/17/22 Potential to Achieve Goals: Good  Plan Discharge plan needs to be updated;Frequency remains appropriate       AM-PAC OT "6 Clicks" Daily Activity     Outcome Measure   Help from another person eating meals?: None Help from another person taking care of personal  grooming?: None Help from another person toileting, which includes using toliet, bedpan, or urinal?: None Help from another person bathing (including washing, rinsing, drying)?: None Help from another person to put on and taking off regular upper body clothing?: None Help from another person to put on and taking off regular lower body clothing?: None 6 Click Score: 24    End of Session    OT Visit Diagnosis: Unsteadiness on feet (R26.81);Repeated falls (R29.6);Muscle weakness (generalized) (M62.81)   Activity Tolerance Patient tolerated treatment well   Patient Left in bed;with call bell/phone within reach   Nurse Communication          Time: 1325-1340 OT Time Calculation (min): 15 min  Charges: OT General Charges $OT Visit: 1 Visit OT Treatments $Self Care/Home Management : 8-22 mins  Jackquline Denmark, MS, OTR/L , CBIS ascom [phone]  04/07/22, 1:50 PM

## 2022-04-07 NOTE — Progress Notes (Signed)
  Progress Note   Patient: Lance Green QQI:[id number] DOB: 1975-10-20 DOA: 03/26/2022     10 DOS: the patient was seen and examined on 04/07/2022   Brief hospital course: Lance Green is a 46 y.o. male who presents with chief complaint of decreased hand coordination, balance issues.  This has been going on for more than 3 years.  Cervical MRI showed cervical spine stenosis, most severe in C4-C5.  Patient had anterior cervical discectomy and fusion on C4-5, C5-6, and C3-4 on 6/28. 6/30 ED evaluated by PT/OT, recommended nursing home placement.  Patient also developed dysphagia, appears to be associated with cervical surgery, he is on dysphagia 2 diet, anticipating recovery spontaneously.     Assessment and Plan: Severe cervical spine stenosis. Dysphagia. Dysphagia seem to be improving, however, speech therapy still recommended dysphagia 2 diet.  We will continue. Mobility seem to be better, pending PT recommendation today.  If deemed to be home with home care, patient can be discharged tomorrow.  Social worker will help with placement, patient may be homeless.  Mechanical fall. Traumatic rhabdomyolysis secondary to fall. Continue PT/OT.  Condition improved  Hypokalemia Hypomagnesemia. Condition improved   Hyperglycemia.  Uncontrolled type 2 diabetes. Glucose is better today, continue current regimen.    Alcohol abuse. No evidence of withdrawal at this time      Subjective:  Patient seems to be better with swallowing, but still has significant dysphagia. Mobility is better  Physical Exam: Vitals:   04/06/22 1629 04/06/22 2123 04/07/22 0531 04/07/22 0814  BP: (!) 139/91 (!) 145/87 124/87 (!) 147/86  Pulse: 97 92 98 85  Resp: 18 16 16 18   Temp: 98.6 F (37 C) 98.1 F (36.7 C) 98.3 F (36.8 C) 98.1 F (36.7 C)  TempSrc:  Oral    SpO2: 100% 99% 99% 100%  Weight:      Height:       General exam: Appears calm and comfortable  Respiratory system: Clear to  auscultation. Respiratory effort normal. Cardiovascular system: S1 & S2 heard, RRR. No JVD, murmurs, rubs, gallops or clicks. No pedal edema. Gastrointestinal system: Abdomen is nondistended, soft and nontender. No organomegaly or masses felt. Normal bowel sounds heard. Central nervous system: Alert and oriented. No focal neurological deficits. Extremities: Symmetric 5 x 5 power. Skin: No rashes, lesions or ulcers Psychiatry: Judgement and insight appear normal. Mood & affect appropriate. ' Data Reviewed:  There are no new results to review at this time.  Family Communication: Fianc at bedside  Disposition: Status is: Inpatient Remains inpatient appropriate because: Unsafe discharge  Planned Discharge Destination:  Pending decision    Time spent: 28 minutes  Author: , MD 04/07/2022 12:34 PM  For on call review [email or web address].

## 2022-04-08 LAB — BASIC METABOLIC PANEL
Anion gap: 7 (ref 5–15)
BUN: 9 mg/dL (ref 6–20)
CO2: 28 mmol/L (ref 22–32)
Calcium: 9.2 mg/dL (ref 8.9–10.3)
Chloride: 103 mmol/L (ref 98–111)
Creatinine, Ser: 0.76 mg/dL (ref 0.61–1.24)
GFR, Estimated: >60 mL/min (ref >60)
Glucose, Bld: 108 mg/dL — ABNORMAL HIGH (ref 70–99)
Potassium: 4.2 mmol/L (ref 3.5–5.1)
Sodium: 138 mmol/L (ref 135–145)

## 2022-04-08 LAB — GLUCOSE, CAPILLARY
Glucose-Capillary: 119 mg/dL — ABNORMAL HIGH (ref 70–99)
Glucose-Capillary: 122 mg/dL — ABNORMAL HIGH (ref 70–99)
Glucose-Capillary: 129 mg/dL — ABNORMAL HIGH (ref 70–99)
Glucose-Capillary: 117 mg/dL — ABNORMAL HIGH (ref 70–99)

## 2022-04-08 LAB — MAGNESIUM: Magnesium: 1.9 mg/dL (ref 1.7–2.4)

## 2022-04-08 NOTE — Progress Notes (Signed)
  Progress Note   Patient: Lance Green EZB:[id number] DOB: December 02, 1975 DOA: 03/26/2022     11 DOS: the patient was seen and examined on 04/08/2022   Brief hospital course: Lance Green is a 46 y.o. male who presents with chief complaint of decreased hand coordination, balance issues.  This has been going on for more than 3 years.  Cervical MRI showed cervical spine stenosis, most severe in C4-C5.  Patient had anterior cervical discectomy and fusion on C4-5, C5-6, and C3-4 on 6/28. 6/30 ED evaluated by PT/OT, recommended nursing home placement.  Patient also developed dysphagia, appears to be associated with cervical surgery, he is on dysphagia 2 diet, anticipating recovery spontaneously.     Assessment and Plan: Severe cervical spine stenosis. Dysphagia. Doing much better.  Still has some dysphagia, on dysphagia 2 diet.  Followed by speech therapy. Mobility much improved, PT/OT no longer recommend nursing home placement. Patient can discharge home tomorrow after evaluated by Dr. Myer Haff.    Mechanical fall. Traumatic rhabdomyolysis secondary to fall. Improved  Hypokalemia Hypomagnesemia. Condition improved   Hyperglycemia.  Uncontrolled type 2 diabetes. Glucose is better today, continue current regimen.    Alcohol abuse. No evidence of withdrawal at this time        Subjective:  Patient still has some dysphagia, overall improving. Ambulating much better.  Physical Exam: Vitals:   04/07/22 1744 04/07/22 2102 04/08/22 0535 04/08/22 0833  BP: (!) 151/93 (!) 151/88 (!) 146/94 (!) 136/95  Pulse: 93 93 82 (!) 102  Resp: 18 18 18 18   Temp: 98.8 F (37.1 C) 98.3 F (36.8 C) 98.4 F (36.9 C) 99.7 F (37.6 C)  TempSrc:      SpO2: 100% 100% 100% 96%  Weight:      Height:       General exam: Appears calm and comfortable  Respiratory system: Clear to auscultation. Respiratory effort normal. Cardiovascular system: S1 & S2 heard, RRR. No JVD, murmurs, rubs, gallops  or clicks. No pedal edema. Gastrointestinal system: Abdomen is nondistended, soft and nontender. No organomegaly or masses felt. Normal bowel sounds heard. Central nervous system: Alert and oriented. No focal neurological deficits. Extremities: Symmetric 5 x 5 power. Skin: No rashes, lesions or ulcers Psychiatry: Judgement and insight appear normal. Mood & affect appropriate. ' Data Reviewed:  There are no new results to review at this time.  Family Communication: Fianc at the bedside.  Disposition: Status is: Inpatient Remains inpatient appropriate because: Severity of disease.  Planned Discharge Destination: Home    Time spent: 35 minutes  Author: , MD 04/08/2022 12:24 PM  For on call review [email or web address].

## 2022-04-08 NOTE — Progress Notes (Signed)
Speech Language Pathology Treatment: Dysphagia  Patient Details Name: Lance Green MRN: [id number] DOB: [birth year] Today's Date: 04/08/2022 Time: 1310-1350 SLP Time Calculation (min) (ACUTE ONLY): 40 min  Assessment / Plan / Recommendation Clinical Impression  Pt seen for diet tolerance, and education on diet consistency(Nectar liquids) w/ preparation of Nectar liquids and ordering of Nectar liquids. Pt alert, pleasant, and cooperative. Fiancee present in room.  Pt endorses tolerance of Dysphagia 2 Diet with Nectar-thick Liquids stating, "the (thicker) liquids go down good... they help move my food along.". He stated preference for remaining on the Nectar liquids at this time and for D/C home. Noted a mildly wet vocal quality and throat clearing intermittently w/ saliva (appeared) PRIOR TO any po's given. Per chart review, no recent chest imaging. Pt on room air, afrebrile.   Pt observed with trials of Nectar liquids via Cup w/ no immediate, overt s/s of aspiration or pharyngeal phase deficits noted w/ trials. However, pt exhibited a delayed, mild throat clearing x2 which may be impact from the swallowing. Similar presentation as PRIOR TO po's given. Pt declined trials of thin liquids but has been taking ice chips per pt/NSG -- encouraged pt to continue this for pleasure, swallowing ex., and hydration. Instructed pt on using thorough oral care Prior to any ice chips to reduce oral bacteria in setting of increased risk for aspiration. Pt agreed.  Pt has been offered pt MBSS and educated re: rationale with emphasis on concern for suspected pharyngeal dysphagia s/p ACDF. When asked about this again today, pt declined stating, "not right now. I am doing fine w/ the (thicker) liquids.". Encouraged him to f/u w/ his PCP for ongoing monitoring of pulmonary status(for aspiration) and swallowing -- a referral for an Outpt MBSS can be obtained. Pt agreed.    Per pt's preference, recommend continue Dysphagia  2 diet w/ Nectar consistency liquids w/ general aspiration precautions; safe swallowing strategies including f/u, Dry swallow and throat clear/cough as needed/at end of meals. Recommend Pills in Puree for ease and safety of swallowing. Frequent oral care prior to/after po's.    Pt and NSG made aware of results, recommendations. Pt verbalized understanding/agreement. Supplies (Nectar liquids) and ordering information provided for D/C; handouts given on precautions/strategies to pt/fiancee.           HPI HPI: Lance Green is a 46 y.o. male who presents with chief complaint of decreased hand coordination, balance issues; also swallowing issues.  This has been going on for more than 3 years.  Cervical MRI showed cervical spine stenosis, most severe in C4-C5.  Patient had anterior cervical discectomy and fusion on C4-5, C5-6, and C3-4 on 6/28.      SLP Plan  All goals met (education completed)      Recommendations for follow up therapy are one component of a multi-disciplinary discharge planning process, led by the attending physician.  Recommendations may be updated based on patient status, additional functional criteria and insurance authorization.    Recommendations  Diet recommendations: Dysphagia 2 (fine chop);Nectar-thick liquid (soft solids cut small; moistened foods) Liquids provided via: Cup Medication Administration: Crushed with puree (for ease of swallowing) Supervision: Patient able to self feed Compensations: Minimize environmental distractions;Slow rate;Small sips/bites;Multiple dry swallows after each bite/sip;Follow solids with liquid Postural Changes and/or Swallow Maneuvers: Out of bed for meals;Seated upright 90 degrees;Upright 30-60 min after meal                General recommendations:  (Dietician f/u) Oral Care Recommendations:  Oral care BID;Oral care before and after PO;Patient independent with oral care (support) Follow Up Recommendations: Outpatient SLP  (TBD vs Home Health) Assistance recommended at discharge: PRN SLP Visit Diagnosis: Dysphagia, pharyngeal phase (R13.13) Plan: All goals met (education completed)              Orinda Kenner, [place], [place]; [place] [phone] (ascom) Dexton Zwilling  04/08/2022, 4:30 PM

## 2022-04-08 NOTE — Progress Notes (Signed)
    Attending Progress Note  History: Lance Green is here for cervical myelopathy  POD5: Soft tissue swelling at incision is stable and non tender. Appears subcutaneous, trachea is midline POD4: Soft swelling noted at the incision, no changes in color and no pain. No Drainage POD3: Steady improvements.  POD2: Continues to improve. He is having expected dysphagia, but can swallow and talk.   POD1: He is doing much better.  Hand movement and strength are improved compared to pre-op. POD0: [name] is in PACU. He was initially having trouble moving his arms, but is now able to move.  HD6: No new events.  HD4: Neurosurgery consulted for myelopathy  HPI: Lance Green is a 46 y.o. male who presents with the chief complaint of Decreased hand coordination, balance issues.  This has been going on for more than 3 years.  No trauma.  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  Also problems with balance but no recent falls.  No fevers, chills.  Physical Exam: Vitals:   04/08/22 1633 04/08/22 2100  BP: (!) 145/88 (!) 142/91  Pulse: 97 95  Resp: 19 20  Temp: 98.6 F (37 C) 97.8 F (36.6 C)  SpO2: 99% 100%    AA Ox3 CNI  Strength:5/5 BUE deltoids, biceps, triceps. 4+/5 interossei, 4+grip, wrist extension. Moves lower extremities well 5/5 Sensation diminished in hands and arms  Neck incision c/d/I. Trachea midline. Soft swelling noted, non tender, non draining, no redness.   Data:  Recent Labs  Lab 04/03/22 0317 04/05/22 0652 04/08/22 0327  NA 135 137 138  K 4.3 4.3 4.2  CL 102 102 103  CO2 22 25 28   BUN 10 8 9   CREATININE 0.79 0.81 0.76  GLUCOSE 291* 128* 108*  CALCIUM 9.1 9.9 9.2   No results for input(s): "AST", "ALT", "ALKPHOS" in the last 168 hours.  Invalid input(s): "TBILI"    Recent Labs  Lab 04/03/22 0317  WBC 12.3*  HGB 12.5*  HCT 36.3*  PLT 285   No results for input(s): "APTT", "INR" in the last 168  hours.        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 He underwent C3-6 ACDF.  His strength has improved. Swallowing slowly improving daily. He's been up and walking around the unit. Has been outside briefely.   - Continue to monitor neck incision, if worsens or he complains of discomfort plan for CT - C Collar when OOB - pain control with orals only - DVT prophylaxis - PTOT  03/30/22, MD

## 2022-04-09 LAB — GLUCOSE, CAPILLARY: Glucose-Capillary: 202 mg/dL — ABNORMAL HIGH (ref 70–99)

## 2022-04-09 NOTE — Discharge Summary (Addendum)
Physician Discharge Summary   Lance Green  male DOB: [birth year]  EHU:[id number]  PCP: Pcp, No  Admit date: 03/26/2022 Discharge date: 04/09/2022  Admitted From: home Disposition:  home Home Health: Yes CODE STATUS: Full code  Discharge Instructions     No wound care   Complete by: As directed       Hospital Course:  For full details, please see H&P, progress notes, consult notes and ancillary notes.  Briefly,  Lance Green is a 46 y.o. male who presented with chief complaint of decreased hand coordination, balance issues.  This has been going on for more than 3 years.  Cervical MRI showed cervical spine stenosis, most severe in C4-C5.  Patient had anterior cervical discectomy and fusion on C4-5, C5-6, and C3-4 on 6/28.  Patient also developed dysphagia, appears to be associated with cervical surgery.   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 Myer Haff Doing much better.  Mobility much improved, PT/OT no longer recommend nursing home placement. Patient was cleared to discharge home by Dr. Myer Haff.  --outpatient f/u with Dr. Myer Haff.   Dysphagia. Doing well on dysphagia 2 diet, and Nectar-thick liquid.  Anticipating recovery.  Mechanical fall. PT/OT during hospitalization and after discharge with HH.  Traumatic rhabdomyolysis secondary to fall. --CK 2261 on presentation, down to 189 by 6/25.  Hypokalemia Hypomagnesemia. --monitored and repleted PRN   Hyperglycemia DM2 ruled out --A1c 5.4, no hx of DM2.  Hyperglycemia may be due to stress response.  Pt received insulin during hospitalization but was not discharged on hypoglycemics.   Alcohol abuse. No evidence of withdrawal at this time.  Pt was not taking folic acid or thiamine PTA.  Normocytic anemia --Hgb stable 11's-12's.  Anemia mild, no bleeding.  Further workup was not performed to  determine the type of anemia.  Unless noted above, medications under "STOP" list are ones pt was not taking PTA.  Discharge Diagnoses:  Principal Problem:   Cervical cord compression with myelopathy (HCC) Active Problems:   Alcohol abuse   Hypomagnesemia   Normocytic anemia   HTN (hypertension)   Hypokalemia   Rhabdomyolysis   Fall at home, initial encounter   Dysphagia   30 Day Unplanned Readmission Risk Score    Flowsheet Row ED to Hosp-Admission (Current) from 03/26/2022 in [facility] 1C MEDICAL TELEMETRY  30 Day Unplanned Readmission Risk Score (%) 12.44 Filed at 04/09/2022 0801       This score is the patient's risk of an unplanned readmission within 30 days of being discharged (0 -100%). The score is based on dignosis, age, lab data, medications, orders, and past utilization.   Low:  0-14.9   Medium: 15-21.9   High: 22-29.9   Extreme: 30 and above         Discharge Instructions:  Allergies as of 04/09/2022   No Known Allergies      Medication List     STOP taking these medications    amLODipine 2.5 MG tablet Commonly known as: NORVASC   FERROUS SULFATE PO   folic acid 1 MG tablet Commonly known as: FOLVITE   hydrOXYzine 25 MG tablet Commonly known as: ATARAX   levETIRAcetam 250 MG tablet Commonly known as: Keppra   multivitamin with minerals Tabs tablet   thiamine 100 MG tablet   triamcinolone ointment 0.5 % Commonly known as: KENALOG         Follow-up Information  [facility] In 1 week.   Contact information: [address] [place] [address] [phone]         Venetia Night, MD Follow up.   Specialty: Neurosurgery Contact information: [address] [place] [address] [phone]                 No Known Allergies   The results of significant diagnostics from this hospitalization (including imaging, microbiology, ancillary and laboratory) are  listed below for reference.   Consultations:   Procedures/Studies: DG Cervical Spine 2-3 Views  Result Date: 04/02/2022 CLINICAL DATA:  Fluoroscopic assistance for cervical fusion. EXAM: CERVICAL SPINE - 2-3 VIEW COMPARISON:  04/05/2019 FINDINGS: Fluoroscopic images show anterior cervical disc fusion from C3-C6 levels. Fluoroscopic time 2 seconds. Radiation dose 0.085 mGy. IMPRESSION: Fluoroscopic assistance was provided for anterior cervical disc fusion. Electronically Signed   By: Ernie Avena M.D.   On: 04/02/2022 18:37   DG C-Arm 1-60 Min-No Report  Result Date: 04/02/2022 Fluoroscopy was utilized by the requesting physician.  No radiographic interpretation.   DG C-Arm 1-60 Min-No Report  Result Date: 04/02/2022 Fluoroscopy was utilized by the requesting physician.  No radiographic interpretation.   DG C-Arm 1-60 Min-No Report  Result Date: 04/02/2022 Fluoroscopy was utilized by the requesting physician.  No radiographic interpretation.   MR Brain W and Wo Contrast  Result Date: 03/28/2022 CLINICAL DATA:  Transient ischemic attack (TIA). EXAM: MRI HEAD WITHOUT AND WITH CONTRAST TECHNIQUE: Multiplanar, multiecho pulse sequences of the brain and surrounding structures were obtained without and with intravenous contrast. CONTRAST:  76mL GADAVIST GADOBUTROL 1 MMOL/ML IV SOLN COMPARISON:  Head CT March 26, 2022. FINDINGS: Brain: No acute infarction, hemorrhage, hydrocephalus, extra-axial collection or mass lesion. No focus of abnormal contrast enhancement identified. Vascular: Normal flow voids. Skull and upper cervical spine: Normal marrow signal. Sinuses/Orbits: Mucosal thickening throughout the paranasal sinuses, more pronounced in the bilateral maxillary sinuses where there is fluid level bilateral. Mild mucosal thickening of the bilateral mastoid cells. The orbits are maintained. Other: None. IMPRESSION: 1. No acute intracranial abnormality. 2. Unremarkable MRI of the brain.  Electronically Signed   By: Baldemar Lenis M.D.   On: 03/28/2022 15:40   MR CERVICAL SPINE W WO CONTRAST  Result Date: 03/28/2022 CLINICAL DATA:  Nontraumatic ataxia. Three year history of worsening gait disturbance, hand weakness and leg numbness. EXAM: MRI CERVICAL AND THORACIC SPINE WITHOUT CONTRAST TECHNIQUE: Multiplanar and multiecho pulse sequences of the cervical spine, to include the craniocervical junction and cervicothoracic junction, and the thoracic spine, were obtained without intravenous contrast. COMPARISON:  Neck CT 03/26/2022 FINDINGS: MRI CERVICAL SPINE FINDINGS Alignment: Normal Vertebrae: No bone lesions or fractures. Cord: Significant cord compression at C4-5 with associated cord edema. No cord lesions. Posterior Fossa, vertebral arteries, paraspinal tissues: No significant findings. Disc levels: C2-3: No significant findings. C3-4: Moderate-sized broad-based disc protrusion with left paracentral focality. Significant mass effect on the thecal sac with flattening and slight deforming of the anterior cervical cord. Mild foraminal stenosis bilaterally also. C4-5: Large central and right paracentral disc protrusion with significant mass effect on the cervical spinal cord which is markedly flattened and demonstrates increased T2 signal intensity which could be edema or myelomalacia. Mild foraminal stenosis bilaterally. C5-6: Moderate-sized central and left paracentral disc protrusion with mass effect on the thecal sac and flattening of the cervical spinal cord. Moderate bilateral foraminal stenosis also. C6-7: Diffuse bulging degenerated annulus and small right paracentral annular rent. There  is flattening of the ventral thecal sac and narrowing the ventral CSF space. No significant foraminal stenosis. C7-T1: Mild central disc bulge but no significant spinal or foraminal stenosis. MRI THORACIC SPINE FINDINGS Alignment:  Normal Vertebrae: Normal marrow signal.  No bone lesions or  fractures. Cord:  Normal cord signal intensity.  No cord lesions or syrinx. Paraspinal and other soft tissues: No significant paraspinal findings. Disc levels: No thoracic disc protrusions, spinal or foraminal stenosis. IMPRESSION: 1. Significant multilevel disc protrusions in the cervical spine. Significant canal stenosis and cord impingement most severe at C4-5. Associated abnormal T2 signal intensity in the cord at this level could be edema or myelomalacia. 2. Mild to moderate bilateral foraminal stenosis at C3-4, C4-5 and C5-6. 3. Unremarkable thoracic spine MRI examination. Electronically Signed   By: Rudie Meyer M.D.   On: 03/28/2022 15:38   MR THORACIC SPINE W WO CONTRAST  Result Date: 03/28/2022 CLINICAL DATA:  Nontraumatic ataxia. Three year history of worsening gait disturbance, hand weakness and leg numbness. EXAM: MRI CERVICAL AND THORACIC SPINE WITHOUT CONTRAST TECHNIQUE: Multiplanar and multiecho pulse sequences of the cervical spine, to include the craniocervical junction and cervicothoracic junction, and the thoracic spine, were obtained without intravenous contrast. COMPARISON:  Neck CT 03/26/2022 FINDINGS: MRI CERVICAL SPINE FINDINGS Alignment: Normal Vertebrae: No bone lesions or fractures. Cord: Significant cord compression at C4-5 with associated cord edema. No cord lesions. Posterior Fossa, vertebral arteries, paraspinal tissues: No significant findings. Disc levels: C2-3: No significant findings. C3-4: Moderate-sized broad-based disc protrusion with left paracentral focality. Significant mass effect on the thecal sac with flattening and slight deforming of the anterior cervical cord. Mild foraminal stenosis bilaterally also. C4-5: Large central and right paracentral disc protrusion with significant mass effect on the cervical spinal cord which is markedly flattened and demonstrates increased T2 signal intensity which could be edema or myelomalacia. Mild foraminal stenosis bilaterally.  C5-6: Moderate-sized central and left paracentral disc protrusion with mass effect on the thecal sac and flattening of the cervical spinal cord. Moderate bilateral foraminal stenosis also. C6-7: Diffuse bulging degenerated annulus and small right paracentral annular rent. There is flattening of the ventral thecal sac and narrowing the ventral CSF space. No significant foraminal stenosis. C7-T1: Mild central disc bulge but no significant spinal or foraminal stenosis. MRI THORACIC SPINE FINDINGS Alignment:  Normal Vertebrae: Normal marrow signal.  No bone lesions or fractures. Cord:  Normal cord signal intensity.  No cord lesions or syrinx. Paraspinal and other soft tissues: No significant paraspinal findings. Disc levels: No thoracic disc protrusions, spinal or foraminal stenosis. IMPRESSION: 1. Significant multilevel disc protrusions in the cervical spine. Significant canal stenosis and cord impingement most severe at C4-5. Associated abnormal T2 signal intensity in the cord at this level could be edema or myelomalacia. 2. Mild to moderate bilateral foraminal stenosis at C3-4, C4-5 and C5-6. 3. Unremarkable thoracic spine MRI examination. Electronically Signed   By: Rudie Meyer M.D.   On: 03/28/2022 15:38   CT HEAD WO CONTRAST ( )  Result Date: 03/26/2022 CLINICAL DATA:  Trauma. EXAM: CT HEAD WITHOUT CONTRAST CT CERVICAL SPINE WITHOUT CONTRAST TECHNIQUE: Multidetector CT imaging of the head and cervical spine was performed following the standard protocol without intravenous contrast. Multiplanar CT image reconstructions of the cervical spine were also generated. RADIATION DOSE REDUCTION: This exam was performed according to the departmental dose-optimization program which includes automated exposure control, adjustment of the mA and/or kV according to patient size and/or use of iterative reconstruction technique. COMPARISON:  Head CT dated 02/23/2022. FINDINGS: CT HEAD FINDINGS Brain: The ventricles and  sulci are appropriate size for the patient's age. The gray-white matter discrimination is preserved. There is no acute intracranial hemorrhage. No mass effect or midline shift. No extra-axial fluid collection. Vascular: No hyperdense vessel or unexpected calcification. Skull: Normal. Negative for fracture or focal lesion. Sinuses/Orbits: Mild mucoperiosteal thickening of paranasal sinuses. No air-fluid. The mastoid air cells are clear. Other: None CT CERVICAL SPINE FINDINGS Alignment: No acute subluxation. There is straightening of normal cervical lordosis which may be positional or due to muscle spasm. Skull base and vertebrae: No acute fracture. Soft tissues and spinal canal: No prevertebral fluid or swelling. No visible canal hematoma. Disc levels: Degenerative changes at C5-C6 with disc space narrowing, endplate irregularity, and spurring. Upper chest: Negative. Other: None IMPRESSION: 1. No acute intracranial pathology. 2. No acute cervical spine fracture or subluxation. Electronically Signed   By: Elgie CollardArash  Radparvar M.D.   On: 03/26/2022 22:37   CT CERVICAL SPINE WO CONTRAST  Result Date: 03/26/2022 CLINICAL DATA:  Trauma. EXAM: CT HEAD WITHOUT CONTRAST CT CERVICAL SPINE WITHOUT CONTRAST TECHNIQUE: Multidetector CT imaging of the head and cervical spine was performed following the standard protocol without intravenous contrast. Multiplanar CT image reconstructions of the cervical spine were also generated. RADIATION DOSE REDUCTION: This exam was performed according to the departmental dose-optimization program which includes automated exposure control, adjustment of the mA and/or kV according to patient size and/or use of iterative reconstruction technique. COMPARISON:  Head CT dated 02/23/2022. FINDINGS: CT HEAD FINDINGS Brain: The ventricles and sulci are appropriate size for the patient's age. The gray-white matter discrimination is preserved. There is no acute intracranial hemorrhage. No mass effect or  midline shift. No extra-axial fluid collection. Vascular: No hyperdense vessel or unexpected calcification. Skull: Normal. Negative for fracture or focal lesion. Sinuses/Orbits: Mild mucoperiosteal thickening of paranasal sinuses. No air-fluid. The mastoid air cells are clear. Other: None CT CERVICAL SPINE FINDINGS Alignment: No acute subluxation. There is straightening of normal cervical lordosis which may be positional or due to muscle spasm. Skull base and vertebrae: No acute fracture. Soft tissues and spinal canal: No prevertebral fluid or swelling. No visible canal hematoma. Disc levels: Degenerative changes at C5-C6 with disc space narrowing, endplate irregularity, and spurring. Upper chest: Negative. Other: None IMPRESSION: 1. No acute intracranial pathology. 2. No acute cervical spine fracture or subluxation. Electronically Signed   By: Elgie CollardArash  Radparvar M.D.   On: 03/26/2022 22:37   DG Cervical Spine Complete  Result Date: 03/25/2022 CLINICAL DATA:  Neck pain after a fall yesterday. EXAM: CERVICAL SPINE - COMPLETE 4+ VIEW COMPARISON:  CT cervical spine 03/31/2021 FINDINGS: Straightening of usual cervical lordosis without anterior subluxation. This is unchanged since prior study. This is likely positional but could indicate muscle spasm. No vertebral compression deformities. Prominent degenerative changes with disc space narrowing and endplate osteophyte formation at C5-6 without change. Normal alignment of the posterior elements. No bone encroachment upon the neural foramina. C1-2 articulation appears intact. IMPRESSION: Nonspecific straightening of usual cervical lordosis without change. Prominent degenerative changes at C5-6 also without change. No acute displaced fractures identified. Electronically Signed   By: Burman NievesWilliam  Stevens M.D.   On: 03/25/2022 00:48   DG Lumbar Spine Complete  Result Date: 03/25/2022 CLINICAL DATA:  Low back pain after a fall. Fell yesterday and 3 days ago. EXAM: LUMBAR  SPINE - COMPLETE 4+ VIEW COMPARISON:  CT abdomen and pelvis 07/06/2018 FINDINGS: Sacralization of L5. Normal alignment of  the lumbar vertebrae. No vertebral compression deformities. No focal bone lesion or bone destruction. Bone cortex appears intact. Visualized sacrum appears intact. IMPRESSION: Normal alignment.  No acute displaced fractures. Electronically Signed   By: Burman Nieves M.D.   On: 03/25/2022 00:46      Labs: BNP (last 3 results) No results for input(s): "BNP" in the last 8760 hours. Basic Metabolic Panel: Recent Labs  Lab 04/03/22 0317 04/04/22 0413 04/05/22 0652 04/08/22 0327  NA 135  --  137 138  K 4.3  --  4.3 4.2  CL 102  --  102 103  CO2 22  --  25 28  GLUCOSE 291*  --  128* 108*  BUN 10  --  8 9  CREATININE 0.79  --  0.81 0.76  CALCIUM 9.1  --  9.9 9.2  MG 1.5* 1.6* 1.9 1.9   Liver Function Tests: No results for input(s): "AST", "ALT", "ALKPHOS", "BILITOT", "PROT", "ALBUMIN" in the last 168 hours. No results for input(s): "LIPASE", "AMYLASE" in the last 168 hours. No results for input(s): "AMMONIA" in the last 168 hours. CBC: Recent Labs  Lab 04/03/22 0317  WBC 12.3*  HGB 12.5*  HCT 36.3*  MCV 93.6  PLT 285   Cardiac Enzymes: No results for input(s): "CKTOTAL", "CKMB", "CKMBINDEX", "TROPONINI" in the last 168 hours. BNP: Invalid input(s): "POCBNP" CBG: Recent Labs  Lab 04/08/22 0832 04/08/22 1202 04/08/22 1631 04/08/22 2028 04/09/22 0814  GLUCAP 119* 122* 129* 117* 202*   D-Dimer No results for input(s): "DDIMER" in the last 72 hours. Hgb A1c No results for input(s): "HGBA1C" in the last 72 hours. Lipid Profile No results for input(s): "CHOL", "HDL", "LDLCALC", "TRIG", "CHOLHDL", "LDLDIRECT" in the last 72 hours. Thyroid function studies No results for input(s): "TSH", "T4TOTAL", "T3FREE", "THYROIDAB" in the last 72 hours.  Invalid input(s): "FREET3" Anemia work up No results for input(s): "VITAMINB12", "FOLATE", "FERRITIN",  "TIBC", "IRON", "RETICCTPCT" in the last 72 hours. Urinalysis    Component Value Date/Time   COLORURINE YELLOW (A) 03/25/2022 0126   APPEARANCEUR CLEAR (A) 03/25/2022 0126   APPEARANCEUR Hazy 01/18/2014 1701   LABSPEC 1.009 03/25/2022 0126   LABSPEC 1.013 01/18/2014 1701   PHURINE 5.0 03/25/2022 0126   GLUCOSEU NEGATIVE 03/25/2022 0126   GLUCOSEU Negative 01/18/2014 1701   HGBUR SMALL (A) 03/25/2022 0126   BILIRUBINUR NEGATIVE 03/25/2022 0126   BILIRUBINUR Negative 01/18/2014 1701   KETONESUR NEGATIVE 03/25/2022 0126   PROTEINUR 30 (A) 03/25/2022 0126   NITRITE NEGATIVE 03/25/2022 0126   LEUKOCYTESUR NEGATIVE 03/25/2022 0126   LEUKOCYTESUR Negative 01/18/2014 1701   Sepsis Labs Recent Labs  Lab 04/03/22 0317  WBC 12.3*   Microbiology No results found for this or any previous visit (from the past 240 hour(s)).   Total time spend on discharging this patient, including the last patient exam, discussing the hospital stay, instructions for ongoing care as it relates to all pertinent caregivers, as well as preparing the medical discharge records, prescriptions, and/or referrals as applicable, is 45 minutes.    Darlin Priestly, MD  [facility] 04/09/2022, 9:17 AM

## 2022-04-09 NOTE — Progress Notes (Signed)
    Attending Progress Note  History: Lance Green is here for cervical myelopathy  POD6: Pt tolerated normal breakfast this morning.  POD5: Soft tissue swelling at incision is stable and non tender. Appears subcutaneous, trachea is midline POD4: Soft swelling noted at the incision, no changes in color and no pain. No Drainage POD3: Steady improvements.  POD2: Continues to improve. He is having expected dysphagia, but can swallow and talk.   POD1: He is doing much better.  Hand movement and strength are improved compared to pre-op. POD0: Lance Green is in PACU. He was initially having trouble moving his arms, but is now able to move.  HD6: No new events.  HD4: Neurosurgery consulted for myelopathy  HPI: Lance Green is a 46 y.o. male who presents with the chief complaint of Decreased hand coordination, balance issues.  This has been going on for more than 3 years.  No trauma.  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  Also problems with balance but no recent falls.  No fevers, chills.  Physical Exam: Vitals:   04/09/22 0605 04/09/22 0736  BP: (!) 131/99 126/82  Pulse: 95 88  Resp: 16 15  Temp: 97.6 F (36.4 C) 97.7 F (36.5 C)  SpO2: 96% 100%    AA Ox3 CNI  Strength:5/5 BUE deltoids, biceps, triceps. 4+/5 interossei, 4+grip, wrist extension. 5/5 BLE Decreases sensation to light touch in hands and arms  Neck incision c/d/I. Trachea midline.  Data:  Recent Labs  Lab 04/03/22 0317 04/05/22 0652 04/08/22 0327  NA 135 137 138  K 4.3 4.3 4.2  CL 102 102 103  CO2 22 25 28   BUN 10 8 9   CREATININE 0.79 0.81 0.76  GLUCOSE 291* 128* 108*  CALCIUM 9.1 9.9 9.2    No results for input(s): "AST", "ALT", "ALKPHOS" in the last 168 hours.  Invalid input(s): "TBILI"    Recent Labs  Lab 04/03/22 0317  WBC 12.3*  HGB 12.5*  HCT 36.3*  PLT 285    No results for input(s): "APTT", "INR" in the last 168 hours.        Other  tests/results: 6/23/23MRI C spine shows severe stenosis C3-6  Assessment/Plan:  Lance Green is here for progressive cervical myelopathy. He underwent C3-6 ACDF.  His strength has improved. Swallowing slowly improving daily. He's been up and walking around the unit. Has been outside briefely.   - C Collar when OOB - pain control with orals only - DVT prophylaxis - PTOT  03/30/22 PA-C Neurosurgery

## 2022-04-09 NOTE — TOC Transition Note (Signed)
Transition of Care [facility]) - CM/SW Discharge Note   Patient Details  Name: KAESYN JOHNSTON MRN: [id number] Date of Birth: Nov 29, 1975  Transition of Care [facility]) CM/SW Contact:  Chapman Fitch, RN Phone Number: 04/09/2022, 10:04 AM   Clinical Narrative:    Patient to discharge today Fiance at bedside Patient states that he will be going to his fiance's home at discharge.  He did not provide me with the address, but confirmed it was in [place]  Patient declined home health services No new medications at discharge Inquired if patient needed assistance with transport at discharge.  Patient declined and stated that him and his fiance would be taking the bus at discharge, as this is what they are familiar with.   [facility]  application printed to unit and bedside Rn to provide to patient at discharge      Barriers to Discharge: SNF Pending bed offer   Patient Goals and CMS Choice Patient states their goals for this hospitalization and ongoing recovery are:: patient wants to get better and be able to take care of himself CMS Medicare.gov Compare Post Acute Care list provided to:: Patient Choice offered to / list presented to : Patient  Discharge Placement                       Discharge Plan and Services   Discharge Planning Services: CM Consult Post Acute Care Choice: Skilled Nursing Facility          DME Arranged: N/A DME Agency: NA       HH Arranged: NA HH Agency: NA        Social Determinants of Health (SDOH) Interventions     Readmission Risk Interventions     No data to display

## 2022-04-14 ENCOUNTER — Encounter: Payer: Self-pay | Admitting: Neurosurgery

## 2022-04-17 ENCOUNTER — Encounter: Payer: Self-pay | Admitting: Neurosurgery

## 2022-05-14 ENCOUNTER — Encounter: Payer: Self-pay | Admitting: *Deleted

## 2022-05-14 ENCOUNTER — Other Ambulatory Visit: Payer: Self-pay

## 2022-05-14 DIAGNOSIS — Z981 Arthrodesis status: Secondary | ICD-10-CM

## 2022-05-15 ENCOUNTER — Encounter: Payer: Self-pay | Admitting: Neurosurgery

## 2022-07-24 NOTE — Congregational Nurse Program (Signed)
  Dept: [phone]   Congregational Nurse Program Note  Date of Encounter: 07/24/2022 Client to [facility] clinic today with request for medication for his eczema. RN explained that there was no medication available for that condition at this clinic. RN discussed triggers of eczema, client reports that he has changed the soap he uses and the detergent. He is currently trying cortisone cream with out success. Client unclear as to how long this condition has been bothering him. He reports having had "back surgery" last month, per chart note review it was in June of this year. RN offered to assist client with the application for the [facility]. He declined at this visit, but did state he would return to clinic next week for assistance. Support given. No other needs at this time. Past Medical History: Past Medical History:  Diagnosis Date   Alcohol abuse    HTN (hypertension)    Neuropathy    Pancreatitis    Peptic ulcer disease     Encounter Details:  CNP Questionnaire - 07/24/22 1624       Questionnaire   Ask client: Do you give verbal consent for me to treat you today? Yes    Student Assistance N/A    Location Patient [place]    Visit Setting with Client Organization    Patient Status Unknown   client reports he is not homeless   Insurance Uninsured ([place] Card/Care Connects/Self-Pay/Medicaid Family Planning)    Insurance/Financial Assistance Referral N/A    Medication N/A   unclear at this time   Medical Provider No    Screening Referrals Made N/A    Medical Referrals Made N/A   Discussed application for [facility], client did not want to stay long enough to complete, but stated he would RTC next week   Medical Appointment Made N/A    Recently w/o PCP, now 1st time PCP visit completed due to CNs referral or appointment made N/A    Food N/A    Transportation N/A   Uses the [employer] system   Housing/Utilities N/A   clientt mentioned living with his mother    Interpersonal Safety N/A    Interventions Advocate/Support    Abnormal to Normal Screening Since Last CN Visit N/A    Screenings CN Performed N/A    Sent Client to Lab for: N/A    Did client attend any of the following based off CNs referral or appointments made? N/A    ED Visit Averted N/A    Life-Saving Intervention Made N/A

## 2022-10-10 ENCOUNTER — Emergency Department: Payer: 59

## 2022-10-10 ENCOUNTER — Emergency Department
Admission: EM | Admit: 2022-10-10 | Discharge: 2022-10-10 | Disposition: A | Discharge status: Home / Self Care | Payer: 59 | Attending: Emergency Medicine | Admitting: Emergency Medicine

## 2022-10-10 ENCOUNTER — Other Ambulatory Visit: Payer: Self-pay

## 2022-10-10 DIAGNOSIS — Z1152 Encounter for screening for COVID-19: Secondary | ICD-10-CM | POA: Insufficient documentation

## 2022-10-10 DIAGNOSIS — J189 Pneumonia, unspecified organism: Secondary | ICD-10-CM | POA: Diagnosis not present

## 2022-10-10 DIAGNOSIS — R059 Cough, unspecified: Secondary | ICD-10-CM | POA: Diagnosis not present

## 2022-10-10 DIAGNOSIS — J101 Influenza due to other identified influenza virus with other respiratory manifestations: Secondary | ICD-10-CM | POA: Diagnosis not present

## 2022-10-10 DIAGNOSIS — J111 Influenza due to unidentified influenza virus with other respiratory manifestations: Secondary | ICD-10-CM

## 2022-10-10 DIAGNOSIS — J180 Bronchopneumonia, unspecified organism: Secondary | ICD-10-CM | POA: Diagnosis not present

## 2022-10-10 LAB — CBC WITH DIFFERENTIAL/PLATELET
Abs Immature Granulocytes: 0.06 10*3/uL (ref 0.00–0.07)
Basophils Absolute: 0 10*3/uL (ref 0.0–0.1)
Basophils Relative: 0 %
Eosinophils Absolute: 0 10*3/uL (ref 0.0–0.5)
Eosinophils Relative: 0 %
HCT: 34.3 % — ABNORMAL LOW (ref 39.0–52.0)
Hemoglobin: 11.7 g/dL — ABNORMAL LOW (ref 13.0–17.0)
Immature Granulocytes: 0 %
Lymphocytes Relative: 13 %
Lymphs Abs: 2 10*3/uL (ref 0.7–4.0)
MCH: 30.7 pg (ref 26.0–34.0)
MCHC: 34.1 g/dL (ref 30.0–36.0)
MCV: 90 fL (ref 80.0–100.0)
Monocytes Absolute: 2.1 10*3/uL — ABNORMAL HIGH (ref 0.1–1.0)
Monocytes Relative: 14 %
Neutro Abs: 10.8 10*3/uL — ABNORMAL HIGH (ref 1.7–7.7)
Neutrophils Relative %: 73 %
Platelets: 182 10*3/uL (ref 150–400)
RBC: 3.81 MIL/uL — ABNORMAL LOW (ref 4.22–5.81)
RDW: 12.9 % (ref 11.5–15.5)
WBC: 14.9 10*3/uL — ABNORMAL HIGH (ref 4.0–10.5)
nRBC: 0 % (ref 0.0–0.2)

## 2022-10-10 LAB — BASIC METABOLIC PANEL
Anion gap: 14 (ref 5–15)
BUN: 17 mg/dL (ref 6–20)
CO2: 16 mmol/L — ABNORMAL LOW (ref 22–32)
Calcium: 8.5 mg/dL — ABNORMAL LOW (ref 8.9–10.3)
Chloride: 103 mmol/L (ref 98–111)
Creatinine, Ser: 1.17 mg/dL (ref 0.61–1.24)
GFR, Estimated: >60 mL/min (ref >60)
Glucose, Bld: 113 mg/dL — ABNORMAL HIGH (ref 70–99)
Potassium: 4.1 mmol/L (ref 3.5–5.1)
Sodium: 133 mmol/L — ABNORMAL LOW (ref 135–145)

## 2022-10-10 LAB — RESP PANEL BY RT-PCR (RSV, FLU A&B, COVID)  RVPGX2
Influenza A by PCR: POSITIVE — AB
Influenza B by PCR: NEGATIVE
Resp Syncytial Virus by PCR: NEGATIVE
SARS Coronavirus 2 by RT PCR: NEGATIVE

## 2022-10-10 MED ORDER — ALBUTEROL SULFATE HFA 108 (90 BASE) MCG/ACT IN AERS: 2.0000 | INHALATION_SPRAY | RESPIRATORY_TRACT | 1 refills | Status: DC | PRN

## 2022-10-10 MED ORDER — AZITHROMYCIN 250 MG PO TABS: ORAL_TABLET | ORAL | 0 refills | Status: DC

## 2022-10-10 MED ORDER — IPRATROPIUM-ALBUTEROL 0.5-2.5 (3) MG/3ML IN SOLN
6.0000 mL | Freq: Once | RESPIRATORY_TRACT | Status: AC
  Administered 2022-10-10: 6 mL via RESPIRATORY_TRACT
  Filled 2022-10-10: qty 3

## 2022-10-10 MED ORDER — SODIUM CHLORIDE 0.9 % IV SOLN
2.0000 g | Freq: Once | INTRAVENOUS | Status: AC
  Administered 2022-10-10: 2 g via INTRAVENOUS
  Filled 2022-10-10: qty 20

## 2022-10-10 MED ORDER — IPRATROPIUM-ALBUTEROL 0.5-2.5 (3) MG/3ML IN SOLN
3.0000 mL | Freq: Once | RESPIRATORY_TRACT | Status: AC
  Administered 2022-10-10: 3 mL via RESPIRATORY_TRACT
  Filled 2022-10-10: qty 3

## 2022-10-10 MED ORDER — METHYLPREDNISOLONE SODIUM SUCC 125 MG IJ SOLR
125.0000 mg | Freq: Once | INTRAMUSCULAR | Status: AC
  Administered 2022-10-10: 125 mg via INTRAVENOUS
  Filled 2022-10-10: qty 2

## 2022-10-10 MED ORDER — PREDNISONE 10 MG (21) PO TBPK: ORAL_TABLET | ORAL | 0 refills | Status: DC

## 2022-10-10 MED ORDER — AZITHROMYCIN 500 MG PO TABS
1000.0000 mg | ORAL_TABLET | Freq: Once | ORAL | Status: AC
  Administered 2022-10-10: 1000 mg via ORAL
  Filled 2022-10-10: qty 2

## 2022-10-10 NOTE — Discharge Instructions (Addendum)
Please go to the following website to schedule new (and existing) patient appointments:   [email or web address]   The following is a list of primary care offices in the area who are accepting new patients at this time.  Please reach out to one of them directly and let them know you would like to schedule an appointment to follow up on an Emergency Department visit, and/or to establish a new primary care provider (PCP).  There are likely other primary care clinics in the are who are accepting new patients, but this is an excellent place to start:  [facility] Lead physician: Dr Angela Bacigalupo [address] [place], [place] [address] [phone]  [facility] Lead Physician: Dr Krichna Sowles [address], [place], [place] [address] [phone]  [facility]  Lead Physician: Dr Megan Johnson [address], [place], [place] [address] [phone]  [facility] Lead Physician: Dr Alex Karamalegos [address], [place], [place] [address] [phone]  [facility] [place] Lead Physician: Dr Laura Berglund [address], [place], [place] [address] [phone]   

## 2022-10-10 NOTE — ED Notes (Signed)
Ambulated in hallway. Oxygen began at 96% room air and improved to 98% room air after ambulating.

## 2022-10-10 NOTE — ED Triage Notes (Signed)
Pt comes via EMs from home with c/o cough. Pt states this started week ago. Pt stats no relief with otc meds.

## 2022-10-10 NOTE — ED Notes (Signed)
Tried three times for IV. Green top collected and sent but no IV

## 2022-10-10 NOTE — ED Provider Notes (Signed)
[facility] Provider Note    Event Date/Time   First MD Initiated Contact with Patient 10/10/22 1823     (approximate)   History   Cough   HPI  BROOK MALL is a 47 y.o. male presents to the emergency department for treatment and evaluation of cough, fatigue, and shortness of breath for the past week. He isn't sure if he has had a fever. No nausea, vomiting, or diarrhea.       Physical Exam   Triage Vital Signs: ED Triage Vitals  Enc Vitals Group     BP 10/10/22 1659 120/73     Pulse Rate 10/10/22 1659 (!) 107     Resp 10/10/22 1659 19     Temp 10/10/22 1659 98.3 F (36.8 C)     Temp src --      SpO2 10/10/22 1659 93 %     Weight --      Height --      Head Circumference --      Peak Flow --      Pain Score 10/10/22 1651 4     Pain Loc --      Pain Edu? --      Excl. in [place]? --     Most recent vital signs: Vitals:   10/10/22 1659 10/10/22 2320  BP: 120/73 132/79  Pulse: (!) 107 97  Resp: 19 18  Temp: 98.3 F (36.8 C)   SpO2: 93% 93%   General: Awake, no distress.  CV:  Good peripheral perfusion.  Resp:  Normal effort.  Wheezing and rhonchi throughout. Abd:  No distention.  Other:     ED Results / Procedures / Treatments   Labs (all labs ordered are listed, but only abnormal results are displayed) Labs Reviewed  RESP PANEL BY RT-PCR (RSV, FLU A&B, COVID)  RVPGX2 - Abnormal; Notable for the following components:      Result Value   Influenza A by PCR POSITIVE (*)    All other components within normal limits  BASIC METABOLIC PANEL - Abnormal; Notable for the following components:   Sodium 133 (*)    CO2 16 (*)    Glucose, Bld 113 (*)    Calcium 8.5 (*)    All other components within normal limits  CBC WITH DIFFERENTIAL/PLATELET - Abnormal; Notable for the following components:   WBC 14.9 (*)    RBC 3.81 (*)    Hemoglobin 11.7 (*)    HCT 34.3 (*)    Neutro Abs 10.8 (*)    Monocytes Absolute 2.1 (*)    All other  components within normal limits  CBC WITH DIFFERENTIAL/PLATELET     EKG  Not indicated   RADIOLOGY  Chest x-ray shows patchy left basilar opacity.  Images viewed and interpreted by me.  Radiology report consistent with the same.   PROCEDURES:  Critical Care performed: No  Procedures   MEDICATIONS ORDERED IN ED: Medications  ipratropium-albuterol (DUONEB) 0.5-2.5 (3) MG/3ML nebulizer solution 3 mL (3 mLs Nebulization Given 10/10/22 1850)  methylPREDNISolone sodium succinate (SOLU-MEDROL) 125 mg/2 mL injection 125 mg (125 mg Intravenous Given 10/10/22 1950)  cefTRIAXone (ROCEPHIN) 2 g in sodium chloride 0.9 % 100 mL IVPB (0 g Intravenous Stopped 10/10/22 2052)  azithromycin (ZITHROMAX) tablet 1,000 mg (1,000 mg Oral Given 10/10/22 2010)  ipratropium-albuterol (DUONEB) 0.5-2.5 (3) MG/3ML nebulizer solution 6 mL (6 mLs Nebulization Given 10/10/22 2054)     IMPRESSION / MDM / ASSESSMENT AND PLAN / ED  COURSE  I reviewed the triage vital signs and the nursing notes.                              Differential diagnosis includes, but is not limited to, influenza, COVID, RSV, pneumonia  Patient's presentation is most consistent with acute presentation with potential threat to life or bodily function.  47 year old male presenting to the emergency department for treatment and evaluation of cough for 1 week.  No relief with over-the-counter medications.  See HPI for further details.  DuoNeb and Solu-Medrol ordered.  Plan will be to get labs and chest x-ray   Clinical Course as of 10/10/22 2351  Fri Oct 10, 2022  2046 Patient continues to have wheezing and rhonchi throughout. Stacked neb treatments ordered. Will then get ambulatory oxygen saturation and consider admission. [CT]  2350 Patient significantly improved after DuoNeb treatments and medications. Ambulatory saturation is 96% on room air. He feels safe for discharge. He will be given information for primary care follow up and ER return  precautions. [CT]    Clinical Course User Index [CT] Liliauna Santoni B, FNP     FINAL CLINICAL IMPRESSION(S) / ED DIAGNOSES   Final diagnoses:  Bronchopneumonia  Influenza     Rx / DC Orders   ED Discharge Orders          Ordered    azithromycin (ZITHROMAX) 250 MG tablet        10/10/22 2335    predniSONE (STERAPRED UNI-PAK 21 TAB) 10 MG (21) TBPK tablet        10/10/22 2335    albuterol (VENTOLIN HFA) 108 (90 Base) MCG/ACT inhaler  Every 4 hours PRN        10/10/22 2335             Note:  This document was prepared using Dragon voice recognition software and may include unintentional dictation errors.   Victorino Dike, FNP 10/10/22 2351    Naaman Plummer, MD 10/12/22 [phone]

## 2022-10-20 ENCOUNTER — Emergency Department
Admission: EM | Admit: 2022-10-20 | Discharge: 2022-10-20 | Disposition: A | Discharge status: Home / Self Care | Payer: 59 | Attending: Student in an Organized Health Care Education/Training Program | Admitting: Student in an Organized Health Care Education/Training Program

## 2022-10-20 ENCOUNTER — Emergency Department: Payer: 59

## 2022-10-20 ENCOUNTER — Other Ambulatory Visit: Payer: Self-pay

## 2022-10-20 DIAGNOSIS — R0602 Shortness of breath: Secondary | ICD-10-CM | POA: Insufficient documentation

## 2022-10-20 DIAGNOSIS — Z1152 Encounter for screening for COVID-19: Secondary | ICD-10-CM | POA: Insufficient documentation

## 2022-10-20 DIAGNOSIS — R052 Subacute cough: Secondary | ICD-10-CM | POA: Insufficient documentation

## 2022-10-20 DIAGNOSIS — J189 Pneumonia, unspecified organism: Secondary | ICD-10-CM | POA: Diagnosis not present

## 2022-10-20 LAB — CBC WITH DIFFERENTIAL/PLATELET
Abs Immature Granulocytes: 0.02 10*3/uL (ref 0.00–0.07)
Basophils Absolute: 0.1 10*3/uL (ref 0.0–0.1)
Basophils Relative: 1 %
Eosinophils Absolute: 0 10*3/uL (ref 0.0–0.5)
Eosinophils Relative: 1 %
HCT: 40.1 % (ref 39.0–52.0)
Hemoglobin: 13.9 g/dL (ref 13.0–17.0)
Immature Granulocytes: 0 %
Lymphocytes Relative: 37 %
Lymphs Abs: 2.7 10*3/uL (ref 0.7–4.0)
MCH: 30.5 pg (ref 26.0–34.0)
MCHC: 34.7 g/dL (ref 30.0–36.0)
MCV: 88.1 fL (ref 80.0–100.0)
Monocytes Absolute: 0.4 10*3/uL (ref 0.1–1.0)
Monocytes Relative: 5 %
Neutro Abs: 4.1 10*3/uL (ref 1.7–7.7)
Neutrophils Relative %: 56 %
Platelets: 343 10*3/uL (ref 150–400)
RBC: 4.55 MIL/uL (ref 4.22–5.81)
RDW: 12.6 % (ref 11.5–15.5)
WBC: 7.3 10*3/uL (ref 4.0–10.5)
nRBC: 0 % (ref 0.0–0.2)

## 2022-10-20 LAB — RESP PANEL BY RT-PCR (RSV, FLU A&B, COVID)  RVPGX2
Influenza A by PCR: NEGATIVE
Influenza B by PCR: NEGATIVE
Resp Syncytial Virus by PCR: NEGATIVE
SARS Coronavirus 2 by RT PCR: NEGATIVE

## 2022-10-20 LAB — BASIC METABOLIC PANEL
Anion gap: 15 (ref 5–15)
BUN: 11 mg/dL (ref 6–20)
CO2: 16 mmol/L — ABNORMAL LOW (ref 22–32)
Calcium: 8.5 mg/dL — ABNORMAL LOW (ref 8.9–10.3)
Chloride: 105 mmol/L (ref 98–111)
Creatinine, Ser: 0.79 mg/dL (ref 0.61–1.24)
GFR, Estimated: >60 mL/min (ref >60)
Glucose, Bld: 77 mg/dL (ref 70–99)
Potassium: 3.7 mmol/L (ref 3.5–5.1)
Sodium: 136 mmol/L (ref 135–145)

## 2022-10-20 MED ORDER — ALBUTEROL SULFATE HFA 108 (90 BASE) MCG/ACT IN AERS
2.0000 | INHALATION_SPRAY | RESPIRATORY_TRACT | Status: DC | PRN
  Filled 2022-10-20: qty 6.7

## 2022-10-20 MED ORDER — BENZONATATE 100 MG PO CAPS: 100.0000 mg | ORAL_CAPSULE | Freq: Three times a day (TID) | ORAL | 0 refills | Status: DC | PRN

## 2022-10-20 MED ORDER — PREDNISONE 50 MG PO TABS: 50.0000 mg | ORAL_TABLET | Freq: Every day | ORAL | 0 refills | Status: DC

## 2022-10-20 NOTE — ED Notes (Signed)
First nurse note- pt brought in  via ems from home, pt ambulatory.  Pt has diff breathing.  Hx flu, pneumonia.cbg 117, bp 122/50, p-80. Sats 98% per ems.

## 2022-10-20 NOTE — ED Triage Notes (Signed)
Diagnosed with pneumonia and flu 2 weeks ago. Pt took medications as prescribed and reports that he stopped smoking cigarettes at the same time. Pt reports worsening SOB with gasping happening when lying on his side. Pt reports gasping for air intermittently began after upper back surgery in August. Pt denies pain/fever.

## 2022-10-20 NOTE — ED Provider Notes (Signed)
[facility] Provider Note  Patient Contact: 10:34 PM (approximate)   History   Shortness of Breath   HPI  Lance Green is a 47 y.o. male who presents the emergency department concern for ongoing cough.  Patient states that he has had off-and-on issues since he had neck surgery in August.  He has had some intermittent hiccups.  The roughly 3 weeks ago he decided that he needed to stop smoking, stop smoking and had flu, then had pneumonia.  Patient states that he still has a cough and wanted to make sure nothing else was wrong.  He denies any chest pain, shortness of breath, fevers, chills, nasal congestion, sore throat.  No GI complaints.  Patient states that he took all the medicine prescribed from his flu and his pneumonia diagnosis.  He states that overall he feels much better but still has a little bit of a cough and wanted to ensure there is no other complications or issues at this time.     Physical Exam   Triage Vital Signs: ED Triage Vitals  Enc Vitals Group     BP 10/20/22 1909 (!) 140/94     Pulse Rate 10/20/22 1909 80     Resp 10/20/22 1909 16     Temp 10/20/22 1909 (!) 97.5 F (36.4 C)     Temp Source 10/20/22 1909 Oral     SpO2 10/20/22 1909 99 %     Weight 10/20/22 1910 150 lb (68 kg)     Height 10/20/22 1910 5\' 8"  (1.727 m)     Head Circumference --      Peak Flow --      Pain Score 10/20/22 1909 0     Pain Loc --      Pain Edu? --      Excl. in [place]? --     Most recent vital signs: Vitals:   10/20/22 1909 10/20/22 2246  BP: (!) 140/94 120/82  Pulse: 80 89  Resp: 16 16  Temp: (!) 97.5 F (36.4 C) 98 F (36.7 C)  SpO2: 99% 96%     General: Alert and in no acute distress. ENT:      Ears:       Nose: No congestion/rhinnorhea.      Mouth/Throat: Mucous membranes are moist. Neck: No stridor. No cervical spine tenderness to palpation.  Cardiovascular:  Good peripheral perfusion.  Normal S1 and S2.  No appreciable murmurs,  rubs, gallops. Respiratory: Normal respiratory effort without tachypnea or retractions. Lungs CTAB. Good air entry to the bases with no decreased or absent breath sounds Musculoskeletal: Full range of motion to all extremities.  Neurologic:  No gross focal neurologic deficits are appreciated.  Skin:   No rash noted Other:   ED Results / Procedures / Treatments   Labs (all labs ordered are listed, but only abnormal results are displayed) Labs Reviewed  BASIC METABOLIC PANEL - Abnormal; Notable for the following components:      Result Value   CO2 16 (*)    Calcium 8.5 (*)    All other components within normal limits  RESP PANEL BY RT-PCR (RSV, FLU A&B, COVID)  RVPGX2  CBC WITH DIFFERENTIAL/PLATELET     EKG  ED ECG REPORT I, Charline Bills Rayner Erman,  personally viewed and interpreted this ECG.   Date: 10/20/2022  EKG Time: 1929 hrs.  Rate: 77 bpm  Rhythm: unchanged from previous tracings, normal sinus rhythm  Axis: Normal axis  Intervals:none  ST&T Change: No gross ST elevation or depression noted   Normal sinus rhythm.  Slightly peaked T waves in the anterolateral leads.  Otherwise no significant concerning findings.  No significant change from previous EKG of 03/25/2022    RADIOLOGY  I personally viewed, evaluated, and interpreted these images as part of my medical decision making, as well as reviewing the written report by the radiologist.  ED Provider Interpretation: No acute cardiopulmonary abnormality on chest x-ray  DG Chest 2 View  Result Date: 10/20/2022 CLINICAL DATA:  Short of breath, pneumonia EXAM: CHEST - 2 VIEW COMPARISON:  10/10/2022 FINDINGS: Frontal and lateral views of the chest demonstrate an unremarkable cardiac silhouette. No airspace disease, effusion, or pneumothorax. No acute bony abnormality. IMPRESSION: 1. No acute intrathoracic process. Electronically Signed   By: Randa Ngo M.D.   On: 10/20/2022 19:45    PROCEDURES:  Critical Care  performed: No  Procedures   MEDICATIONS ORDERED IN ED: Medications  albuterol (VENTOLIN HFA) 108 (90 Base) MCG/ACT inhaler 2 puff (has no administration in time range)     IMPRESSION / MDM / ASSESSMENT AND PLAN / ED COURSE  I reviewed the triage vital signs and the nursing notes.                                 Differential diagnosis includes, but is not limited to, pneumonia, bronchitis, PE, COVID, flu, RSV, STEMI, NSTEMI  Patient's presentation is most consistent with acute presentation with potential threat to life or bodily function.   Patient's diagnosis is consistent with cough.  Patient presents emergency department with ongoing cough.  Patient has had some issues with hiccups after having a cervical spine surgery that is still ongoing.  No changes from baseline.  Patient is also complaining of some cough that has been ongoing for the last 3 weeks.  Patient decided to stop smoking, started to have a cough.  This worsen and was diagnosed first with flu and then pneumonia.  Patient is taking medication for same and states that the cough is much improved from his diagnosis of flu and pneumonia.  He still has a light cough and wanted to ensure no other complications.  Workup today is reassuring with reassuring labs, EKG, chest x-ray.  No evidence of consolidation concerning for pneumonia.  No significant peribronchial thickening.  Suspect that cough is likely secondary to recent illness as well as recent smoking cessation.  This time no indication for further workup.  Follow-up primary care as needed.  Patient is given ED precautions to return to the ED for any worsening or new symptoms.     FINAL CLINICAL IMPRESSION(S) / ED DIAGNOSES   Final diagnoses:  Subacute cough     Rx / DC Orders   ED Discharge Orders          Ordered    benzonatate (TESSALON PERLES) 100 MG capsule  3 times daily PRN        10/20/22 2241    predniSONE (DELTASONE) 50 MG tablet  Daily with breakfast         10/20/22 2241             Note:  This document was prepared using Dragon voice recognition software and may include unintentional dictation errors.   Brynda Peon 10/20/22 2333    Merlyn Lot, MD 10/21/22 [phone]

## 2022-11-05 ENCOUNTER — Ambulatory Visit: Payer: Self-pay | Admitting: Gerontology

## 2022-11-11 NOTE — Congregational Nurse Program (Signed)
  Dept: [facility] Nurse Program Note  Date of Encounter: 11/11/2022 Client states he has completed  his prednisone taper and used up one inhaler of albuteral. States he continues to have a productive cough with whitish phlegm. States he has another refill for inhaler. RN reviewed proper usage of inhaler and encouraged deep breathing exercises.slight lower congestion noted in lower lobes bilaterally. Advised client to contact PCP for follow up or if symptoms of fever, sob or discomfort with respirations to go to ED.   Past Medical History: Past Medical History:  Diagnosis Date   Alcohol abuse    HTN (hypertension)    Neuropathy    Pancreatitis    Peptic ulcer disease     Encounter Details:  CNP Questionnaire - 11/11/22 1116       Questionnaire   Ask client: Do you give verbal consent for me to treat you today? Yes    Student Assistance N/A    Location Patient [place]    Visit Setting with Client Organization    Patient Status Unknown   client reports he is not homeless   Insurance Uninsured ([place] Card/Care Connects/Self-Pay/Medicaid Family Planning)    Insurance/Financial Assistance Referral N/A    Medication N/A   unclear at this time   Medical Provider No    Screening Referrals Made N/A    Medical Referrals Made N/A   Discussed application for [facility], client did not want to stay long enough to complete, but stated he would RTC next week   Medical Appointment Made N/A    Recently w/o PCP, now 1st time PCP visit completed due to CNs referral or appointment made N/A    Food N/A    Transportation N/A   [place]ses the [employer] system   Housing/Utilities N/A   clientt mentioned living with his mother   Interpersonal Safety N/A    Interventions Advocate/Support;Reviewed Medications    Abnormal to Normal Screening Since Last CN Visit Blood Pressure    Screenings CN Performed N/A    Sent Client to Lab for: N/A    Did client attend any of the  following based off CNs referral or appointments made? N/A    ED Visit Averted N/A    Life-Saving Intervention Made N/A

## 2022-11-13 ENCOUNTER — Other Ambulatory Visit: Payer: Self-pay

## 2022-11-13 ENCOUNTER — Emergency Department: Payer: No Typology Code available for payment source

## 2022-11-13 ENCOUNTER — Emergency Department
Admission: EM | Admit: 2022-11-13 | Discharge: 2022-11-14 | Disposition: A | Discharge status: Home / Self Care | Payer: No Typology Code available for payment source | Attending: Emergency Medicine | Admitting: Emergency Medicine

## 2022-11-13 DIAGNOSIS — S20312A Abrasion of left front wall of thorax, initial encounter: Secondary | ICD-10-CM | POA: Diagnosis not present

## 2022-11-13 DIAGNOSIS — F10129 Alcohol abuse with intoxication, unspecified: Secondary | ICD-10-CM | POA: Insufficient documentation

## 2022-11-13 DIAGNOSIS — Z041 Encounter for examination and observation following transport accident: Secondary | ICD-10-CM | POA: Diagnosis not present

## 2022-11-13 DIAGNOSIS — T7411XA Adult physical abuse, confirmed, initial encounter: Secondary | ICD-10-CM | POA: Diagnosis not present

## 2022-11-13 DIAGNOSIS — I10 Essential (primary) hypertension: Secondary | ICD-10-CM | POA: Diagnosis not present

## 2022-11-13 DIAGNOSIS — Y908 Blood alcohol level of 240 mg/100 ml or more: Secondary | ICD-10-CM | POA: Diagnosis not present

## 2022-11-13 DIAGNOSIS — S3993XA Unspecified injury of pelvis, initial encounter: Secondary | ICD-10-CM | POA: Diagnosis not present

## 2022-11-13 DIAGNOSIS — R140 Abdominal distension (gaseous): Secondary | ICD-10-CM | POA: Diagnosis not present

## 2022-11-13 DIAGNOSIS — S0003XA Contusion of scalp, initial encounter: Secondary | ICD-10-CM | POA: Diagnosis not present

## 2022-11-13 DIAGNOSIS — F1010 Alcohol abuse, uncomplicated: Secondary | ICD-10-CM | POA: Diagnosis not present

## 2022-11-13 DIAGNOSIS — R220 Localized swelling, mass and lump, head: Secondary | ICD-10-CM | POA: Diagnosis not present

## 2022-11-13 DIAGNOSIS — S3992XA Unspecified injury of lower back, initial encounter: Secondary | ICD-10-CM | POA: Diagnosis not present

## 2022-11-13 DIAGNOSIS — S0993XA Unspecified injury of face, initial encounter: Secondary | ICD-10-CM | POA: Diagnosis not present

## 2022-11-13 DIAGNOSIS — R102 Pelvic and perineal pain: Secondary | ICD-10-CM | POA: Diagnosis not present

## 2022-11-13 DIAGNOSIS — S299XXA Unspecified injury of thorax, initial encounter: Secondary | ICD-10-CM | POA: Diagnosis not present

## 2022-11-13 DIAGNOSIS — S3991XA Unspecified injury of abdomen, initial encounter: Secondary | ICD-10-CM | POA: Diagnosis not present

## 2022-11-13 DIAGNOSIS — S069X9A Unspecified intracranial injury with loss of consciousness of unspecified duration, initial encounter: Secondary | ICD-10-CM | POA: Diagnosis not present

## 2022-11-13 DIAGNOSIS — S0031XA Abrasion of nose, initial encounter: Secondary | ICD-10-CM | POA: Diagnosis not present

## 2022-11-13 DIAGNOSIS — F1090 Alcohol use, unspecified, uncomplicated: Secondary | ICD-10-CM

## 2022-11-13 DIAGNOSIS — S199XXA Unspecified injury of neck, initial encounter: Secondary | ICD-10-CM | POA: Diagnosis not present

## 2022-11-13 LAB — COMPREHENSIVE METABOLIC PANEL
ALT: 19 U/L (ref 0–44)
AST: 34 U/L (ref 15–41)
Albumin: 3.7 g/dL (ref 3.5–5.0)
Alkaline Phosphatase: 91 U/L (ref 38–126)
Anion gap: 11 (ref 5–15)
BUN: 10 mg/dL (ref 6–20)
CO2: 16 mmol/L — ABNORMAL LOW (ref 22–32)
Calcium: 8.4 mg/dL — ABNORMAL LOW (ref 8.9–10.3)
Chloride: 107 mmol/L (ref 98–111)
Creatinine, Ser: 0.77 mg/dL (ref 0.61–1.24)
GFR, Estimated: >60 mL/min (ref >60)
Glucose, Bld: 92 mg/dL (ref 70–99)
Potassium: 3.9 mmol/L (ref 3.5–5.1)
Sodium: 134 mmol/L — ABNORMAL LOW (ref 135–145)
Total Bilirubin: 0.6 mg/dL (ref 0.3–1.2)
Total Protein: 7.4 g/dL (ref 6.5–8.1)

## 2022-11-13 LAB — CBC WITH DIFFERENTIAL/PLATELET
Abs Immature Granulocytes: 0.02 10*3/uL (ref 0.00–0.07)
Basophils Absolute: 0 10*3/uL (ref 0.0–0.1)
Basophils Relative: 1 %
Eosinophils Absolute: 0.1 10*3/uL (ref 0.0–0.5)
Eosinophils Relative: 2 %
HCT: 38.1 % — ABNORMAL LOW (ref 39.0–52.0)
Hemoglobin: 12.1 g/dL — ABNORMAL LOW (ref 13.0–17.0)
Immature Granulocytes: 0 %
Lymphocytes Relative: 45 %
Lymphs Abs: 2.7 10*3/uL (ref 0.7–4.0)
MCH: 31.1 pg (ref 26.0–34.0)
MCHC: 31.8 g/dL (ref 30.0–36.0)
MCV: 97.9 fL (ref 80.0–100.0)
Monocytes Absolute: 0.4 10*3/uL (ref 0.1–1.0)
Monocytes Relative: 6 %
Neutro Abs: 2.8 10*3/uL (ref 1.7–7.7)
Neutrophils Relative %: 46 %
Platelets: 204 10*3/uL (ref 150–400)
RBC: 3.89 MIL/uL — ABNORMAL LOW (ref 4.22–5.81)
RDW: 14.5 % (ref 11.5–15.5)
WBC: 6.1 10*3/uL (ref 4.0–10.5)
nRBC: 0 % (ref 0.0–0.2)

## 2022-11-13 LAB — ETHANOL: Alcohol, Ethyl (B): 258 mg/dL — ABNORMAL HIGH (ref <10)
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
## 2022-11-14 DIAGNOSIS — S0993XA Unspecified injury of face, initial encounter: Secondary | ICD-10-CM | POA: Diagnosis not present

## 2022-11-14 DIAGNOSIS — S3991XA Unspecified injury of abdomen, initial encounter: Secondary | ICD-10-CM | POA: Diagnosis not present

## 2022-11-14 DIAGNOSIS — Z041 Encounter for examination and observation following transport accident: Secondary | ICD-10-CM | POA: Diagnosis not present

## 2022-11-14 DIAGNOSIS — R102 Pelvic and perineal pain: Secondary | ICD-10-CM | POA: Diagnosis not present

## 2022-11-14 DIAGNOSIS — S299XXA Unspecified injury of thorax, initial encounter: Secondary | ICD-10-CM | POA: Diagnosis not present

## 2022-11-14 DIAGNOSIS — S069X9A Unspecified intracranial injury with loss of consciousness of unspecified duration, initial encounter: Secondary | ICD-10-CM | POA: Diagnosis not present

## 2022-11-14 DIAGNOSIS — S3993XA Unspecified injury of pelvis, initial encounter: Secondary | ICD-10-CM | POA: Diagnosis not present

## 2022-11-14 DIAGNOSIS — S3992XA Unspecified injury of lower back, initial encounter: Secondary | ICD-10-CM | POA: Diagnosis not present

## 2022-11-14 DIAGNOSIS — R140 Abdominal distension (gaseous): Secondary | ICD-10-CM | POA: Diagnosis not present

## 2022-11-14 DIAGNOSIS — R220 Localized swelling, mass and lump, head: Secondary | ICD-10-CM | POA: Diagnosis not present

## 2022-11-14 DIAGNOSIS — S199XXA Unspecified injury of neck, initial encounter: Secondary | ICD-10-CM | POA: Diagnosis not present

## 2022-11-14 DIAGNOSIS — S0003XA Contusion of scalp, initial encounter: Secondary | ICD-10-CM | POA: Diagnosis not present

## 2022-11-14 LAB — URINE DRUG SCREEN, QUALITATIVE (ARMC ONLY)
Amphetamines, Ur Screen: NOT DETECTED
Barbiturates, Ur Screen: NOT DETECTED
Benzodiazepine, Ur Scrn: NOT DETECTED
Cannabinoid 50 Ng, Ur ~~LOC~~: NOT DETECTED
Cocaine Metabolite,Ur ~~LOC~~: NOT DETECTED
MDMA (Ecstasy)Ur Screen: NOT DETECTED
Methadone Scn, Ur: NOT DETECTED
Opiate, Ur Screen: NOT DETECTED
Phencyclidine (PCP) Ur S: NOT DETECTED
Tricyclic, Ur Screen: NOT DETECTED

## 2022-11-14 NOTE — ED Provider Notes (Signed)
-----------------------------------------   6:35 AM on 11/14/2022 -----------------------------------------   No events overnight.  Patient remains asleep in no acute distress.  Anticipate discharge home once patient is sober and ambulatory with steady gait.  Care will be transferred to the oncoming provider at change of shift.   Paulette Blanch, MD 11/14/22 [phone]

## 2022-11-14 NOTE — ED Provider Notes (Signed)
Procedures     ----------------------------------------- 8:02 AM on 11/14/2022 -----------------------------------------  Pt is now clinically sober. Ambulatory with steady gait. Clear speech. Using phone to arrange transportation home.    Carrie Mew, MD 11/14/22 [phone]

## 2022-11-14 NOTE — ED Notes (Addendum)
Pt moved to hall bed at this time.  Pt awakens easily to voice.  Pt updated on plan of care at this time.  Denies any needs at this time.  Given instruction on use of call light.  This Probation officer informed pt that he will need to try and call ride soon.  Pt states that he does not have phone.  This Probation officer told pt that phone will be provided for him to use.

## 2022-11-14 NOTE — ED Notes (Addendum)
Pt is A&Ox4. Pt denies pain.  Pt given phone to call a ride.

## 2022-11-14 NOTE — BH Assessment (Signed)
Writer provided pt with resources for detox/substance abuse treatment.
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
## 2022-11-15 DIAGNOSIS — S92352A Displaced fracture of fifth metatarsal bone, left foot, initial encounter for closed fracture: Secondary | ICD-10-CM | POA: Diagnosis not present

## 2022-11-15 DIAGNOSIS — E1140 Type 2 diabetes mellitus with diabetic neuropathy, unspecified: Secondary | ICD-10-CM | POA: Diagnosis not present

## 2022-11-15 DIAGNOSIS — M25562 Pain in left knee: Secondary | ICD-10-CM | POA: Diagnosis not present

## 2023-03-11 ENCOUNTER — Other Ambulatory Visit: Payer: Self-pay

## 2023-03-11 DIAGNOSIS — G6289 Other specified polyneuropathies: Secondary | ICD-10-CM | POA: Insufficient documentation

## 2023-03-11 DIAGNOSIS — G629 Polyneuropathy, unspecified: Secondary | ICD-10-CM | POA: Diagnosis not present

## 2023-03-11 DIAGNOSIS — R569 Unspecified convulsions: Secondary | ICD-10-CM | POA: Diagnosis not present

## 2023-03-11 DIAGNOSIS — M79641 Pain in right hand: Secondary | ICD-10-CM | POA: Diagnosis not present

## 2023-03-11 DIAGNOSIS — Z743 Need for continuous supervision: Secondary | ICD-10-CM | POA: Diagnosis not present

## 2023-03-11 DIAGNOSIS — I10 Essential (primary) hypertension: Secondary | ICD-10-CM | POA: Diagnosis not present

## 2023-03-11 MED ORDER — ACETAMINOPHEN 500 MG PO TABS
1000.0000 mg | ORAL_TABLET | Freq: Once | ORAL | Status: AC
  Administered 2023-03-11: 1000 mg via ORAL
  Filled 2023-03-11: qty 2
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
## 2023-03-12 ENCOUNTER — Emergency Department
Admission: EM | Admit: 2023-03-12 | Discharge: 2023-03-12 | Disposition: A | Discharge status: Home / Self Care | Payer: 59 | Attending: Emergency Medicine | Admitting: Emergency Medicine

## 2023-03-12 DIAGNOSIS — G6289 Other specified polyneuropathies: Secondary | ICD-10-CM

## 2023-03-12 MED ORDER — OXYCODONE HCL 5 MG PO TABS
5.0000 mg | ORAL_TABLET | Freq: Once | ORAL | Status: AC
  Administered 2023-03-12: 5 mg via ORAL
  Filled 2023-03-12: qty 1

## 2023-03-12 MED ORDER — KETOROLAC TROMETHAMINE 30 MG/ML IJ SOLN
30.0000 mg | Freq: Once | INTRAMUSCULAR | Status: AC
  Administered 2023-03-12: 30 mg via INTRAMUSCULAR
  Filled 2023-03-12: qty 1

## 2023-03-12 MED ORDER — GABAPENTIN 100 MG PO CAPS: 100.0000 mg | ORAL_CAPSULE | Freq: Three times a day (TID) | ORAL | 0 refills | Status: DC

## 2023-03-12 NOTE — ED Provider Notes (Signed)
   [facility] Provider Note    Event Date/Time   First MD Initiated Contact with Patient 03/12/23 0424     (approximate)   History   Peripheral Neuropathy and Alcohol Intoxication   HPI  Lance Green is a 47 y.o. male who presents to the ED for evaluation of Peripheral Neuropathy and Alcohol Intoxication   Patient presents with acute on chronic bilateral atraumatic hand pain that he attributes to his neuropathy.  Takes no medications regularly.   Physical Exam   Triage Vital Signs: ED Triage Vitals  Enc Vitals Group     BP 03/11/23 2255 (!) 135/97     Pulse Rate 03/11/23 2255 80     Resp 03/11/23 2255 20     Temp 03/11/23 2255 97.6 F (36.4 C)     Temp Source 03/11/23 2255 Oral     SpO2 03/11/23 2255 100 %     Weight 03/11/23 2256 145 lb (65.8 kg)     Height --      Head Circumference --      Peak Flow --      Pain Score 03/11/23 2255 10     Pain Loc --      Pain Edu? --      Excl. in GC? --     Most recent vital signs: Vitals:   03/11/23 2255  BP: (!) 135/97  Pulse: 80  Resp: 20  Temp: 97.6 F (36.4 C)  SpO2: 100%    General: Awake, no distress.  CV:  Good peripheral perfusion.  Resp:  Normal effort.  Abd:  No distention.  MSK:  No deformity noted.  Neuro:  No focal deficits appreciated. Other:  No signs of ischemia, trauma or injuries or deformity to the bilateral hands.  Strong bilateral symmetric radial pulses   ED Results / Procedures / Treatments   Labs (all labs ordered are listed, but only abnormal results are displayed) Labs Reviewed - No data to display  EKG   RADIOLOGY   Official radiology report(s): No results found.  PROCEDURES and INTERVENTIONS:  Procedures  Medications  acetaminophen (TYLENOL) tablet 1,000 mg (1,000 mg Oral Given 03/11/23 2258)  ketorolac (TORADOL) 30 MG/ML injection 30 mg (30 mg Intramuscular Given 03/12/23 0644)  oxyCODONE (Oxy IR/ROXICODONE) immediate release tablet 5 mg (5 mg  Oral Given 03/12/23 0644)     IMPRESSION / MDM / ASSESSMENT AND PLAN / ED COURSE  I reviewed the triage vital signs and the nursing notes.  Differential diagnosis includes, but is not limited to, neuropathy, chronic pain, malingering, ischemia, cellulitis  Patient presents with acute on chronic pain that is likely neuropathy.  Reassuring exam and vital signs.  No indications for serum diagnostics or imaging.  Provided analgesia and for prescription for gabapentin.      FINAL CLINICAL IMPRESSION(S) / ED DIAGNOSES   Final diagnoses:  Other polyneuropathy     Rx / DC Orders   ED Discharge Orders          Ordered    gabapentin (NEURONTIN) 100 MG capsule  3 times daily        03/12/23 0631             Note:  This document was prepared using Dragon voice recognition software and may include unintentional dictation errors.   Delton Prairie, MD 03/12/23 [phone]
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
## 2023-04-23 ENCOUNTER — Inpatient Hospital Stay: Payer: 59

## 2023-04-23 ENCOUNTER — Other Ambulatory Visit: Payer: Self-pay

## 2023-04-23 ENCOUNTER — Observation Stay
Admission: EM | Admit: 2023-04-23 | Discharge: 2023-04-25 | Disposition: A | Discharge status: Home / Self Care | Payer: 59 | Attending: Internal Medicine | Admitting: Internal Medicine

## 2023-04-23 DIAGNOSIS — W19XXXA Unspecified fall, initial encounter: Secondary | ICD-10-CM | POA: Diagnosis present

## 2023-04-23 DIAGNOSIS — F17210 Nicotine dependence, cigarettes, uncomplicated: Secondary | ICD-10-CM | POA: Insufficient documentation

## 2023-04-23 DIAGNOSIS — Z79899 Other long term (current) drug therapy: Secondary | ICD-10-CM

## 2023-04-23 DIAGNOSIS — F1010 Alcohol abuse, uncomplicated: Secondary | ICD-10-CM | POA: Diagnosis present

## 2023-04-23 DIAGNOSIS — F10232 Alcohol dependence with withdrawal with perceptual disturbance: Principal | ICD-10-CM | POA: Insufficient documentation

## 2023-04-23 DIAGNOSIS — R5383 Other fatigue: Secondary | ICD-10-CM | POA: Diagnosis not present

## 2023-04-23 DIAGNOSIS — K760 Fatty (change of) liver, not elsewhere classified: Secondary | ICD-10-CM | POA: Insufficient documentation

## 2023-04-23 DIAGNOSIS — Z8711 Personal history of peptic ulcer disease: Secondary | ICD-10-CM | POA: Diagnosis not present

## 2023-04-23 DIAGNOSIS — Z981 Arthrodesis status: Secondary | ICD-10-CM | POA: Diagnosis not present

## 2023-04-23 DIAGNOSIS — D696 Thrombocytopenia, unspecified: Secondary | ICD-10-CM | POA: Diagnosis not present

## 2023-04-23 DIAGNOSIS — Z716 Tobacco abuse counseling: Secondary | ICD-10-CM | POA: Diagnosis not present

## 2023-04-23 DIAGNOSIS — R7301 Impaired fasting glucose: Secondary | ICD-10-CM

## 2023-04-23 DIAGNOSIS — G629 Polyneuropathy, unspecified: Secondary | ICD-10-CM | POA: Diagnosis not present

## 2023-04-23 DIAGNOSIS — G4089 Other seizures: Secondary | ICD-10-CM | POA: Diagnosis present

## 2023-04-23 DIAGNOSIS — F10239 Alcohol dependence with withdrawal, unspecified: Principal | ICD-10-CM | POA: Diagnosis present

## 2023-04-23 DIAGNOSIS — R7401 Elevation of levels of liver transaminase levels: Secondary | ICD-10-CM | POA: Diagnosis not present

## 2023-04-23 DIAGNOSIS — E876 Hypokalemia: Secondary | ICD-10-CM | POA: Diagnosis present

## 2023-04-23 DIAGNOSIS — F10939 Alcohol use, unspecified with withdrawal, unspecified: Secondary | ICD-10-CM

## 2023-04-23 DIAGNOSIS — D6959 Other secondary thrombocytopenia: Secondary | ICD-10-CM | POA: Diagnosis present

## 2023-04-23 DIAGNOSIS — R569 Unspecified convulsions: Secondary | ICD-10-CM

## 2023-04-23 DIAGNOSIS — Z743 Need for continuous supervision: Secondary | ICD-10-CM | POA: Diagnosis not present

## 2023-04-23 DIAGNOSIS — F10930 Alcohol use, unspecified with withdrawal, uncomplicated: Principal | ICD-10-CM

## 2023-04-23 DIAGNOSIS — I10 Essential (primary) hypertension: Secondary | ICD-10-CM | POA: Diagnosis not present

## 2023-04-23 LAB — CBC WITH DIFFERENTIAL/PLATELET
Abs Immature Granulocytes: 0.01 10*3/uL (ref 0.00–0.07)
Basophils Absolute: 0 10*3/uL (ref 0.0–0.1)
Basophils Relative: 0 %
Eosinophils Absolute: 0 10*3/uL (ref 0.0–0.5)
Eosinophils Relative: 0 %
HCT: 30.4 % — ABNORMAL LOW (ref 39.0–52.0)
Hemoglobin: 11 g/dL — ABNORMAL LOW (ref 13.0–17.0)
Immature Granulocytes: 0 %
Lymphocytes Relative: 15 %
Lymphs Abs: 0.7 10*3/uL (ref 0.7–4.0)
MCH: 32.8 pg (ref 26.0–34.0)
MCHC: 36.2 g/dL — ABNORMAL HIGH (ref 30.0–36.0)
MCV: 90.7 fL (ref 80.0–100.0)
Monocytes Absolute: 0.4 10*3/uL (ref 0.1–1.0)
Monocytes Relative: 8 %
Neutro Abs: 3.5 10*3/uL (ref 1.7–7.7)
Neutrophils Relative %: 77 %
Platelets: 113 10*3/uL — ABNORMAL LOW (ref 150–400)
RBC: 3.35 MIL/uL — ABNORMAL LOW (ref 4.22–5.81)
RDW: 12.9 % (ref 11.5–15.5)
WBC: 4.5 10*3/uL (ref 4.0–10.5)
nRBC: 0 % (ref 0.0–0.2)

## 2023-04-23 LAB — ACETAMINOPHEN LEVEL: Acetaminophen (Tylenol), Serum: <10 ug/mL — ABNORMAL LOW (ref 10–30)

## 2023-04-23 LAB — COMPREHENSIVE METABOLIC PANEL
ALT: 50 U/L — ABNORMAL HIGH (ref 0–44)
AST: 107 U/L — ABNORMAL HIGH (ref 15–41)
Albumin: 4.2 g/dL (ref 3.5–5.0)
Alkaline Phosphatase: 138 U/L — ABNORMAL HIGH (ref 38–126)
Anion gap: 11 (ref 5–15)
BUN: 7 mg/dL (ref 6–20)
CO2: 25 mmol/L (ref 22–32)
Calcium: 8.9 mg/dL (ref 8.9–10.3)
Chloride: 98 mmol/L (ref 98–111)
Creatinine, Ser: 0.86 mg/dL (ref 0.61–1.24)
GFR, Estimated: >60 mL/min (ref >60)
Glucose, Bld: 186 mg/dL — ABNORMAL HIGH (ref 70–99)
Potassium: 3.4 mmol/L — ABNORMAL LOW (ref 3.5–5.1)
Sodium: 134 mmol/L — ABNORMAL LOW (ref 135–145)
Total Bilirubin: 2.1 mg/dL — ABNORMAL HIGH (ref 0.3–1.2)
Total Protein: 7.7 g/dL (ref 6.5–8.1)

## 2023-04-23 LAB — LIPASE, BLOOD: Lipase: 22 U/L (ref 11–51)

## 2023-04-23 LAB — ETHANOL: Alcohol, Ethyl (B): <10 mg/dL (ref <10)

## 2023-04-23 LAB — SALICYLATE LEVEL: Salicylate Lvl: <7 mg/dL — ABNORMAL LOW (ref 7.0–30.0)

## 2023-04-23 MED ORDER — LORAZEPAM 2 MG/ML IJ SOLN: 1.0000 mg | INTRAMUSCULAR | Status: DC | PRN

## 2023-04-23 MED ORDER — ONDANSETRON HCL 4 MG PO TABS: 4.0000 mg | ORAL_TABLET | Freq: Four times a day (QID) | ORAL | Status: DC | PRN

## 2023-04-23 MED ORDER — FOLIC ACID 1 MG PO TABS
1.0000 mg | ORAL_TABLET | Freq: Every day | ORAL | Status: DC
  Administered 2023-04-23 – 2023-04-25 (×3): 1 mg via ORAL
  Filled 2023-04-23 (×3): qty 1

## 2023-04-23 MED ORDER — NICOTINE 14 MG/24HR TD PT24
14.0000 mg | MEDICATED_PATCH | TRANSDERMAL | Status: DC
  Administered 2023-04-23 – 2023-04-24 (×2): 14 mg via TRANSDERMAL
  Filled 2023-04-23 (×2): qty 1

## 2023-04-23 MED ORDER — SODIUM CHLORIDE 0.9 % IV SOLN: INTRAVENOUS | Status: DC

## 2023-04-23 MED ORDER — ENOXAPARIN SODIUM 40 MG/0.4ML IJ SOSY
40.0000 mg | PREFILLED_SYRINGE | INTRAMUSCULAR | Status: DC
  Administered 2023-04-24: 40 mg via SUBCUTANEOUS
  Filled 2023-04-23 (×2): qty 0.4

## 2023-04-23 MED ORDER — ONDANSETRON HCL 4 MG/2ML IJ SOLN
4.0000 mg | Freq: Once | INTRAMUSCULAR | Status: AC
  Administered 2023-04-23: 4 mg via INTRAVENOUS
  Filled 2023-04-23: qty 2

## 2023-04-23 MED ORDER — ADULT MULTIVITAMIN W/MINERALS CH
1.0000 | ORAL_TABLET | Freq: Every day | ORAL | Status: DC
  Administered 2023-04-23 – 2023-04-25 (×3): 1 via ORAL
  Filled 2023-04-23 (×3): qty 1

## 2023-04-23 MED ORDER — DIAZEPAM 5 MG/ML IJ SOLN
10.0000 mg | Freq: Once | INTRAMUSCULAR | Status: AC
  Administered 2023-04-23: 10 mg via INTRAVENOUS
  Filled 2023-04-23: qty 2

## 2023-04-23 MED ORDER — LORAZEPAM 1 MG PO TABS: 1.0000 mg | ORAL_TABLET | ORAL | Status: DC | PRN

## 2023-04-23 MED ORDER — SODIUM CHLORIDE 0.9 % IV BOLUS
1000.0000 mL | Freq: Once | INTRAVENOUS | Status: AC
  Administered 2023-04-23: 1000 mL via INTRAVENOUS

## 2023-04-23 MED ORDER — THIAMINE HCL 100 MG/ML IJ SOLN: 100.0000 mg | Freq: Every day | INTRAMUSCULAR | Status: DC

## 2023-04-23 MED ORDER — AMLODIPINE BESYLATE 5 MG PO TABS
2.5000 mg | ORAL_TABLET | Freq: Every day | ORAL | Status: DC
  Administered 2023-04-23 – 2023-04-25 (×3): 2.5 mg via ORAL
  Filled 2023-04-23 (×3): qty 1

## 2023-04-23 MED ORDER — THIAMINE MONONITRATE 100 MG PO TABS
100.0000 mg | ORAL_TABLET | Freq: Every day | ORAL | Status: DC
  Administered 2023-04-23 – 2023-04-25 (×3): 100 mg via ORAL
  Filled 2023-04-23 (×3): qty 1

## 2023-04-23 MED ORDER — ONDANSETRON HCL 4 MG/2ML IJ SOLN: 4.0000 mg | Freq: Four times a day (QID) | INTRAMUSCULAR | Status: DC | PRN

## 2023-04-23 NOTE — H&P (Addendum)
History and Physical    Patient: Lance Green DOB: 07/08/1976 DOA: 04/23/2023 DOS: the patient was seen and examined on 04/23/2023 [facility]: [facility]  Patient coming from:  church   Chief Complaint:  Chief Complaint  Patient presents with   Seizures    (Had x1 witness seizure, fell and hit head)   HPI: Lance Green is a 47 y.o. male with medical history significant of alcohol abuse, tobacco use, hypertension presenting with alcohol withdrawal, seizure.  Patient noted Benadryl treatment earlier today with a witnessed seizure.  Per report, patient has had seizure last approximate 2 to 3 minutes.  Patient does report a prior history of seizure disorder associated with alcohol use.  Is not on any antiepileptic medication.  Patient regular drinks at least two +40 ounce daily.  Denies any heavy liquor use.  Has not had a drink in approximately 1 to 2 days.  No chest pain or shortness of breath.  No nausea or vomiting.  No hemiparesis or confusion.  Minimal abdominal pain. Presented to the ER afebrile, hemodynamically stable.  White count 4.5, hemoglobin 11, platelets 113, creatinine 0.86, AST 107, ALT 50, alk phos 138, T. bili 2.1. Review of Systems: As mentioned in the history of present illness. All other systems reviewed and are negative. Past Medical History:  Diagnosis Date   Alcohol abuse    HTN (hypertension)    Neuropathy    Pancreatitis    Peptic ulcer disease    Past Surgical History:  Procedure Laterality Date   ANTERIOR CERVICAL DECOMP/DISCECTOMY FUSION N/A 04/02/2022   Procedure: C3-6 ANTERIOR CERVICAL DECOMPRESSION/DISCECTOMY FUSION 3 LEVELS;  Surgeon: Venetia Night, MD;  Location: [facility] ORS;  Service: Neurosurgery;  Laterality: N/A;   APPENDECTOMY     INCISION AND DRAINAGE Left 11/11/2018   Procedure: INCISION AND DRAINAGE;  Surgeon: Donato Heinz, MD;  Location: [facility] ORS;  Service: Orthopedics;  Laterality: Left;   LAPAROSCOPIC  APPENDECTOMY N/A 07/06/2018   Procedure: APPENDECTOMY LAPAROSCOPIC;  Surgeon: Carolan Shiver, MD;  Location: [facility] ORS;  Service: General;  Laterality: N/A;   Social History:  reports that he has been smoking cigarettes. He has never used smokeless tobacco. He reports current alcohol use. He reports that he does not use drugs.  No Known Allergies  Family History  Problem Relation Age of Onset   Cataracts Mother     Prior to Admission medications   Medication Sig Start Date End Date Taking? Authorizing Provider  albuterol (VENTOLIN HFA) 108 (90 Base) MCG/ACT inhaler Inhale 2 puffs into the lungs every 4 (four) hours as needed for wheezing or shortness of breath. Patient not taking: Reported on 11/14/2022 10/10/22   Kem Boroughs B, FNP  azithromycin (ZITHROMAX) 250 MG tablet 1 tablet for the next 4 days. Patient not taking: Reported on 11/14/2022 10/10/22   Kem Boroughs B, FNP  benzonatate (TESSALON PERLES) 100 MG capsule Take 1 capsule (100 mg total) by mouth 3 (three) times daily as needed for cough. Patient not taking: Reported on 11/14/2022 10/20/22 10/20/23  Cuthriell, Delorise Royals, PA-C  gabapentin (NEURONTIN) 100 MG capsule Take 1 capsule (100 mg total) by mouth 3 (three) times daily. 03/12/23 03/11/24  Delton Prairie, MD  predniSONE (DELTASONE) 50 MG tablet Take 1 tablet (50 mg total) by mouth daily with breakfast. Patient not taking: Reported on 11/14/2022 10/20/22   Racheal Patches, PA-C    Physical Exam: Vitals:   04/23/23 1005 04/23/23 1007 04/23/23 1030 04/23/23 1100  BP:    (!) 167/108  Pulse:   76 81  Resp:   15 14  Temp: 98.4 F (36.9 C)     TempSrc: Oral     SpO2:   100% 97%  Weight:  65.8 kg    Height:  5\' 6"  (1.676 m)     Physical Exam Constitutional:      Appearance: He is normal weight.  HENT:     Head: Normocephalic and atraumatic.     Nose: Nose normal.  Eyes:     Pupils: Pupils are equal, round, and reactive to light.  Cardiovascular:     Rate and Rhythm:  Normal rate and regular rhythm.  Pulmonary:     Effort: Pulmonary effort is normal.  Abdominal:     General: Bowel sounds are normal.  Musculoskeletal:        General: Normal range of motion.  Skin:    General: Skin is warm.  Neurological:     General: No focal deficit present.  Psychiatric:        Mood and Affect: Mood normal.     Data Reviewed:  There are no new results to review at this time. Lab Results  Component Value Date   WBC 4.5 04/23/2023   HGB 11.0 (L) 04/23/2023   HCT 30.4 (L) 04/23/2023   MCV 90.7 04/23/2023   PLT 113 (L) 04/23/2023   Last metabolic panel Lab Results  Component Value Date   GLUCOSE 186 (H) 04/23/2023   NA 134 (L) 04/23/2023   K 3.4 (L) 04/23/2023   CL 98 04/23/2023   CO2 25 04/23/2023   BUN 7 04/23/2023   CREATININE 0.86 04/23/2023   GFRNONAA >60 04/23/2023   CALCIUM 8.9 04/23/2023   PHOS 3.7 11/27/2019   PROT 7.7 04/23/2023   ALBUMIN 4.2 04/23/2023   BILITOT 2.1 (H) 04/23/2023   ALKPHOS 138 (H) 04/23/2023   AST 107 (H) 04/23/2023   ALT 50 (H) 04/23/2023   ANIONGAP 11 04/23/2023    Assessment and Plan: * ETOH abuse Regular ETOH use around 2+40 ounce beers daily ETOH level pending  Actively trying to quit per patient  Start CIWA protocol  CSW consult    Seizure (HCC) Alcohol withdrawal-related seizure event Patient reports prior episodes associated with alcohol withdrawal in the past As needed IV Ativan CT head pending Monitor Neuro consult as clinically indicated    Tobacco abuse counseling 1/2 pack/day smoker Discussed cessation Start nicotine patch  Transaminitis AST 107, ALT 50 in the setting of alcohol use Trend for now  Hepatitis panel and tylenol level pending        Advance Care Planning:   Code Status: Full Code   Consults: None   Family Communication: Family at the bedside   Severity of Illness: The appropriate patient status for this patient is OBSERVATION. Observation status is judged  to be reasonable and necessary in order to provide the required intensity of service to ensure the patient's safety. The patient's presenting symptoms, physical exam findings, and initial radiographic and laboratory data in the context of their medical condition is felt to place them at decreased risk for further clinical deterioration. Furthermore, it is anticipated that the patient will be medically stable for discharge from the hospital within 2 midnights of admission.   Author: Floydene Flock, MD 04/23/2023 1:21 PM  For on call review [email or web address].

## 2023-04-23 NOTE — ED Triage Notes (Signed)
pt in via [facility] from a church event for a witnessed seizure. Per EMS pt fell and hit head and seizure lasted 2-3 mins. Hx of seizures from alcohol withdrawal. Does not take medication for seizures. Hx of drinking every day. Has not had a drink in 3 days. A&O x4.  Vitals Per EMS: O2- 100% HR- 89 BP-154/102 BS-241

## 2023-04-23 NOTE — Plan of Care (Signed)
  Problem: Education: Goal: Knowledge of General Education information will improve Description: Including pain rating scale, medication(s)/side effects and non-pharmacologic comfort measures Outcome: Progressing   Problem: Safety: Goal: Ability to remain free from injury will improve Outcome: Progressing   Problem: Skin Integrity: Goal: Risk for impaired skin integrity will decrease Outcome: Progressing   

## 2023-04-23 NOTE — Plan of Care (Signed)

## 2023-04-23 NOTE — Assessment & Plan Note (Addendum)
Alcohol withdrawal protocol

## 2023-04-23 NOTE — Assessment & Plan Note (Addendum)
AST 73 and ALT now normal range, total bilirubin now normal range at 1.2.  Acute hepatitis profile and HIV test negative.  Will get an ultrasound of the liver to screen for cirrhosis.

## 2023-04-23 NOTE — Assessment & Plan Note (Addendum)
-  Nicotine patch 

## 2023-04-23 NOTE — Congregational Nurse Program (Signed)
  Dept: [phone]   Congregational Nurse Program Note  Date of Encounter: 04/23/2023 Late Entry from AM encounter: Client was at [facility] day center, Rn was called to come immediately to the front hall, client seen lying on the floor on his right side, actively seizing. Seizure lasted aprox 2-3 minutes. EMS was called during the seizure. Client's significant other was present and stated that client had a seizure "about 3 years ago", was not currently taking any antiseizure medication, she was uncertain as to whether he was prescribed any. She denied any drug use prior to his visit to the center this morning. She stated he was not a diabetic, and had no drug allergies. EMS arrived, client remained on his side, he did awaken and was disoriented, postictal. He did become more alert and agreed to be transported to the [facility] emergency room for evaluation. Francesco Runner BSN, RN Past Medical History: Past Medical History:  Diagnosis Date   Alcohol abuse    HTN (hypertension)    Neuropathy    Pancreatitis    Peptic ulcer disease     Encounter Details:  CNP Questionnaire - 04/23/23 1305       Questionnaire   Ask client: Do you give verbal consent for me to treat you today? Yes    Student Assistance N/A    Location Patient Served  [facility]    Visit Setting with Client Organization    Patient Status Unknown   client reports he is not homeless   Insurance Medicaid   [facility]   Insurance/Financial Assistance Referral N/A    Medication N/A   unclear at this time   Medical Provider Yes   [facility] states [facility]   Screening Referrals Made N/A    Medical Referrals Made N/A   Discussed application for [facility], client did not want to stay long enough to complete, but stated he would RTC next week   Medical Appointment Made N/A    Recently w/o PCP, now 1st time PCP visit completed due to CNs referral or appointment made N/A    Food N/A    Transportation N/A   Uses [place] [employer] bus  system   Housing/Utilities N/A   clientt mentioned living with his mother   Interpersonal Safety N/A    Interventions Advocate/Support    Abnormal to Normal Screening Since Last CN Visit N/A    Screenings CN Performed N/A    Sent Client to Lab for: N/A    Did client attend any of the following based off CNs referral or appointments made? N/A    ED Visit Averted N/A    Life-Saving Intervention Made N/A

## 2023-04-23 NOTE — Plan of Care (Signed)
  Problem: Education: Goal: Knowledge of General Education information will improve Description: Including pain rating scale, medication(s)/side effects and non-pharmacologic comfort measures Outcome: Progressing   Problem: Health Behavior/Discharge Planning: Goal: Ability to manage health-related needs will improve Outcome: Progressing   Problem: Clinical Measurements: Goal: Ability to maintain clinical measurements within normal limits will improve Outcome: Progressing   Problem: Nutrition: Goal: Adequate nutrition will be maintained Outcome: Progressing   Problem: Coping: Goal: Level of anxiety will decrease Outcome: Progressing   Problem: Pain Managment: Goal: General experience of comfort will improve Outcome: Progressing   Problem: Safety: Goal: Ability to remain free from injury will improve Outcome: Progressing   

## 2023-04-23 NOTE — ED Provider Notes (Signed)
[facility] Provider Note    Event Date/Time   First MD Initiated Contact with Patient 04/23/23 1010     (approximate)   History   Chief Complaint: Seizures ((Had x1 witness seizure, fell and hit head))   HPI  Lance Green is a 47 y.o. male with a history of alcohol dependence, hypertension who comes the ED due to a witnessed seizure while in church.  Reportedly it lasted about 3 minutes and then stopped.  Patient reports feeling very fatigued.  Reports that he is a daily drinker but has not had alcohol in 3 days due to lack of access.  Denies pain or fever.     Physical Exam   Triage Vital Signs: ED Triage Vitals  Encounter Vitals Group     BP 04/23/23 1100 (!) 167/108     Systolic BP Percentile --      Diastolic BP Percentile --      Pulse Rate 04/23/23 1030 76     Resp 04/23/23 1030 15     Temp 04/23/23 1005 98.4 F (36.9 C)     Temp Source 04/23/23 1005 Oral     SpO2 04/23/23 1030 100 %     Weight 04/23/23 1007 145 lb (65.8 kg)     Height 04/23/23 1007 5\' 6"  (1.676 m)     Head Circumference --      Peak Flow --      Pain Score 04/23/23 1007 0     Pain Loc --      Pain Education --      Exclude from Growth Chart --     Most recent vital signs: Vitals:   04/23/23 1030 04/23/23 1100  BP:  (!) 167/108  Pulse: 76 81  Resp: 15 14  Temp:    SpO2: 100% 97%    General: Awake, no distress.  CV:  Good peripheral perfusion.  Regular rate and rhythm Resp:  Normal effort.  Clear to auscultation bilaterally Abd:  No distention.  Soft nontender Other:  Moist oral mucosa.  No lacerations or deformities.   ED Results / Procedures / Treatments   Labs (all labs ordered are listed, but only abnormal results are displayed) Labs Reviewed  COMPREHENSIVE METABOLIC PANEL - Abnormal; Notable for the following components:      Result Value   Sodium 134 (*)    Potassium 3.4 (*)    Glucose, Bld 186 (*)    AST 107 (*)    ALT 50 (*)    Alkaline  Phosphatase 138 (*)    Total Bilirubin 2.1 (*)    All other components within normal limits  SALICYLATE LEVEL - Abnormal; Notable for the following components:   Salicylate Lvl <7.0 (*)    All other components within normal limits  CBC WITH DIFFERENTIAL/PLATELET - Abnormal; Notable for the following components:   RBC 3.35 (*)    Hemoglobin 11.0 (*)    HCT 30.4 (*)    MCHC 36.2 (*)    Platelets 113 (*)    All other components within normal limits  LIPASE, BLOOD     EKG Interpreted by me Sinus rhythm rate of 87.  Normal axis, normal intervals.  Normal QRS ST segments and T waves.   RADIOLOGY CT head interpreted by me, negative for intracranial hemorrhage.  Radiology report reviewed.   PROCEDURES:  Procedures   MEDICATIONS ORDERED IN ED: Medications  sodium chloride 0.9 % bolus 1,000 mL (1,000 mLs Intravenous New Bag/Given 04/23/23  1038)  ondansetron (ZOFRAN) injection 4 mg (4 mg Intravenous Given 04/23/23 1040)  diazepam (VALIUM) injection 10 mg (10 mg Intravenous Given 04/23/23 1040)     IMPRESSION / MDM / ASSESSMENT AND PLAN / ED COURSE  I reviewed the triage vital signs and the nursing notes.  DDx: Alcohol withdrawal seizure, electrolyte abnormality, anemia, dehydration, AKI  Patient's presentation is most consistent with acute presentation with potential threat to life or bodily function.  Patient presents with witnessed seizure, does not have a history of epilepsy.  Last drink was 3 days ago, worrisome for alcohol withdrawal.  No significant autonomic dysfunction.  Patient given IV Valium and feeling better.  Discussed staying next steps, patient has poor access to resources, would not reliably be able to go to pharmacy and pick up medication and take it and he is agreeable for hospitalization for continued management of alcohol withdrawal syndrome.  Case discussed with hospitalist.       FINAL CLINICAL IMPRESSION(S) / ED DIAGNOSES   Final diagnoses:  Alcohol  withdrawal seizure without complication (HCC)     Rx / DC Orders   ED Discharge Orders     None        Note:  This document was prepared using Dragon voice recognition software and may include unintentional dictation errors.   Sharman Cheek, MD 04/23/23 [phone]

## 2023-04-24 DIAGNOSIS — Z716 Tobacco abuse counseling: Secondary | ICD-10-CM | POA: Diagnosis not present

## 2023-04-24 DIAGNOSIS — D696 Thrombocytopenia, unspecified: Secondary | ICD-10-CM

## 2023-04-24 DIAGNOSIS — I10 Essential (primary) hypertension: Secondary | ICD-10-CM | POA: Diagnosis not present

## 2023-04-24 DIAGNOSIS — F10930 Alcohol use, unspecified with withdrawal, uncomplicated: Secondary | ICD-10-CM

## 2023-04-24 DIAGNOSIS — E876 Hypokalemia: Secondary | ICD-10-CM | POA: Diagnosis not present

## 2023-04-24 DIAGNOSIS — R7401 Elevation of levels of liver transaminase levels: Secondary | ICD-10-CM | POA: Diagnosis not present

## 2023-04-24 DIAGNOSIS — F10939 Alcohol use, unspecified with withdrawal, unspecified: Secondary | ICD-10-CM

## 2023-04-24 DIAGNOSIS — R569 Unspecified convulsions: Secondary | ICD-10-CM | POA: Diagnosis not present

## 2023-04-24 DIAGNOSIS — G629 Polyneuropathy, unspecified: Secondary | ICD-10-CM | POA: Diagnosis not present

## 2023-04-24 DIAGNOSIS — F10232 Alcohol dependence with withdrawal with perceptual disturbance: Secondary | ICD-10-CM | POA: Diagnosis not present

## 2023-04-24 DIAGNOSIS — K760 Fatty (change of) liver, not elsewhere classified: Secondary | ICD-10-CM | POA: Diagnosis not present

## 2023-04-24 LAB — COMPREHENSIVE METABOLIC PANEL
ALT: 40 U/L (ref 0–44)
AST: 73 U/L — ABNORMAL HIGH (ref 15–41)
Albumin: 3.8 g/dL (ref 3.5–5.0)
Alkaline Phosphatase: 122 U/L (ref 38–126)
Anion gap: 12 (ref 5–15)
BUN: 6 mg/dL (ref 6–20)
CO2: 25 mmol/L (ref 22–32)
Calcium: 8.6 mg/dL — ABNORMAL LOW (ref 8.9–10.3)
Chloride: 102 mmol/L (ref 98–111)
Creatinine, Ser: 0.79 mg/dL (ref 0.61–1.24)
GFR, Estimated: >60 mL/min (ref >60)
Glucose, Bld: 120 mg/dL — ABNORMAL HIGH (ref 70–99)
Potassium: 3.3 mmol/L — ABNORMAL LOW (ref 3.5–5.1)
Sodium: 139 mmol/L (ref 135–145)
Total Bilirubin: 1.2 mg/dL (ref 0.3–1.2)
Total Protein: 7 g/dL (ref 6.5–8.1)

## 2023-04-24 LAB — HEPATITIS PANEL, ACUTE
HCV Ab: NONREACTIVE
Hep A IgM: NONREACTIVE
Hep B C IgM: NONREACTIVE
Hepatitis B Surface Ag: NONREACTIVE

## 2023-04-24 LAB — CBC
HCT: 34.3 % — ABNORMAL LOW (ref 39.0–52.0)
Hemoglobin: 12 g/dL — ABNORMAL LOW (ref 13.0–17.0)
MCH: 32.7 pg (ref 26.0–34.0)
MCHC: 35 g/dL (ref 30.0–36.0)
MCV: 93.5 fL (ref 80.0–100.0)
Platelets: 105 10*3/uL — ABNORMAL LOW (ref 150–400)
RBC: 3.67 MIL/uL — ABNORMAL LOW (ref 4.22–5.81)
RDW: 12.9 % (ref 11.5–15.5)
WBC: 6.9 10*3/uL (ref 4.0–10.5)
nRBC: 0 % (ref 0.0–0.2)

## 2023-04-24 LAB — HIV ANTIBODY (ROUTINE TESTING W REFLEX): HIV Screen 4th Generation wRfx: NONREACTIVE

## 2023-04-24 LAB — MAGNESIUM: Magnesium: 1.3 mg/dL — ABNORMAL LOW (ref 1.7–2.4)

## 2023-04-24 MED ORDER — MAGNESIUM SULFATE 4 GM/100ML IV SOLN
4.0000 g | Freq: Once | INTRAVENOUS | Status: AC
  Administered 2023-04-24: 4 g via INTRAVENOUS
  Filled 2023-04-24: qty 100

## 2023-04-24 MED ORDER — DEXTROSE-SODIUM CHLORIDE 5-0.9 % IV SOLN: INTRAVENOUS | Status: DC

## 2023-04-24 MED ORDER — POTASSIUM CHLORIDE CRYS ER 20 MEQ PO TBCR
40.0000 meq | EXTENDED_RELEASE_TABLET | Freq: Once | ORAL | Status: AC
  Administered 2023-04-24: 40 meq via ORAL

## 2023-04-24 MED ORDER — POTASSIUM CHLORIDE CRYS ER 20 MEQ PO TBCR
EXTENDED_RELEASE_TABLET | ORAL | Status: AC
  Filled 2023-04-24: qty 2

## 2023-04-24 NOTE — Assessment & Plan Note (Addendum)
Replaced. °

## 2023-04-24 NOTE — Assessment & Plan Note (Signed)
No further seizures.  Advised no driving.

## 2023-04-24 NOTE — Evaluation (Signed)
Physical Therapy Evaluation & Discharge Patient Details Name: Lance Green MRN: [id number] DOB: 1975-12-11 Today's Date: 04/24/2023  History of Present Illness  47 y/o male presented to ED on 04/23/23 from a church event for a witnessed seizure. Seizure suspected to be due to alcohol withdrawal. PMH: alcohol abuse, tobacco use, HTN  Clinical Impression  Patient admitted with the above. PTA, patient is independent and has been house hopping and attempting to find a place to live. Patient continues to function at independent level with no AD. Patient reports feeling at his baseline with no deficits noted. Mild balance deficits noted during ambulation 2/2 neuropathy. No further skilled PT needs identified acutely. PT will sign off.         Assistance Recommended at Discharge PRN  If plan is discharge home, recommend the following:  Can travel by private vehicle           Equipment Recommendations None recommended by PT  Recommendations for Other Services       Functional Status Assessment Patient has not had a recent decline in their functional status     Precautions / Restrictions Precautions Precautions: None Restrictions Weight Bearing Restrictions: No      Mobility  Bed Mobility Overal bed mobility: Modified Independent                  Transfers Overall transfer level: Modified independent Equipment used: None                    Ambulation/Gait Ambulation/Gait assistance: Modified independent (Device/Increase time) Gait Distance (Feet): 360 Feet Assistive device: None Gait Pattern/deviations: Step-through pattern, Decreased stride length Gait velocity: decreased     General Gait Details: mild balance deficits noted 2/2 neuropathy  Stairs            Wheelchair Mobility     Tilt Bed    Modified Rankin (Stroke Patients Only)       Balance Overall balance assessment: Mild deficits observed, not formally tested                                            Pertinent Vitals/Pain Pain Assessment Pain Assessment: No/denies pain    Home Living Family/patient expects to be discharged to:: Unsure                   Additional Comments: Patient reports hopping from home to home currently with fiance    Prior Function Prior Level of Function : Independent/Modified Independent                     Hand Dominance        Extremity/Trunk Assessment   Upper Extremity Assessment Upper Extremity Assessment: Overall WFL for tasks assessed    Lower Extremity Assessment Lower Extremity Assessment: Overall WFL for tasks assessed (hx of neuropathy per patient report)    Cervical / Trunk Assessment Cervical / Trunk Assessment: Normal  Communication   Communication: No difficulties  Cognition Arousal/Alertness: Awake/alert Behavior During Therapy: WFL for tasks assessed/performed Overall Cognitive Status: Within Functional Limits for tasks assessed                                          General Comments  Exercises     Assessment/Plan    PT Assessment Patient does not need any further PT services  PT Problem List         PT Treatment Interventions      PT Goals (Current goals can be found in the Care Plan section)  Acute Rehab PT Goals Patient Stated Goal: to find a place to live PT Goal Formulation: All assessment and education complete, DC therapy    Frequency       Co-evaluation               AM-PAC PT "6 Clicks" Mobility  Outcome Measure Help needed turning from your back to your side while in a flat bed without using bedrails?: None Help needed moving from lying on your back to sitting on the side of a flat bed without using bedrails?: None Help needed moving to and from a bed to a chair (including a wheelchair)?: None Help needed standing up from a chair using your arms (e.g., wheelchair or bedside chair)?: None Help needed to  walk in hospital room?: None Help needed climbing 3-5 steps with a railing? : None 6 Click Score: 24    End of Session   Activity Tolerance: Patient tolerated treatment well Patient left: in bed;with call bell/phone within reach Nurse Communication: Mobility status PT Visit Diagnosis: Unsteadiness on feet (R26.81)    Time: 1324-4010 PT Time Calculation (min) (ACUTE ONLY): 12 min   Charges:   PT Evaluation $PT Eval Low Complexity: 1 Low   PT General Charges $$ ACUTE PT VISIT: 1 Visit         Maylon Peppers, PT, DPT Physical Therapist - [facility]  [facility]   Terek Bee A Elexia Friedt 04/24/2023, 3:13 PM

## 2023-04-24 NOTE — Assessment & Plan Note (Signed)
Replaced magnesium IV this morning.  Will give oral supplementation upon going home.

## 2023-04-24 NOTE — TOC Progression Note (Signed)
Transition of Care [facility]) - Progression Note    Patient Details  Name: SHEAMUS HASTING MRN: [id number] Date of Birth: 02/26/1976  [facility]) CM/SW Contact  Marlowe Sax, RN Phone Number: 04/24/2023, 9:26 AM  Clinical Narrative:     Transition of Care [facility]) - Inpatient Brief Assessment   Patient Details  Name: MCKENNA BORUFF MRN: [id number] Date of Birth: Sep 12, 1976  Transition of Care [facility]) CM/SW Contact:    Marlowe Sax, RN Phone Number: 04/24/2023, 9:26 AM   Clinical Narrative:   Patient reported daily ETOH use, and on SDOH screening reported issues with housing,  Placed Substance Abuse Resources and housing resources on AVS to print at time of DC   Transition of Care Asessment: Insurance and Status: Insurance coverage has been reviewed Patient has primary care physician: Yes ([facility]) Home environment has been reviewed: yes Prior level of function:: Independent Prior/Current Home Services: No current home services Social Determinants of Health Reivew: SDOH reviewed interventions complete Readmission risk has been reviewed: Yes Transition of care needs: no transition of care needs at this time        Expected Discharge Plan and Services                                               Social Determinants of Health (SDOH) Interventions SDOH Screenings   Food Insecurity: No Food Insecurity (04/23/2023)  Housing: High Risk (04/23/2023)  Transportation Needs: No Transportation Needs (04/23/2023)  Utilities: Patient Declined (04/23/2023)  Tobacco Use: High Risk (04/23/2023)    Readmission Risk Interventions     No data to display

## 2023-04-24 NOTE — Plan of Care (Signed)

## 2023-04-24 NOTE — Assessment & Plan Note (Signed)
Secondary to alcohol abuse 

## 2023-04-24 NOTE — Hospital Course (Signed)
47 y.o. male with medical history significant of alcohol abuse, tobacco use, hypertension presenting with alcohol withdrawal, seizure.  Patient noted Benadryl treatment earlier today with a witnessed seizure.  Per report, patient has had seizure last approximate 2 to 3 minutes.  Patient does report a prior history of seizure disorder associated with alcohol use.  Is not on any antiepileptic medication.  Patient regular drinks at least two +40 ounce daily.  Denies any heavy liquor use.  Has not had a drink in approximately 1 to 2 days.  No chest pain or shortness of breath.  No nausea or vomiting.  No hemiparesis or confusion.  Minimal abdominal pain. Presented to the ER afebrile, hemodynamically stable.  White count 4.5, hemoglobin 11, platelets 113, creatinine 0.86, AST 107, ALT 50, alk phos 138, T. bili 2.1.  7/19.  Patient interested in stopping drinking.  Continue alcohol withdrawal protocol.  Replace IV magnesium for hypomagnesemia and oral potassium for hypokalemia. 7/20.  Patient interested in stopping drinking.  Magnesium supplemented again.  Ultrasound of the liver showing hepatic steatosis.  No further seizures.  No tremors currently.  Will prescribe naltrexone and Ativan taper upon disposition.

## 2023-04-24 NOTE — Progress Notes (Signed)
Progress Note   Patient: Lance Green WJX:[id number] DOB: [birth year] DOA: 04/23/2023     1 DOS: the patient was seen and examined on 04/24/2023   Brief hospital course: 47 y.o. male with medical history significant of alcohol abuse, tobacco use, hypertension presenting with alcohol withdrawal, seizure.  Patient noted Benadryl treatment earlier today with a witnessed seizure.  Per report, patient has had seizure last approximate 2 to 3 minutes.  Patient does report a prior history of seizure disorder associated with alcohol use.  Is not on any antiepileptic medication.  Patient regular drinks at least two +40 ounce daily.  Denies any heavy liquor use.  Has not had a drink in approximately 1 to 2 days.  No chest pain or shortness of breath.  No nausea or vomiting.  No hemiparesis or confusion.  Minimal abdominal pain. Presented to the ER afebrile, hemodynamically stable.  White count 4.5, hemoglobin 11, platelets 113, creatinine 0.86, AST 107, ALT 50, alk phos 138, T. bili 2.1.  7/19.  Patient interested in stopping drinking.  Continue alcohol withdrawal protocol.  Replace IV magnesium for hypomagnesemia and oral potassium for hypokalemia.  Assessment and Plan: * Seizure due to alcohol withdrawal (HCC) Continue to monitor.  Advise no driving.  ETOH abuse Alcohol withdrawal protocol    Hypomagnesemia 4 g IV magnesium today  Hypokalemia Replace oral potassium  Transaminitis AST 73 and ALT now normal range, total bilirubin now normal range at 1.2.  Acute hepatitis profile and HIV test negative.  Will get an ultrasound of the liver to screen for cirrhosis.  Thrombocytopenia (HCC) Secondary to alcohol abuse  Tobacco abuse counseling Nicotine patch        Subjective: Patient does not recall what happened.  Neysa Bonito states he had a seizure and lost his urine.  Patient feels okay now better than when he came in.  Physical Exam: Vitals:   04/23/23 1531 04/23/23 1725 04/23/23 2340  04/24/23 1003  BP: (!) 147/103 (!) 151/97 (!) 154/92 136/77  Pulse: 80 86 86 87  Resp: 18  17 18   Temp:   98.1 F (36.7 C) 99.2 F (37.3 C)  TempSrc:    Oral  SpO2: 100% 100% 100% 100%  Weight:      Height:       Physical Exam HENT:     Head: Normocephalic.     Mouth/Throat:     Pharynx: No oropharyngeal exudate.  Eyes:     General: Lids are normal.     Conjunctiva/sclera: Conjunctivae normal.  Cardiovascular:     Rate and Rhythm: Normal rate and regular rhythm.     Heart sounds: Normal heart sounds, S1 normal and S2 normal.  Pulmonary:     Breath sounds: Normal breath sounds. No decreased breath sounds, wheezing, rhonchi or rales.  Abdominal:     Palpations: Abdomen is soft.     Tenderness: There is no abdominal tenderness.  Musculoskeletal:     Right lower leg: No swelling.     Left lower leg: No swelling.  Skin:    General: Skin is warm.     Findings: No rash.  Neurological:     Mental Status: He is alert and oriented to person, place, and time.     Data Reviewed: White blood cell count 6.9 hemoglobin 12.0 and platelet count 105 105, potassium 3.3, magnesium 1.3, AST 73.  Family Communication: Fianc at bedside  Disposition: Status is: Inpatient Remains inpatient appropriate because: Patient is restrained going through withdrawal protocol while  here.  Replace IV magnesium today.  Planned Discharge Destination: Home    Time spent: 28 minutes  Author: Alford Highland, MD 04/24/2023 2:50 PM  For on call review [email or web address].

## 2023-04-24 NOTE — Discharge Instructions (Signed)
No Driving   Intensive Outpatient Programs   Pensions consultant Health Services [facility] [address] [place],  [place], [place] [phone]  [facility] (Inpatient and outpatient)[phone] (Suboxone and Methadone) [address] [phone]  ADS: Alcohol & Drug [facility] Programs - Intensive Outpatient [address] [place], [place] [address], [place]  [phone]  [facility] (Outpatient, Inpatient, [facility] (Groups and Residental) (insurance only) [phone] [place], [place] [phone]   [facility] (for caregivers and family) [address] Laurell Josephs [address], [place], [place] [phone]  Residential Treatment Programs  [facility](2 years) Residential: 2 days)[facility] [address] [place], [place], [place] [phone] or [phone]  [facility] [address] [place], [place], [place] [phone]  [facility] ([facility]) [address] [place], [place], [place] [phone] Admissions: 8am-3pm M-F  [facility] Program: Residential Program [phone] Days)             [facility]: [facility]  [place], [place], [place]  [phone] or [phone] in Hours over the weekend or by referral)  [facility] [address] [place], [place] [address] [phone] (Do virtual or phone assessment, offer transportation within 25 miles, have in patient and Outpatient options)   Mobil Crisis: [facility]:[phone]  (for crisis response 24 hours a day)      Rent/Utility/Housing  Agency Name: [facility] Address: [address] [place], [place] [address] Phone: [phone] Email: [email or web address] Website: www.alamanceservices.org Service(s) Offered: Housing services, self-sufficiency, congregate meal program, weatherization program, Field seismologist program, emergency food assistance,  housing counseling, home ownership program, wheels -towork program.  Agency Name: [facility] Address: [address], [place], [place] [address] Phone: [phone] (8a-4p) [phone] (8p- 10p) Email: [email or web address] Website: [email or web address].[email or web address].org Service(s) Offered: A program for homeless and/or needy men that includes one-on-one counseling, life skills training and job rehabilitation.  Agency Name: [employer] [facility] [place] Address: [address] [place], [place] [address] Phone: [phone] Website: www.alliedchurches.org Service(s) Offered: Assistance to needy in emergency with utility bills, heating fuel, and prescriptions. Shelter for homeless 7pm-7am. January 29, 2017 15  Agency Name: [facility] [place] (Developmentally Disabled) Address: [address], [place], [place] [address] Phone: [phone] Contact Person: Cathleen Corti Email: [email or web address] Website: [email or web address].[email or web address] Service(s) Offered: Helps individuals with developmental disabilities move from housing that is more restrictive to homes where they  can achieve greater independence and have more  opportunities.  Agency Name: [facility] Address: [address], [place], [place] [address] Phone: [phone] Email: [email or web address] Website: www.[email or web address].org Service(s) Offered: Provides affordable housing for low-income families, elderly, and disabled individuals. Offer a wide range of  programs  and services, from financial planning to afterschool and summer programs.  Agency Name: [facility] of [facility] Services Address: [address] [place], [place] [address] Phone: [phone] Service(s) Offered: Child support services; child welfare services; food stamps; Medicaid; work first family assistance; and aid with fuel,  rent, food and medicine.  Agency Name: [facility] [place] [employer]. Address: [facility] [address] [place], [place]  [address] Phone: [phone] Website: www.familyabuseservices.org Service(s) Offered: 24 hour Crisis Line: [phone]; 24 hour Emergency Shelter; Transitional Housing; Support Groups; Scientist, physiological; [employer]; Hispanic  Outreach: [phone];  Visitation Center: [phone].  Agency Name: [facility] [place], [place]. Address: [address] [place], [place] [address] Phone: [phone] Service(s) Offered: CAP Services; Home and [employer]; Individual or Group Supports; Respite Care Non-Institutional Nursing;  Residential Supports; Respite Care and Personal Care Services; Transportation; Family and Friends Night; Recreational Activities; Three Nutritious Meals/Snacks; Consultation with Registered Dietician; Twenty-four hour Registered Nurse Access; Daily and [employer]; Camp Green Leaves; Bryant for the [employer] (During Summer Months) Bingo Night (Every  Wednesday Night); Special Populations Dance Night  (Every Tuesday Night); Professional Hair Care Services.  Agency Name: [facility] Address: [address], [place], [place] [address] Phone: [phone] Contact Person: Jabier Mutton Website: [email or web address].Physicist, medical) Offered: Residential treatment facility for women; food and  clothing, educational & employment development and  transportation to work; Counsellor of financial skills;  parenting and family  reunification; emotional and spiritual  support; transitional housing for program graduates.  Agency Name: [facility] Address: [address] [place], [place] [address] Phone: [phone] Email: [email or web address] Website: TaskTown.es Service(s) Offered: Public housing units for elderly, disabled, and low income people; housing choice vouchers for income eligible  applicants; shelter plus care vouchers; and Psychologist, clinical.  Agency Name: [facility] [facility] [facility] [employer] Address: [address], [place], [place] [address] Phone: [phone] Email: habitat1@[email or web address] .net Website: www.habitatalamance.org Service(s) Offered: Build houses for families in need of decent housing. Each adult in the family must invest 200 hours of labor on  someone else's house, work with volunteers to build their own house, attend classes on budgeting, home maintenance, yard care, and attend homeowner association meetings.  Agency Name: [facility] Address: [address] [place], [place] [address] Phone: [phone] Website: www.rsli.org Service(s) Offered: Intermediate care facilities for intellectually delayed, Supervised Living in group homes for adults with developmental disabilities, Supervised Living for people who have dual diagnoses (MRMI), Independent Living, Supported Living, respite and a variety of CAP services, pre-vocational services, day supports, and [employer].  Agency Name: [place] [employer] Phone: [phone] Website: www.NCForeclosurePrevention.gov Service(s) Offered: Zero-interest, deferred loans to homeowners struggling to pay their mortgage. Call for more information.

## 2023-04-25 ENCOUNTER — Inpatient Hospital Stay: Payer: 59

## 2023-04-25 DIAGNOSIS — F10930 Alcohol use, unspecified with withdrawal, uncomplicated: Secondary | ICD-10-CM | POA: Diagnosis not present

## 2023-04-25 DIAGNOSIS — K760 Fatty (change of) liver, not elsewhere classified: Secondary | ICD-10-CM | POA: Diagnosis not present

## 2023-04-25 DIAGNOSIS — R7401 Elevation of levels of liver transaminase levels: Secondary | ICD-10-CM | POA: Diagnosis not present

## 2023-04-25 DIAGNOSIS — I10 Essential (primary) hypertension: Secondary | ICD-10-CM

## 2023-04-25 DIAGNOSIS — G629 Polyneuropathy, unspecified: Secondary | ICD-10-CM | POA: Diagnosis not present

## 2023-04-25 DIAGNOSIS — Z716 Tobacco abuse counseling: Secondary | ICD-10-CM | POA: Diagnosis not present

## 2023-04-25 DIAGNOSIS — E876 Hypokalemia: Secondary | ICD-10-CM | POA: Diagnosis not present

## 2023-04-25 DIAGNOSIS — R7301 Impaired fasting glucose: Secondary | ICD-10-CM

## 2023-04-25 DIAGNOSIS — F10232 Alcohol dependence with withdrawal with perceptual disturbance: Secondary | ICD-10-CM | POA: Diagnosis not present

## 2023-04-25 DIAGNOSIS — R569 Unspecified convulsions: Secondary | ICD-10-CM | POA: Diagnosis not present

## 2023-04-25 DIAGNOSIS — D696 Thrombocytopenia, unspecified: Secondary | ICD-10-CM | POA: Diagnosis not present

## 2023-04-25 LAB — BASIC METABOLIC PANEL
Anion gap: 7 (ref 5–15)
BUN: 7 mg/dL (ref 6–20)
CO2: 24 mmol/L (ref 22–32)
Calcium: 8.8 mg/dL — ABNORMAL LOW (ref 8.9–10.3)
Chloride: 106 mmol/L (ref 98–111)
Creatinine, Ser: 0.73 mg/dL (ref 0.61–1.24)
GFR, Estimated: >60 mL/min (ref >60)
Glucose, Bld: 118 mg/dL — ABNORMAL HIGH (ref 70–99)
Potassium: 3.7 mmol/L (ref 3.5–5.1)
Sodium: 137 mmol/L (ref 135–145)

## 2023-04-25 LAB — MAGNESIUM: Magnesium: 1.6 mg/dL — ABNORMAL LOW (ref 1.7–2.4)

## 2023-04-25 LAB — PHOSPHORUS: Phosphorus: 3.7 mg/dL (ref 2.5–4.6)

## 2023-04-25 MED ORDER — AMLODIPINE BESYLATE 2.5 MG PO TABS: 2.5000 mg | ORAL_TABLET | Freq: Every day | ORAL | 0 refills | Status: DC

## 2023-04-25 MED ORDER — FOLIC ACID 1 MG PO TABS: 1.0000 mg | ORAL_TABLET | Freq: Every day | ORAL | 0 refills | Status: DC

## 2023-04-25 MED ORDER — MAGNESIUM OXIDE 250 MG PO TABS: 1.0000 | ORAL_TABLET | Freq: Every day | ORAL | 0 refills | Status: AC

## 2023-04-25 MED ORDER — NALTREXONE HCL 50 MG PO TABS: 50.0000 mg | ORAL_TABLET | Freq: Every day | ORAL | 0 refills | Status: AC

## 2023-04-25 MED ORDER — VITAMIN B-1 100 MG PO TABS: 100.0000 mg | ORAL_TABLET | Freq: Every day | ORAL | 0 refills | Status: DC

## 2023-04-25 MED ORDER — GABAPENTIN 100 MG PO CAPS: 100.0000 mg | ORAL_CAPSULE | Freq: Three times a day (TID) | ORAL | 0 refills | Status: DC

## 2023-04-25 MED ORDER — ADULT MULTIVITAMIN W/MINERALS CH: 1.0000 | ORAL_TABLET | Freq: Every day | ORAL | Status: DC

## 2023-04-25 MED ORDER — ALBUTEROL SULFATE HFA 108 (90 BASE) MCG/ACT IN AERS: 2.0000 | INHALATION_SPRAY | Freq: Four times a day (QID) | RESPIRATORY_TRACT | 0 refills | Status: DC | PRN

## 2023-04-25 MED ORDER — MAGNESIUM SULFATE 4 GM/100ML IV SOLN
4.0000 g | Freq: Once | INTRAVENOUS | Status: AC
  Administered 2023-04-25: 4 g via INTRAVENOUS
  Filled 2023-04-25: qty 100

## 2023-04-25 MED ORDER — NICOTINE 14 MG/24HR TD PT24: MEDICATED_PATCH | TRANSDERMAL | 0 refills | Status: DC

## 2023-04-25 MED ORDER — LORAZEPAM 0.5 MG PO TABS: ORAL_TABLET | ORAL | 0 refills | Status: DC

## 2023-04-25 NOTE — Assessment & Plan Note (Signed)
On low dose gabapentin.

## 2023-04-25 NOTE — Discharge Summary (Signed)
Physician Discharge Summary   Patient: Lance Green MRN: [id number] DOB: 1976/05/10  Admit date:     04/23/2023  Discharge date: 04/25/23  Discharge Physician: Alford Highland   PCP: [facility]   Recommendations at discharge:   Follow-up PCP 5 days  Discharge Diagnoses: Principal Problem:   Seizure due to alcohol withdrawal (HCC) Active Problems:   ETOH abuse   Hypomagnesemia   Hypokalemia   Transaminitis   Thrombocytopenia (HCC)   Tobacco abuse counseling   Essential hypertension   Neuropathy   Hepatic steatosis    Hospital Course: 47 y.o. male with medical history significant of alcohol abuse, tobacco use, hypertension presenting with alcohol withdrawal, seizure.  Patient noted Benadryl treatment earlier today with a witnessed seizure.  Per report, patient has had seizure last approximate 2 to 3 minutes.  Patient does report a prior history of seizure disorder associated with alcohol use.  Is not on any antiepileptic medication.  Patient regular drinks at least two +40 ounce daily.  Denies any heavy liquor use.  Has not had a drink in approximately 1 to 2 days.  No chest pain or shortness of breath.  No nausea or vomiting.  No hemiparesis or confusion.  Minimal abdominal pain. Presented to the ER afebrile, hemodynamically stable.  White count 4.5, hemoglobin 11, platelets 113, creatinine 0.86, AST 107, ALT 50, alk phos 138, T. bili 2.1.  7/19.  Patient interested in stopping drinking.  Continue alcohol withdrawal protocol.  Replace IV magnesium for hypomagnesemia and oral potassium for hypokalemia. 7/20.  Patient interested in stopping drinking.  Magnesium supplemented again.  Ultrasound of the liver showing hepatic steatosis.  No further seizures.  No tremors currently.  Will prescribe naltrexone and Ativan taper upon disposition.  Assessment and Plan: * Seizure due to alcohol withdrawal (HCC) No further seizures.  Advised no driving.  ETOH  abuse Alcohol withdrawal protocol while in the hospital.  I will give him Ativan taper upon going home.  No tremors noticed today.  Naltrexone daily prescribed.  Care manager gave resources for alcohol treatment programs.    Hypomagnesemia Replaced magnesium IV this morning.  Will give oral supplementation upon going home.  Hypokalemia Replaced  Transaminitis AST 73 and ALT now normal range, total bilirubin now normal range at 1.2.  Acute hepatitis profile and HIV test negative.  Ultrasound of liver showing hepatic steatosis.  Thrombocytopenia (HCC) Secondary to alcohol abuse  Tobacco abuse counseling Nicotine patch  Hepatic steatosis Seen on ultrasound of the liver.  Advised alcohol cessation.  Neuropathy On low-dose gabapentin  Essential hypertension Low-dose Norvasc         Consultants: None Procedures performed: None Disposition: Home Diet recommendation:  Cardiac diet DISCHARGE MEDICATION: Allergies as of 04/25/2023   No Known Allergies      Medication List     TAKE these medications    albuterol 108 (90 Base) MCG/ACT inhaler Commonly known as: VENTOLIN HFA Inhale 2 puffs into the lungs every 6 (six) hours as needed for wheezing or shortness of breath. What changed: when to take this   amLODipine 2.5 MG tablet Commonly known as: NORVASC Take 1 tablet (2.5 mg total) by mouth daily. Start taking on: April 26, 2023   folic acid 1 MG tablet Commonly known as: FOLVITE Take 1 tablet (1 mg total) by mouth daily. Start taking on: April 26, 2023   gabapentin 100 MG capsule Commonly known as: Neurontin Take 1 capsule (100 mg total) by mouth 3 (three)  times daily.   LORazepam 0.5 MG tablet Commonly known as: Ativan One tab po every eight hours for two days then one tab po twice a day for two days then one tab po daily for three days   Magnesium Oxide 250 MG Tabs Take 1 tablet (250 mg total) by mouth daily for 7 days.   multivitamin with minerals  Tabs tablet Take 1 tablet by mouth daily. Start taking on: April 26, 2023   naltrexone 50 MG tablet Commonly known as: DEPADE Take 1 tablet (50 mg total) by mouth daily.   nicotine 14 mg/24hr patch Commonly known as: NICODERM CQ - dosed in mg/24 hours One 14 mg patch chest wall daily (okay to substitute generic)   thiamine 100 MG tablet Commonly known as: Vitamin B-1 Take 1 tablet (100 mg total) by mouth daily. Start taking on: April 26, 2023        Follow-up Information     [facility] Follow up in 5 day(s).   Specialty: General Practice Contact information: [address] [place] [address] [phone]                Discharge Exam: Filed Weights   04/23/23 1007  Weight: 65.8 kg   Physical Exam HENT:     Head: Normocephalic.     Mouth/Throat:     Pharynx: No oropharyngeal exudate.  Eyes:     General: Lids are normal.     Conjunctiva/sclera: Conjunctivae normal.  Cardiovascular:     Rate and Rhythm: Normal rate and regular rhythm.     Heart sounds: Normal heart sounds, S1 normal and S2 normal.  Pulmonary:     Breath sounds: Normal breath sounds. No decreased breath sounds, wheezing, rhonchi or rales.  Abdominal:     Palpations: Abdomen is soft.     Tenderness: There is no abdominal tenderness.  Musculoskeletal:     Right lower leg: No swelling.     Left lower leg: No swelling.  Skin:    General: Skin is warm.     Findings: No rash.  Neurological:     Mental Status: He is alert and oriented to person, place, and time.      Condition at discharge: stable  The results of significant diagnostics from this hospitalization (including imaging, microbiology, ancillary and laboratory) are listed below for reference.   Imaging Studies: US Abdomen Limited RUQ (LIVER/GB)  Result Date: 04/25/2023 CLINICAL DATA:  Alcohol abuse. EXAM: ULTRASOUND ABDOMEN LIMITED RIGHT UPPER QUADRANT COMPARISON:  Abdominal  ultrasound 11/08/2008. FINDINGS: Gallbladder: No gallstones or wall thickening visualized. No sonographic Murphy sign noted by sonographer. Common bile duct: Diameter: 3 mm.  No evidence of intrahepatic biliary dilatation. Liver: The hepatic echogenicity appears mildly increased. No focal lesion or morphologic changes of cirrhosis identified. Portal vein is patent on color Doppler imaging with normal direction of blood flow towards the liver. Other: No evidence of ascites. IMPRESSION: Mildly increased hepatic echogenicity suggesting hepatic steatosis. Otherwise unremarkable exam. Electronically Signed   By: Carey Bullocks M.D.   On: 04/25/2023 11:42   CT HEAD WO CONTRAST ( )  Result Date: 04/23/2023 CLINICAL DATA:  Seizure, new-onset, no history of trauma EXAM: CT HEAD WITHOUT CONTRAST TECHNIQUE: Contiguous axial images were obtained from the base of the skull through the vertex without intravenous contrast. RADIATION DOSE REDUCTION: This exam was performed according to the departmental dose-optimization program which includes automated exposure control, adjustment of the mA and/or kV according to patient size  and/or use of iterative reconstruction technique. COMPARISON:  CT Head 11/13/22 FINDINGS: Brain: No evidence of acute infarction, hemorrhage, hydrocephalus, extra-axial collection or mass lesion/mass effect. Vascular: No hyperdense vessel or unexpected calcification. Skull: Normal. Negative for fracture or focal lesion. Sinuses/Orbits: No middle ear or mastoid effusion. Mucosal thickening right maxillary sinus. Orbits are unremarkable. Impacted cerumen in the right EAC. Other: None. IMPRESSION: No acute intracranial abnormality. Electronically Signed   By: Lorenza Cambridge M.D.   On: 04/23/2023 15:00    Microbiology: Results for orders placed or performed during the hospital encounter of 10/20/22  Resp panel by RT-PCR (RSV, Flu A&B, Covid) Anterior Nasal Swab     Status: None   Collection Time: 10/20/22   7:25 PM   Specimen: Anterior Nasal Swab  Result Value Ref Range Status   SARS Coronavirus 2 by RT PCR NEGATIVE NEGATIVE Final    Comment: (NOTE) SARS-CoV-2 target nucleic acids are NOT DETECTED.  The SARS-CoV-2 RNA is generally detectable in upper respiratory specimens during the acute phase of infection. The lowest concentration of SARS-CoV-2 viral copies this assay can detect is 138 copies/mL. A negative result does not preclude SARS-Cov-2 infection and should not be used as the sole basis for treatment or other patient management decisions. A negative result may occur with  improper specimen collection/handling, submission of specimen other than nasopharyngeal swab, presence of viral mutation(s) within the areas targeted by this assay, and inadequate number of viral copies(<138 copies/mL). A negative result must be combined with clinical observations, patient history, and epidemiological information. The expected result is Negative.  Fact Sheet for Patients:  BloggerCourse.com  Fact Sheet for Healthcare Providers:  SeriousBroker.it  This test is no t yet approved or cleared by the [place] FDA and  has been authorized for detection and/or diagnosis of SARS-CoV-2 by FDA under an Emergency Use Authorization (EUA). This EUA will remain  in effect (meaning this test can be used) for the duration of the COVID-19 declaration under Section 564(b)(1) of the Act, 21 U.S.C.section 360bbb-3(b)(1), unless the authorization is terminated  or revoked sooner.       Influenza A by PCR NEGATIVE NEGATIVE Final   Influenza B by PCR NEGATIVE NEGATIVE Final    Comment: (NOTE) The Xpert Xpress SARS-CoV-2/FLU/RSV plus assay is intended as an aid in the diagnosis of influenza from Nasopharyngeal swab specimens and should not be used as a sole basis for treatment. Nasal washings and aspirates are unacceptable for Xpert Xpress  SARS-CoV-2/FLU/RSV testing.  Fact Sheet for Patients: BloggerCourse.com  Fact Sheet for Healthcare Providers: SeriousBroker.it  This test is not yet approved or cleared by the [place] FDA and has been authorized for detection and/or diagnosis of SARS-CoV-2 by FDA under an Emergency Use Authorization (EUA). This EUA will remain in effect (meaning this test can be used) for the duration of the COVID-19 declaration under Section 564(b)(1) of the Act, 21 U.S.C. section 360bbb-3(b)(1), unless the authorization is terminated or revoked.     Resp Syncytial Virus by PCR NEGATIVE NEGATIVE Final    Comment: (NOTE) Fact Sheet for Patients: BloggerCourse.com  Fact Sheet for Healthcare Providers: SeriousBroker.it  This test is not yet approved or cleared by the [place] FDA and has been authorized for detection and/or diagnosis of SARS-CoV-2 by FDA under an Emergency Use Authorization (EUA). This EUA will remain in effect (meaning this test can be used) for the duration of the COVID-19 declaration under Section 564(b)(1) of the Act, 21 U.S.C. section 360bbb-3(b)(1), unless the  authorization is terminated or revoked.  Performed at [facility], [address] [place], [place] [address]     Labs: CBC: Recent Labs  Lab 04/23/23 1032 04/24/23 0825  WBC 4.5 6.9  NEUTROABS 3.5  --   HGB 11.0* 12.0*  HCT 30.4* 34.3*  MCV 90.7 93.5  PLT 113* 105*   Basic Metabolic Panel: Recent Labs  Lab 04/23/23 1032 04/24/23 0825 04/25/23 0456  NA 134* 139 137  K 3.4* 3.3* 3.7  CL 98 102 106  CO2 25 25 24   GLUCOSE 186* 120* 118*  BUN 7 6 7   CREATININE 0.86 0.79 0.73  CALCIUM 8.9 8.6* 8.8*  MG  --  1.3* 1.6*  PHOS  --   --  3.7   Liver Function Tests: Recent Labs  Lab 04/23/23 1032 04/24/23 0825  AST 107* 73*  ALT 50* 40  ALKPHOS 138* 122  BILITOT 2.1* 1.2   PROT 7.7 7.0  ALBUMIN 4.2 3.8     Discharge time spent: greater than 30 minutes.  Signed: Alford Highland, MD [facility] 04/25/2023

## 2023-04-25 NOTE — Assessment & Plan Note (Signed)
Low-dose Norvasc.

## 2023-04-25 NOTE — Plan of Care (Signed)

## 2023-04-25 NOTE — Assessment & Plan Note (Signed)
Seen on ultrasound of the liver.  Advised alcohol cessation.

## 2023-04-25 NOTE — Plan of Care (Signed)
  Problem: Education: Goal: Knowledge of General Education information will improve Description: Including pain rating scale, medication(s)/side effects and non-pharmacologic comfort measures Outcome: Progressing   Problem: Health Behavior/Discharge Planning: Goal: Ability to manage health-related needs will improve Outcome: Progressing   Problem: Clinical Measurements: Goal: Ability to maintain clinical measurements within normal limits will improve Outcome: Progressing   Problem: Activity: Goal: Risk for activity intolerance will decrease Outcome: Progressing   Problem: Nutrition: Goal: Adequate nutrition will be maintained Outcome: Progressing   Problem: Coping: Goal: Level of anxiety will decrease Outcome: Progressing   Problem: Safety: Goal: Ability to remain free from injury will improve Outcome: Progressing   

## 2023-04-25 NOTE — Progress Notes (Signed)
DISCHARGE NOTE:   Pt discharged with IV removed and instructions given. Pt had no further questions or concerns. Pt wheeled  down to medical mall entrance and walked by foot to near bus station at the back of the [facility] parking lot.

## 2023-05-17 ENCOUNTER — Emergency Department: Payer: 59

## 2023-05-17 ENCOUNTER — Emergency Department
Admission: EM | Admit: 2023-05-17 | Discharge: 2023-05-17 | Disposition: A | Discharge status: Home / Self Care | Payer: 59 | Attending: Emergency Medicine | Admitting: Emergency Medicine

## 2023-05-17 ENCOUNTER — Other Ambulatory Visit: Payer: Self-pay

## 2023-05-17 DIAGNOSIS — R569 Unspecified convulsions: Secondary | ICD-10-CM | POA: Diagnosis not present

## 2023-05-17 DIAGNOSIS — R0902 Hypoxemia: Secondary | ICD-10-CM | POA: Diagnosis not present

## 2023-05-17 DIAGNOSIS — R531 Weakness: Secondary | ICD-10-CM | POA: Diagnosis not present

## 2023-05-17 DIAGNOSIS — R0781 Pleurodynia: Secondary | ICD-10-CM | POA: Diagnosis not present

## 2023-05-17 DIAGNOSIS — Z743 Need for continuous supervision: Secondary | ICD-10-CM | POA: Diagnosis not present

## 2023-05-17 NOTE — ED Notes (Signed)
Pt given [place] sandwich tray, drink, and warm blanket.

## 2023-05-17 NOTE — Discharge Instructions (Signed)
Return to ER for new or worsening symptoms.

## 2023-05-17 NOTE — ED Provider Notes (Signed)
[facility] Provider Note    Event Date/Time   First MD Initiated Contact with Patient 05/17/23 1746     (approximate)   History   Rib Injury   HPI  Lance Green is a 47 y.o. male presenting to the emergency department for right-sided rib pain.  No recent trauma but does report regular alcohol use.  Denies fevers, chills, cough, congestion, chest pain.  No nausea, vomiting, abdominal pain.    Physical Exam   Triage Vital Signs: ED Triage Vitals  Encounter Vitals Group     BP 05/17/23 1627 110/84     Systolic BP Percentile --      Diastolic BP Percentile --      Pulse Rate 05/17/23 1627 77     Resp 05/17/23 1627 19     Temp 05/17/23 1627 98.1 F (36.7 C)     Temp Source 05/17/23 1627 Oral     SpO2 05/17/23 1627 97 %     Weight --      Height --      Head Circumference --      Peak Flow --      Pain Score 05/17/23 1629 8     Pain Loc --      Pain Education --      Exclude from Growth Chart --     Most recent vital signs: Vitals:   05/17/23 1627  BP: 110/84  Pulse: 77  Resp: 19  Temp: 98.1 F (36.7 C)  SpO2: 97%     General: Awake, interactive  CV:  Regular rate, good peripheral perfusion.  Resp:  Lungs clear, unlabored respirations.  Chest wall: Tenderness to palpation along the right lateral chest wall without palpable crepitus or deformity Abd:  Soft, nondistended.  Neuro:  Symmetric facial movement, fluid speech   ED Results / Procedures / Treatments   Labs (all labs ordered are listed, but only abnormal results are displayed) Labs Reviewed - No data to display   EKG EKG independently reviewed interpreted by myself (ER attending) demonstrates:    RADIOLOGY Imaging independently reviewed and interpreted by myself demonstrates:  X- without evidence of rib fracture, pneumothorax PROCEDURES:  Critical Care performed: No  Procedures   MEDICATIONS ORDERED IN ED: Medications - No data to  display   IMPRESSION / MDM / ASSESSMENT AND PLAN / ED COURSE  I reviewed the triage vital signs and the nursing notes.  Differential diagnosis includes, but is not limited to, for fracture, pneumothorax, bruised rib, pleurisy, pneumonia  Patient's presentation is most consistent with acute complicated illness / injury requiring diagnostic workup.  47 year old male presenting with rib pain.  X- reassuring, stable vitals.  Patient did admit to alcohol use earlier today and was noted to have difficulty ambulating in triage.  Plan was to allow patient to metabolize further and ultimately be discharged.  I was informed by the patient's nurse that after several hours in the ER, but prior to receiving discharge instructions, the patient ambulated outside of the ER.  Suspect that he was clinically sober at this time, but I was unable to provide typical discharge precautions.    FINAL CLINICAL IMPRESSION(S) / ED DIAGNOSES   Final diagnoses:  Rib pain on right side     Rx / DC Orders   ED Discharge Orders     None        Note:  This document was prepared using Dragon voice recognition software and may include unintentional dictation  errors.   Trinna Post, MD 05/17/23 Barry Brunner

## 2023-05-17 NOTE — ED Notes (Addendum)
Pt standing at nurses station stating that he wanted to go outside. Dr. Rosalia Hammers stated plan is to discharge. Pt walked out lobby before receiving papers. Pt ambulated up hallway with steady gait to lobby. Pt's speech clear. Pt refused discharge vitals.

## 2023-05-17 NOTE — ED Triage Notes (Signed)
Pt co of right sided rib pain x 3 days. Pt states he feels like his neuropathy is acting up. Pt denies recent injuries. Pt states he is an alcoholic and last drank 1 hr ago. Pt states if he goes without alcohol for a long period of time he will have a seizure. Pt unable to ambulate withotu assiatnce on arrival.

## 2023-05-17 NOTE — ED Notes (Signed)
Pt not triaged by this RN, error in charting. Pt triaged by Anell Barr, RN.

## 2023-05-17 NOTE — ED Notes (Signed)
FIRST NURSE NOTE:  Observed patient ambulate outside front lobby doors with water cup in hand.

## 2023-06-05 ENCOUNTER — Other Ambulatory Visit: Payer: Self-pay

## 2023-06-05 DIAGNOSIS — Z7951 Long term (current) use of inhaled steroids: Secondary | ICD-10-CM | POA: Insufficient documentation

## 2023-06-05 DIAGNOSIS — F10929 Alcohol use, unspecified with intoxication, unspecified: Secondary | ICD-10-CM | POA: Diagnosis not present

## 2023-06-05 DIAGNOSIS — F10129 Alcohol abuse with intoxication, unspecified: Secondary | ICD-10-CM | POA: Diagnosis not present

## 2023-06-05 DIAGNOSIS — Y908 Blood alcohol level of 240 mg/100 ml or more: Secondary | ICD-10-CM | POA: Diagnosis not present

## 2023-06-05 DIAGNOSIS — I10 Essential (primary) hypertension: Secondary | ICD-10-CM | POA: Insufficient documentation

## 2023-06-05 DIAGNOSIS — F10229 Alcohol dependence with intoxication, unspecified: Secondary | ICD-10-CM | POA: Insufficient documentation

## 2023-06-05 DIAGNOSIS — Z79899 Other long term (current) drug therapy: Secondary | ICD-10-CM | POA: Insufficient documentation

## 2023-06-05 DIAGNOSIS — G629 Polyneuropathy, unspecified: Secondary | ICD-10-CM | POA: Diagnosis not present

## 2023-06-05 DIAGNOSIS — Z743 Need for continuous supervision: Secondary | ICD-10-CM | POA: Diagnosis not present

## 2023-06-05 LAB — COMPREHENSIVE METABOLIC PANEL
ALT: 57 U/L — ABNORMAL HIGH (ref 0–44)
AST: 170 U/L — ABNORMAL HIGH (ref 15–41)
Albumin: 4.1 g/dL (ref 3.5–5.0)
Alkaline Phosphatase: 151 U/L — ABNORMAL HIGH (ref 38–126)
Anion gap: 22 — ABNORMAL HIGH (ref 5–15)
BUN: 10 mg/dL (ref 6–20)
CO2: 15 mmol/L — ABNORMAL LOW (ref 22–32)
Calcium: 9 mg/dL (ref 8.9–10.3)
Chloride: 97 mmol/L — ABNORMAL LOW (ref 98–111)
Creatinine, Ser: 0.89 mg/dL (ref 0.61–1.24)
GFR, Estimated: >60 mL/min (ref >60)
Glucose, Bld: 84 mg/dL (ref 70–99)
Potassium: 4.2 mmol/L (ref 3.5–5.1)
Sodium: 134 mmol/L — ABNORMAL LOW (ref 135–145)
Total Bilirubin: 0.8 mg/dL (ref 0.3–1.2)
Total Protein: 7.9 g/dL (ref 6.5–8.1)

## 2023-06-05 LAB — ETHANOL: Alcohol, Ethyl (B): 391 mg/dL (ref <10)

## 2023-06-05 MED ORDER — SODIUM CHLORIDE 0.9 % IV BOLUS (SEPSIS)
1000.0000 mL | Freq: Once | INTRAVENOUS | Status: AC
  Administered 2023-06-06: 1000 mL via INTRAVENOUS

## 2023-06-05 NOTE — ED Notes (Signed)
Lab called with result: [id number]

## 2023-06-05 NOTE — ED Triage Notes (Signed)
Pt reports he has neuropathy and is acting up, pt reports is an alcoholic and last drink today about lunch time. Pt reports he has had seizures in the past due to alcohol withdrawals. Pt talks in complete sentences no respiratory distress noted.

## 2023-06-06 ENCOUNTER — Emergency Department
Admission: EM | Admit: 2023-06-06 | Discharge: 2023-06-06 | Disposition: A | Discharge status: Home / Self Care | Payer: 59 | Attending: Emergency Medicine | Admitting: Emergency Medicine

## 2023-06-06 DIAGNOSIS — G629 Polyneuropathy, unspecified: Secondary | ICD-10-CM

## 2023-06-06 DIAGNOSIS — F10229 Alcohol dependence with intoxication, unspecified: Secondary | ICD-10-CM | POA: Diagnosis not present

## 2023-06-06 DIAGNOSIS — F10920 Alcohol use, unspecified with intoxication, uncomplicated: Secondary | ICD-10-CM

## 2023-06-06 LAB — CBC WITH DIFFERENTIAL/PLATELET
Abs Immature Granulocytes: 0.01 10*3/uL (ref 0.00–0.07)
Basophils Absolute: 0.1 10*3/uL (ref 0.0–0.1)
Basophils Relative: 2 %
Eosinophils Absolute: 0.1 10*3/uL (ref 0.0–0.5)
Eosinophils Relative: 2 %
HCT: 35.4 % — ABNORMAL LOW (ref 39.0–52.0)
Hemoglobin: 12.3 g/dL — ABNORMAL LOW (ref 13.0–17.0)
Immature Granulocytes: 0 %
Lymphocytes Relative: 35 %
Lymphs Abs: 1.3 10*3/uL (ref 0.7–4.0)
MCH: 32.6 pg (ref 26.0–34.0)
MCHC: 34.7 g/dL (ref 30.0–36.0)
MCV: 93.9 fL (ref 80.0–100.0)
Monocytes Absolute: 0.2 10*3/uL (ref 0.1–1.0)
Monocytes Relative: 6 %
Neutro Abs: 2 10*3/uL (ref 1.7–7.7)
Neutrophils Relative %: 55 %
Platelets: 97 10*3/uL — ABNORMAL LOW (ref 150–400)
RBC: 3.77 MIL/uL — ABNORMAL LOW (ref 4.22–5.81)
RDW: 12 % (ref 11.5–15.5)
WBC: 3.6 10*3/uL — ABNORMAL LOW (ref 4.0–10.5)
nRBC: 0 % (ref 0.0–0.2)

## 2023-06-06 LAB — URINE DRUG SCREEN, QUALITATIVE (ARMC ONLY)
Amphetamines, Ur Screen: NOT DETECTED
Barbiturates, Ur Screen: NOT DETECTED
Benzodiazepine, Ur Scrn: NOT DETECTED
Cannabinoid 50 Ng, Ur ~~LOC~~: NOT DETECTED
Cocaine Metabolite,Ur ~~LOC~~: NOT DETECTED
MDMA (Ecstasy)Ur Screen: NOT DETECTED
Methadone Scn, Ur: NOT DETECTED
Opiate, Ur Screen: NOT DETECTED
Phencyclidine (PCP) Ur S: NOT DETECTED
Tricyclic, Ur Screen: NOT DETECTED

## 2023-06-06 MED ORDER — LORAZEPAM 2 MG/ML IJ SOLN: 0.0000 mg | Freq: Four times a day (QID) | INTRAMUSCULAR | Status: DC

## 2023-06-06 MED ORDER — THIAMINE HCL 100 MG PO TABS
100.0000 mg | ORAL_TABLET | Freq: Every day | ORAL | Status: DC
  Filled 2023-06-06: qty 1

## 2023-06-06 MED ORDER — LORAZEPAM 2 MG/ML IJ SOLN
1.0000 mg | Freq: Once | INTRAMUSCULAR | Status: AC
  Administered 2023-06-06: 1 mg via INTRAVENOUS
  Filled 2023-06-06: qty 1

## 2023-06-06 NOTE — ED Provider Notes (Signed)
[facility] Provider Note    Event Date/Time   First MD Initiated Contact with Patient 06/06/23 0157     (approximate)   History   Neurologic Problem   HPI  Lance Green is a 47 y.o. male who presents to the ED from home with a chief complaint of neuropathy pain.  Patient has chronic neuropathy pain taking gabapentin.  Also history of EtOH abuse and states he drinks to help his pain.  History of DTs.  Recently drink alcohol.  Endorses mainly painful neuropathy in bilateral feet.  Denies chest pain, shortness of breath, abdominal pain, nausea, vomiting or dizziness.     Past Medical History   Past Medical History:  Diagnosis Date   Alcohol abuse    HTN (hypertension)    Neuropathy    Pancreatitis    Peptic ulcer disease      Active Problem List   Patient Active Problem List   Diagnosis Date Noted   Neuropathy 04/25/2023   Hepatic steatosis 04/25/2023   Seizure due to alcohol withdrawal (HCC) 04/24/2023   Thrombocytopenia (HCC) 04/24/2023   ETOH abuse 04/23/2023   Transaminitis 04/23/2023   Tobacco abuse counseling 04/23/2023   Dysphagia 04/05/2022   Hypokalemia 04/01/2022   Rhabdomyolysis 04/01/2022   Fall at home, initial encounter 04/01/2022   Cervical cord compression with myelopathy (HCC) 03/28/2022   Cavitary pneumonia 02/20/2021   Sepsis (HCC) 02/20/2021   Normocytic anemia 02/20/2021   Essential hypertension    Alcohol abuse 11/21/2019   Hypomagnesemia 11/21/2019   Abscess of left middle finger 11/10/2018   Acute appendicitis with localized peritonitis 07/06/2018     Past Surgical History   Past Surgical History:  Procedure Laterality Date   ANTERIOR CERVICAL DECOMP/DISCECTOMY FUSION N/A 04/02/2022   Procedure: C3-6 ANTERIOR CERVICAL DECOMPRESSION/DISCECTOMY FUSION 3 LEVELS;  Surgeon: Venetia Night, MD;  Location: [facility] ORS;  Service: Neurosurgery;  Laterality: N/A;   APPENDECTOMY     INCISION AND DRAINAGE Left  11/11/2018   Procedure: INCISION AND DRAINAGE;  Surgeon: Donato Heinz, MD;  Location: [facility] OR[facility];  Service: Orthopedics;  Laterality: Left;   LAPAROSCOPIC APPENDECTOMY N/A 07/06/2018   Procedure: APPENDECTOMY LAPAROSCOPIC;  Surgeon: Carolan Shiver, MD;  Location: [facility] OR[facility];  Service: General;  Laterality: N/A;     Home Medications   Prior to Admission medications   Medication Sig Start Date End Date Taking? Authorizing Provider  albuterol (VENTOLIN HFA) 108 (90 Base) MCG/ACT inhaler Inhale 2 puffs into the lungs every 6 (six) hours as needed for wheezing or shortness of breath. 04/25/23   Renae Gloss, Richard, MD  amLODipine (NORVASC) 2.5 MG tablet Take 1 tablet (2.5 mg total) by mouth daily. 04/26/23   Alford Highland, MD  folic acid (FOLVITE) 1 MG tablet Take 1 tablet (1 mg total) by mouth daily. 04/26/23   Alford Highland, MD  gabapentin (NEURONTIN) 100 MG capsule Take 1 capsule (100 mg total) by mouth 3 (three) times daily. 04/25/23 05/25/23  Alford Highland, MD  LORazepam (ATIVAN) 0.5 MG tablet One tab po every eight hours for two days then one tab po twice a day for two days then one tab po daily for three days 04/25/23   Alford Highland, MD  Multiple Vitamin (MULTIVITAMIN WITH MINERALS) TABS tablet Take 1 tablet by mouth daily. 04/26/23   Alford Highland, MD  nicotine (NICODERM CQ - DOSED IN MG/24 HOURS) 14 mg/24hr patch One 14 mg patch chest wall daily (okay to substitute generic) 04/25/23   Wieting, Gerlene Burdock,  MD  thiamine (VITAMIN B-1) 100 MG tablet Take 1 tablet (100 mg total) by mouth daily. 04/26/23   Alford Highland, MD     Allergies  Patient has no known allergies.   Family History   Family History  Problem Relation Age of Onset   Cataracts Mother      Physical Exam  Triage Vital Signs: ED Triage Vitals  Encounter Vitals Group     BP 06/05/23 2211 (!) 132/98     Systolic BP Percentile --      Diastolic BP Percentile --      Pulse Rate 06/05/23 2211 78     Resp  06/05/23 2211 16     Temp 06/05/23 2211 97.7 F (36.5 C)     Temp Source 06/05/23 2211 Oral     SpO2 06/05/23 2211 98 %     Weight 06/05/23 2212 145 lb (65.8 kg)     Height 06/05/23 2212 5\' 6"  (1.676 m)     Head Circumference --      Peak Flow --      Pain Score 06/05/23 2212 5     Pain Loc --      Pain Education --      Exclude from Growth Chart --     Updated Vital Signs: BP (!) 136/91   Pulse 78   Temp 98 F (36.7 C) (Oral)   Resp 20   Ht 5\' 6"  (1.676 m)   Wt 65.8 kg   SpO2 100%   BMI 23.40 kg/m    General: Awake, no distress.  CV:  RRR.  Good peripheral perfusion.  Resp:  Normal effort.  CTAB. Abd:  Nontender.  No distention.  Other:  Intoxicated.   ED Results / Procedures / Treatments  Labs (all labs ordered are listed, but only abnormal results are displayed) Labs Reviewed  COMPREHENSIVE METABOLIC PANEL - Abnormal; Notable for the following components:      Result Value   Sodium 134 (*)    Chloride 97 (*)    CO2 15 (*)    AST 170 (*)    ALT 57 (*)    Alkaline Phosphatase 151 (*)    Anion gap 22 (*)    All other components within normal limits  ETHANOL - Abnormal; Notable for the following components:   Alcohol, Ethyl (B) 391 (*)    All other components within normal limits  CBC WITH DIFFERENTIAL/PLATELET - Abnormal; Notable for the following components:   WBC 3.6 (*)    RBC 3.77 (*)    Hemoglobin 12.3 (*)    HCT 35.4 (*)    Platelets 97 (*)    All other components within normal limits  URINE DRUG SCREEN, QUALITATIVE ([facility] ONLY)  CBC WITH DIFFERENTIAL/PLATELET     EKG  None   RADIOLOGY None   Official radiology report(s): No results found.   PROCEDURES:  Critical Care performed: No  Procedures   MEDICATIONS ORDERED IN ED: Medications  thiamine (VITAMIN B1) tablet 100 mg (has no administration in time range)  LORazepam (ATIVAN) injection 0-4 mg ( Intravenous Not Given 06/06/23 0543)  sodium chloride 0.9 % bolus 1,000 mL (0 mLs  Intravenous Stopped 06/06/23 0055)  LORazepam (ATIVAN) injection 1 mg (1 mg Intravenous Given 06/06/23 0222)     IMPRESSION / MDM / ASSESSMENT AND PLAN / ED COURSE  I reviewed the triage vital signs and the nursing notes.  47 year old male presenting with pain secondary to neuropathy.  I have personally reviewed patient's records and note a last hospitalization for alcohol withdrawal on 04/23/2023.  Patient's presentation is most consistent with severe exacerbation of chronic illness.  Laboratory results remarkable for mildly elevated LFTs with mild elevation of anion gap likely secondary to alcohol.  Elevated EtOH 391.  Patient received IV fluids prior to being roomed.  Administer 1 mg IV Ativan, monitor and observe overnight until sobriety.  Clinical Course as of 06/06/23 1610  Sat Jun 06, 2023  [id number] Patient feeling better, did not meet CIWA scale for further Ativan.  Ambulatory with steady gait.  Strict return precautions given.  Patient and spouse verbalized understanding and agree with plan of care. [JS]    Clinical Course User Index [JS] Irean Hong, MD     FINAL CLINICAL IMPRESSION(S) / ED DIAGNOSES   Final diagnoses:  Alcoholic intoxication without complication (HCC)  Polyneuropathy     Rx / DC Orders   ED Discharge Orders     None        Note:  This document was prepared using Dragon voice recognition software and may include unintentional dictation errors.   Irean Hong, MD 06/06/23 [phone]

## 2023-06-06 NOTE — Discharge Instructions (Signed)
Drink alcohol only in moderation.  Return to the ER for worsening symptoms, persistent vomiting, difficulty breathing or other concerns. 

## 2023-06-19 ENCOUNTER — Telehealth: Payer: Self-pay

## 2023-06-19 NOTE — Telephone Encounter (Signed)
Transition Care Management Follow-up Telephone Call Date of discharge and from where: 06/06/2023 [facility] How have you been since you were released from the hospital? Patient stated he has body aches and is not feeling well. Any questions or concerns? No  Items Reviewed: Did the pt receive and understand the discharge instructions provided? Yes  Medications obtained and verified?  No medication prescribed. Other? No  Any new allergies since your discharge? No  Dietary orders reviewed? Yes Do you have support at home? Yes   Follow up appointments reviewed:  PCP Hospital f/u appt confirmed? No  Scheduled to see  on  @ . Specialist Hospital f/u appt confirmed? No  Scheduled to see  on  @ . Are transportation arrangements needed? No  If their condition worsens, is the pt aware to call PCP or go to the Emergency Dept.? Yes Was the patient provided with contact information for the PCP's office or ED? Yes Was to pt encouraged to call back with questions or concerns? Yes  Shantoria Ellwood [facility]  [facility] Guide Direct Dial: [phone]  Website: [email or web address]

## 2023-12-16 ENCOUNTER — Inpatient Hospital Stay
Admission: EM | Admit: 2023-12-16 | Discharge: 2023-12-21 | DRG: 177 | Disposition: A | Discharge status: Home / Self Care | Payer: MEDICAID | Attending: Internal Medicine | Admitting: Internal Medicine

## 2023-12-16 ENCOUNTER — Emergency Department: Payer: MEDICAID

## 2023-12-16 DIAGNOSIS — Z8711 Personal history of peptic ulcer disease: Secondary | ICD-10-CM

## 2023-12-16 DIAGNOSIS — Z111 Encounter for screening for respiratory tuberculosis: Secondary | ICD-10-CM

## 2023-12-16 DIAGNOSIS — J158 Pneumonia due to other specified bacteria: Principal | ICD-10-CM | POA: Diagnosis present

## 2023-12-16 DIAGNOSIS — G40909 Epilepsy, unspecified, not intractable, without status epilepticus: Secondary | ICD-10-CM | POA: Diagnosis present

## 2023-12-16 DIAGNOSIS — I776 Arteritis, unspecified: Secondary | ICD-10-CM | POA: Diagnosis present

## 2023-12-16 DIAGNOSIS — Z8701 Personal history of pneumonia (recurrent): Secondary | ICD-10-CM

## 2023-12-16 DIAGNOSIS — T510X1A Toxic effect of ethanol, accidental (unintentional), initial encounter: Secondary | ICD-10-CM | POA: Diagnosis present

## 2023-12-16 DIAGNOSIS — D500 Iron deficiency anemia secondary to blood loss (chronic): Secondary | ICD-10-CM

## 2023-12-16 DIAGNOSIS — F1010 Alcohol abuse, uncomplicated: Secondary | ICD-10-CM | POA: Diagnosis present

## 2023-12-16 DIAGNOSIS — B961 Klebsiella pneumoniae [K. pneumoniae] as the cause of diseases classified elsewhere: Secondary | ICD-10-CM | POA: Diagnosis present

## 2023-12-16 DIAGNOSIS — Z981 Arthrodesis status: Secondary | ICD-10-CM

## 2023-12-16 DIAGNOSIS — F1090 Alcohol use, unspecified, uncomplicated: Secondary | ICD-10-CM

## 2023-12-16 DIAGNOSIS — F17200 Nicotine dependence, unspecified, uncomplicated: Secondary | ICD-10-CM | POA: Insufficient documentation

## 2023-12-16 DIAGNOSIS — E871 Hypo-osmolality and hyponatremia: Secondary | ICD-10-CM | POA: Diagnosis present

## 2023-12-16 DIAGNOSIS — J850 Gangrene and necrosis of lung: Secondary | ICD-10-CM | POA: Diagnosis present

## 2023-12-16 DIAGNOSIS — Z7982 Long term (current) use of aspirin: Secondary | ICD-10-CM

## 2023-12-16 DIAGNOSIS — J150 Pneumonia due to Klebsiella pneumoniae: Principal | ICD-10-CM | POA: Diagnosis present

## 2023-12-16 DIAGNOSIS — I10 Essential (primary) hypertension: Secondary | ICD-10-CM | POA: Diagnosis present

## 2023-12-16 DIAGNOSIS — D649 Anemia, unspecified: Principal | ICD-10-CM

## 2023-12-16 DIAGNOSIS — E8809 Other disorders of plasma-protein metabolism, not elsewhere classified: Secondary | ICD-10-CM | POA: Diagnosis present

## 2023-12-16 DIAGNOSIS — Z716 Tobacco abuse counseling: Secondary | ICD-10-CM

## 2023-12-16 DIAGNOSIS — R7982 Elevated C-reactive protein (CRP): Secondary | ICD-10-CM | POA: Diagnosis present

## 2023-12-16 DIAGNOSIS — J189 Pneumonia, unspecified organism: Secondary | ICD-10-CM

## 2023-12-16 DIAGNOSIS — F17210 Nicotine dependence, cigarettes, uncomplicated: Secondary | ICD-10-CM | POA: Diagnosis present

## 2023-12-16 DIAGNOSIS — Z79899 Other long term (current) drug therapy: Secondary | ICD-10-CM

## 2023-12-16 DIAGNOSIS — Z1152 Encounter for screening for COVID-19: Secondary | ICD-10-CM

## 2023-12-16 LAB — CBC WITH DIFFERENTIAL/PLATELET
Abs Immature Granulocytes: 0.06 10*3/uL (ref 0.00–0.07)
Basophils Absolute: 0.1 10*3/uL (ref 0.0–0.1)
Basophils Relative: 0 %
Eosinophils Absolute: 0.5 10*3/uL (ref 0.0–0.5)
Eosinophils Relative: 4 %
HCT: 18.5 % — ABNORMAL LOW (ref 39.0–52.0)
Hemoglobin: 6.3 g/dL — ABNORMAL LOW (ref 13.0–17.0)
Immature Granulocytes: 1 %
Lymphocytes Relative: 31 %
Lymphs Abs: 3.6 10*3/uL (ref 0.7–4.0)
MCH: 30.7 pg (ref 26.0–34.0)
MCHC: 34.1 g/dL (ref 30.0–36.0)
MCV: 90.2 fL (ref 80.0–100.0)
Monocytes Absolute: 0.9 10*3/uL (ref 0.1–1.0)
Monocytes Relative: 8 %
Neutro Abs: 6.4 10*3/uL (ref 1.7–7.7)
Neutrophils Relative %: 56 %
Platelets: 260 10*3/uL (ref 150–400)
RBC: 2.05 MIL/uL — ABNORMAL LOW (ref 4.22–5.81)
RDW: 12.6 % (ref 11.5–15.5)
Smear Review: NORMAL
WBC: 11.4 10*3/uL — ABNORMAL HIGH (ref 4.0–10.5)
nRBC: 0 % (ref 0.0–0.2)

## 2023-12-16 LAB — COMPREHENSIVE METABOLIC PANEL
ALT: 9 U/L (ref 0–44)
AST: 21 U/L (ref 15–41)
Albumin: 2.2 g/dL — ABNORMAL LOW (ref 3.5–5.0)
Alkaline Phosphatase: 66 U/L (ref 38–126)
Anion gap: 11 (ref 5–15)
BUN: 5 mg/dL — ABNORMAL LOW (ref 6–20)
CO2: 18 mmol/L — ABNORMAL LOW (ref 22–32)
Calcium: 8.3 mg/dL — ABNORMAL LOW (ref 8.9–10.3)
Chloride: 101 mmol/L (ref 98–111)
Creatinine, Ser: 0.7 mg/dL (ref 0.61–1.24)
GFR, Estimated: >60 mL/min (ref >60)
Glucose, Bld: 89 mg/dL (ref 70–99)
Potassium: 3.8 mmol/L (ref 3.5–5.1)
Sodium: 130 mmol/L — ABNORMAL LOW (ref 135–145)
Total Bilirubin: 0.4 mg/dL (ref 0.0–1.2)
Total Protein: 6.5 g/dL (ref 6.5–8.1)

## 2023-12-16 LAB — URINALYSIS, W/ REFLEX TO CULTURE (INFECTION SUSPECTED)
Bacteria, UA: NONE SEEN
Bilirubin Urine: NEGATIVE
Glucose, UA: NEGATIVE mg/dL
Hgb urine dipstick: NEGATIVE
Ketones, ur: NEGATIVE mg/dL
Leukocytes,Ua: NEGATIVE
Nitrite: NEGATIVE
Protein, ur: NEGATIVE mg/dL
RBC / HPF: 0 RBC/hpf (ref 0–5)
Specific Gravity, Urine: 1.001 — ABNORMAL LOW (ref 1.005–1.030)
WBC, UA: 0 WBC/hpf (ref 0–5)
pH: 5 (ref 5.0–8.0)

## 2023-12-16 LAB — LACTIC ACID, PLASMA: Lactic Acid, Venous: 1.6 mmol/L (ref 0.5–1.9)

## 2023-12-16 LAB — PROTIME-INR
INR: 1.1 (ref 0.8–1.2)
Prothrombin Time: 14 s (ref 11.4–15.2)

## 2023-12-16 MED ORDER — PANTOPRAZOLE SODIUM 40 MG IV SOLR
40.0000 mg | INTRAVENOUS | Status: AC
  Administered 2023-12-17 (×2): 40 mg via INTRAVENOUS
  Filled 2023-12-16: qty 10

## 2023-12-16 MED ORDER — PANTOPRAZOLE SODIUM 40 MG IV SOLR
40.0000 mg | Freq: Two times a day (BID) | INTRAVENOUS | Status: DC
Start: 2023-12-17 — End: 2023-12-17

## 2023-12-16 MED ORDER — SODIUM CHLORIDE 0.9 % IV SOLN
10.0000 mL/h | Freq: Once | INTRAVENOUS | Status: AC
  Administered 2023-12-17: 10 mL/h via INTRAVENOUS

## 2023-12-16 NOTE — ED Notes (Signed)
 CCMD called for cardiac monitoring.

## 2023-12-16 NOTE — ED Provider Notes (Signed)
 [facility] Provider Note    Event Date/Time   First MD Initiated Contact with Patient 12/16/23 2304     (approximate)  History   Chief Complaint: Fever (From home states he has been having intermittent SHOB. States he has been feeling sick and shob x 2 weeks. Having productive cough with brown sputum. ) and Shortness of Breath  HPI  Lance Green is a 48 y.o. male with a past medical history of alcohol abuse, hypertension, presents to the emergency department for 1 month of generalized weakness fatigue shortness of breath nausea decreased appetite.  According to the patient he thought he had a "cold" states he has had a slight cough as well with occasional sputum production.  Over the past 1 month his shortness of breath is worsened significantly especially with any type of exertion so the patient finally came into the emergency department today for evaluation.  Patient denies any vomiting but does state nausea.  Denies any black or bloody stool.  No abdominal pain.  Physical Exam   Triage Vital Signs: ED Triage Vitals [12/16/23 2224]  Encounter Vitals Group     BP 111/75     Systolic BP Percentile      Diastolic BP Percentile      Pulse Rate 85     Resp 20     Temp 98.6 F (37 C)     Temp Source Oral     SpO2 98 %     Weight      Height      Head Circumference      Peak Flow      Pain Score      Pain Loc      Pain Education      Exclude from Growth Chart     Most recent vital signs: Vitals:   12/16/23 2224  BP: 111/75  Pulse: 85  Resp: 20  Temp: 98.6 F (37 C)  SpO2: 98%    General: Awake, no distress.  CV:  Good peripheral perfusion.  Regular rate and rhythm  Resp:  Normal effort.  Equal breath sounds bilaterally.  Abd:  No distention.  Soft, nontender.  No rebound or guarding.  Benign abdomen Other:  Rectal examination shows brown stool however guaiac positive.   ED Results / Procedures / Treatments   EKG  EKG viewed and  interpreted by myself shows a sinus rhythm at 91 bpm with a narrow QRS, normal axis, normal intervals, no concerning ST changes.  RADIOLOGY  I have reviewed and interpreted the x-ray images.  Patient appears to have an opacity in the right midlung possible pneumonia versus mass. Radiology is read the x-ray as opacity suspicious for pneumonia possible cavitary component.  Will obtain CT imaging to further evaluate.   MEDICATIONS ORDERED IN ED: Medications  pantoprazole (PROTONIX) injection 40 mg (has no administration in time range)    Followed by  pantoprazole (PROTONIX) injection 40 mg (has no administration in time range)  0.9 %  sodium chloride infusion (has no administration in time range)     IMPRESSION / MDM / ASSESSMENT AND PLAN / ED COURSE  I reviewed the triage vital signs and the nursing notes.  Patient's presentation is most consistent with acute presentation with potential threat to life or bodily function.  Patient presents to the emergency department for 1 month of cough congestion generalized weakness shortness of breath.  Patient states weakness with any type of exertion.  Patient's workup today  shows significant anemia down to 6.3 which appears new compared to several months ago.  Rectal examination shows brown stool but guaiac positive.  Patient denies any blood thinners but does state significant alcohol use.  Will start the patient on Protonix through the IV, I have ordered a packed red blood cell unit for the patient and I have verbally consented the patient to the transfusion.  Chemistry shows no significant findings.  Patient's urinalysis is normal.  Patient's chest x-ray however does show an opacity in the right midlung concerning for possible pneumonia.  Possible cavitary component.  Given the patient's history of alcoholism we will cover with aspiration pneumonia antibiotics.  Will obtain CT imaging to further evaluate.  Patient will require admission to the hospital  service once his emergency department workup is been completed.  CT consistent with cavitary pneumonia.  Patient receiving antibiotics.  Will admit to the hospital service for workup and treatment.  CRITICAL CARE Performed by: Minna Antis   Total critical care time: 45 minutes  Critical care time was exclusive of separately billable procedures and treating other patients.  Critical care was necessary to treat or prevent imminent or life-threatening deterioration.  Critical care was time spent personally by me on the following activities: development of treatment plan with patient and/or surrogate as well as nursing, discussions with consultants, evaluation of patient's response to treatment, examination of patient, obtaining history from patient or surrogate, ordering and performing treatments and interventions, ordering and review of laboratory studies, ordering and review of radiographic studies, pulse oximetry and re-evaluation of patient's condition.   FINAL CLINICAL IMPRESSION(S) / ED DIAGNOSES   Pneumonia Symptomatic anemia GI bleed   Note:  This document was prepared using Dragon voice recognition software and may include unintentional dictation errors.   Minna Antis, MD 12/17/23 [phone]

## 2023-12-17 ENCOUNTER — Emergency Department: Payer: MEDICAID

## 2023-12-17 DIAGNOSIS — J850 Gangrene and necrosis of lung: Secondary | ICD-10-CM | POA: Diagnosis present

## 2023-12-17 DIAGNOSIS — F1010 Alcohol abuse, uncomplicated: Secondary | ICD-10-CM | POA: Diagnosis present

## 2023-12-17 DIAGNOSIS — Z8711 Personal history of peptic ulcer disease: Secondary | ICD-10-CM | POA: Diagnosis not present

## 2023-12-17 DIAGNOSIS — F17210 Nicotine dependence, cigarettes, uncomplicated: Secondary | ICD-10-CM | POA: Diagnosis present

## 2023-12-17 DIAGNOSIS — F17200 Nicotine dependence, unspecified, uncomplicated: Secondary | ICD-10-CM | POA: Insufficient documentation

## 2023-12-17 DIAGNOSIS — I10 Essential (primary) hypertension: Secondary | ICD-10-CM | POA: Diagnosis present

## 2023-12-17 DIAGNOSIS — J189 Pneumonia, unspecified organism: Secondary | ICD-10-CM

## 2023-12-17 DIAGNOSIS — J158 Pneumonia due to other specified bacteria: Secondary | ICD-10-CM | POA: Diagnosis present

## 2023-12-17 DIAGNOSIS — F1090 Alcohol use, unspecified, uncomplicated: Secondary | ICD-10-CM

## 2023-12-17 DIAGNOSIS — Z8701 Personal history of pneumonia (recurrent): Secondary | ICD-10-CM | POA: Diagnosis not present

## 2023-12-17 DIAGNOSIS — J984 Other disorders of lung: Secondary | ICD-10-CM

## 2023-12-17 DIAGNOSIS — E871 Hypo-osmolality and hyponatremia: Secondary | ICD-10-CM | POA: Diagnosis present

## 2023-12-17 DIAGNOSIS — I776 Arteritis, unspecified: Secondary | ICD-10-CM | POA: Diagnosis present

## 2023-12-17 DIAGNOSIS — Z716 Tobacco abuse counseling: Secondary | ICD-10-CM | POA: Diagnosis not present

## 2023-12-17 DIAGNOSIS — G40909 Epilepsy, unspecified, not intractable, without status epilepticus: Secondary | ICD-10-CM | POA: Diagnosis present

## 2023-12-17 DIAGNOSIS — D500 Iron deficiency anemia secondary to blood loss (chronic): Secondary | ICD-10-CM

## 2023-12-17 DIAGNOSIS — B961 Klebsiella pneumoniae [K. pneumoniae] as the cause of diseases classified elsewhere: Secondary | ICD-10-CM | POA: Diagnosis present

## 2023-12-17 DIAGNOSIS — R7982 Elevated C-reactive protein (CRP): Secondary | ICD-10-CM | POA: Diagnosis present

## 2023-12-17 DIAGNOSIS — Z111 Encounter for screening for respiratory tuberculosis: Secondary | ICD-10-CM | POA: Diagnosis not present

## 2023-12-17 DIAGNOSIS — D649 Anemia, unspecified: Secondary | ICD-10-CM

## 2023-12-17 DIAGNOSIS — Z7982 Long term (current) use of aspirin: Secondary | ICD-10-CM | POA: Diagnosis not present

## 2023-12-17 DIAGNOSIS — J150 Pneumonia due to Klebsiella pneumoniae: Secondary | ICD-10-CM | POA: Diagnosis present

## 2023-12-17 DIAGNOSIS — Z981 Arthrodesis status: Secondary | ICD-10-CM | POA: Diagnosis not present

## 2023-12-17 DIAGNOSIS — Z79899 Other long term (current) drug therapy: Secondary | ICD-10-CM | POA: Diagnosis not present

## 2023-12-17 DIAGNOSIS — E8809 Other disorders of plasma-protein metabolism, not elsewhere classified: Secondary | ICD-10-CM | POA: Diagnosis present

## 2023-12-17 DIAGNOSIS — Z1152 Encounter for screening for COVID-19: Secondary | ICD-10-CM | POA: Diagnosis not present

## 2023-12-17 DIAGNOSIS — T510X1A Toxic effect of ethanol, accidental (unintentional), initial encounter: Secondary | ICD-10-CM | POA: Diagnosis present

## 2023-12-17 LAB — CBC
HCT: 27.8 % — ABNORMAL LOW (ref 39.0–52.0)
Hemoglobin: 9.6 g/dL — ABNORMAL LOW (ref 13.0–17.0)
MCH: 30.1 pg (ref 26.0–34.0)
MCHC: 34.5 g/dL (ref 30.0–36.0)
MCV: 87.1 fL (ref 80.0–100.0)
Platelets: 298 10*3/uL (ref 150–400)
RBC: 3.19 MIL/uL — ABNORMAL LOW (ref 4.22–5.81)
RDW: 13.7 % (ref 11.5–15.5)
WBC: 11.2 10*3/uL — ABNORMAL HIGH (ref 4.0–10.5)
nRBC: 0 % (ref 0.0–0.2)

## 2023-12-17 LAB — MRSA NEXT GEN BY PCR, NASAL: MRSA by PCR Next Gen: NOT DETECTED

## 2023-12-17 LAB — RESP PANEL BY RT-PCR (RSV, FLU A&B, COVID)  RVPGX2
Influenza A by PCR: NEGATIVE
Influenza B by PCR: NEGATIVE
Resp Syncytial Virus by PCR: NEGATIVE
SARS Coronavirus 2 by RT PCR: NEGATIVE

## 2023-12-17 LAB — EXPECTORATED SPUTUM ASSESSMENT W GRAM STAIN, RFLX TO RESP C

## 2023-12-17 LAB — IRON AND TIBC
Iron: 39 ug/dL — ABNORMAL LOW (ref 45–182)
Saturation Ratios: 24 % (ref 17.9–39.5)
TIBC: 165 ug/dL — ABNORMAL LOW (ref 250–450)
UIBC: 126 ug/dL

## 2023-12-17 LAB — FOLATE: Folate: 12.3 ng/mL (ref >5.9)

## 2023-12-17 LAB — PREPARE RBC (CROSSMATCH)

## 2023-12-17 LAB — LACTIC ACID, PLASMA: Lactic Acid, Venous: 1.4 mmol/L (ref 0.5–1.9)

## 2023-12-17 LAB — STREP PNEUMONIAE URINARY ANTIGEN: Strep Pneumo Urinary Antigen: NEGATIVE

## 2023-12-17 LAB — HEMOGLOBIN: Hemoglobin: 9.4 g/dL — ABNORMAL LOW (ref 13.0–17.0)

## 2023-12-17 LAB — FERRITIN: Ferritin: 474 ng/mL — ABNORMAL HIGH (ref 24–336)

## 2023-12-17 MED ORDER — SODIUM CHLORIDE 0.9 % IV SOLN
3.0000 g | Freq: Four times a day (QID) | INTRAVENOUS | Status: DC
  Administered 2023-12-17 (×2): 3 g via INTRAVENOUS
  Filled 2023-12-17 (×5): qty 8

## 2023-12-17 MED ORDER — IOHEXOL 300 MG/ML  SOLN
75.0000 mL | Freq: Once | INTRAMUSCULAR | Status: AC | PRN
  Administered 2023-12-17: 75 mL via INTRAVENOUS

## 2023-12-17 MED ORDER — AMLODIPINE BESYLATE 5 MG PO TABS: 2.5000 mg | ORAL_TABLET | Freq: Every day | ORAL | Status: DC

## 2023-12-17 MED ORDER — PANTOPRAZOLE SODIUM 40 MG IV SOLR
40.0000 mg | INTRAVENOUS | Status: DC
  Administered 2023-12-17 – 2023-12-19 (×3): 40 mg via INTRAVENOUS
  Filled 2023-12-17 (×3): qty 10

## 2023-12-17 MED ORDER — LORAZEPAM 1 MG PO TABS: 1.0000 mg | ORAL_TABLET | ORAL | Status: AC | PRN

## 2023-12-17 MED ORDER — SODIUM CHLORIDE 0.9 % IV SOLN
3.0000 g | Freq: Once | INTRAVENOUS | Status: AC
  Administered 2023-12-17: 3 g via INTRAVENOUS
  Filled 2023-12-17: qty 8

## 2023-12-17 MED ORDER — NICOTINE 14 MG/24HR TD PT24
14.0000 mg | MEDICATED_PATCH | Freq: Every day | TRANSDERMAL | Status: DC
  Filled 2023-12-17 (×4): qty 1

## 2023-12-17 MED ORDER — ACETAMINOPHEN 650 MG RE SUPP: 650.0000 mg | Freq: Four times a day (QID) | RECTAL | Status: DC | PRN

## 2023-12-17 MED ORDER — FOLIC ACID 1 MG PO TABS
1.0000 mg | ORAL_TABLET | Freq: Every day | ORAL | Status: DC
  Administered 2023-12-17 – 2023-12-21 (×5): 1 mg via ORAL
  Filled 2023-12-17 (×5): qty 1

## 2023-12-17 MED ORDER — PIPERACILLIN-TAZOBACTAM 3.375 G IVPB
3.3750 g | Freq: Three times a day (TID) | INTRAVENOUS | Status: DC
  Administered 2023-12-17 – 2023-12-21 (×12): 3.375 g via INTRAVENOUS
  Filled 2023-12-17 (×12): qty 50

## 2023-12-17 MED ORDER — FOLIC ACID 1 MG PO TABS: 1.0000 mg | ORAL_TABLET | Freq: Every day | ORAL | Status: DC

## 2023-12-17 MED ORDER — ONDANSETRON HCL 4 MG/2ML IJ SOLN
4.0000 mg | Freq: Four times a day (QID) | INTRAMUSCULAR | Status: DC | PRN
Start: 2023-12-17 — End: 2023-12-21

## 2023-12-17 MED ORDER — CHLORDIAZEPOXIDE HCL 25 MG PO CAPS
25.0000 mg | ORAL_CAPSULE | Freq: Three times a day (TID) | ORAL | Status: DC
  Administered 2023-12-17 – 2023-12-19 (×6): 25 mg via ORAL
  Filled 2023-12-17 (×7): qty 1

## 2023-12-17 MED ORDER — LORAZEPAM 2 MG/ML IJ SOLN: 0.0000 mg | Freq: Four times a day (QID) | INTRAMUSCULAR | Status: AC

## 2023-12-17 MED ORDER — THIAMINE MONONITRATE 100 MG PO TABS: 100.0000 mg | ORAL_TABLET | Freq: Every day | ORAL | Status: DC

## 2023-12-17 MED ORDER — THIAMINE MONONITRATE 100 MG PO TABS
100.0000 mg | ORAL_TABLET | Freq: Every day | ORAL | Status: DC
  Administered 2023-12-17 – 2023-12-21 (×5): 100 mg via ORAL
  Filled 2023-12-17 (×5): qty 1

## 2023-12-17 MED ORDER — ONDANSETRON HCL 4 MG PO TABS: 4.0000 mg | ORAL_TABLET | Freq: Four times a day (QID) | ORAL | Status: DC | PRN

## 2023-12-17 MED ORDER — LORAZEPAM 2 MG/ML IJ SOLN: 0.0000 mg | Freq: Two times a day (BID) | INTRAMUSCULAR | Status: AC

## 2023-12-17 MED ORDER — LORAZEPAM 2 MG/ML IJ SOLN: 1.0000 mg | INTRAMUSCULAR | Status: AC | PRN

## 2023-12-17 MED ORDER — ADULT MULTIVITAMIN W/MINERALS CH
1.0000 | ORAL_TABLET | Freq: Every day | ORAL | Status: DC
  Administered 2023-12-17 – 2023-12-21 (×5): 1 via ORAL
  Filled 2023-12-17 (×5): qty 1

## 2023-12-17 MED ORDER — ALBUTEROL SULFATE (2.5 MG/3ML) 0.083% IN NEBU: 2.5000 mg | INHALATION_SOLUTION | Freq: Four times a day (QID) | RESPIRATORY_TRACT | Status: DC | PRN

## 2023-12-17 MED ORDER — ACETAMINOPHEN 325 MG PO TABS: 650.0000 mg | ORAL_TABLET | Freq: Four times a day (QID) | ORAL | Status: DC | PRN

## 2023-12-17 MED ORDER — ADULT MULTIVITAMIN W/MINERALS CH: 1.0000 | ORAL_TABLET | Freq: Every day | ORAL | Status: DC

## 2023-12-17 MED ORDER — THIAMINE HCL 100 MG/ML IJ SOLN
100.0000 mg | Freq: Every day | INTRAMUSCULAR | Status: DC
  Filled 2023-12-17: qty 2
  Filled 2023-12-17: qty 1

## 2023-12-17 NOTE — ED Notes (Signed)
 Pt had urinated on himself. Pt's bedding changed, pt changed out into a hospital gown, and consent for blood products signed.

## 2023-12-17 NOTE — Consult Note (Signed)
 NAME: Lance Green  DOB: [birth year]  MRN: [id number]  Date/Time: 12/17/2023 4:06 PM  REQUESTING PROVIDER: Garrel Ridgel Subjective:  REASON FOR CONSULT: cavitary pneumonia ? Lance Green is a 48 y.o. with a history of ACDF c3-C6. 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 HE has night sweats, weight loss and poor appetite. No contact to Active TB. Does not work Lives with girlfriend In the ED vitals  12/16/23  BP 111/75  Temp 98.6 F (37 C)  Pulse Rate 85  Resp 20  SpO2 98 %  Labs  Latest Reference Range & Units 12/16/23  WBC 4.0 - 10.5 K/uL 11.4 (H)  Hemoglobin 13.0 - 17.0 g/dL 6.3 (L)  HCT 62.9 - 52.8 % 18.5 (L)  Platelets 150 - 400 K/uL 260  Creatinine 0.61 - 1.24 mg/dL 4.13   His dentist is planning extraction of 4 upper and lower teeth No teeth pain He drinks alcohol but says he has not passed out from it or has had known aspiration or vomitus, but EPIC records show mulitple visits to ED for alcohol intoxication , seizure from withdrawal    Past Medical History:  Diagnosis Date   Alcohol abuse    HTN (hypertension)    Neuropathy    Pancreatitis    Peptic ulcer disease     Past Surgical History:  Procedure Laterality Date   ANTERIOR CERVICAL DECOMP/DISCECTOMY FUSION N/A 04/02/2022   Procedure: C3-6 ANTERIOR CERVICAL DECOMPRESSION/DISCECTOMY FUSION 3 LEVELS;  Surgeon: Venetia Night, MD;  Location: [facility] ORS;  Service: Neurosurgery;  Laterality: N/A;   APPENDECTOMY     INCISION AND DRAINAGE Left 11/11/2018   Procedure: INCISION AND DRAINAGE;  Surgeon: Donato Heinz, MD;  Location: [facility];  Service: Orthopedics;  Laterality: Left;   LAPAROSCOPIC APPENDECTOMY N/A 07/06/2018   Procedure: APPENDECTOMY LAPAROSCOPIC;  Surgeon: Carolan Shiver, MD;  Location: [facility] OR[facility];  Service: General;  Laterality: N/A;    Social History    Socioeconomic History   Marital status: Single    Spouse name: Not on file   Number of children: Not on file   Years of education: Not on file   Highest education level: Not on file  Occupational History   Not on file  Tobacco Use   Smoking status: Every Day    Current packs/day: 0.50    Types: Cigarettes   Smokeless tobacco: Never  Vaping Use   Vaping status: Never Used  Substance and Sexual Activity   Alcohol use: Yes    Comment: daily 40 oz, reports 4 40oz/day   Drug use: No   Sexual activity: Not on file  Other Topics Concern   Not on file  Social History Narrative   Not on file   Social Drivers of Health   Financial Resource Strain: Not on file  Food Insecurity: No Food Insecurity (04/23/2023)   Hunger Vital Sign    Worried About Running Out of Food in the Last Year: Never true    Ran Out of Food in the Last Year: Never true  Transportation Needs: No Transportation Needs (04/23/2023)   PRAPARE - Administrator, Civil Service (Medical): No    Lack of Transportation (Non-Medical): No  Physical Activity: Not on file  Stress: Not on file  Social Connections: Not on file  Intimate Partner Violence: Not At Risk (04/23/2023)  Humiliation, Afraid, Rape, and Kick questionnaire    Fear of Current or Ex-Partner: No    Emotionally Abused: No    Physically Abused: No    Sexually Abused: No    Family History  Problem Relation Age of Onset   Cataracts Mother    No Known Allergies I? Current Facility-Administered Medications  Medication Dose Route Frequency Provider Last Rate Last Admin   acetaminophen (TYLENOL) tablet 650 mg  650 mg Oral Q6H PRN Andris Baumann, MD       Or   acetaminophen (TYLENOL) suppository 650 mg  650 mg Rectal Q6H PRN Andris Baumann, MD       albuterol (PROVENTIL) (2.5 MG/3ML) 0.083% nebulizer solution 2.5 mg  2.5 mg Inhalation Q6H PRN Andris Baumann, MD       Ampicillin-Sulbactam (UNASYN) 3 g in sodium chloride 0.9 % 100 mL IVPB   3 g Intravenous Q6H Andris Baumann, MD   Stopped at 12/17/23 1411   chlordiazePOXIDE (LIBRIUM) capsule 25 mg  25 mg Oral TID Lurene Shadow, MD       folic acid (FOLVITE) tablet 1 mg  1 mg Oral Daily Lindajo Royal V, MD   1 mg at 12/17/23 0919   LORazepam (ATIVAN) injection 0-4 mg  0-4 mg Intravenous Q6H Andris Baumann, MD       Followed by   Melene Muller ON 12/19/2023] LORazepam (ATIVAN) injection 0-4 mg  0-4 mg Intravenous Q12H Andris Baumann, MD       LORazepam (ATIVAN) tablet 1-4 mg  1-4 mg Oral Q1H PRN Andris Baumann, MD       Or   LORazepam (ATIVAN) injection 1-4 mg  1-4 mg Intravenous Q1H PRN Andris Baumann, MD       multivitamin with minerals tablet 1 tablet  1 tablet Oral Daily Andris Baumann, MD   1 tablet at 12/17/23 0919   nicotine (NICODERM CQ - dosed in mg/24 hours) patch 14 mg  14 mg Transdermal Daily Andris Baumann, MD       ondansetron [facility]) tablet 4 mg  4 mg Oral Q6H PRN Andris Baumann, MD       Or   ondansetron [facility]) injection 4 mg  4 mg Intravenous Q6H PRN Andris Baumann, MD       pantoprazole (PROTONIX) injection 40 mg  40 mg Intravenous Q24H Lindajo Royal V, MD   40 mg at 12/17/23 1313   thiamine (VITAMIN B1) tablet 100 mg  100 mg Oral Daily Andris Baumann, MD   100 mg at 12/17/23 9528   Or   thiamine (VITAMIN B1) injection 100 mg  100 mg Intravenous Daily Andris Baumann, MD       Current Outpatient Medications  Medication Sig Dispense Refill   albuterol (VENTOLIN HFA) 108 (90 Base) MCG/ACT inhaler Inhale 2 puffs into the lungs every 6 (six) hours as needed for wheezing or shortness of breath. (Patient not taking: Reported on 12/17/2023) 18 g 0   amLODipine (NORVASC) 2.5 MG tablet Take 1 tablet (2.5 mg total) by mouth daily. (Patient not taking: Reported on 12/17/2023) 30 tablet 0   folic acid (FOLVITE) 1 MG tablet Take 1 tablet (1 mg total) by mouth daily. (Patient not taking: Reported on 12/17/2023) 30 tablet 0   gabapentin (NEURONTIN) 100 MG capsule Take 1  capsule (100 mg total) by mouth 3 (three) times daily. 90 capsule 0   LORazepam (ATIVAN) 0.5 MG tablet One tab po  every eight hours for two days then one tab po twice a day for two days then one tab po daily for three days (Patient not taking: Reported on 12/17/2023) 13 tablet 0   Multiple Vitamin (MULTIVITAMIN WITH MINERALS) TABS tablet Take 1 tablet by mouth daily.     nicotine (NICODERM CQ - DOSED IN MG/24 HOURS) 14 mg/24hr patch One 14 mg patch chest wall daily (okay to substitute generic) (Patient not taking: Reported on 12/17/2023) 28 patch 0   thiamine (VITAMIN B-1) 100 MG tablet Take 1 tablet (100 mg total) by mouth daily. (Patient not taking: Reported on 12/17/2023) 30 tablet 0     Abtx:  Anti-infectives (From admission, onward)    Start     Dose/Rate Route Frequency Ordered Stop   12/17/23 0700  Ampicillin-Sulbactam (UNASYN) 3 g in sodium chloride 0.9 % 100 mL IVPB        3 g 200 mL/hr over 30 Minutes Intravenous Every 6 hours 12/17/23 0248     12/17/23 0030  Ampicillin-Sulbactam (UNASYN) 3 g in sodium chloride 0.9 % 100 mL IVPB        3 g 200 mL/hr over 30 Minutes Intravenous  Once 12/17/23 0021 12/17/23 0138       REVIEW OF SYSTEMS:  Const: feels flushed, nocturnal sweats,  chills, weight loss Eyes: negative diplopia or visual changes, negative eye pain ENT: negative coryza, negative sore throat Resp:  cough, productive of mucoid sputum no hemoptysis, has dyspnea Cards: negative for chest pain, palpitations, lower extremity edema GU: negative for frequency, dysuria and hematuria GI: Negative for abdominal pain, diarrhea, bleeding, constipation Skin: negative for rash and pruritus Heme: negative for easy bruising and gum/nose bleeding MS: general weakness Neurolo:negative for headaches, dizziness, vertigo, memory problems  Psych: negative for feelings of anxiety, depression  Endocrine: negative for thyroid, diabetes Allergy/Immunology- as above: Objective:  VITALS:  BP  134/83   Pulse 84   Temp 98.4 F (36.9 C) (Oral)   Resp 18   SpO2 100%   PHYSICAL EXAM:  General: Alert, cooperative, no distress, chronically ill, pale.  Head: Normocephalic, without obvious abnormality, atraumatic. Eyes: Conjunctivae clear, anicteric sclerae. Pupils are equal ENT Nares normal. No drainage or sinus tenderness. Lips, mucosa, and tongue normal. No Thrush Dental hygiene poor  Neck: Supple, symmetrical, no adenopathy, thyroid: non tender no carotid bruit and no JVD. Back: No CVA tenderness. Lungs:b/l air entry- crepts rt side Heart: Regular rate and rhythm, no murmur, rub or gallop. Abdomen: Soft, non-tender,not distended. Bowel sounds normal. No masses Extremities: atraumatic, no cyanosis. No edema. No clubbing Skin: No rashes or lesions. Or bruising Lymph: Cervical, supraclavicular normal. Neurologic: Grossly non-focal Pertinent Labs Lab Results CBC    Component Value Date/Time   WBC 11.4 (H) 12/16/2023 2216   RBC 2.05 (L) 12/16/2023 2216   HGB 6.3 (L) 12/16/2023 2216   HGB 13.4 01/18/2014 1606   HCT 18.5 (L) 12/16/2023 2216   HCT 40.4 01/18/2014 1606   PLT 260 12/16/2023 2216   PLT 122 (L) 01/18/2014 1606   MCV 90.2 12/16/2023 2216   MCV 93 01/18/2014 1606   MCH 30.7 12/16/2023 2216   MCHC 34.1 12/16/2023 2216   RDW 12.6 12/16/2023 2216   RDW 13.9 01/18/2014 1606   LYMPHSABS 3.6 12/16/2023 2216   MONOABS 0.9 12/16/2023 2216   EOSABS 0.5 12/16/2023 2216   BASOSABS 0.1 12/16/2023 2216       Latest Ref Rng & Units 12/16/2023   10:16 PM 06/05/2023  10:16 PM 04/25/2023    4:56 AM  CMP  Glucose 70 - 99 mg/dL 89  84  846   BUN 6 - 20 mg/dL 5  10  7    Creatinine 0.61 - 1.24 mg/dL 9.62  9.52  8.41   Sodium 135 - 145 mmol/L 130  134  137   Potassium 3.5 - 5.1 mmol/L 3.8  4.2  3.7   Chloride 98 - 111 mmol/L 101  97  106   CO2 22 - 32 mmol/L 18  15  24    Calcium 8.9 - 10.3 mg/dL 8.3  9.0  8.8   Total Protein 6.5 - 8.1 g/dL 6.5  7.9    Total  Bilirubin 0.0 - 1.2 mg/dL 0.4  0.8    Alkaline Phos 38 - 126 U/L 66  151    AST 15 - 41 U/L 21  170    ALT 0 - 44 U/L 9  57        Microbiology: Recent Results (from the past 240 hours)  Culture, blood (Routine x 2)     Status: None (Preliminary result)   Collection Time: 12/16/23 10:17 PM   Specimen: BLOOD  Result Value Ref Range Status   Specimen Description BLOOD RIGHT WRIST  Final   Special Requests   Final    BOTTLES DRAWN AEROBIC AND ANAEROBIC Blood Culture results may not be optimal due to an inadequate volume of blood received in culture bottles   Culture   Final    NO GROWTH < 12 HOURS Performed at [facility], [address], [place], [place] [address]    Report Status PENDING  Incomplete  Culture, blood (Routine x 2)     Status: None (Preliminary result)   Collection Time: 12/16/23 10:17 PM   Specimen: BLOOD  Result Value Ref Range Status   Specimen Description BLOOD RIGHT HAND  Final   Special Requests   Final    BOTTLES DRAWN AEROBIC AND ANAEROBIC Blood Culture results may not be optimal due to an inadequate volume of blood received in culture bottles   Culture   Final    NO GROWTH < 12 HOURS Performed at [facility], [address] [place], [place] [address]    Report Status PENDING  Incomplete  Resp panel by RT-PCR (RSV, Flu A&B, Covid) Anterior Nasal Swab     Status: None   Collection Time: 12/16/23 10:19 PM   Specimen: Anterior Nasal Swab  Result Value Ref Range Status   SARS Coronavirus 2 by RT PCR NEGATIVE NEGATIVE Final    Comment: (NOTE) SARS-CoV-2 target nucleic acids are NOT DETECTED.  The SARS-CoV-2 RNA is generally detectable in upper respiratory specimens during the acute phase of infection. The lowest concentration of SARS-CoV-2 viral copies this assay can detect is 138 copies/mL. A negative result does not preclude SARS-Cov-2 infection and should not be used as the sole basis for treatment or other patient management  decisions. A negative result may occur with  improper specimen collection/handling, submission of specimen other than nasopharyngeal swab, presence of viral mutation(s) within the areas targeted by this assay, and inadequate number of viral copies(<138 copies/mL). A negative result must be combined with clinical observations, patient history, and epidemiological information. The expected result is Negative.  Fact Sheet for Patients:  BloggerCourse.com  Fact Sheet for Healthcare Providers:  SeriousBroker.it  This test is no t yet approved or cleared by the [place] FDA and  has been authorized for detection and/or  diagnosis of SARS-CoV-2 by FDA under an Emergency Use Authorization (EUA). This EUA will remain  in effect (meaning this test can be used) for the duration of the COVID-19 declaration under Section 564(b)(1) of the Act, 21 U.S.C.section 360bbb-3(b)(1), unless the authorization is terminated  or revoked sooner.       Influenza A by PCR NEGATIVE NEGATIVE Final   Influenza B by PCR NEGATIVE NEGATIVE Final    Comment: (NOTE) The Xpert Xpress SARS-CoV-2/FLU/RSV plus assay is intended as an aid in the diagnosis of influenza from Nasopharyngeal swab specimens and should not be used as a sole basis for treatment. Nasal washings and aspirates are unacceptable for Xpert Xpress SARS-CoV-2/FLU/RSV testing.  Fact Sheet for Patients: BloggerCourse.com  Fact Sheet for Healthcare Providers: SeriousBroker.it  This test is not yet approved or cleared by the [place] FDA and has been authorized for detection and/or diagnosis of SARS-CoV-2 by FDA under an Emergency Use Authorization (EUA). This EUA will remain in effect (meaning this test can be used) for the duration of the COVID-19 declaration under Section 564(b)(1) of the Act, 21 U.S.C. section 360bbb-3(b)(1), unless the  authorization is terminated or revoked.     Resp Syncytial Virus by PCR NEGATIVE NEGATIVE Final    Comment: (NOTE) Fact Sheet for Patients: BloggerCourse.com  Fact Sheet for Healthcare Providers: SeriousBroker.it  This test is not yet approved or cleared by the [place] FDA and has been authorized for detection and/or diagnosis of SARS-CoV-2 by FDA under an Emergency Use Authorization (EUA). This EUA will remain in effect (meaning this test can be used) for the duration of the COVID-19 declaration under Section 564(b)(1) of the Act, 21 U.S.C. section 360bbb-3(b)(1), unless the authorization is terminated or revoked.  Performed at [facility], [address], [place], [place] [address]     IMAGING RESULTS:  I have personally reviewed the films Cavitary abscess rt upper lobe extending to lower lobe  Impression/Recommendation 48 year old male with history of alcohol abuse, previous left lung abscess negative for AFB, ACDF C3-C6 presents with cough of more than a months duration productive of sputum and shortness of breath of a few days duration worse in the recent 48 hours.  Nocturnal sweats and weakness  Cavitary pneumonia on the right upper lobe extending to the lower lobe There is a thick-walled cavity very likely related to alcohol use aspiration and necrotizing pneumonia leading out to a cavitary lesion.  The differential diagnosis would be tuberculosis especially with his alcohol use may need to rule out TB. Atypical mycobacteria's possible Will keep him in airborne isolation and sent 3 sputums for AFB Patient is currently on Unasyn.  Change to Zosyn so as to cover Klebsiella which she is at high risk for.  Thought other organisms are likely Streptococcus and anaerobes  Anemia  Hyponatremia secondary to the lung lesion likely  History of ACDF C3-C6 ? I have personally spent  -75--minutes involved in  face-to-face and non-face-to-face activities for this patient on the day of the visit. Professional time spent includes the following activities: Preparing to see the patient (review of tests), Obtaining and/or reviewing separately obtained history (admission/discharge record), Performing a medically appropriate examination and/or evaluation , Ordering medications/tests/procedures, referring and communicating with other health care professionals, Documenting clinical information in the EMR, Independently interpreting results (not separately reported), Communicating results to the patient/family/caregiver, Counseling and educating the patient/family/caregiver and Care coordination (not separately reported).    ________________________________________________  Assessment involved complicated antimicrobial treatment and counseling, and patient  prevention practices including airborne  Note:  This document was prepared using Conservation officer, historic buildings and may include unintentional dictation errors.

## 2023-12-17 NOTE — Assessment & Plan Note (Signed)
 Will continue with Unasyn ordered from the ED Antitussives, DuoNebs as needed Supplemental oxygen if needed Follow QuantiFERON gold AFB sputum x 3 Incentive spirometer SLP consult Consider ID consult

## 2023-12-17 NOTE — Progress Notes (Addendum)
 Progress Note    Lance Green  KGM:[id number] DOB: 13-Nov-1975  DOA: 12/16/2023 PCP: [facility]      Brief Narrative:    Medical records reviewed and are as summarized below:  Lance Green is a 48 y.o. male with medical history significant for hypertension, history of pancreatitis, history of peptic ulcer disease, tobacco use disorder, alcohol use disorder, history of alcohol withdrawal seizure in July 2024, who presented to the hospital with 1 month history of cough, shortness of breath, general weakness, night sweats and weight loss of about 10 pounds.  Diagnostic data significant for hemoglobin of 6.3 and CT chest concerning for right upper lobe cavitary necrotizing pneumonia.      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   Right upper lobe cavitary, necrotizing pneumonia: Continue IV Unasyn.  Check Fungitell, Legionella urine antigen, strep urine antigen.  Check MRSA screen.  Sputum for MTB RIF is pending.  QuantiFERON-TB gold is pending.  Consulted Dr. Rivka Safer, ID specialist and Dr. Karna Christmas, pulmonologist, to assist with management. Placed on airborne precautions   Severe anemia, positive heme stools: Hemoglobin was 6.3 on admission.  S/p transfusion with 1 unit of PRBCs on 12/16/2023.  Repeat CBC.   Check iron studies, vitamin B12 and folate levels. No overt GI bleeding. Continue IV Protonix. Follow-up with with gastroenterologist for further recommendations.   Alcohol use disorder: Start Librium.  He is on Ativan as needed per CIWA protocol.    Tobacco use disorder: Nicotine patch.   History of hypertension: BP is on the low side.  Hold amlodipine   Comorbidities include history of peptic ulcer  disease, history of pancreatitis, history of alcohol withdrawal seizure   Diet Order             DIET SOFT Room service appropriate? Yes; Fluid consistency: Thin  Diet effective now                            Consultants: ID specialist Gastroenterologist  Procedures: None    Medications:    folic acid  1 mg Oral Daily   LORazepam  0-4 mg Intravenous Q6H   Followed by   Melene Muller ON 12/19/2023] LORazepam  0-4 mg Intravenous Q12H   multivitamin with minerals  1 tablet Oral Daily   nicotine  14 mg Transdermal Daily   pantoprazole (PROTONIX) IV  40 mg Intravenous Q24H   thiamine  100 mg Oral Daily   Or   thiamine  100 mg Intravenous Daily   Continuous Infusions:  ampicillin-sulbactam (UNASYN) IV Stopped (12/17/23 1411)     Anti-infectives (From admission, onward)    Start     Dose/Rate Route Frequency Ordered Stop   12/17/23 0700  Ampicillin-Sulbactam (UNASYN) 3 g in sodium chloride 0.9 % 100 mL IVPB        3 g 200 mL/hr over 30 Minutes Intravenous Every 6 hours 12/17/23 0248     12/17/23 0030  Ampicillin-Sulbactam (UNASYN) 3 g in sodium chloride 0.9 % 100 mL IVPB        3 g 200 mL/hr over 30 Minutes Intravenous  Once 12/17/23 0021 12/17/23 0138  Family Communication/Anticipated D/C date and plan/Code Status   DVT prophylaxis: SCDs Start: 12/17/23 0247     Code Status: Full Code  Family Communication: None Disposition Plan: Plan to discharge home   Status is: Inpatient Remains inpatient appropriate because: Cavitary pneumonia       Subjective:   Interval events noted.  He complains of cough and night sweats.  He says symptoms are not going on for about a month.  He has been having chills.  He is not sure about fever because he never checked his temperature at home.  He is also lost about 10 pounds in a month.  No shortness of breath or chest pain. Devyn, RN, was at the bedside  Objective:    Vitals:   12/17/23 0900  12/17/23 1032 12/17/23 1319 12/17/23 1400  BP: 95/65 103/71  106/67  Pulse: 98 97  92  Resp:  (!) 26  (!) 26  Temp:   98.4 F (36.9 C)   TempSrc:   Oral   SpO2:  98%  99%   No data found.   Intake/Output Summary (Last 24 hours) at 12/17/2023 1510 Last data filed at 12/17/2023 0651 Gross per 24 hour  Intake 294 ml  Output --  Net 294 ml   There were no vitals filed for this visit.  Exam:  GEN: NAD SKIN: Warm and dry EYES: No pallor or icterus ENT: MMM CV: RRR PULM: Rales heard in the right upper hemithorax.  No wheezing. ABD: soft, ND, NT, +BS CNS: AAO x 3, non focal EXT: No edema or tenderness        Data Reviewed:   I have personally reviewed following labs and imaging studies:  Labs: Labs show the following:   Basic Metabolic Panel: Recent Labs  Lab 12/16/23 2216  NA 130*  K 3.8  CL 101  CO2 18*  GLUCOSE 89  BUN 5*  CREATININE 0.70  CALCIUM 8.3*   GFR CrCl cannot be calculated (Unknown ideal weight.). Liver Function Tests: Recent Labs  Lab 12/16/23 2216  AST 21  ALT 9  ALKPHOS 66  BILITOT 0.4  PROT 6.5  ALBUMIN 2.2*   No results for input(s): "LIPASE", "AMYLASE" in the last 168 hours. No results for input(s): "AMMONIA" in the last 168 hours. Coagulation profile Recent Labs  Lab 12/16/23 2216  INR 1.1    CBC: Recent Labs  Lab 12/16/23 2216  WBC 11.4*  NEUTROABS 6.4  HGB 6.3*  HCT 18.5*  MCV 90.2  PLT 260   Cardiac Enzymes: No results for input(s): "CKTOTAL", "CKMB", "CKMBINDEX", "TROPONINI" in the last 168 hours. BNP (last 3 results) No results for input(s): "PROBNP" in the last 8760 hours. CBG: No results for input(s): "GLUCAP" in the last 168 hours. D-Dimer: No results for input(s): "DDIMER" in the last 72 hours. Hgb A1c: No results for input(s): "HGBA1C" in the last 72 hours. Lipid Profile: No results for input(s): "CHOL", "HDL", "LDLCALC", "TRIG", "CHOLHDL", "LDLDIRECT" in the last 72 hours. Thyroid function  studies: No results for input(s): "TSH", "T4TOTAL", "T3FREE", "THYROIDAB" in the last 72 hours.  Invalid input(s): "FREET3" Anemia work up: No results for input(s): "VITAMINB12", "FOLATE", "FERRITIN", "TIBC", "IRON", "RETICCTPCT" in the last 72 hours. Sepsis Labs: Recent Labs  Lab 12/16/23 2216 12/17/23 0040  WBC 11.4*  --   LATICACIDVEN 1.6 1.4    Microbiology Recent Results (from the past 240 hours)  Culture, blood (Routine x 2)     Status: None (Preliminary result)   Collection  Time: 12/16/23 10:17 PM   Specimen: BLOOD  Result Value Ref Range Status   Specimen Description BLOOD RIGHT WRIST  Final   Special Requests   Final    BOTTLES DRAWN AEROBIC AND ANAEROBIC Blood Culture results may not be optimal due to an inadequate volume of blood received in culture bottles   Culture   Final    NO GROWTH < 12 HOURS Performed at [facility], [address] [place], [place] [address]    Report Status PENDING  Incomplete  Culture, blood (Routine x 2)     Status: None (Preliminary result)   Collection Time: 12/16/23 10:17 PM   Specimen: BLOOD  Result Value Ref Range Status   Specimen Description BLOOD RIGHT HAND  Final   Special Requests   Final    BOTTLES DRAWN AEROBIC AND ANAEROBIC Blood Culture results may not be optimal due to an inadequate volume of blood received in culture bottles   Culture   Final    NO GROWTH < 12 HOURS Performed at [facility], [address], [place], [place] [address]    Report Status PENDING  Incomplete  Resp panel by RT-PCR (RSV, Flu A&B, Covid) Anterior Nasal Swab     Status: None   Collection Time: 12/16/23 10:19 PM   Specimen: Anterior Nasal Swab  Result Value Ref Range Status   SARS Coronavirus 2 by RT PCR NEGATIVE NEGATIVE Final    Comment: (NOTE) SARS-CoV-2 target nucleic acids are NOT DETECTED.  The SARS-CoV-2 RNA is generally detectable in upper respiratory specimens during the acute phase of infection. The  lowest concentration of SARS-CoV-2 viral copies this assay can detect is 138 copies/mL. A negative result does not preclude SARS-Cov-2 infection and should not be used as the sole basis for treatment or other patient management decisions. A negative result may occur with  improper specimen collection/handling, submission of specimen other than nasopharyngeal swab, presence of viral mutation(s) within the areas targeted by this assay, and inadequate number of viral copies(<138 copies/mL). A negative result must be combined with clinical observations, patient history, and epidemiological information. The expected result is Negative.  Fact Sheet for Patients:  BloggerCourse.com  Fact Sheet for Healthcare Providers:  SeriousBroker.it  This test is no t yet approved or cleared by the [place] FDA and  has been authorized for detection and/or diagnosis of SARS-CoV-2 by FDA under an Emergency Use Authorization (EUA). This EUA will remain  in effect (meaning this test can be used) for the duration of the COVID-19 declaration under Section 564(b)(1) of the Act, 21 U.S.C.section 360bbb-3(b)(1), unless the authorization is terminated  or revoked sooner.       Influenza A by PCR NEGATIVE NEGATIVE Final   Influenza B by PCR NEGATIVE NEGATIVE Final    Comment: (NOTE) The Xpert Xpress SARS-CoV-2/FLU/RSV plus assay is intended as an aid in the diagnosis of influenza from Nasopharyngeal swab specimens and should not be used as a sole basis for treatment. Nasal washings and aspirates are unacceptable for Xpert Xpress SARS-CoV-2/FLU/RSV testing.  Fact Sheet for Patients: BloggerCourse.com  Fact Sheet for Healthcare Providers: SeriousBroker.it  This test is not yet approved or cleared by the [place] FDA and has been authorized for detection and/or diagnosis of SARS-CoV-2 by FDA under  an Emergency Use Authorization (EUA). This EUA will remain in effect (meaning this test can be used) for the duration of the COVID-19 declaration under Section 564(b)(1) of the Act, 21 U.S.C. section 360bbb-3(b)(1), unless the  authorization is terminated or revoked.     Resp Syncytial Virus by PCR NEGATIVE NEGATIVE Final    Comment: (NOTE) Fact Sheet for Patients: BloggerCourse.com  Fact Sheet for Healthcare Providers: SeriousBroker.it  This test is not yet approved or cleared by the [place] FDA and has been authorized for detection and/or diagnosis of SARS-CoV-2 by FDA under an Emergency Use Authorization (EUA). This EUA will remain in effect (meaning this test can be used) for the duration of the COVID-19 declaration under Section 564(b)(1) of the Act, 21 U.S.C. section 360bbb-3(b)(1), unless the authorization is terminated or revoked.  Performed at [facility], [address] [place], [place] [address]     Procedures and diagnostic studies:  CT Chest W Contrast Result Date: 12/17/2023 CLINICAL DATA:  Cough, fever, dyspnea EXAM: CT CHEST WITH CONTRAST TECHNIQUE: Multidetector CT imaging of the chest was performed during intravenous contrast administration. RADIATION DOSE REDUCTION: This exam was performed according to the departmental dose-optimization program which includes automated exposure control, adjustment of the mA and/or kV according to patient size and/or use of iterative reconstruction technique. CONTRAST:  75mL OMNIPAQUE IOHEXOL 300 MG/ML  SOLN COMPARISON:  03/31/2021 FINDINGS: Cardiovascular: No significant vascular findings. Normal heart size. No pericardial effusion. Mediastinum/Nodes: Visualized thyroid is unremarkable. There is right hilar adenopathy with multiple lymph nodes measuring up to 12-13 mm in short axis diameter possibly reactive. The esophagus is unremarkable. Lungs/Pleura: There is  extensive cavitation within the right upper lobe with a thick walled cavitary lesion replacing the posterior segment of the right upper lobe, crossing the major fissure, and extending into the superior segment of the right lower lobe. The violation of the fissure favors a infectious process in keeping with necrotizing pneumonia, though neoplasm is not completely excluded. Note that granulomatous infections could appear similarly, as can be seen with mycobacterial infection. Surrounding ground-glass inflammatory infiltrate is seen within the right upper lobe. Air-fluid levels noted within the cavitary lesion in keeping with acute inflammation. There is bronchial wall thickening present in keeping with airway inflammation. No pneumothorax or pleural effusion. Previously noted cavitary lesion within the left upper lobe has nearly resolved a small amount of residual parenchymal scarring. Upper Abdomen: No acute abnormality. Changes of chronic pancreatitis are again identified. Musculoskeletal: No chest wall abnormality. No acute or significant osseous findings. IMPRESSION: 1. Extensive cavitation within the right upper lobe with a thick walled cavitary lesion replacing the posterior segment of the right upper lobe, crossing the major fissure, and extending into the superior segment of the right lower lobe. Favor necrotizing pneumonia, though neoplasm is not completely excluded. Note that granulomatous infections could appear similarly, as can be seen with mycobacterial infection, which should be excluded. Follow-up imaging is recommended after appropriate therapy to document resolution. 2. Right hilar adenopathy, possibly reactive. 3. Near complete resolution of previously noted cavitary lesion within the left upper lobe with a small amount of residual parenchymal scarring. 4. Changes of chronic pancreatitis. Electronically Signed   By: Helyn Numbers M.D.   On: 12/17/2023 01:47   DG Chest Portable 1 View Result  Date: 12/16/2023 CLINICAL DATA:  Cough cough, fever, shortness of breath. EXAM: PORTABLE CHEST 1 VIEW COMPARISON:  05/17/2023 FINDINGS: Opacity abutting the fissure. Question of cavitary component. The left lung is clear. Is normal in size. No significant pleural effusion. No pulmonary edema. IMPRESSION: Opacity abutting the fissure, suspicious for pneumonia. Question of cavitary component. Recommend CT for further assessment. Electronically Signed   By: Ivette Loyal.D.  On: 12/16/2023 23:48               LOS: 0 days   Sanay Belmar  [facility]   Pager on [email or web address]. If 7PM-7AM, please contact night-coverage at [email or web address]     12/17/2023, 3:10 PM         \

## 2023-12-17 NOTE — Assessment & Plan Note (Addendum)
History of alcohol withdrawal seizure CIWA withdrawal protocol

## 2023-12-17 NOTE — Assessment & Plan Note (Signed)
 Nicotine patch

## 2023-12-17 NOTE — ED Notes (Signed)
 Report given to RN, Davonna Belling

## 2023-12-17 NOTE — ED Notes (Signed)
Secretary called for pt transport.

## 2023-12-17 NOTE — Consult Note (Signed)
 PULMONOLOGY         Date: 12/17/2023,   MRN# [id number] Lance Green 05-07-1976     Admission                  Current   Referring provider: Dr Myriam Forehand   CHIEF COMPLAINT:   Dyspnea and cavitary lung disease    HISTORY OF PRESENT ILLNESS    Lance Green is a 48 y.o. male with medical history significant for essential HTN,  tobacco use, alcohol use disorder with history of an alcohol withdrawal seizure in July 2024, who presented to the ED with 1 month history of cough, shortness of breath and fever and generalized malaise.  Denies chest pain, leg pain or swelling, vomiting or diarrhea. He was noted to be tachycardic and tachypneic on arrival to ER. Labs notable for hemoglobin 6.3, down from 12.6 about 6 months prior, WBC 11.4 with lactic acid 1.6 Sodium 130, bicarb 18 Urinalysis unremarkable COVID flu and RSV negative.  tool for occult blood positive CT chest showing a cavitary lesion right upper lobe-favor necrotizing pneumonia though neoplasm not completely excluded EKG, with mild ischemic changes per my read.  Patient treated with Unasyn, IV Protonix and 1 unit PRBC ordered QuantiFERON gold ordered.  He was noted to have cavitary lung lesion and PCCM is consulted for further evaluation.      PAST MEDICAL HISTORY   Past Medical History:  Diagnosis Date   Alcohol abuse    HTN (hypertension)    Neuropathy    Pancreatitis    Peptic ulcer disease      SURGICAL HISTORY   Past Surgical History:  Procedure Laterality Date   ANTERIOR CERVICAL DECOMP/DISCECTOMY FUSION N/A 04/02/2022   Procedure: C3-6 ANTERIOR CERVICAL DECOMPRESSION/DISCECTOMY FUSION 3 LEVELS;  Surgeon: Venetia Night, MD;  Location: [facility] ORS;  Service: Neurosurgery;  Laterality: N/A;   APPENDECTOMY     INCISION AND DRAINAGE Left 11/11/2018   Procedure: INCISION AND DRAINAGE;  Surgeon: Donato Heinz, MD;  Location: [facility] ORS;  Service: Orthopedics;  Laterality: Left;   LAPAROSCOPIC  APPENDECTOMY N/A 07/06/2018   Procedure: APPENDECTOMY LAPAROSCOPIC;  Surgeon: Carolan Shiver, MD;  Location: [facility] ORS;  Service: General;  Laterality: N/A;     FAMILY HISTORY   Family History  Problem Relation Age of Onset   Cataracts Mother      SOCIAL HISTORY   Social History   Tobacco Use   Smoking status: Every Day    Current packs/day: 0.50    Types: Cigarettes   Smokeless tobacco: Never  Vaping Use   Vaping status: Never Used  Substance Use Topics   Alcohol use: Yes    Comment: daily 40 oz, reports 4 40oz/day   Drug use: No     MEDICATIONS    Home Medication:  Current Outpatient Rx   Order #: [id number] Class: Normal   Order #: [id number] Class: Normal   Order #: [id number] Class: Normal   Order #: [id number] Class: Normal   Order #: [id number] Class: Normal   Order #: [id number] Class: OTC   Order #: [id number] Class: Normal   Order #: [id number] Class: Normal    Current Medication:  Current Facility-Administered Medications:    acetaminophen (TYLENOL) tablet 650 mg, 650 mg, Oral, Q6H PRN **OR** acetaminophen (TYLENOL) suppository 650 mg, 650 mg, Rectal, Q6H PRN, Andris Baumann, MD   albuterol (PROVENTIL) (2.5 MG/3ML) 0.083% nebulizer solution 2.5 mg, 2.5 mg, Inhalation, Q6H PRN, Andris Baumann, MD   chlordiazePOXIDE (LIBRIUM) capsule 25 mg,  25 mg, Oral, TID, Lurene Shadow, MD, 25 mg at 12/17/23 1709   folic acid (FOLVITE) tablet 1 mg, 1 mg, Oral, Daily, Lindajo Royal V, MD, 1 mg at 12/17/23 0919   LORazepam (ATIVAN) injection 0-4 mg, 0-4 mg, Intravenous, Q6H **FOLLOWED BY** [START ON 12/19/2023] LORazepam (ATIVAN) injection 0-4 mg, 0-4 mg, Intravenous, Q12H, Para March, Odetta Pink, MD   LORazepam (ATIVAN) tablet 1-4 mg, 1-4 mg, Oral, Q1H PRN **OR** LORazepam (ATIVAN) injection 1-4 mg, 1-4 mg, Intravenous, Q1H PRN, Andris Baumann, MD   multivitamin with minerals tablet 1 tablet, 1 tablet, Oral, Daily, Andris Baumann, MD, 1 tablet at 12/17/23 0919   nicotine (NICODERM  CQ - dosed in mg/24 hours) patch 14 mg, 14 mg, Transdermal, Daily, Para March, Odetta Pink, MD   ondansetron (ZOFRAN) tablet 4 mg, 4 mg, Oral, Q6H PRN **OR** ondansetron (ZOFRAN) injection 4 mg, 4 mg, Intravenous, Q6H PRN, Andris Baumann, MD   pantoprazole (PROTONIX) injection 40 mg, 40 mg, Intravenous, Q24H, Lindajo Royal V, MD, 40 mg at 12/17/23 1313   thiamine (VITAMIN B1) tablet 100 mg, 100 mg, Oral, Daily, 100 mg at 12/17/23 0919 **OR** thiamine (VITAMIN B1) injection 100 mg, 100 mg, Intravenous, Daily, Andris Baumann, MD  Current Outpatient Medications:    albuterol (VENTOLIN HFA) 108 (90 Base) MCG/ACT inhaler, Inhale 2 puffs into the lungs every 6 (six) hours as needed for wheezing or shortness of breath. (Patient not taking: Reported on 12/17/2023), Disp: 18 g, Rfl: 0   amLODipine (NORVASC) 2.5 MG tablet, Take 1 tablet (2.5 mg total) by mouth daily. (Patient not taking: Reported on 12/17/2023), Disp: 30 tablet, Rfl: 0   folic acid (FOLVITE) 1 MG tablet, Take 1 tablet (1 mg total) by mouth daily. (Patient not taking: Reported on 12/17/2023), Disp: 30 tablet, Rfl: 0   gabapentin (NEURONTIN) 100 MG capsule, Take 1 capsule (100 mg total) by mouth 3 (three) times daily., Disp: 90 capsule, Rfl: 0   LORazepam (ATIVAN) 0.5 MG tablet, One tab po every eight hours for two days then one tab po twice a day for two days then one tab po daily for three days (Patient not taking: Reported on 12/17/2023), Disp: 13 tablet, Rfl: 0   Multiple Vitamin (MULTIVITAMIN WITH MINERALS) TABS tablet, Take 1 tablet by mouth daily., Disp: , Rfl:    nicotine (NICODERM CQ - DOSED IN MG/24 HOURS) 14 mg/24hr patch, One 14 mg patch chest wall daily (okay to substitute generic) (Patient not taking: Reported on 12/17/2023), Disp: 28 patch, Rfl: 0   thiamine (VITAMIN B-1) 100 MG tablet, Take 1 tablet (100 mg total) by mouth daily. (Patient not taking: Reported on 12/17/2023), Disp: 30 tablet, Rfl: 0    ALLERGIES   Patient has no known  allergies.     REVIEW OF SYSTEMS    Review of Systems:  Gen:  Denies  fever, sweats, chills weigh loss  HEENT: Denies blurred vision, double vision, ear pain, eye pain, hearing loss, nose bleeds, sore throat Cardiac:  No dizziness, chest pain or heaviness, chest tightness,edema Resp:   reports dyspnea chronically  Gi: Denies swallowing difficulty, stomach pain, nausea or vomiting, diarrhea, constipation, bowel incontinence Gu:  Denies bladder incontinence, burning urine Ext:   Denies Joint pain, stiffness or swelling Skin: Denies  skin rash, easy bruising or bleeding or hives Endoc:  Denies polyuria, polydipsia , polyphagia or weight change Psych:   Denies depression, insomnia or hallucinations   Other:  All other systems negative   VS: BP  123/83   Pulse 92   Temp 97.9 F (36.6 C) (Axillary)   Resp 20   SpO2 100%      PHYSICAL EXAM    GENERAL:NAD, no fevers, chills, no weakness no fatigue HEAD: Normocephalic, atraumatic.  EYES: Pupils equal, round, reactive to light. Extraocular muscles intact. No scleral icterus.  MOUTH: Moist mucosal membrane. Dentition intact. No abscess noted.  EAR, NOSE, THROAT: Clear without exudates. No external lesions.  NECK: Supple. No thyromegaly. No nodules. No JVD.  PULMONARY: decreased breath sounds with mild rhonchi worse at bases bilaterally.  CARDIOVASCULAR: S1 and S2. Regular rate and rhythm. No murmurs, rubs, or gallops. No edema. Pedal pulses 2+ bilaterally.  GASTROINTESTINAL: Soft, nontender, nondistended. No masses. Positive bowel sounds. No hepatosplenomegaly.  MUSCULOSKELETAL: No swelling, clubbing, or edema. Range of motion full in all extremities.  NEUROLOGIC: Cranial nerves II through XII are intact. No gross focal neurological deficits. Sensation intact. Reflexes intact.  SKIN: No ulceration, lesions, rashes, or cyanosis. Skin warm and dry. Turgor intact.  PSYCHIATRIC: Mood, affect within normal limits. The patient is awake,  alert and oriented x 3. Insight, judgment intact.       IMAGING     ASSESSMENT/PLAN   Recurrent pneumonia with cavitary lung process    - AFB pCR x 3    - patient known to have seizure disorder and likely has had multiple episodes of aspiration induced pneumonia.  He may benefit from bronchoscopy.     - Gram stain and cultures - sputum     - empiric antibiotics- infectious disease is on case - appreciate input    - procalcitionin    - MRSA pcr     - CRP tren    - ANA comprehensive to rule out AI related cavitary lung disease            Thank you for allowing me to participate in the care of this patient.   Patient/Family are satisfied with care plan and all questions have been answered.    Provider disclosure: Patient with at least one acute or chronic illness or injury that poses a threat to life or bodily function and is being managed actively during this encounter.  All of the below services have been performed independently by signing provider:  review of prior documentation from internal and or external health records.  Review of previous and current lab results.  Interview and comprehensive assessment during patient visit today. Review of current and previous chest radiographs/CT scans. Discussion of management and test interpretation with health care team and patient/family.   This document was prepared using Dragon voice recognition software and may include unintentional dictation errors.     Vida Rigger, M.D.  Division of Pulmonary & Critical Care Medicine

## 2023-12-17 NOTE — Consult Note (Signed)
 GI Inpatient Consult Note  Reason for Consult: Anemia    Attending Requesting Consult: Dr. Para March  History of Present Illness: Lance Green is a 48 y.o. male seen for evaluation of anemia at the request of Dr. Para March. Past medical history significant for HTN,  tobacco use, alcohol use disorder with history of an alcohol withdrawal seizure in July 2024, who presented to the ED with 1 month history of cough, shortness of breath, fever, and generalized malaise. On admission to ED labs remarkable for Hgb 6.3 (down from 12.6 in August 2024). WBC 11.4, lactic acid 1.6, Na 130. COVID Flu & RSV negative. CT chest concerning for necrotizing pneumonia vs. TB vs neoplasm.   Patient reports approximately 1 month ago developing issues with cough, fatigue and diarrhea.  He endorses he tried multiple over-the-counter regimens for the cough.  He endorses that he is having loose stools, can have several loose stools per day, up to 10 per day.  He feels this is a new symptom.  He denies any blood in stool or dark/black loose stools.  He denies any abdominal pain.  He does feel he has had an poor appetite due to the diarrhea and not being able to eat well.  Over the past 1 month he also endorses about a 10 pound weight loss.  He feels occasionally he will have regurgitation if he tries to eat a large meal but denies any frequent nausea vomiting. Endorses early satiety. He is not feeling any GERD symptoms.  He denies any dysphagia.  At baseline smoking half pack per day, more recently smoking 1 cigarette/day since not feeling well.  He also previously was drinking 3 40oz beers per day but has decreased to 1 40oz beer daily since feeling poorly.  He denies Goody's, BC's etc. but has been taking 2 aspirin per day, he believes these to be 81 mg aspirins but unsure. Denies family hx colon polyps or colon cancer. No prior Endoscopic evaluation.  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 12/17/2023   Tobacco use disorder 12/17/2023   Alcohol use disorder 12/17/2023   Neuropathy 04/25/2023   Hepatic steatosis 04/25/2023   Seizure due to alcohol withdrawal (HCC) 04/24/2023   Thrombocytopenia (HCC) 04/24/2023   ETOH abuse 04/23/2023   Transaminitis 04/23/2023   Tobacco abuse counseling 04/23/2023   Dysphagia 04/05/2022   Hypokalemia 04/01/2022   Rhabdomyolysis 04/01/2022   Fall at home, initial encounter 04/01/2022   Cervical cord compression with myelopathy (HCC) 03/28/2022   Cavitary pneumonia 02/20/2021   Sepsis (HCC) 02/20/2021   Normocytic anemia 02/20/2021   Essential hypertension    Alcohol abuse 11/21/2019   Hypomagnesemia 11/21/2019   Abscess of left middle finger 11/10/2018   Acute appendicitis with localized peritonitis 07/06/2018    Past Surgical History: Past Surgical History:  Procedure Laterality Date   ANTERIOR CERVICAL DECOMP/DISCECTOMY FUSION N/A 04/02/2022   Procedure: C3-6 ANTERIOR CERVICAL DECOMPRESSION/DISCECTOMY FUSION 3 LEVELS;  Surgeon: Venetia Night, MD;  Location: [facility] OR[facility];  Service: Neurosurgery;  Laterality: N/A;   APPENDECTOMY     INCISION AND DRAINAGE Left 11/11/2018   Procedure: INCISION AND DRAINAGE;  Surgeon: Donato Heinz, MD;  Location: [facility] ORS;  Service: Orthopedics;  Laterality: Left;   LAPAROSCOPIC APPENDECTOMY N/A 07/06/2018   Procedure: APPENDECTOMY LAPAROSCOPIC;  Surgeon: Carolan Shiver, MD;  Location: [facility] OR[facility];  Service: General;  Laterality: N/A;    Allergies: No Known Allergies  Home Medications: (Not in a hospital admission)  Home medication reconciliation was completed with the patient.   Scheduled Inpatient Medications:     amLODipine  2.5 mg Oral Daily   folic acid  1 mg Oral Daily   LORazepam  0-4 mg Intravenous Q6H   Followed by   Melene Muller ON 12/19/2023] LORazepam  0-4 mg Intravenous Q12H   multivitamin with minerals  1 tablet Oral Daily   nicotine  14 mg Transdermal Daily   pantoprazole (PROTONIX) IV  40 mg Intravenous Q24H   thiamine  100 mg Oral Daily   Or   thiamine  100 mg Intravenous Daily    Continuous Inpatient Infusions:    ampicillin-sulbactam (UNASYN) IV Stopped (12/17/23 0720)    PRN Inpatient Medications:  acetaminophen **OR** acetaminophen, albuterol, LORazepam **OR** LORazepam, ondansetron **OR** ondansetron (ZOFRAN) IV  Family History: family history includes Cataracts in his mother.    Social History:   reports that he has been smoking cigarettes. He has never used smokeless tobacco. He reports current alcohol use. He reports that he does not use drugs.   Review of Systems: Constitutional: + weight loss  Eyes: No changes in vision. ENT: No oral lesions, sore throat.  GI: see HPI.  Heme/Lymph: No easy bruising.  CV: No chest pain.  GU: No hematuria.  Integumentary: No rashes.  Neuro: No headaches.  Psych: No depression/anxiety.  Endocrine: No heat/cold intolerance.  Allergic/Immunologic: No urticaria.  Resp: No cough, SOB.  Musculoskeletal: No joint swelling.    Physical Examination: BP 95/65   Pulse 98   Temp 98.4 F (36.9 C) (Oral)   Resp 19   SpO2 99%  Gen: NAD, alert and oriented x 4 HEENT: PEERLA, EOMI, Neck: supple, no JVD or thyromegaly Chest: CTA bilaterally, no wheezes, crackles, or other adventitious sounds CV: RRR, no m/g/c/r Abd: soft, NT, ND, +BS in all four quadrants; no HSM, guarding, ridigity, or rebound tenderness Ext: no edema, well perfused with 2+ pulses, Skin: no rash or lesions noted Lymph: no LAD  Data: Lab Results  Component Value Date   WBC 11.4 (H) 12/16/2023   HGB 6.3 (L) 12/16/2023   HCT 18.5 (L) 12/16/2023   MCV 90.2  12/16/2023   PLT 260 12/16/2023   Recent Labs  Lab 12/16/23 2216  HGB 6.3*   Lab Results  Component Value Date   NA 130 (L) 12/16/2023   K 3.8 12/16/2023   CL 101 12/16/2023   CO2 18 (L) 12/16/2023   BUN 5 (L) 12/16/2023   CREATININE 0.70 12/16/2023   Lab Results  Component Value Date   ALT 9 12/16/2023   AST 21 12/16/2023   ALKPHOS 66 12/16/2023   BILITOT 0.4 12/16/2023  Albumin 2.2.    Recent Labs  Lab 12/16/23 2216  INR 1.1   [place] 04/2023- IMPRESSION: Mildly increased hepatic echogenicity suggesting hepatic steatosis. Otherwise unremarkable exam.     Assessment/Plan: Mr. Dunwoody is a 48 y.o. male admitted for shortness of breath, found to have significant anemia.   1.  Anemia-Hemoglobin returned at 6.3 (down from baseline 12 in 05/2023), patient denies any specific GI bleeding. He was Hemoccult positive on rectal exam. He has been started on PPI. Ultimately  needs EGD/colonoscopy when he is stable from a respiratory standpoint.  He currently has respiratory workup pending for possible TB, Legionella, etc. and prefer to hold off on cough inducing procedure until more stable from respiratory standpoint.  Will start him on clear liquid diet in case needs to prep with potential colonoscopy inpatient versus outpatient.  Will also need posttransfusion CBC with hemoglobin at least 7 for sedated procedures  2. Alcohol abuse -INR 1.1, has had thrombocytopenia in the past but currently platelets stable. Does have hypoalbuminemia & hyponatremia. LFTs normal this admission.  3.  Diarrhea-x 1 month per patient.  Will add on GI PCR to exclude infectious etiology.  4. SOB 5. Abnormal Chest CT - cavitary pneumonia - primary team monitoring 6. Wt loss  7. Tobacco use  8. HTN   Recommendations: Repeat post transfusion CBC GI PCR  Clear liquid diet  Continue IV PPI Monitor H&H.  Transfusion and resuscitation as per primary team Avoid frequent lab draws to prevent lab induced  anemia Supportive care and antiemetics as per primary team Maintain two sites IV access Avoid nsaids Monitor for GI bleed symptoms  Case was discussed with Dr. Timothy Lasso. Thank you for the consult. Please call with questions or concerns.  Ardelia Mems, PA-C [facility]

## 2023-12-17 NOTE — ED Notes (Signed)
 Lab called and asked to assist in inpatient specimens that were unable to be obtained as pt is difficult stick and his IV s also will not draw. Pt describes a history of needing US guidance.

## 2023-12-17 NOTE — ED Notes (Signed)
 Lab called again regarding specimen collection.

## 2023-12-17 NOTE — Consult Note (Signed)
 Pharmacy Antibiotic Note  Lance Green is a 48 y.o. male admitted on 12/16/2023 with lung abscess.  Pharmacy has been consulted for Zosyn dosing.  Plan: Zosyn 3.375g IV q8h (4 hour infusion).     Temp (24hrs), Avg:98.5 F (36.9 C), Min:97.9 F (36.6 C), Max:98.8 F (37.1 C)  Recent Labs  Lab 12/16/23 2216 12/17/23 0040  WBC 11.4*  --   CREATININE 0.70  --   LATICACIDVEN 1.6 1.4    CrCl cannot be calculated (Unknown ideal weight.).    No Known Allergies  Antimicrobials this admission: Zosyn 3/13 >>  Unasyn 3/12 >> 3/13  Dose adjustments this admission: N/A  Microbiology results: 3/12 BCx: ngtd 3/13 AFB Sputum: pending  3/13 MRSA PCR: ordered  Thank you for allowing pharmacy to be a part of this patient's care.  Barrie Folk 12/17/2023 5:47 PM

## 2023-12-17 NOTE — ED Notes (Signed)
 Lab called due to pt being hard stick

## 2023-12-17 NOTE — Assessment & Plan Note (Signed)
 Stool for occult blood was positive Will keep n.p.o. tonight GI consult Continue IV Protonix Continue transfusion of 1 unit PRBC with serial H&H thereafter

## 2023-12-17 NOTE — Evaluation (Addendum)
 Clinical/Bedside Swallow Evaluation Patient Details  Name: Lance Green MRN: [id number] Date of Birth: 27-Sep-1976  Today's Date: 12/17/2023 Time: SLP Start Time (ACUTE ONLY): 0830 SLP Stop Time (ACUTE ONLY): 0845 SLP Time Calculation (min) (ACUTE ONLY): 15 min  Past Medical History:  Past Medical History:  Diagnosis Date   Alcohol abuse    HTN (hypertension)    Neuropathy    Pancreatitis    Peptic ulcer disease    Past Surgical History:  Past Surgical History:  Procedure Laterality Date   ANTERIOR CERVICAL DECOMP/DISCECTOMY FUSION N/A 04/02/2022   Procedure: C3-6 ANTERIOR CERVICAL DECOMPRESSION/DISCECTOMY FUSION 3 LEVELS;  Surgeon: Lance Night, MD;  Location: [facility] OR[facility];  Service: Neurosurgery;  Laterality: N/A;   APPENDECTOMY     INCISION AND DRAINAGE Left 11/11/2018   Procedure: INCISION AND DRAINAGE;  Surgeon: Lance Heinz, MD;  Location: [facility] OR[facility];  Service: Orthopedics;  Laterality: Left;   LAPAROSCOPIC APPENDECTOMY N/A 07/06/2018   Procedure: APPENDECTOMY LAPAROSCOPIC;  Surgeon: Lance Shiver, MD;  Location: [facility] ORS;  Service: General;  Laterality: N/A;   HPI:  Per H&P "Lance Green is a 48 y.o. male with medical history significant for HTN,  tobacco use, alcohol use disorder with history of an alcohol withdrawal seizure in July 2024, who presented to the ED with 1 month history of cough, shortness of breath and fever and generalized malaise.  Denies chest pain, leg pain or swelling, vomiting or diarrhea." Pt s/p ACDF in 2023. CT Chest, "1. Extensive cavitation within the right upper lobe with a thick walled cavitary lesion replacing the posterior segment of the right upper lobe, crossing the major fissure, and extending into the superior segment of the right lower lobe. Favor necrotizing pneumonia, though neoplasm is not completely excluded. Note that granulomatous infections could appear similarly, as can be seen with mycobacterial infection, which should  be excluded. Follow-up imaging is recommended after appropriate therapy to document resolution. 2. Right hilar adenopathy, possibly reactive. 3. Near complete resolution of previously noted cavitary lesion within the left upper lobe with a small amount of residual parenchymal scarring. 4. Changes of chronic pancreatitis."   Assessment / Plan / Recommendation  Clinical Impression  Pt seen for clinical swallowing evaluation. Pt demonstrated an intact oropharyngeal swallow per clinical assessment. Recommend initiation of a regular diet with thin liquids with standard aspiration precautions. No f/u SLP services indicated at this time. SLP Visit Diagnosis: Dysphagia, unspecified (R13.10)    Aspiration Risk  Mild aspiration risk    Diet Recommendation Regular;Thin liquid    Liquid Administration via: Spoon;Cup;Straw Medication Administration:  (as tolerated) Supervision: Patient able to self feed Compensations: Slow rate Postural Changes: Seated upright at 90 degrees;Remain upright for at least 30 minutes after po intake    Other  Recommendations Recommended Consults:  (GI consult pending) Oral Care Recommendations: Oral care BID    Recommendations for follow up therapy are one component of a multi-disciplinary discharge planning process, led by the attending physician.  Recommendations may be updated based on patient status, additional functional criteria and insurance authorization.  Follow up Recommendations No SLP follow up         Functional Status Assessment Patient has not had a recent decline in their functional status    Swallow Study   General Date of Onset: 12/16/23 HPI: Per H&P "Lance Green is a 48 y.o. male with medical history significant for HTN,  tobacco use, alcohol use disorder with history of an alcohol withdrawal seizure in July 2024,  who presented to the ED with 1 month history of cough, shortness of breath and fever and generalized malaise.  Denies chest  pain, leg pain or swelling, vomiting or diarrhea." Pt s/p ACDF in 2023. Type of Study: Bedside Swallow Evaluation Previous Swallow Assessment: seen following ACDF with post-op dysphagia noted per EMR; however, pt reports complete resolution Diet Prior to this Study: NPO Temperature Spikes Noted: No Respiratory Status: Room air History of Recent Intubation: No Behavior/Cognition: Alert;Cooperative;Pleasant mood Oral Cavity Assessment: Within Functional Limits Oral Care Completed by SLP: Yes Oral Cavity - Dentition:  (chipped broken teeth) Vision: Functional for self-feeding Self-Feeding Abilities: Able to feed self Patient Positioning: Upright in bed Baseline Vocal Quality: Normal Volitional Cough: Strong;Congested Volitional Swallow: Able to elicit    Oral/Motor/Sensory Function Overall Oral Motor/Sensory Function: Within functional limits   Ice Chips Ice chips: Not tested   Thin Liquid Thin Liquid: Within functional limits Presentation: Straw    Nectar Thick Nectar Thick Liquid: Not tested   Honey Thick Honey Thick Liquid: Not tested   Puree Puree: Within functional limits Presentation: Self Fed   Solid     Solid: Within functional limits Presentation: Self Fed     Lance Green, M.S., CCC-SLP Speech-Language Pathologist [place] [facility] [phone] (ASCOM)  Lance Green Lance Green 12/17/2023,12:48 PM

## 2023-12-17 NOTE — ED Notes (Signed)
 Pt given specimen cup for sputum sample. Given instructions on how to provide sample.

## 2023-12-17 NOTE — ED Notes (Addendum)
 1st sputum collected at 442 am per previous shift nurse [place]

## 2023-12-17 NOTE — Progress Notes (Signed)
 Pharmacy Antibiotic Note  Lance Green is a 48 y.o. male admitted on 12/16/2023 with  aspiration PNA .  Pharmacy has been consulted for Unasyn dosing.  Plan: Unasyn 3 gm IV X 1 given in ED on 3/13 @ 0039. Unasyn 3 gm IV Q6H ordered to start on 3/13 @ 0700.      Temp (24hrs), Avg:98.6 F (37 C), Min:98.6 F (37 C), Max:98.6 F (37 C)  Recent Labs  Lab 12/16/23 2216 12/17/23 0040  WBC 11.4*  --   CREATININE 0.70  --   LATICACIDVEN 1.6 1.4    CrCl cannot be calculated (Unknown ideal weight.).    No Known Allergies  Antimicrobials this admission:   >>    >>   Dose adjustments this admission:   Microbiology results:  BCx:   UCx:    Sputum:    MRSA PCR:   Thank you for allowing pharmacy to be a part of this patient's care.  Cobie Leidner D 12/17/2023 2:49 AM

## 2023-12-17 NOTE — ED Notes (Signed)
 Pt told wife would like him to call her. Pt calling wife with his cell phone.

## 2023-12-17 NOTE — ED Notes (Signed)
 Pt to CT

## 2023-12-17 NOTE — H&P (Signed)
 History and Physical    Patient: Lance Green:[id number] DOB: 07-13-1976 DOA: 12/16/2023 DOS: the patient was seen and examined on 12/17/2023 PCP: [facility]  Patient coming from: Home  Chief Complaint:  Chief Complaint  Patient presents with   Fever    From home states he has been having intermittent SHOB. States he has been feeling sick and shob x 2 weeks. Having productive cough with brown sputum.    Shortness of Breath    HPI: Lance Green is a 48 y.o. male with medical history significant for HTN,  tobacco use, alcohol use disorder with history of an alcohol withdrawal seizure in July 2024, who presented to the ED with 1 month history of cough, shortness of breath and fever and generalized malaise.  Denies chest pain, leg pain or swelling, vomiting or diarrhea. ED course and data review: Tachycardic to 105 and tachypneic to 22 with otherwise normal vitals Labs notable for hemoglobin 6.3, down from 12.6 about 6 months prior, WBC 11.4 with lactic acid 1.6 Sodium 130, bicarb 18 Urinalysis unremarkable COVID flu and RSV negative  Stool for occult blood positive CT chest showing a cavitary lesion right upper lobe-favor necrotizing pneumonia though neoplasm not completely excluded EKG, personally viewed and interpreted showing sinus at 91 with no ischemic changes  Patient treated with Unasyn, IV Protonix and 1 unit PRBC ordered QuantiFERON gold ordered Hospitalist consulted for admission.   Review of Systems  Respiratory: Negative.    Cardiovascular:  Negative for chest pain, palpitations and leg swelling.  Gastrointestinal:  Positive for nausea. Negative for abdominal pain, blood in stool, constipation, diarrhea, melena and vomiting.  Genitourinary: Negative.   All other systems reviewed and are negative.    Past Medical History:  Diagnosis Date   Alcohol abuse    HTN (hypertension)    Neuropathy    Pancreatitis    Peptic ulcer disease     Past Surgical History:  Procedure Laterality Date   ANTERIOR CERVICAL DECOMP/DISCECTOMY FUSION N/A 04/02/2022   Procedure: C3-6 ANTERIOR CERVICAL DECOMPRESSION/DISCECTOMY FUSION 3 LEVELS;  Surgeon: Venetia Night, MD;  Location: [facility] ORS;  Service: Neurosurgery;  Laterality: N/A;   APPENDECTOMY     INCISION AND DRAINAGE Left 11/11/2018   Procedure: INCISION AND DRAINAGE;  Surgeon: Donato Heinz, MD;  Location: [facility] ORS;  Service: Orthopedics;  Laterality: Left;   LAPAROSCOPIC APPENDECTOMY N/A 07/06/2018   Procedure: APPENDECTOMY LAPAROSCOPIC;  Surgeon: Carolan Shiver, MD;  Location: [facility] ORS;  Service: General;  Laterality: N/A;   Social History:  reports that he has been smoking cigarettes. He has never used smokeless tobacco. He reports current alcohol use. He reports that he does not use drugs.  No Known Allergies  Family History  Problem Relation Age of Onset   Cataracts Mother     Prior to Admission medications   Medication Sig Start Date End Date Taking? Authorizing Provider  albuterol (VENTOLIN HFA) 108 (90 Base) MCG/ACT inhaler Inhale 2 puffs into the lungs every 6 (six) hours as needed for wheezing or shortness of breath. 04/25/23   Renae Gloss, Richard, MD  amLODipine (NORVASC) 2.5 MG tablet Take 1 tablet (2.5 mg total) by mouth daily. 04/26/23   Alford Highland, MD  folic acid (FOLVITE) 1 MG tablet Take 1 tablet (1 mg total) by mouth daily. 04/26/23   Alford Highland, MD  gabapentin (NEURONTIN) 100 MG capsule Take 1 capsule (100 mg total) by mouth 3 (three) times daily. 04/25/23 05/25/23  Alford Highland, MD  LORazepam (ATIVAN) 0.5 MG tablet One tab po every eight hours for two days then one tab po twice a day for two days then one tab po daily for three days 04/25/23   Alford Highland, MD  Multiple Vitamin (MULTIVITAMIN WITH MINERALS) TABS tablet Take 1 tablet by mouth daily. 04/26/23   Alford Highland, MD  nicotine (NICODERM CQ - DOSED IN MG/24 HOURS) 14 mg/24hr patch  One 14 mg patch chest wall daily (okay to substitute generic) 04/25/23   Alford Highland, MD  thiamine (VITAMIN B-1) 100 MG tablet Take 1 tablet (100 mg total) by mouth daily. 04/26/23   Alford Highland, MD    Physical Exam: Vitals:   12/17/23 0030 12/17/23 0100 12/17/23 0130 12/17/23 0200  BP: 103/72 103/61 102/70 105/68  Pulse: 92 92 88 86  Resp: (!) 22 18 (!) 22 (!) 26  Temp:      TempSrc:      SpO2: 100% 100% 100% 99%   Physical Exam Vitals and nursing note reviewed.  Constitutional:      General: He is not in acute distress.    Appearance: He is underweight.  HENT:     Head: Normocephalic and atraumatic.  Cardiovascular:     Rate and Rhythm: Normal rate and regular rhythm.     Heart sounds: Normal heart sounds.  Pulmonary:     Effort: Pulmonary effort is normal.     Breath sounds: Normal breath sounds.  Abdominal:     Palpations: Abdomen is soft.     Tenderness: There is no abdominal tenderness.  Neurological:     Mental Status: Mental status is at baseline. He is lethargic.     Labs on Admission: I have personally reviewed following labs and imaging studies  CBC: Recent Labs  Lab 12/16/23 2216  WBC 11.4*  NEUTROABS 6.4  HGB 6.3*  HCT 18.5*  MCV 90.2  PLT 260   Basic Metabolic Panel: Recent Labs  Lab 12/16/23 2216  NA 130*  K 3.8  CL 101  CO2 18*  GLUCOSE 89  BUN 5*  CREATININE 0.70  CALCIUM 8.3*   GFR: CrCl cannot be calculated (Unknown ideal weight.). Liver Function Tests: Recent Labs  Lab 12/16/23 2216  AST 21  ALT 9  ALKPHOS 66  BILITOT 0.4  PROT 6.5  ALBUMIN 2.2*   No results for input(s): "LIPASE", "AMYLASE" in the last 168 hours. No results for input(s): "AMMONIA" in the last 168 hours. Coagulation Profile: Recent Labs  Lab 12/16/23 2216  INR 1.1   Cardiac Enzymes: No results for input(s): "CKTOTAL", "CKMB", "CKMBINDEX", "TROPONINI" in the last 168 hours. BNP (last 3 results) No results for input(s): "PROBNP" in the last  8760 hours. HbA1C: No results for input(s): "HGBA1C" in the last 72 hours. CBG: No results for input(s): "GLUCAP" in the last 168 hours. Lipid Profile: No results for input(s): "CHOL", "HDL", "LDLCALC", "TRIG", "CHOLHDL", "LDLDIRECT" in the last 72 hours. Thyroid Function Tests: No results for input(s): "TSH", "T4TOTAL", "FREET4", "T3FREE", "THYROIDAB" in the last 72 hours. Anemia Panel: No results for input(s): "VITAMINB12", "FOLATE", "FERRITIN", "TIBC", "IRON", "RETICCTPCT" in the last 72 hours. Urine analysis:    Component Value Date/Time   COLORURINE COLORLESS (A) 12/16/2023 2216   APPEARANCEUR CLEAR (A) 12/16/2023 2216   APPEARANCEUR Hazy 01/18/2014 1701   LABSPEC 1.001 (L) 12/16/2023 2216   LABSPEC 1.013 01/18/2014 1701   PHURINE 5.0 12/16/2023 2216   GLUCOSEU NEGATIVE 12/16/2023 2216   GLUCOSEU Negative 01/18/2014 1701   HGBUR NEGATIVE  12/16/2023 2216   BILIRUBINUR NEGATIVE 12/16/2023 2216   BILIRUBINUR Negative 01/18/2014 1701   KETONESUR NEGATIVE 12/16/2023 2216   PROTEINUR NEGATIVE 12/16/2023 2216   NITRITE NEGATIVE 12/16/2023 2216   LEUKOCYTESUR NEGATIVE 12/16/2023 2216   LEUKOCYTESUR Negative 01/18/2014 1701    Radiological Exams on Admission: CT Chest W Contrast Result Date: 12/17/2023 CLINICAL DATA:  Cough, fever, dyspnea EXAM: CT CHEST WITH CONTRAST TECHNIQUE: Multidetector CT imaging of the chest was performed during intravenous contrast administration. RADIATION DOSE REDUCTION: This exam was performed according to the departmental dose-optimization program which includes automated exposure control, adjustment of the mA and/or kV according to patient size and/or use of iterative reconstruction technique. CONTRAST:  75mL OMNIPAQUE IOHEXOL 300 MG/ML  SOLN COMPARISON:  03/31/2021 FINDINGS: Cardiovascular: No significant vascular findings. Normal heart size. No pericardial effusion. Mediastinum/Nodes: Visualized thyroid is unremarkable. There is right hilar adenopathy  with multiple lymph nodes measuring up to 12-13 mm in short axis diameter possibly reactive. The esophagus is unremarkable. Lungs/Pleura: There is extensive cavitation within the right upper lobe with a thick walled cavitary lesion replacing the posterior segment of the right upper lobe, crossing the major fissure, and extending into the superior segment of the right lower lobe. The violation of the fissure favors a infectious process in keeping with necrotizing pneumonia, though neoplasm is not completely excluded. Note that granulomatous infections could appear similarly, as can be seen with mycobacterial infection. Surrounding ground-glass inflammatory infiltrate is seen within the right upper lobe. Air-fluid levels noted within the cavitary lesion in keeping with acute inflammation. There is bronchial wall thickening present in keeping with airway inflammation. No pneumothorax or pleural effusion. Previously noted cavitary lesion within the left upper lobe has nearly resolved a small amount of residual parenchymal scarring. Upper Abdomen: No acute abnormality. Changes of chronic pancreatitis are again identified. Musculoskeletal: No chest wall abnormality. No acute or significant osseous findings. IMPRESSION: 1. Extensive cavitation within the right upper lobe with a thick walled cavitary lesion replacing the posterior segment of the right upper lobe, crossing the major fissure, and extending into the superior segment of the right lower lobe. Favor necrotizing pneumonia, though neoplasm is not completely excluded. Note that granulomatous infections could appear similarly, as can be seen with mycobacterial infection, which should be excluded. Follow-up imaging is recommended after appropriate therapy to document resolution. 2. Right hilar adenopathy, possibly reactive. 3. Near complete resolution of previously noted cavitary lesion within the left upper lobe with a small amount of residual parenchymal scarring.  4. Changes of chronic pancreatitis. Electronically Signed   By: Helyn Numbers M.D.   On: 12/17/2023 01:47   DG Chest Portable 1 View Result Date: 12/16/2023 CLINICAL DATA:  Cough cough, fever, shortness of breath. EXAM: PORTABLE CHEST 1 VIEW COMPARISON:  05/17/2023 FINDINGS: Opacity abutting the fissure. Question of cavitary component. The left lung is clear. Is normal in size. No significant pleural effusion. No pulmonary edema. IMPRESSION: Opacity abutting the fissure, suspicious for pneumonia. Question of cavitary component. Recommend CT for further assessment. Electronically Signed   By: Narda Rutherford M.D.   On: 12/16/2023 23:48     Data Reviewed: Relevant notes from primary care and specialist visits, past discharge summaries as available in EHR, including Care Everywhere. 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 tonight GI consult Continue IV Protonix Continue transfusion of 1 unit PRBC with serial H&H thereafter  Alcohol use disorder History of alcohol withdrawal seizure CIWA withdrawal protocol  Tobacco use disorder Nicotine patch  Essential hypertension Continue amlodipine        DVT prophylaxis: SCD  Consults: GI Dr Norma Fredrickson  Advance Care Planning:   Code Status: Prior   Family Communication: none  Disposition Plan: Back to previous home  environment  Severity of Illness: The appropriate patient status for this patient is INPATIENT. Inpatient status is judged to be reasonable and necessary in order to provide the required intensity of service to ensure the patient's safety. The patient's presenting symptoms, physical exam findings, and initial radiographic and laboratory data in the context of their chronic comorbidities is felt to place them at high risk for further clinical deterioration. Furthermore, it is not anticipated that the patient will be medically stable for discharge from the hospital within 2 midnights of admission.   * I certify that at the point of admission it is my clinical judgment that the patient will require inpatient hospital care spanning beyond 2 midnights from the point of admission due to high intensity of service, high risk for further deterioration and high frequency of surveillance required.*  Author: Andris Baumann, MD 12/17/2023 2:40 AM  For on call review [email or web address].

## 2023-12-17 NOTE — Assessment & Plan Note (Signed)
 Continue amlodipine

## 2023-12-18 ENCOUNTER — Other Ambulatory Visit: Payer: Self-pay

## 2023-12-18 ENCOUNTER — Encounter: Payer: Self-pay | Admitting: Internal Medicine

## 2023-12-18 DIAGNOSIS — J984 Other disorders of lung: Secondary | ICD-10-CM | POA: Diagnosis not present

## 2023-12-18 DIAGNOSIS — D649 Anemia, unspecified: Secondary | ICD-10-CM | POA: Diagnosis not present

## 2023-12-18 DIAGNOSIS — I10 Essential (primary) hypertension: Secondary | ICD-10-CM

## 2023-12-18 DIAGNOSIS — F1090 Alcohol use, unspecified, uncomplicated: Secondary | ICD-10-CM | POA: Diagnosis not present

## 2023-12-18 DIAGNOSIS — J189 Pneumonia, unspecified organism: Secondary | ICD-10-CM | POA: Diagnosis not present

## 2023-12-18 LAB — TYPE AND SCREEN
ABO/RH(D): B POS
Antibody Screen: NEGATIVE
Unit division: 0

## 2023-12-18 LAB — CBC WITH DIFFERENTIAL/PLATELET
Abs Immature Granulocytes: 0.04 10*3/uL (ref 0.00–0.07)
Basophils Absolute: 0 10*3/uL (ref 0.0–0.1)
Basophils Relative: 0 %
Eosinophils Absolute: 0 10*3/uL (ref 0.0–0.5)
Eosinophils Relative: 0 %
HCT: 23.7 % — ABNORMAL LOW (ref 39.0–52.0)
Hemoglobin: 8.2 g/dL — ABNORMAL LOW (ref 13.0–17.0)
Immature Granulocytes: 0 %
Lymphocytes Relative: 27 %
Lymphs Abs: 2.6 10*3/uL (ref 0.7–4.0)
MCH: 29.6 pg (ref 26.0–34.0)
MCHC: 34.6 g/dL (ref 30.0–36.0)
MCV: 85.6 fL (ref 80.0–100.0)
Monocytes Absolute: 0.7 10*3/uL (ref 0.1–1.0)
Monocytes Relative: 8 %
Neutro Abs: 6.3 10*3/uL (ref 1.7–7.7)
Neutrophils Relative %: 65 %
Platelets: 287 10*3/uL (ref 150–400)
RBC: 2.77 MIL/uL — ABNORMAL LOW (ref 4.22–5.81)
RDW: 13.8 % (ref 11.5–15.5)
WBC: 9.8 10*3/uL (ref 4.0–10.5)
nRBC: 0 % (ref 0.0–0.2)

## 2023-12-18 LAB — C-REACTIVE PROTEIN: CRP: 10.8 mg/dL — ABNORMAL HIGH (ref <1.0)

## 2023-12-18 LAB — BPAM RBC
Blood Product Expiration Date: 202504062359
ISSUE DATE / TIME: 202503130343
Unit Type and Rh: 5100

## 2023-12-18 LAB — HEMOGLOBIN
Hemoglobin: 9.5 g/dL — ABNORMAL LOW (ref 13.0–17.0)
Hemoglobin: 8.4 g/dL — ABNORMAL LOW (ref 13.0–17.0)

## 2023-12-18 LAB — HIV ANTIBODY (ROUTINE TESTING W REFLEX): HIV Screen 4th Generation wRfx: NONREACTIVE

## 2023-12-18 LAB — BASIC METABOLIC PANEL
Anion gap: 8 (ref 5–15)
Anion gap: 8 (ref 5–15)
BUN: 8 mg/dL (ref 6–20)
BUN: 8 mg/dL (ref 6–20)
CO2: 23 mmol/L (ref 22–32)
CO2: 22 mmol/L (ref 22–32)
Calcium: 8.4 mg/dL — ABNORMAL LOW (ref 8.9–10.3)
Calcium: 8.1 mg/dL — ABNORMAL LOW (ref 8.9–10.3)
Chloride: 103 mmol/L (ref 98–111)
Chloride: 104 mmol/L (ref 98–111)
Creatinine, Ser: 0.73 mg/dL (ref 0.61–1.24)
Creatinine, Ser: 0.8 mg/dL (ref 0.61–1.24)
GFR, Estimated: >60 mL/min (ref >60)
GFR, Estimated: >60 mL/min (ref >60)
Glucose, Bld: 126 mg/dL — ABNORMAL HIGH (ref 70–99)
Glucose, Bld: 128 mg/dL — ABNORMAL HIGH (ref 70–99)
Potassium: 3.7 mmol/L (ref 3.5–5.1)
Potassium: 3.7 mmol/L (ref 3.5–5.1)
Sodium: 134 mmol/L — ABNORMAL LOW (ref 135–145)
Sodium: 134 mmol/L — ABNORMAL LOW (ref 135–145)

## 2023-12-18 LAB — VITAMIN B12: Vitamin B-12: 379 pg/mL (ref 180–914)

## 2023-12-18 LAB — LEGIONELLA PNEUMOPHILA SEROGP 1 UR AG: L. pneumophila Serogp 1 Ur Ag: NEGATIVE

## 2023-12-18 LAB — MAGNESIUM: Magnesium: 1.2 mg/dL — ABNORMAL LOW (ref 1.7–2.4)

## 2023-12-18 LAB — PHOSPHORUS: Phosphorus: 3.8 mg/dL (ref 2.5–4.6)

## 2023-12-18 MED ORDER — MAGNESIUM SULFATE 2 GM/50ML IV SOLN: 2.0000 g | Freq: Once | INTRAVENOUS | Status: DC

## 2023-12-18 MED ORDER — GUAIFENESIN-DM 100-10 MG/5ML PO SYRP
10.0000 mL | ORAL_SOLUTION | Freq: Four times a day (QID) | ORAL | Status: DC
  Administered 2023-12-18 – 2023-12-21 (×11): 10 mL via ORAL
  Filled 2023-12-18 (×11): qty 10

## 2023-12-18 MED ORDER — MAGNESIUM SULFATE 4 GM/100ML IV SOLN
4.0000 g | Freq: Once | INTRAVENOUS | Status: AC
  Administered 2023-12-18: 4 g via INTRAVENOUS
  Filled 2023-12-18: qty 100

## 2023-12-18 MED ORDER — ENSURE ENLIVE PO LIQD
237.0000 mL | Freq: Two times a day (BID) | ORAL | Status: DC
  Administered 2023-12-18 – 2023-12-20 (×6): 237 mL via ORAL

## 2023-12-18 NOTE — Progress Notes (Signed)
 Inpatient Follow-up/Progress Note   Patient ID: Lance Green is a 48 y.o. male.  Overnight Events / Subjective Findings NAEON. Hgb up to >9 with only 1 uprbc. No signs of GIB. Denies any GI sx Lung lesion/infection still being worked up  Review of Systems  Constitutional:  Negative for activity change, appetite change, chills, diaphoresis, fatigue, fever and unexpected weight change.  HENT:  Negative for trouble swallowing and voice change.   Respiratory:  Positive for shortness of breath. Negative for wheezing.   Cardiovascular:  Negative for chest pain, palpitations and leg swelling.  Gastrointestinal:  Negative for abdominal distention, abdominal pain, anal bleeding, blood in stool, constipation, diarrhea, nausea and vomiting.  Musculoskeletal:  Negative for arthralgias and myalgias.  Skin:  Negative for color change and pallor.  Neurological:  Negative for dizziness, syncope and weakness.  Psychiatric/Behavioral:  Negative for confusion. The patient is not nervous/anxious.   All other systems reviewed and are negative.    Medications  Current Facility-Administered Medications:    acetaminophen (TYLENOL) tablet 650 mg, 650 mg, Oral, Q6H PRN **OR** acetaminophen (TYLENOL) suppository 650 mg, 650 mg, Rectal, Q6H PRN, Andris Baumann, MD   albuterol (PROVENTIL) (2.5 MG/3ML) 0.083% nebulizer solution 2.5 mg, 2.5 mg, Inhalation, Q6H PRN, Andris Baumann, MD   chlordiazePOXIDE (LIBRIUM) capsule 25 mg, 25 mg, Oral, TID, Lurene Shadow, MD, 25 mg at 12/17/23 2258   folic acid (FOLVITE) tablet 1 mg, 1 mg, Oral, Daily, Lindajo Royal V, MD, 1 mg at 12/17/23 0919   LORazepam (ATIVAN) injection 0-4 mg, 0-4 mg, Intravenous, Q6H **FOLLOWED BY** [START ON 12/19/2023] LORazepam (ATIVAN) injection 0-4 mg, 0-4 mg, Intravenous, Q12H, Para March, Odetta Pink, MD   LORazepam (ATIVAN) tablet 1-4 mg, 1-4 mg, Oral, Q1H PRN **OR** LORazepam (ATIVAN) injection 1-4 mg, 1-4 mg, Intravenous, Q1H PRN, Andris Baumann,  MD   multivitamin with minerals tablet 1 tablet, 1 tablet, Oral, Daily, Andris Baumann, MD, 1 tablet at 12/17/23 0919   nicotine (NICODERM CQ - dosed in mg/24 hours) patch 14 mg, 14 mg, Transdermal, Daily, Para March, Odetta Pink, MD   ondansetron (ZOFRAN) tablet 4 mg, 4 mg, Oral, Q6H PRN **OR** ondansetron (ZOFRAN) injection 4 mg, 4 mg, Intravenous, Q6H PRN, Andris Baumann, MD   pantoprazole (PROTONIX) injection 40 mg, 40 mg, Intravenous, Q24H, Lindajo Royal V, MD, 40 mg at 12/17/23 1313   piperacillin-tazobactam (ZOSYN) IVPB 3.375 g, 3.375 g, Intravenous, Q8H, Prince Solian F, RPH, Last Rate: 12.5 mL/hr at 12/18/23 0300, 3.375 g at 12/18/23 0300   thiamine (VITAMIN B1) tablet 100 mg, 100 mg, Oral, Daily, 100 mg at 12/17/23 0919 **OR** thiamine (VITAMIN B1) injection 100 mg, 100 mg, Intravenous, Daily, Lindajo Royal V, MD  piperacillin-tazobactam (ZOSYN)  IV 3.375 g (12/18/23 0300)    acetaminophen **OR** acetaminophen, albuterol, LORazepam **OR** LORazepam, ondansetron **OR** ondansetron (ZOFRAN) IV   Objective    Vitals:   12/17/23 2020 12/17/23 2100 12/17/23 2225 12/18/23 0259  BP: 104/65 (!) 98/58 105/70 101/62  Pulse: 85 83 92 88  Resp:  (!) 25 18   Temp:   98.1 F (36.7 C)   TempSrc:      SpO2:  99% 100% 100%  Weight:   64.6 kg   Height:   5\' 7"  (1.702 m)      Physical Exam Vitals and nursing note reviewed.  Constitutional:      General: He is not in acute distress.    Appearance: Normal appearance. He is not ill-appearing, toxic-appearing  or diaphoretic.  HENT:     Head: Normocephalic and atraumatic.     Nose: Nose normal.     Mouth/Throat:     Mouth: Mucous membranes are moist.     Pharynx: Oropharynx is clear.  Eyes:     General: No scleral icterus.    Extraocular Movements: Extraocular movements intact.  Cardiovascular:     Rate and Rhythm: Normal rate and regular rhythm.  Pulmonary:     Effort: Pulmonary effort is normal.     Breath sounds: No wheezing, rhonchi  or rales.  Abdominal:     General: There is no distension.     Tenderness: There is no guarding.  Musculoskeletal:     Cervical back: Neck supple.     Right lower leg: No edema.     Left lower leg: No edema.  Skin:    General: Skin is warm and dry.     Coloration: Skin is not jaundiced or pale.  Neurological:     General: No focal deficit present.     Mental Status: He is alert and oriented to person, place, and time. Mental status is at baseline.  Psychiatric:        Mood and Affect: Mood normal.        Behavior: Behavior normal.        Thought Content: Thought content normal.        Judgment: Judgment normal.      Laboratory Data Recent Labs  Lab 12/16/23 2216 12/17/23 1628 12/17/23 2002 12/18/23 0209  WBC 11.4*  --  11.2* 9.8  HGB 6.3* 9.4* 9.6* 8.2*  HCT 18.5*  --  27.8* 23.7*  PLT 260  --  298 287  NEUTOPHILPCT 56  --   --  65  LYMPHOPCT 31  --   --  27  MONOPCT 8  --   --  8  EOSPCT 4  --   --  0   Recent Labs  Lab 12/16/23 2216 12/18/23 0209  NA 130* 134*  K 3.8 3.7  CL 101 103  CO2 18* 23  BUN 5* 8  CREATININE 0.70 0.73  CALCIUM 8.3* 8.4*  PROT 6.5  --   BILITOT 0.4  --   ALKPHOS 66  --   ALT 9  --   AST 21  --   GLUCOSE 89 126*   Recent Labs  Lab 12/16/23 2216  INR 1.1      Imaging Studies: CT Chest W Contrast Result Date: 12/17/2023 CLINICAL DATA:  Cough, fever, dyspnea EXAM: CT CHEST WITH CONTRAST TECHNIQUE: Multidetector CT imaging of the chest was performed during intravenous contrast administration. RADIATION DOSE REDUCTION: This exam was performed according to the departmental dose-optimization program which includes automated exposure control, adjustment of the mA and/or kV according to patient size and/or use of iterative reconstruction technique. CONTRAST:  75mL OMNIPAQUE IOHEXOL 300 MG/ML  SOLN COMPARISON:  03/31/2021 FINDINGS: Cardiovascular: No significant vascular findings. Normal heart size. No pericardial effusion.  Mediastinum/Nodes: Visualized thyroid is unremarkable. There is right hilar adenopathy with multiple lymph nodes measuring up to 12-13 mm in short axis diameter possibly reactive. The esophagus is unremarkable. Lungs/Pleura: There is extensive cavitation within the right upper lobe with a thick walled cavitary lesion replacing the posterior segment of the right upper lobe, crossing the major fissure, and extending into the superior segment of the right lower lobe. The violation of the fissure favors a infectious process in keeping with necrotizing pneumonia, though neoplasm is not  completely excluded. Note that granulomatous infections could appear similarly, as can be seen with mycobacterial infection. Surrounding ground-glass inflammatory infiltrate is seen within the right upper lobe. Air-fluid levels noted within the cavitary lesion in keeping with acute inflammation. There is bronchial wall thickening present in keeping with airway inflammation. No pneumothorax or pleural effusion. Previously noted cavitary lesion within the left upper lobe has nearly resolved a small amount of residual parenchymal scarring. Upper Abdomen: No acute abnormality. Changes of chronic pancreatitis are again identified. Musculoskeletal: No chest wall abnormality. No acute or significant osseous findings. IMPRESSION: 1. Extensive cavitation within the right upper lobe with a thick walled cavitary lesion replacing the posterior segment of the right upper lobe, crossing the major fissure, and extending into the superior segment of the right lower lobe. Favor necrotizing pneumonia, though neoplasm is not completely excluded. Note that granulomatous infections could appear similarly, as can be seen with mycobacterial infection, which should be excluded. Follow-up imaging is recommended after appropriate therapy to document resolution. 2. Right hilar adenopathy, possibly reactive. 3. Near complete resolution of previously noted cavitary  lesion within the left upper lobe with a small amount of residual parenchymal scarring. 4. Changes of chronic pancreatitis. Electronically Signed   By: Helyn Numbers M.D.   On: 12/17/2023 01:47   DG Chest Portable 1 View Result Date: 12/16/2023 CLINICAL DATA:  Cough cough, fever, shortness of breath. EXAM: PORTABLE CHEST 1 VIEW COMPARISON:  05/17/2023 FINDINGS: Opacity abutting the fissure. Question of cavitary component. The left lung is clear. Is normal in size. No significant pleural effusion. No pulmonary edema. IMPRESSION: Opacity abutting the fissure, suspicious for pneumonia. Question of cavitary component. Recommend CT for further assessment. Electronically Signed   By: Narda Rutherford M.D.   On: 12/16/2023 23:48    Assessment:   # Normocytic Anemia - no iron deficiency; has not seen any signs of gi bleeding at any point in time -Hemoglobin robustly responded to 1 unit PRBC -No signs of active GI bleeding -No elevation in BUN/creatinine ratio - suspect nutritional component given etoh use and poor po intake  # Diarrhea - has had no diarrhea in the hospital  # Cavitary pna -Etiology being worked up -Tuberculosis and Legionella panels pending  # Chronic ASA use - 162mg  daily  # ETOH use  # SOB with DOE  # Tobacco dependence  # HTN  Plan:   No iron deficiency and no signs of GI bleeding Recommend continuation of lung workup prior to endoscopy/sedation Discussed with patient and his fiance at bedside for bidirectional endoscopy workup as outpatient Advance diet as tolerated Continue twice daily PPI for 8 weeks Avoid aspirin and NSAIDs (ibuprofen, Motrin, Advil, naproxen, Aleve, Naprosyn, Goody's powder, & BC powder).  Hold dvt ppx Monitor H&H.  Transfusion and resuscitation as per primary team Avoid frequent lab draws to prevent lab induced anemia Supportive care and antiemetics as per primary team Maintain two sites IV access Avoid nsaids Monitor for GIB.  I  have provided the patient our business card and office contact to set up office appointment and bidirectional endoscopy  GI to sign off. Available as needed. Please do not hesitate to call regarding questions or concerns.  Management of other medical comorbidities as per primary team  I personally performed the service.  Thank you for allowing [place] to participate in this patient's care.   Jaynie Collins, DO [facility]  Portions of the record may have been created with voice recognition software. Occasional wrong-word  or 'sound-a-like' substitutions may have occurred due to the inherent limitations of voice recognition software.  Read the chart carefully and recognize, using context, where substitutions may have occurred.

## 2023-12-18 NOTE — Progress Notes (Signed)
 Date of Admission:  12/16/2023     ID: Lance Green is a 48 y.o. 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   Tobacco use disorder   Alcohol use disorder    Subjective: Feeling better Still coughing up phlegm  Medications:   chlordiazePOXIDE  25 mg Oral TID   feeding supplement  237 mL Oral BID BM   folic acid  1 mg Oral Daily   LORazepam  0-4 mg Intravenous Q6H   Followed by   Melene Muller ON 12/19/2023] LORazepam  0-4 mg Intravenous Q12H   multivitamin with minerals  1 tablet Oral Daily   nicotine  14 mg Transdermal Daily   pantoprazole (PROTONIX) IV  40 mg Intravenous Q24H   thiamine  100 mg Oral Daily   Or   thiamine  100 mg Intravenous Daily    Objective: Vital signs in last 24 hours: Patient Vitals for the past 24 hrs:  BP Temp Temp src Pulse Resp SpO2 Height Weight  12/18/23 0809 103/73 98.2 F (36.8 C) Oral 80 18 94 % -- --  12/18/23 0259 101/62 -- -- 88 -- 100 % -- --  12/17/23 2225 105/70 98.1 F (36.7 C) -- 92 18 100 % 5\' 7"  (1.702 m) 64.6 kg  12/17/23 2100 (!) 98/58 -- -- 83 (!) 25 99 % -- --  12/17/23 2020 104/65 -- -- 85 -- -- -- --  12/17/23 1915 118/83 -- -- 83 19 100 % -- --  12/17/23 1800 112/78 -- -- 89 20 100 % -- --  12/17/23 1700 123/83 97.9 F (36.6 C) Axillary -- 20 -- -- --  12/17/23 1600 125/64 -- -- 92 19 100 % -- --  12/17/23 1530 134/83 -- -- 84 18 100 % -- --  12/17/23 1400 106/67 -- -- 92 (!) 26 99 % -- --  12/17/23 1319 -- 98.4 F (36.9 C) Oral -- -- -- -- --     PHYSICAL EXAM:  General: Alert, cooperative, no distress, appears stated age.  Lungs: b/l air entry- crepts.rt side Heart: Regular rate and rhythm, no murmur, rub or gallop. Abdomen: Soft, non-tender,not distended. Bowel sounds normal. No masses Extremities: atraumatic, no cyanosis. No edema. No clubbing Skin: No rashes or lesions. Or bruising Lymph: Cervical,  supraclavicular normal. Neurologic: Grossly non-focal  Lab Results    Latest Ref Rng & Units 12/18/2023    8:28 AM 12/18/2023    2:09 AM 12/17/2023    8:02 PM  CBC  WBC 4.0 - 10.5 K/uL  9.8  11.2   Hemoglobin 13.0 - 17.0 g/dL 9.5  8.2  9.6   Hematocrit 39.0 - 52.0 %  23.7  27.8   Platelets 150 - 400 K/uL  287  298        Latest Ref Rng & Units 12/18/2023    2:09 AM 12/16/2023   10:16 PM 06/05/2023   10:16 PM  CMP  Glucose 70 - 99 mg/dL 213  89  84   BUN 6 - 20 mg/dL 8  5  10    Creatinine 0.61 - 1.24 mg/dL 0.86  5.78  4.69   Sodium 135 - 145 mmol/L 134  130  134   Potassium 3.5 - 5.1 mmol/L 3.7  3.8  4.2   Chloride 98 - 111 mmol/L 103  101  97   CO2 22 - 32 mmol/L 23  18  15   Calcium 8.9 - 10.3 mg/dL 8.4  8.3  9.0   Total Protein 6.5 - 8.1 g/dL  6.5  7.9   Total Bilirubin 0.0 - 1.2 mg/dL  0.4  0.8   Alkaline Phos 38 - 126 U/L  66  151   AST 15 - 41 U/L  21  170   ALT 0 - 44 U/L  9  57       Microbiology: 3/12 BC NG Studies/Results: CT Chest W Contrast Result Date: 12/17/2023 CLINICAL DATA:  Cough, fever, dyspnea EXAM: CT CHEST WITH CONTRAST TECHNIQUE: Multidetector CT imaging of the chest was performed during intravenous contrast administration. RADIATION DOSE REDUCTION: This exam was performed according to the departmental dose-optimization program which includes automated exposure control, adjustment of the mA and/or kV according to patient size and/or use of iterative reconstruction technique. CONTRAST:  75mL OMNIPAQUE IOHEXOL 300 MG/ML  SOLN COMPARISON:  03/31/2021 FINDINGS: Cardiovascular: No significant vascular findings. Normal heart size. No pericardial effusion. Mediastinum/Nodes: Visualized thyroid is unremarkable. There is right hilar adenopathy with multiple lymph nodes measuring up to 12-13 mm in short axis diameter possibly reactive. The esophagus is unremarkable. Lungs/Pleura: There is extensive cavitation within the right upper lobe with a thick walled cavitary  lesion replacing the posterior segment of the right upper lobe, crossing the major fissure, and extending into the superior segment of the right lower lobe. The violation of the fissure favors a infectious process in keeping with necrotizing pneumonia, though neoplasm is not completely excluded. Note that granulomatous infections could appear similarly, as can be seen with mycobacterial infection. Surrounding ground-glass inflammatory infiltrate is seen within the right upper lobe. Air-fluid levels noted within the cavitary lesion in keeping with acute inflammation. There is bronchial wall thickening present in keeping with airway inflammation. No pneumothorax or pleural effusion. Previously noted cavitary lesion within the left upper lobe has nearly resolved a small amount of residual parenchymal scarring. Upper Abdomen: No acute abnormality. Changes of chronic pancreatitis are again identified. Musculoskeletal: No chest wall abnormality. No acute or significant osseous findings. IMPRESSION: 1. Extensive cavitation within the right upper lobe with a thick walled cavitary lesion replacing the posterior segment of the right upper lobe, crossing the major fissure, and extending into the superior segment of the right lower lobe. Favor necrotizing pneumonia, though neoplasm is not completely excluded. Note that granulomatous infections could appear similarly, as can be seen with mycobacterial infection, which should be excluded. Follow-up imaging is recommended after appropriate therapy to document resolution. 2. Right hilar adenopathy, possibly reactive. 3. Near complete resolution of previously noted cavitary lesion within the left upper lobe with a small amount of residual parenchymal scarring. 4. Changes of chronic pancreatitis. Electronically Signed   By: Helyn Numbers M.D.   On: 12/17/2023 01:47   DG Chest Portable 1 View Result Date: 12/16/2023 CLINICAL DATA:  Cough cough, fever, shortness of breath. EXAM:  PORTABLE CHEST 1 VIEW COMPARISON:  05/17/2023 FINDINGS: Opacity abutting the fissure. Question of cavitary component. The left lung is clear. Is normal in size. No significant pleural effusion. No pulmonary edema. IMPRESSION: Opacity abutting the fissure, suspicious for pneumonia. Question of cavitary component. Recommend CT for further assessment. Electronically Signed   By: Narda Rutherford M.D.   On: 12/16/2023 23:48     Assessment/Plan: 48 year old male with history of alcohol abuse, previous left lung abscess negative for AFB, ACDF C3-C6 presents with cough of more than a months duration productive of sputum and shortness  of breath of a few days duration worse in the recent 48 hours.  Nocturnal sweats and weakness   Cavitary pneumonia on the right upper lobe extending to the lower lobe There is a thick-walled cavity very likely related to alcohol use aspiration and necrotizing pneumonia leading out to a cavitary lesion.  The differential diagnosis would be tuberculosis especially with his alcohol use may need to rule out TB. Atypical mycobacteria  possible On airborne isolation 3 sputums for AFB sent Patient is on  Zosyn so as to cover Klebsiella which he is at high risk for.   other organisms are likely Streptococcus and anaerobes   Non infectious cause would be vasculitis  Anemia   Hyponatremia secondary to the lung lesion likely   History of ACDF C3-C6 ? Discussed the management with the patient

## 2023-12-18 NOTE — Plan of Care (Signed)

## 2023-12-18 NOTE — Progress Notes (Signed)
 PULMONOLOGY         Date: 12/18/2023,   MRN# [id number] Lance Green 28-Mar-1976     AdmissionWeight: 64.6 kg                 CurrentWeight: 64.6 kg  Referring provider: Dr Myriam Forehand   CHIEF COMPLAINT:   Dyspnea and cavitary lung disease    HISTORY OF PRESENT ILLNESS    Lance Green is a 48 y.o. male with medical history significant for essential HTN,  tobacco use, alcohol use disorder with history of an alcohol withdrawal seizure in July 2024, who presented to the ED with 1 month history of cough, shortness of breath and fever and generalized malaise.  Denies chest pain, leg pain or swelling, vomiting or diarrhea. He was noted to be tachycardic and tachypneic on arrival to ER. Labs notable for hemoglobin 6.3, down from 12.6 about 6 months prior, WBC 11.4 with lactic acid 1.6 Sodium 130, bicarb 18 Urinalysis unremarkable COVID flu and RSV negative.  tool for occult blood positive CT chest showing a cavitary lesion right upper lobe-favor necrotizing pneumonia though neoplasm not completely excluded EKG, with mild ischemic changes per my read.  Patient treated with Unasyn, IV Protonix and 1 unit PRBC ordered QuantiFERON gold ordered.  He was noted to have cavitary lung lesion and PCCM is consulted for further evaluation.    12/18/23- patient seen at bedside, no signs of withdrawal from alcohol.  Will place patient on scheduled librium for ppx due to numerous admissions with etoh withdrawal and seizures. His blood cultures are negative x 2 days.  Leukocytosis has resolved. He feels better on current antibiotics and currently saturating 100% on room air.   His CRP is markedly elevated likely due to active infectious process.  I dont think he needs steroids. He has been evaluated by infectious disease specialist who has refined his antimicrobial therapy with change to zosyn and agrees that this is likely related to infection and may be due to klebsiella, anaerobes, strep and also  possible TB which is being investigated.  Patient remains negative pressure isolation.   PAST MEDICAL HISTORY   Past Medical History:  Diagnosis Date   Alcohol abuse    HTN (hypertension)    Neuropathy    Pancreatitis    Peptic ulcer disease      SURGICAL HISTORY   Past Surgical History:  Procedure Laterality Date   ANTERIOR CERVICAL DECOMP/DISCECTOMY FUSION N/A 04/02/2022   Procedure: C3-6 ANTERIOR CERVICAL DECOMPRESSION/DISCECTOMY FUSION 3 LEVELS;  Surgeon: Venetia Night, MD;  Location: [facility] OR[facility];  Service: Neurosurgery;  Laterality: N/A;   APPENDECTOMY     INCISION AND DRAINAGE Left 11/11/2018   Procedure: INCISION AND DRAINAGE;  Surgeon: Donato Heinz, MD;  Location: [facility] OR[facility];  Service: Orthopedics;  Laterality: Left;   LAPAROSCOPIC APPENDECTOMY N/A 07/06/2018   Procedure: APPENDECTOMY LAPAROSCOPIC;  Surgeon: Carolan Shiver, MD;  Location: [facility] OR[facility];  Service: General;  Laterality: N/A;     FAMILY HISTORY   Family History  Problem Relation Age of Onset   Cataracts Mother      SOCIAL HISTORY   Social History   Tobacco Use   Smoking status: Every Day    Current packs/day: 0.50    Types: Cigarettes   Smokeless tobacco: Never  Vaping Use   Vaping status: Never Used  Substance Use Topics   Alcohol use: Yes    Comment: daily 40 oz, reports 4 40oz/day   Drug use: No  MEDICATIONS    Home Medication:     Current Medication:  Current Facility-Administered Medications:    acetaminophen (TYLENOL) tablet 650 mg, 650 mg, Oral, Q6H PRN **OR** acetaminophen (TYLENOL) suppository 650 mg, 650 mg, Rectal, Q6H PRN, Andris Baumann, MD   albuterol (PROVENTIL) (2.5 MG/3ML) 0.083% nebulizer solution 2.5 mg, 2.5 mg, Inhalation, Q6H PRN, Andris Baumann, MD   chlordiazePOXIDE (LIBRIUM) capsule 25 mg, 25 mg, Oral, TID, Lurene Shadow, MD, 25 mg at 12/17/23 2258   folic acid (FOLVITE) tablet 1 mg, 1 mg, Oral, Daily, Lindajo Royal V, MD, 1 mg at 12/17/23 0919    LORazepam (ATIVAN) injection 0-4 mg, 0-4 mg, Intravenous, Q6H **FOLLOWED BY** [START ON 12/19/2023] LORazepam (ATIVAN) injection 0-4 mg, 0-4 mg, Intravenous, Q12H, Para March, Odetta Pink, MD   LORazepam (ATIVAN) tablet 1-4 mg, 1-4 mg, Oral, Q1H PRN **OR** LORazepam (ATIVAN) injection 1-4 mg, 1-4 mg, Intravenous, Q1H PRN, Andris Baumann, MD   multivitamin with minerals tablet 1 tablet, 1 tablet, Oral, Daily, Andris Baumann, MD, 1 tablet at 12/17/23 0919   nicotine (NICODERM CQ - dosed in mg/24 hours) patch 14 mg, 14 mg, Transdermal, Daily, Lindajo Royal V, MD   ondansetron (ZOFRAN) tablet 4 mg, 4 mg, Oral, Q6H PRN **OR** ondansetron (ZOFRAN) injection 4 mg, 4 mg, Intravenous, Q6H PRN, Andris Baumann, MD   pantoprazole (PROTONIX) injection 40 mg, 40 mg, Intravenous, Q24H, Lindajo Royal V, MD, 40 mg at 12/17/23 1313   piperacillin-tazobactam (ZOSYN) IVPB 3.375 g, 3.375 g, Intravenous, Q8H, Prince Solian F, RPH, Last Rate: 12.5 mL/hr at 12/18/23 0300, 3.375 g at 12/18/23 0300   thiamine (VITAMIN B1) tablet 100 mg, 100 mg, Oral, Daily, 100 mg at 12/17/23 0919 **OR** thiamine (VITAMIN B1) injection 100 mg, 100 mg, Intravenous, Daily, Andris Baumann, MD    ALLERGIES   Patient has no known allergies.     REVIEW OF SYSTEMS    Review of Systems:  Gen:  Denies  fever, sweats, chills weigh loss  HEENT: Denies blurred vision, double vision, ear pain, eye pain, hearing loss, nose bleeds, sore throat Cardiac:  No dizziness, chest pain or heaviness, chest tightness,edema Resp:   reports dyspnea chronically  Gi: Denies swallowing difficulty, stomach pain, nausea or vomiting, diarrhea, constipation, bowel incontinence Gu:  Denies bladder incontinence, burning urine Ext:   Denies Joint pain, stiffness or swelling Skin: Denies  skin rash, easy bruising or bleeding or hives Endoc:  Denies polyuria, polydipsia , polyphagia or weight change Psych:   Denies depression, insomnia or hallucinations   Other:   All other systems negative   VS: BP 101/62   Pulse 88   Temp 98.1 F (36.7 C)   Resp 18   Ht 5\' 7"  (1.702 m)   Wt 64.6 kg   SpO2 100%   BMI 22.31 kg/m      PHYSICAL EXAM    GENERAL:NAD, no fevers, chills, no weakness no fatigue HEAD: Normocephalic, atraumatic.  EYES: Pupils equal, round, reactive to light. Extraocular muscles intact. No scleral icterus.  MOUTH: Moist mucosal membrane. Dentition intact. No abscess noted.  EAR, NOSE, THROAT: Clear without exudates. No external lesions.  NECK: Supple. No thyromegaly. No nodules. No JVD.  PULMONARY: decreased breath sounds with mild rhonchi worse at bases bilaterally.  CARDIOVASCULAR: S1 and S2. Regular rate and rhythm. No murmurs, rubs, or gallops. No edema. Pedal pulses 2+ bilaterally.  GASTROINTESTINAL: Soft, nontender, nondistended. No masses. Positive bowel sounds. No hepatosplenomegaly.  MUSCULOSKELETAL: No swelling, clubbing, or edema.  Range of motion full in all extremities.  NEUROLOGIC: Cranial nerves II through XII are intact. No gross focal neurological deficits. Sensation intact. Reflexes intact.  SKIN: No ulceration, lesions, rashes, or cyanosis. Skin warm and dry. Turgor intact.  PSYCHIATRIC: Mood, affect within normal limits. The patient is awake, alert and oriented x 3. Insight, judgment intact.       IMAGING      Contains abnormal data C-reactive protein Order: [id number]  Status: Final result     Next appt: None   Test Result Released: No (inaccessible in MyChart)   0 Result Notes    Component Ref Range & Units (hover) 1 d ago  CRP 10.8      Result Notes    Component Ref Range & Units (hover) 1 d ago  Specimen Description SPU EXPECTORATED SPUTUM Performed at [facility], [address] [place], [place] [address]  Special Requests NONE Reflexed from [phone] Performed at [facility], [address] [place], [place] [address]  Gram Stain MODERATE WBC PRESENT,BOTH PMN AND  MONONUCLEAR FEW GRAM POSITIVE COCCI IN PAIRS RARE GRAM NEGATIVE RODS Performed at [facility], [address] [place], [place] [address]  Culture PENDING      ASSESSMENT/PLAN   Recurrent pneumonia with cavitary lung process    - AFB pCR x 3    - patient known to have seizure disorder and likely has had multiple episodes of aspiration induced pneumonia.  He may benefit from bronchoscopy.     - Gram stain and cultures - sputum -pendning    - empiric antibiotics- infectious disease is on case - appreciate input-zosyn    - procalcitionin    - MRSA pcr     - CRP trend is elevated     - ANA comprehensive to rule out AI related cavitary lung disease            Thank you for allowing me to participate in the care of this patient.   Patient/Family are satisfied with care plan and all questions have been answered.    Provider disclosure: Patient with at least one acute or chronic illness or injury that poses a threat to life or bodily function and is being managed actively during this encounter.  All of the below services have been performed independently by signing provider:  review of prior documentation from internal and or external health records.  Review of previous and current lab results.  Interview and comprehensive assessment during patient visit today. Review of current and previous chest radiographs/CT scans. Discussion of management and test interpretation with health care team and patient/family.   This document was prepared using Dragon voice recognition software and may include unintentional dictation errors.     Vida Rigger, M.D.  Division of Pulmonary & Critical Care Medicine

## 2023-12-18 NOTE — Discharge Instructions (Signed)

## 2023-12-18 NOTE — Progress Notes (Addendum)
 Progress Note    Lance Green  KVQ:[id number] DOB: Mar 03, 1976  DOA: 12/16/2023 PCP: [facility]      Brief Narrative:    Medical records reviewed and are as summarized below:  Lance Green is a 48 y.o. male with medical history significant for hypertension, history of pancreatitis, history of peptic ulcer disease, tobacco use disorder, alcohol use disorder, history of alcohol withdrawal seizure in July 2024, who presented to the hospital with 1 month history of cough, shortness of breath, general weakness, night sweats and weight loss of about 10 pounds.  Diagnostic data significant for hemoglobin of 6.3 and CT chest concerning for right upper lobe cavitary necrotizing pneumonia.      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   Right upper lobe cavitary, necrotizing pneumonia: IV Unasyn has been changed to IV Zosyn.  Strep pneumo urine antigen and MRSA screen were negative. ANA, Fungitell, Legionella urine antigen are pending Sputum for AFB and MTB RIF, QuantiFERON-TB gold are pending. CRP elevated, 10.8 Continue airborne precautions Appreciate input from ID specialist and pulmonologist.    Severe anemia, positive heme stools: Hemoglobin improved to 8.4.   S/p transfusion with 1 unit of PRBCs on 12/16/2023.  Repeat CBC.   Hemoglobin was 6.3 on admission. Iron 39, saturation ratio 24, TIBC 165, ferritin 474, folate 12.3. Iron deficiency has been ruled out. No overt GI bleeding. Continue Protonix twice daily for 8 weeks.  Avoid NSAIDs. No plan for endoscopic workup in the inpatient setting.  This can be pursued in the outpatient setting. Outpatient follow-up with gastroenterology as recommended.   Hypomagnesemia: Magnesium was 1.3.   Replete with IV magnesium sulfate 4 g.  Monitor electrolytes.   Alcohol use disorder: Continue Librium.  He is on Ativan as needed per CIWA protocol.    Tobacco use disorder: Nicotine patch.   History of hypertension: BP still borderline low.  Hold amlodipine for now.   Comorbidities include history of peptic ulcer disease, history of pancreatitis, history of alcohol withdrawal seizure   Diet Order             DIET SOFT Room service appropriate? Yes; Fluid consistency: Thin  Diet effective now                            Consultants: ID specialist Gastroenterologist  Procedures: None    Medications:    chlordiazePOXIDE  25 mg Oral TID   feeding supplement  237 mL Oral BID BM   folic acid  1 mg Oral Daily   LORazepam  0-4 mg Intravenous Q6H   Followed by   Melene Muller ON 12/19/2023] LORazepam  0-4 mg Intravenous Q12H   multivitamin with minerals  1 tablet Oral Daily   nicotine  14 mg Transdermal Daily   pantoprazole (PROTONIX) IV  40 mg Intravenous Q24H   thiamine  100 mg Oral Daily   Or   thiamine  100 mg Intravenous Daily   Continuous Infusions:  magnesium sulfate bolus IVPB     piperacillin-tazobactam (ZOSYN)  IV 3.375 g (12/18/23 1010)     Anti-infectives (From admission, onward)    Start     Dose/Rate Route Frequency Ordered Stop  12/17/23 1800  piperacillin-tazobactam (ZOSYN) IVPB 3.375 g        3.375 g 12.5 mL/hr over 240 Minutes Intravenous Every 8 hours 12/17/23 1738     12/17/23 0700  Ampicillin-Sulbactam (UNASYN) 3 g in sodium chloride 0.9 % 100 mL IVPB  Status:  Discontinued        3 g 200 mL/hr over 30 Minutes Intravenous Every 6 hours 12/17/23 0248 12/17/23 1723   12/17/23 0030  Ampicillin-Sulbactam (UNASYN) 3 g in sodium chloride 0.9 % 100 mL IVPB        3 g 200 mL/hr over 30 Minutes Intravenous  Once 12/17/23 0021 12/17/23 0138              Family Communication/Anticipated D/C date and plan/Code Status   DVT  prophylaxis: SCDs Start: 12/17/23 0247     Code Status: Full Code  Family Communication: Plan discussed with Inetta Fermo, fiance, at the bedside. Disposition Plan: Plan to discharge home   Status is: Inpatient Remains inpatient appropriate because: Cavitary pneumonia       Subjective:   Interval events noted.  He complains of cough but "can't get everything up".  No shortness of breath or chest pain.  No night sweats.  Inetta Fermo, fiance, was at the bedside.  Objective:    Vitals:   12/17/23 2100 12/17/23 2225 12/18/23 0259 12/18/23 0809  BP: (!) 98/58 105/70 101/62 103/73  Pulse: 83 92 88 80  Resp: (!) 25 18  18   Temp:  98.1 F (36.7 C)  98.2 F (36.8 C)  TempSrc:    Oral  SpO2: 99% 100% 100% 94%  Weight:  64.6 kg    Height:  5\' 7"  (1.702 m)     No data found.   Intake/Output Summary (Last 24 hours) at 12/18/2023 1105 Last data filed at 12/17/2023 2216 Gross per 24 hour  Intake 50 ml  Output --  Net 50 ml   Filed Weights   12/17/23 2225  Weight: 64.6 kg    Exam:  GEN: NAD SKIN: Warm and dry EYES: No pallor or icterus ENT: MMM CV: RRR PULM: Mild bilateral rhonchi ABD: soft, ND, NT, +BS CNS: AAO x 3, non focal EXT: No edema or tenderness         Data Reviewed:   I have personally reviewed following labs and imaging studies:  Labs: Labs show the following:   Basic Metabolic Panel: Recent Labs  Lab 12/16/23 2216 12/18/23 0209  NA 130* 134*  K 3.8 3.7  CL 101 103  CO2 18* 23  GLUCOSE 89 126*  BUN 5* 8  CREATININE 0.70 0.73  CALCIUM 8.3* 8.4*  MG  --  1.2*  PHOS  --  3.8   GFR Estimated Creatinine Clearance: 102.1 mL/min (by C-G formula based on SCr of 0.73 mg/dL). Liver Function Tests: Recent Labs  Lab 12/16/23 2216  AST 21  ALT 9  ALKPHOS 66  BILITOT 0.4  PROT 6.5  ALBUMIN 2.2*   No results for input(s): "LIPASE", "AMYLASE" in the last 168 hours. No results for input(s): "AMMONIA" in the last 168 hours. Coagulation  profile Recent Labs  Lab 12/16/23 2216  INR 1.1    CBC: Recent Labs  Lab 12/16/23 2216 12/17/23 1628 12/17/23 2002 12/18/23 0209 12/18/23 0828  WBC 11.4*  --  11.2* 9.8  --   NEUTROABS 6.4  --   --  6.3  --   HGB 6.3* 9.4* 9.6* 8.2* 9.5*  HCT 18.5*  --  27.8*  23.7*  --   MCV 90.2  --  87.1 85.6  --   PLT 260  --  298 287  --    Cardiac Enzymes: No results for input(s): "CKTOTAL", "CKMB", "CKMBINDEX", "TROPONINI" in the last 168 hours. BNP (last 3 results) No results for input(s): "PROBNP" in the last 8760 hours. CBG: No results for input(s): "GLUCAP" in the last 168 hours. D-Dimer: No results for input(s): "DDIMER" in the last 72 hours. Hgb A1c: No results for input(s): "HGBA1C" in the last 72 hours. Lipid Profile: No results for input(s): "CHOL", "HDL", "LDLCALC", "TRIG", "CHOLHDL", "LDLDIRECT" in the last 72 hours. Thyroid function studies: No results for input(s): "TSH", "T4TOTAL", "T3FREE", "THYROIDAB" in the last 72 hours.  Invalid input(s): "FREET3" Anemia work up: Recent Labs    12/17/23 1453  FOLATE 12.3  FERRITIN 474*  TIBC 165*  IRON 39*   Sepsis Labs: Recent Labs  Lab 12/16/23 2216 12/17/23 0040 12/17/23 2002 12/18/23 0209  WBC 11.4*  --  11.2* 9.8  LATICACIDVEN 1.6 1.4  --   --     Microbiology Recent Results (from the past 240 hours)  Culture, blood (Routine x 2)     Status: None (Preliminary result)   Collection Time: 12/16/23 10:17 PM   Specimen: BLOOD  Result Value Ref Range Status   Specimen Description BLOOD RIGHT WRIST  Final   Special Requests   Final    BOTTLES DRAWN AEROBIC AND ANAEROBIC Blood Culture results may not be optimal due to an inadequate volume of blood received in culture bottles   Culture   Final    NO GROWTH 2 DAYS Performed at [facility], [address] [place], [place] [address]    Report Status PENDING  Incomplete  Culture, blood (Routine x 2)     Status: None (Preliminary result)   Collection  Time: 12/16/23 10:17 PM   Specimen: BLOOD  Result Value Ref Range Status   Specimen Description BLOOD RIGHT HAND  Final   Special Requests   Final    BOTTLES DRAWN AEROBIC AND ANAEROBIC Blood Culture results may not be optimal due to an inadequate volume of blood received in culture bottles   Culture   Final    NO GROWTH 2 DAYS Performed at [facility], [address], [place], [place] [address]    Report Status PENDING  Incomplete  Resp panel by RT-PCR (RSV, Flu A&B, Covid) Anterior Nasal Swab     Status: None   Collection Time: 12/16/23 10:19 PM   Specimen: Anterior Nasal Swab  Result Value Ref Range Status   SARS Coronavirus 2 by RT PCR NEGATIVE NEGATIVE Final    Comment: (NOTE) SARS-CoV-2 target nucleic acids are NOT DETECTED.  The SARS-CoV-2 RNA is generally detectable in upper respiratory specimens during the acute phase of infection. The lowest concentration of SARS-CoV-2 viral copies this assay can detect is 138 copies/mL. A negative result does not preclude SARS-Cov-2 infection and should not be used as the sole basis for treatment or other patient management decisions. A negative result may occur with  improper specimen collection/handling, submission of specimen other than nasopharyngeal swab, presence of viral mutation(s) within the areas targeted by this assay, and inadequate number of viral copies(<138 copies/mL). A negative result must be combined with clinical observations, patient history, and epidemiological information. The expected result is Negative.  Fact Sheet for Patients:  BloggerCourse.com  Fact Sheet for Healthcare Providers:  SeriousBroker.it  This test is no t yet approved  or cleared by the [place] and  has been authorized for detection and/or diagnosis of SARS-CoV-2 by FDA under an Emergency Use Authorization (EUA). This EUA will remain  in effect (meaning this test can be  used) for the duration of the COVID-19 declaration under Section 564(b)(1) of the Act, 21 U.S.C.section 360bbb-3(b)(1), unless the authorization is terminated  or revoked sooner.       Influenza A by PCR NEGATIVE NEGATIVE Final   Influenza B by PCR NEGATIVE NEGATIVE Final    Comment: (NOTE) The Xpert Xpress SARS-CoV-2/FLU/RSV plus assay is intended as an aid in the diagnosis of influenza from Nasopharyngeal swab specimens and should not be used as a sole basis for treatment. Nasal washings and aspirates are unacceptable for Xpert Xpress SARS-CoV-2/FLU/RSV testing.  Fact Sheet for Patients: BloggerCourse.com  Fact Sheet for Healthcare Providers: SeriousBroker.it  This test is not yet approved or cleared by the [place] FDA and has been authorized for detection and/or diagnosis of SARS-CoV-2 by FDA under an Emergency Use Authorization (EUA). This EUA will remain in effect (meaning this test can be used) for the duration of the COVID-19 declaration under Section 564(b)(1) of the Act, 21 U.S.C. section 360bbb-3(b)(1), unless the authorization is terminated or revoked.     Resp Syncytial Virus by PCR NEGATIVE NEGATIVE Final    Comment: (NOTE) Fact Sheet for Patients: BloggerCourse.com  Fact Sheet for Healthcare Providers: SeriousBroker.it  This test is not yet approved or cleared by the [place] FDA and has been authorized for detection and/or diagnosis of SARS-CoV-2 by FDA under an Emergency Use Authorization (EUA). This EUA will remain in effect (meaning this test can be used) for the duration of the COVID-19 declaration under Section 564(b)(1) of the Act, 21 U.S.C. section 360bbb-3(b)(1), unless the authorization is terminated or revoked.  Performed at [facility], [address] [place], [place] [address]   MRSA Next Gen by PCR, Nasal     Status:  None   Collection Time: 12/17/23  8:30 PM   Specimen: Sputum; Nasal Swab  Result Value Ref Range Status   MRSA by PCR Next Gen NOT DETECTED NOT DETECTED Final    Comment: (NOTE) The GeneXpert MRSA Assay (FDA approved for NASAL specimens only), is one component of a comprehensive MRSA colonization surveillance program. It is not intended to diagnose MRSA infection nor to guide or monitor treatment for MRSA infections. Test performance is not FDA approved in patients less than 29 years old. Performed at [facility], [address] [place], [place] [address]   Expectorated Sputum Assessment w Gram Stain, Rflx to Resp Cult     Status: None   Collection Time: 12/17/23  8:30 PM   Specimen: Sputum  Result Value Ref Range Status   Specimen Description SPU EXPECTORATED SPUTUM  Final   Special Requests NONE  Final   Sputum evaluation   Final    THIS SPECIMEN IS ACCEPTABLE FOR SPUTUM CULTURE Performed at [facility], [address], [place], [place] [address]    Report Status 12/17/2023 FINAL  Final  Culture, Respiratory w Gram Stain     Status: None (Preliminary result)   Collection Time: 12/17/23  8:30 PM   Specimen: Sputum  Result Value Ref Range Status   Specimen Description   Final    SPU EXPECTORATED SPUTUM Performed at [facility], [address], [place], [place] [address]    Special Requests   Final    NONE Reflexed from  [id number] Performed at [facility], [address] [address] [place], [place] [address]    Gram Stain   Final    MODERATE WBC PRESENT,BOTH PMN AND MONONUCLEAR FEW GRAM POSITIVE COCCI IN PAIRS RARE GRAM NEGATIVE RODS Performed at [facility], [address] [place], [place] [address]    Culture PENDING  Incomplete   Report Status PENDING  Incomplete    Procedures and diagnostic studies:  CT Chest W Contrast Result Date: 12/17/2023 CLINICAL DATA:  Cough, fever, dyspnea EXAM: CT CHEST WITH CONTRAST TECHNIQUE:  Multidetector CT imaging of the chest was performed during intravenous contrast administration. RADIATION DOSE REDUCTION: This exam was performed according to the departmental dose-optimization program which includes automated exposure control, adjustment of the mA and/or kV according to patient size and/or use of iterative reconstruction technique. CONTRAST:  75mL OMNIPAQUE IOHEXOL 300 MG/ML  SOLN COMPARISON:  03/31/2021 FINDINGS: Cardiovascular: No significant vascular findings. Normal heart size. No pericardial effusion. Mediastinum/Nodes: Visualized thyroid is unremarkable. There is right hilar adenopathy with multiple lymph nodes measuring up to 12-13 mm in short axis diameter possibly reactive. The esophagus is unremarkable. Lungs/Pleura: There is extensive cavitation within the right upper lobe with a thick walled cavitary lesion replacing the posterior segment of the right upper lobe, crossing the major fissure, and extending into the superior segment of the right lower lobe. The violation of the fissure favors a infectious process in keeping with necrotizing pneumonia, though neoplasm is not completely excluded. Note that granulomatous infections could appear similarly, as can be seen with mycobacterial infection. Surrounding ground-glass inflammatory infiltrate is seen within the right upper lobe. Air-fluid levels noted within the cavitary lesion in keeping with acute inflammation. There is bronchial wall thickening present in keeping with airway inflammation. No pneumothorax or pleural effusion. Previously noted cavitary lesion within the left upper lobe has nearly resolved a small amount of residual parenchymal scarring. Upper Abdomen: No acute abnormality. Changes of chronic pancreatitis are again identified. Musculoskeletal: No chest wall abnormality. No acute or significant osseous findings. IMPRESSION: 1. Extensive cavitation within the right upper lobe with a thick walled cavitary lesion replacing  the posterior segment of the right upper lobe, crossing the major fissure, and extending into the superior segment of the right lower lobe. Favor necrotizing pneumonia, though neoplasm is not completely excluded. Note that granulomatous infections could appear similarly, as can be seen with mycobacterial infection, which should be excluded. Follow-up imaging is recommended after appropriate therapy to document resolution. 2. Right hilar adenopathy, possibly reactive. 3. Near complete resolution of previously noted cavitary lesion within the left upper lobe with a small amount of residual parenchymal scarring. 4. Changes of chronic pancreatitis. Electronically Signed   By: Helyn Numbers M.D.   On: 12/17/2023 01:47   DG Chest Portable 1 View Result Date: 12/16/2023 CLINICAL DATA:  Cough cough, fever, shortness of breath. EXAM: PORTABLE CHEST 1 VIEW COMPARISON:  05/17/2023 FINDINGS: Opacity abutting the fissure. Question of cavitary component. The left lung is clear. Is normal in size. No significant pleural effusion. No pulmonary edema. IMPRESSION: Opacity abutting the fissure, suspicious for pneumonia. Question of cavitary component. Recommend CT for further assessment. Electronically Signed   By: Narda Rutherford M.D.   On: 12/16/2023 23:48               LOS: 1 day   Dorota Heinrichs  [facility]   Pager on [email or web address]. If 7PM-7AM, please contact night-coverage at [email or web address]     12/18/2023, 11:05 AM         \

## 2023-12-18 NOTE — TOC Initial Note (Signed)
 TOC consult for Substance Use Disorder, This toc added Substance Use Resources to the AVS to print at DC. Has [facility]     General - General Practice    [phone]  PCP Pharmacy is [facility] in [place]

## 2023-12-18 NOTE — Progress Notes (Signed)
 Pt c/o persistent non productive cough. States he feels the sputum stuck in his chest and throat. Ordered him some Robitussin DM per standing order. Will continue to monitor.

## 2023-12-19 DIAGNOSIS — J984 Other disorders of lung: Secondary | ICD-10-CM | POA: Diagnosis not present

## 2023-12-19 DIAGNOSIS — J189 Pneumonia, unspecified organism: Secondary | ICD-10-CM | POA: Diagnosis not present

## 2023-12-19 LAB — ANA COMPREHENSIVE PANEL

## 2023-12-19 LAB — MAGNESIUM: Magnesium: 1.7 mg/dL (ref 1.7–2.4)

## 2023-12-19 LAB — CBC
HCT: 21.6 % — ABNORMAL LOW (ref 39.0–52.0)
Hemoglobin: 7.3 g/dL — ABNORMAL LOW (ref 13.0–17.0)
MCH: 30 pg (ref 26.0–34.0)
MCHC: 33.8 g/dL (ref 30.0–36.0)
MCV: 88.9 fL (ref 80.0–100.0)
Platelets: 275 10*3/uL (ref 150–400)
RBC: 2.43 MIL/uL — ABNORMAL LOW (ref 4.22–5.81)
RDW: 14.1 % (ref 11.5–15.5)
WBC: 9.2 10*3/uL (ref 4.0–10.5)
nRBC: 0 % (ref 0.0–0.2)

## 2023-12-19 LAB — RETICULOCYTES
Immature Retic Fract: 27.6 % — ABNORMAL HIGH (ref 2.3–15.9)
RBC.: 2.43 MIL/uL — ABNORMAL LOW (ref 4.22–5.81)
Retic Count, Absolute: 85.5 10*3/uL (ref 19.0–186.0)
Retic Ct Pct: 3.5 % — ABNORMAL HIGH (ref 0.4–3.1)

## 2023-12-19 LAB — PHOSPHORUS: Phosphorus: 3.7 mg/dL (ref 2.5–4.6)

## 2023-12-19 MED ORDER — CHLORDIAZEPOXIDE HCL 5 MG PO CAPS
10.0000 mg | ORAL_CAPSULE | Freq: Three times a day (TID) | ORAL | Status: DC
  Administered 2023-12-19 – 2023-12-21 (×7): 10 mg via ORAL
  Filled 2023-12-19 (×7): qty 2

## 2023-12-19 MED ORDER — PANTOPRAZOLE SODIUM 40 MG PO TBEC
40.0000 mg | DELAYED_RELEASE_TABLET | Freq: Two times a day (BID) | ORAL | Status: DC
  Administered 2023-12-19 – 2023-12-21 (×4): 40 mg via ORAL
  Filled 2023-12-19 (×4): qty 1

## 2023-12-19 NOTE — Plan of Care (Signed)
  Problem: Clinical Measurements: Goal: Diagnostic test results will improve Outcome: Progressing   Problem: Activity: Goal: Risk for activity intolerance will decrease Outcome: Progressing   Problem: Nutrition: Goal: Adequate nutrition will be maintained Outcome: Progressing   Problem: Coping: Goal: Level of anxiety will decrease Outcome: Progressing   Problem: Safety: Goal: Ability to remain free from injury will improve Outcome: Progressing

## 2023-12-19 NOTE — Progress Notes (Addendum)
 Progress Note    Lance Green  ZOX:[id number] DOB: 1975/11/14  DOA: 12/16/2023 PCP: [facility]      Brief Narrative:    Medical records reviewed and are as summarized below:  Lance Green is a 48 y.o. male with medical history significant for hypertension, history of pancreatitis, history of peptic ulcer disease, tobacco use disorder, alcohol use disorder, history of alcohol withdrawal seizure in July 2024, who presented to the hospital with 1 month history of cough, shortness of breath, general weakness, night sweats and weight loss of about 10 pounds.  Diagnostic data significant for hemoglobin of 6.3 and CT chest concerning for right upper lobe cavitary necrotizing pneumonia.      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   Right upper lobe cavitary, necrotizing pneumonia: Continue IV Zosyn.   Strep pneumo urine antigen, Legionella urine antigen and MRSA screen were negative.  ANA comprehensive panel was negative. Fungitell, are pending Sputum for AFB and MTB RIF, QuantiFERON-TB gold are pending. CRP elevated, 10.8 Continue airborne precautions Appreciate input from ID specialist and pulmonologist.    Severe anemia, positive heme stools: Hemoglobin dropped from 8.4-7.3. Monitor H&H and transfuse as needed. S/p transfusion with 1 unit of PRBCs on 12/16/2023.  Hemoglobin was 6.3 on admission. Iron 39, saturation ratio 24, TIBC 165, ferritin 474, folate 12.3. Iron deficiency has been ruled out. No overt GI bleeding. Continue Protonix twice daily for 8 weeks.  Avoid NSAIDs. No plan for endoscopic workup in the inpatient setting.  This can be pursued in the outpatient setting. Outpatient follow-up with gastroenterology as  recommended.   Hypomagnesemia: Improved   Alcohol use disorder: Decrease Librium from 25 mg to 10 mg 3 times daily.  He is on Ativan as needed per CIWA protocol.    Tobacco use disorder: Nicotine patch.   History of hypertension: BP still borderline low.  Hold amlodipine for now.   Comorbidities include history of peptic ulcer disease, history of pancreatitis, history of alcohol withdrawal seizure   Diet Order             Diet regular Room service appropriate? Yes; Fluid consistency: Thin  Diet effective now                            Consultants: ID specialist Gastroenterologist  Procedures: None    Medications:    chlordiazePOXIDE  25 mg Oral TID   feeding supplement  237 mL Oral BID BM   folic acid  1 mg Oral Daily   guaiFENesin-dextromethorphan  10 mL Oral QID   LORazepam  0-4 mg Intravenous Q12H   multivitamin with minerals  1 tablet Oral Daily   nicotine  14 mg Transdermal Daily   pantoprazole (PROTONIX) IV  40 mg Intravenous Q24H   thiamine  100 mg Oral Daily   Or   thiamine  100 mg Intravenous Daily   Continuous Infusions:  piperacillin-tazobactam (ZOSYN)  IV Stopped (12/19/23 1315)     Anti-infectives (From admission, onward)    Start     Dose/Rate Route Frequency Ordered Stop   12/17/23 1800  piperacillin-tazobactam (ZOSYN) IVPB 3.375 g        3.375  g 12.5 mL/hr over 240 Minutes Intravenous Every 8 hours 12/17/23 1738     12/17/23 0700  Ampicillin-Sulbactam (UNASYN) 3 g in sodium chloride 0.9 % 100 mL IVPB  Status:  Discontinued        3 g 200 mL/hr over 30 Minutes Intravenous Every 6 hours 12/17/23 0248 12/17/23 1723   12/17/23 0030  Ampicillin-Sulbactam (UNASYN) 3 g in sodium chloride 0.9 % 100 mL IVPB        3 g 200 mL/hr over 30 Minutes Intravenous  Once 12/17/23 0021 12/17/23 0138              Family Communication/Anticipated D/C date and plan/Code Status   DVT prophylaxis: SCDs Start: 12/17/23 0247     Code  Status: Full Code  Family Communication: Plan discussed with Inetta Fermo, fiance, at the bedside. Disposition Plan: Plan to discharge home   Status is: Inpatient Remains inpatient appropriate because: Cavitary pneumonia       Subjective:   Interval events noted.  He complains of dry cough.  No shortness of breath, chest pain or night sweats.  Inetta Fermo, Steffanie Rainwater, was at the bedside   Objective:    Vitals:   12/18/23 1632 12/18/23 1952 12/19/23 0443 12/19/23 0810  BP: 105/61 117/71 105/63 100/65  Pulse: 98 94 95 87  Resp: 17 16 16 18   Temp: 98.4 F (36.9 C) 98.3 F (36.8 C) 98 F (36.7 C) 97.8 F (36.6 C)  TempSrc:  Oral Oral Oral  SpO2: 97% 100% 97% 100%  Weight:      Height:       No data found.   Intake/Output Summary (Last 24 hours) at 12/19/2023 1438 Last data filed at 12/19/2023 1400 Gross per 24 hour  Intake 729.38 ml  Output 325 ml  Net 404.38 ml   Filed Weights   12/17/23 2225  Weight: 64.6 kg    Exam:  GEN: NAD SKIN: Warm and dry EYES: No pallor or icterus ENT: MMM CV: RRR PULM: CTA B ABD: soft, ND, NT, +BS CNS: AAO x 3, non focal EXT: No edema or tenderness        Data Reviewed:   I have personally reviewed following labs and imaging studies:  Labs: Labs show the following:   Basic Metabolic Panel: Recent Labs  Lab 12/16/23 2216 12/18/23 0209 12/18/23 1641 12/19/23 0246  NA 130* 134* 134*  --   K 3.8 3.7 3.7  --   CL 101 103 104  --   CO2 18* 23 22  --   GLUCOSE 89 126* 128*  --   BUN 5* 8 8  --   CREATININE 0.70 0.73 0.80  --   CALCIUM 8.3* 8.4* 8.1*  --   MG  --  1.2*  --  1.7  PHOS  --  3.8  --  3.7   GFR Estimated Creatinine Clearance: 102.1 mL/min (by C-G formula based on SCr of 0.8 mg/dL). Liver Function Tests: Recent Labs  Lab 12/16/23 2216  AST 21  ALT 9  ALKPHOS 66  BILITOT 0.4  PROT 6.5  ALBUMIN 2.2*   No results for input(s): "LIPASE", "AMYLASE" in the last 168 hours. No results for input(s): "AMMONIA"  in the last 168 hours. Coagulation profile Recent Labs  Lab 12/16/23 2216  INR 1.1    CBC: Recent Labs  Lab 12/16/23 2216 12/17/23 1628 12/17/23 2002 12/18/23 0209 12/18/23 0828 12/18/23 1418 12/19/23 0246  WBC 11.4*  --  11.2* 9.8  --   --  9.2  NEUTROABS 6.4  --   --  6.3  --   --   --   HGB 6.3*   < > 9.6* 8.2* 9.5* 8.4* 7.3*  HCT 18.5*  --  27.8* 23.7*  --   --  21.6*  MCV 90.2  --  87.1 85.6  --   --  88.9  PLT 260  --  298 287  --   --  275   < > = values in this interval not displayed.   Cardiac Enzymes: No results for input(s): "CKTOTAL", "CKMB", "CKMBINDEX", "TROPONINI" in the last 168 hours. BNP (last 3 results) No results for input(s): "PROBNP" in the last 8760 hours. CBG: No results for input(s): "GLUCAP" in the last 168 hours. D-Dimer: No results for input(s): "DDIMER" in the last 72 hours. Hgb A1c: No results for input(s): "HGBA1C" in the last 72 hours. Lipid Profile: No results for input(s): "CHOL", "HDL", "LDLCALC", "TRIG", "CHOLHDL", "LDLDIRECT" in the last 72 hours. Thyroid function studies: No results for input(s): "TSH", "T4TOTAL", "T3FREE", "THYROIDAB" in the last 72 hours.  Invalid input(s): "FREET3" Anemia work up: Recent Labs    12/17/23 1453 12/18/23 0828 12/19/23 0245  VITAMINB12  --  379  --   FOLATE 12.3  --   --   FERRITIN 474*  --   --   TIBC 165*  --   --   IRON 39*  --   --   RETICCTPCT  --   --  3.5*   Sepsis Labs: Recent Labs  Lab 12/16/23 2216 12/17/23 0040 12/17/23 2002 12/18/23 0209 12/19/23 0246  WBC 11.4*  --  11.2* 9.8 9.2  LATICACIDVEN 1.6 1.4  --   --   --     Microbiology Recent Results (from the past 240 hours)  Culture, blood (Routine x 2)     Status: None (Preliminary result)   Collection Time: 12/16/23 10:17 PM   Specimen: BLOOD  Result Value Ref Range Status   Specimen Description BLOOD RIGHT WRIST  Final   Special Requests   Final    BOTTLES DRAWN AEROBIC AND ANAEROBIC Blood Culture results may  not be optimal due to an inadequate volume of blood received in culture bottles   Culture   Final    NO GROWTH 3 DAYS Performed at [facility], [address] [place], [place] [address]    Report Status PENDING  Incomplete  Culture, blood (Routine x 2)     Status: None (Preliminary result)   Collection Time: 12/16/23 10:17 PM   Specimen: BLOOD  Result Value Ref Range Status   Specimen Description BLOOD RIGHT HAND  Final   Special Requests   Final    BOTTLES DRAWN AEROBIC AND ANAEROBIC Blood Culture results may not be optimal due to an inadequate volume of blood received in culture bottles   Culture   Final    NO GROWTH 3 DAYS Performed at [facility], [address], [place], [place] [address]    Report Status PENDING  Incomplete  Resp panel by RT-PCR (RSV, Flu A&B, Covid) Anterior Nasal Swab     Status: None   Collection Time: 12/16/23 10:19 PM   Specimen: Anterior Nasal Swab  Result Value Ref Range Status   SARS Coronavirus 2 by RT PCR NEGATIVE NEGATIVE Final    Comment: (NOTE) SARS-CoV-2 target nucleic acids are NOT DETECTED.  The SARS-CoV-2 RNA is generally detectable in upper respiratory specimens during the acute phase of infection. The  lowest concentration of SARS-CoV-2 viral copies this assay can detect is 138 copies/mL. A negative result does not preclude SARS-Cov-2 infection and should not be used as the sole basis for treatment or other patient management decisions. A negative result may occur with  improper specimen collection/handling, submission of specimen other than nasopharyngeal swab, presence of viral mutation(s) within the areas targeted by this assay, and inadequate number of viral copies(<138 copies/mL). A negative result must be combined with clinical observations, patient history, and epidemiological information. The expected result is Negative.  Fact Sheet for Patients:  BloggerCourse.com  Fact Sheet for  Healthcare Providers:  SeriousBroker.it  This test is no t yet approved or cleared by the [place] FDA and  has been authorized for detection and/or diagnosis of SARS-CoV-2 by FDA under an Emergency Use Authorization (EUA). This EUA will remain  in effect (meaning this test can be used) for the duration of the COVID-19 declaration under Section 564(b)(1) of the Act, 21 U.S.C.section 360bbb-3(b)(1), unless the authorization is terminated  or revoked sooner.       Influenza A by PCR NEGATIVE NEGATIVE Final   Influenza B by PCR NEGATIVE NEGATIVE Final    Comment: (NOTE) The Xpert Xpress SARS-CoV-2/FLU/RSV plus assay is intended as an aid in the diagnosis of influenza from Nasopharyngeal swab specimens and should not be used as a sole basis for treatment. Nasal washings and aspirates are unacceptable for Xpert Xpress SARS-CoV-2/FLU/RSV testing.  Fact Sheet for Patients: BloggerCourse.com  Fact Sheet for Healthcare Providers: SeriousBroker.it  This test is not yet approved or cleared by the [place] FDA and has been authorized for detection and/or diagnosis of SARS-CoV-2 by FDA under an Emergency Use Authorization (EUA). This EUA will remain in effect (meaning this test can be used) for the duration of the COVID-19 declaration under Section 564(b)(1) of the Act, 21 U.S.C. section 360bbb-3(b)(1), unless the authorization is terminated or revoked.     Resp Syncytial Virus by PCR NEGATIVE NEGATIVE Final    Comment: (NOTE) Fact Sheet for Patients: BloggerCourse.com  Fact Sheet for Healthcare Providers: SeriousBroker.it  This test is not yet approved or cleared by the [place] FDA and has been authorized for detection and/or diagnosis of SARS-CoV-2 by FDA under an Emergency Use Authorization (EUA). This EUA will remain in effect (meaning this  test can be used) for the duration of the COVID-19 declaration under Section 564(b)(1) of the Act, 21 U.S.C. section 360bbb-3(b)(1), unless the authorization is terminated or revoked.  Performed at [facility], [address] [place], [place] [address]   MTB-RIF NAA with AFB Culture, sputum (q8 x 3)     Status: None (Preliminary result)   Collection Time: 12/17/23  4:28 AM   Specimen: Sputum  Result Value Ref Range Status   Myco tuberculosis Complex PENDING  Incomplete   Rifampin PENDING  Incomplete   AFB Specimen Processing Concentration  Final    Comment: (NOTE) Performed At: [facility] [address] [place], [place] [id number] Jolene Schimke MD WU:[id number]    Acid Fast Culture PENDING  Incomplete   Source (MTB RIF) SPUTUM  Final    Comment: Performed at [facility], [address] [place], [place] [address]  MTB-RIF NAA with AFB Culture, sputum (q8 x 3)     Status: None (Preliminary result)   Collection Time: 12/17/23  2:54 PM   Specimen: Sputum  Result Value Ref Range Status   Myco tuberculosis Complex PENDING  Incomplete   Rifampin PENDING  Incomplete   AFB Specimen Processing Concentration  Final    Comment: (NOTE) Performed At: [facility] [address] [place], [place] [id number] Jolene Schimke MD ZH:[id number]    Acid Fast Culture PENDING  Incomplete   Source (MTB RIF) SPUTUM  Final    Comment: Performed at [facility], [address] [place], [place] [address]  MRSA Next Gen by PCR, Nasal     Status: None   Collection Time: 12/17/23  8:30 PM   Specimen: Sputum; Nasal Swab  Result Value Ref Range Status   MRSA by PCR Next Gen NOT DETECTED NOT DETECTED Final    Comment: (NOTE) The GeneXpert MRSA Assay (FDA approved for NASAL specimens only), is one component of a comprehensive MRSA colonization surveillance program. It is not intended to diagnose MRSA infection nor to guide or monitor treatment for MRSA  infections. Test performance is not FDA approved in patients less than 71 years old. Performed at [facility], [address] [place], [place] [address]   Expectorated Sputum Assessment w Gram Stain, Rflx to Resp Cult     Status: None   Collection Time: 12/17/23  8:30 PM   Specimen: Sputum  Result Value Ref Range Status   Specimen Description SPU EXPECTORATED SPUTUM  Final   Special Requests NONE  Final   Sputum evaluation   Final    THIS SPECIMEN IS ACCEPTABLE FOR SPUTUM CULTURE Performed at [facility], [address], [place], [place] [address]    Report Status 12/17/2023 FINAL  Final  Culture, Respiratory w Gram Stain     Status: None (Preliminary result)   Collection Time: 12/17/23  8:30 PM   Specimen: Sputum  Result Value Ref Range Status   Specimen Description   Final    SPU EXPECTORATED SPUTUM Performed at [facility], [address] [place], [place] [address]    Special Requests   Final    NONE Reflexed from [id number] Performed at [facility], [address] [place], [place] [address]    Gram Stain   Final    MODERATE WBC PRESENT,BOTH PMN AND MONONUCLEAR FEW GRAM POSITIVE COCCI IN PAIRS RARE GRAM NEGATIVE RODS Performed at [facility], [address], [place], [place] [address]    Culture PENDING  Incomplete   Report Status PENDING  Incomplete    Procedures and diagnostic studies:  No results found.              LOS: 2 days   Erik Burkett  [facility]   Pager on [email or web address]. If 7PM-7AM, please contact night-coverage at [email or web address]     12/19/2023, 2:38 PM         \

## 2023-12-19 NOTE — Progress Notes (Signed)
 PULMONOLOGY         Date: 12/19/2023,   MRN# [id number] Lance Green Jan 11, 1976     AdmissionWeight: 64.6 kg                 CurrentWeight: 64.6 kg  Referring provider: Dr Myriam Forehand   CHIEF COMPLAINT:   Dyspnea and cavitary lung disease    HISTORY OF PRESENT ILLNESS    Lance Green is a 48 y.o. male with medical history significant for essential HTN,  tobacco use, alcohol use disorder with history of an alcohol withdrawal seizure in July 2024, who presented to the ED with 1 month history of cough, shortness of breath and fever and generalized malaise.  Denies chest pain, leg pain or swelling, vomiting or diarrhea. He was noted to be tachycardic and tachypneic on arrival to ER. Labs notable for hemoglobin 6.3, down from 12.6 about 6 months prior, WBC 11.4 with lactic acid 1.6 Sodium 130, bicarb 18 Urinalysis unremarkable COVID flu and RSV negative.  tool for occult blood positive CT chest showing a cavitary lesion right upper lobe-favor necrotizing pneumonia though neoplasm not completely excluded EKG, with mild ischemic changes per my read.  Patient treated with Unasyn, IV Protonix and 1 unit PRBC ordered QuantiFERON gold ordered.  He was noted to have cavitary lung lesion and PCCM is consulted for further evaluation.    12/18/23- patient seen at bedside, no signs of withdrawal from alcohol.  Will place patient on scheduled librium for ppx due to numerous admissions with etoh withdrawal and seizures. His blood cultures are negative x 2 days.  Leukocytosis has resolved. He feels better on current antibiotics and currently saturating 100% on room air.   His CRP is markedly elevated likely due to active infectious process.  I dont think he needs steroids. He has been evaluated by infectious disease specialist who has refined his antimicrobial therapy with change to zosyn and agrees that this is likely related to infection and may be due to klebsiella, anaerobes, strep and also  possible TB which is being investigated.  Patient remains negative pressure isolation.    12/19/23- patient seen at bedside, he's on room air. He coughs more phlegm during exam today. He was seen by GI no signs of bleeding a this time.  He is being followed by ID and it appears his infectious pneumonia is improving.  He reports good sleep no withdrawal symptoms and no episodes of aggitation.  Today is day #4 with no alcohol. Legionella ans step pneumoniae are back and results are negative.  TB workup still in process. Gram stain and cultrure still in process, autoimmune workup still in process.  CBC with h/h decrement noted. G6pd and haptoglobin serology sent in today.   PAST MEDICAL HISTORY   Past Medical History:  Diagnosis Date   Alcohol abuse    HTN (hypertension)    Neuropathy    Pancreatitis    Peptic ulcer disease      SURGICAL HISTORY   Past Surgical History:  Procedure Laterality Date   ANTERIOR CERVICAL DECOMP/DISCECTOMY FUSION N/A 04/02/2022   Procedure: C3-6 ANTERIOR CERVICAL DECOMPRESSION/DISCECTOMY FUSION 3 LEVELS;  Surgeon: Venetia Night, MD;  Location: [facility];  Service: Neurosurgery;  Laterality: N/A;   APPENDECTOMY     INCISION AND DRAINAGE Left 11/11/2018   Procedure: INCISION AND DRAINAGE;  Surgeon: Donato Heinz, MD;  Location: [facility];  Service: Orthopedics;  Laterality: Left;   LAPAROSCOPIC APPENDECTOMY N/A 07/06/2018   Procedure:  APPENDECTOMY LAPAROSCOPIC;  Surgeon: Carolan Shiver, MD;  Location: [facility];  Service: General;  Laterality: N/A;     FAMILY HISTORY   Family History  Problem Relation Age of Onset   Cataracts Mother      SOCIAL HISTORY   Social History   Tobacco Use   Smoking status: Every Day    Current packs/day: 0.50    Types: Cigarettes   Smokeless tobacco: Never  Vaping Use   Vaping status: Never Used  Substance Use Topics   Alcohol use: Yes    Comment: daily 40 oz, reports 4 40oz/day   Drug use: No      MEDICATIONS    Home Medication:     Current Medication:  Current Facility-Administered Medications:    acetaminophen (TYLENOL) tablet 650 mg, 650 mg, Oral, Q6H PRN **OR** acetaminophen (TYLENOL) suppository 650 mg, 650 mg, Rectal, Q6H PRN, Andris Baumann, MD   albuterol (PROVENTIL) (2.5 MG/3ML) 0.083% nebulizer solution 2.5 mg, 2.5 mg, Inhalation, Q6H PRN, Andris Baumann, MD   chlordiazePOXIDE (LIBRIUM) capsule 25 mg, 25 mg, Oral, TID, Lurene Shadow, MD, 25 mg at 12/19/23 0909   feeding supplement (ENSURE ENLIVE / ENSURE PLUS) liquid 237 mL, 237 mL, Oral, BID BM, Lurene Shadow, MD, 237 mL at 12/19/23 0909   folic acid (FOLVITE) tablet 1 mg, 1 mg, Oral, Daily, Lindajo Royal V, MD, 1 mg at 12/19/23 0909   guaiFENesin-dextromethorphan (ROBITUSSIN DM) 100-10 MG/5ML syrup 10 mL, 10 mL, Oral, QID, Lurene Shadow, MD, 10 mL at 12/19/23 0909   [EXPIRED] LORazepam (ATIVAN) injection 0-4 mg, 0-4 mg, Intravenous, Q6H **FOLLOWED BY** LORazepam (ATIVAN) injection 0-4 mg, 0-4 mg, Intravenous, Q12H, Para March, Odetta Pink, MD   LORazepam (ATIVAN) tablet 1-4 mg, 1-4 mg, Oral, Q1H PRN **OR** LORazepam (ATIVAN) injection 1-4 mg, 1-4 mg, Intravenous, Q1H PRN, Andris Baumann, MD   multivitamin with minerals tablet 1 tablet, 1 tablet, Oral, Daily, Andris Baumann, MD, 1 tablet at 12/19/23 1027   nicotine (NICODERM CQ - dosed in mg/24 hours) patch 14 mg, 14 mg, Transdermal, Daily, Lindajo Royal V, MD   ondansetron (ZOFRAN) tablet 4 mg, 4 mg, Oral, Q6H PRN **OR** ondansetron (ZOFRAN) injection 4 mg, 4 mg, Intravenous, Q6H PRN, Andris Baumann, MD   pantoprazole (PROTONIX) injection 40 mg, 40 mg, Intravenous, Q24H, Lindajo Royal V, MD, 40 mg at 12/19/23 1216   piperacillin-tazobactam (ZOSYN) IVPB 3.375 g, 3.375 g, Intravenous, Q8H, Barrie Folk, RPH, Last Rate: 12.5 mL/hr at 12/19/23 0915, 3.375 g at 12/19/23 0915   thiamine (VITAMIN B1) tablet 100 mg, 100 mg, Oral, Daily, 100 mg at 12/19/23 0909 **OR**  thiamine (VITAMIN B1) injection 100 mg, 100 mg, Intravenous, Daily, Andris Baumann, MD    ALLERGIES   Patient has no known allergies.     REVIEW OF SYSTEMS    Review of Systems:  Gen:  Denies  fever, sweats, chills weigh loss  HEENT: Denies blurred vision, double vision, ear pain, eye pain, hearing loss, nose bleeds, sore throat Cardiac:  No dizziness, chest pain or heaviness, chest tightness,edema Resp:   reports dyspnea chronically  Gi: Denies swallowing difficulty, stomach pain, nausea or vomiting, diarrhea, constipation, bowel incontinence Gu:  Denies bladder incontinence, burning urine Ext:   Denies Joint pain, stiffness or swelling Skin: Denies  skin rash, easy bruising or bleeding or hives Endoc:  Denies polyuria, polydipsia , polyphagia or weight change Psych:   Denies depression, insomnia or hallucinations   Other:  All other systems negative  VS: BP 100/65 (BP Location: Right Arm)   Pulse 87   Temp 97.8 F (36.6 C) (Oral)   Resp 18   Ht 5\' 7"  (1.702 m)   Wt 64.6 kg   SpO2 100%   BMI 22.31 kg/m      PHYSICAL EXAM    GENERAL:NAD, no fevers, chills, no weakness no fatigue HEAD: Normocephalic, atraumatic.  EYES: Pupils equal, round, reactive to light. Extraocular muscles intact. No scleral icterus.  MOUTH: Moist mucosal membrane. Dentition intact. No abscess noted.  EAR, NOSE, THROAT: Clear without exudates. No external lesions.  NECK: Supple. No thyromegaly. No nodules. No JVD.  PULMONARY: decreased breath sounds with mild rhonchi worse at bases bilaterally.  CARDIOVASCULAR: S1 and S2. Regular rate and rhythm. No murmurs, rubs, or gallops. No edema. Pedal pulses 2+ bilaterally.  GASTROINTESTINAL: Soft, nontender, nondistended. No masses. Positive bowel sounds. No hepatosplenomegaly.  MUSCULOSKELETAL: No swelling, clubbing, or edema. Range of motion full in all extremities.  NEUROLOGIC: Cranial nerves II through XII are intact. No gross focal  neurological deficits. Sensation intact. Reflexes intact.  SKIN: No ulceration, lesions, rashes, or cyanosis. Skin warm and dry. Turgor intact.  PSYCHIATRIC: Mood, affect within normal limits. The patient is awake, alert and oriented x 3. Insight, judgment intact.       IMAGING      Contains abnormal data C-reactive protein Order: [id number]  Status: Final result     Next appt: None   Test Result Released: No (inaccessible in MyChart)   0 Result Notes    Component Ref Range & Units (hover) 1 d ago  CRP 10.8      Result Notes    Component Ref Range & Units (hover) 1 d ago  Specimen Description SPU EXPECTORATED SPUTUM Performed at [facility], [address] [place], [place] [address]  Special Requests NONE Reflexed from [phone] Performed at [facility], [address] [place], [place] [address]  Gram Stain MODERATE WBC PRESENT,BOTH PMN AND MONONUCLEAR FEW GRAM POSITIVE COCCI IN PAIRS RARE GRAM NEGATIVE RODS Performed at [facility], [address] [place], [place] [address]  Culture PENDING      ASSESSMENT/PLAN   Recurrent pneumonia with cavitary lung process    - AFB pCR x 3    - patient known to have seizure disorder and likely has had multiple episodes of aspiration induced pneumonia.  He may benefit from bronchoscopy.     - Gram stain and cultures - sputum -pendning    - empiric antibiotics- infectious disease is on case - appreciate input-zosyn    - procalcitionin    - MRSA pcr     - CRP trend is elevated     - ANA comprehensive to rule out AI related cavitary lung disease       Acute normocytic anemia  - s/p GI evaluation  -s/p GI evaluation with good report no gastrointestinal abnormalties -he does not have hemoptysis on clinical examination -retic count, g6PD, haptoglobin sent in today    Thank you for allowing me to participate in the care of this patient.   Patient/Family are satisfied with care plan and all  questions have been answered.    Provider disclosure: Patient with at least one acute or chronic illness or injury that poses a threat to life or bodily function and is being managed actively during this encounter.  All of the below services have been performed independently by signing provider:  review of prior documentation from internal  and or external health records.  Review of previous and current lab results.  Interview and comprehensive assessment during patient visit today. Review of current and previous chest radiographs/CT scans. Discussion of management and test interpretation with health care team and patient/family.   This document was prepared using Dragon voice recognition software and may include unintentional dictation errors.     Vida Rigger, M.D.  Division of Pulmonary & Critical Care Medicine

## 2023-12-20 DIAGNOSIS — J984 Other disorders of lung: Secondary | ICD-10-CM | POA: Diagnosis not present

## 2023-12-20 DIAGNOSIS — J189 Pneumonia, unspecified organism: Secondary | ICD-10-CM | POA: Diagnosis not present

## 2023-12-20 LAB — CULTURE, RESPIRATORY W GRAM STAIN

## 2023-12-20 NOTE — Progress Notes (Signed)
 Progress Note    Lance Green  ZOX:[id number] DOB: 17-Oct-1975  DOA: 12/16/2023 PCP: [facility]      Brief Narrative:    Medical records reviewed and are as summarized below:  Lance Green is a 48 y.o. male with medical history significant for hypertension, history of pancreatitis, history of peptic ulcer disease, tobacco use disorder, alcohol use disorder, history of alcohol withdrawal seizure in July 2024, who presented to the hospital with 1 month history of cough, shortness of breath, general weakness, night sweats and weight loss of about 10 pounds.  Diagnostic data significant for hemoglobin of 6.3 and CT chest concerning for right upper lobe cavitary necrotizing pneumonia.      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   Right upper lobe cavitary, necrotizing pneumonia: Continue IV Zosyn.   Strep pneumo urine antigen, Legionella urine antigen and MRSA screen were negative.  ANA comprehensive panel was negative. Fungitell is pending Sputum for AFB and MTB RIF, QuantiFERON-TB gold are pending. CRP elevated, 10.8 Continue airborne precautions Appreciate input from ID specialist and pulmonologist.    Severe anemia, positive heme stools: Hemoglobin dropped from 8.4-7.3. Monitor H&H and transfuse as needed.  Repeat CBC tomorrow S/p transfusion with 1 unit of PRBCs on 12/16/2023.  Hemoglobin was 6.3 on admission. Iron 39, saturation ratio 24, TIBC 165, ferritin 474, folate 12.3. Iron deficiency has been ruled out. No overt GI bleeding. Continue Protonix twice daily for 8 weeks.  Avoid NSAIDs. No plan for endoscopic workup in the inpatient setting.  This can be pursued in the outpatient setting. Outpatient follow-up with  gastroenterology as recommended.   Hypomagnesemia: Improved   Alcohol use disorder: Decrease Librium from 10 mg to 5 mg 3 times daily.  Use Ativan as needed for CIWA protocol   Tobacco use disorder: Nicotine patch.   History of hypertension: BP stable.  Amlodipine is on hold for now.   Comorbidities include history of peptic ulcer disease, history of pancreatitis, history of alcohol withdrawal seizure   Diet Order             Diet regular Room service appropriate? Yes; Fluid consistency: Thin  Diet effective now                            Consultants: ID specialist Gastroenterologist  Procedures: None    Medications:    chlordiazePOXIDE  10 mg Oral TID   feeding supplement  237 mL Oral BID BM   folic acid  1 mg Oral Daily   guaiFENesin-dextromethorphan  10 mL Oral QID   LORazepam  0-4 mg Intravenous Q12H   multivitamin with minerals  1 tablet Oral Daily   nicotine  14 mg Transdermal Daily   pantoprazole  40 mg Oral BID   thiamine  100 mg Oral Daily   Or   thiamine  100 mg Intravenous Daily   Continuous Infusions:  piperacillin-tazobactam (ZOSYN)  IV 3.375 g (12/20/23 0940)     Anti-infectives (From admission, onward)    Start     Dose/Rate Route Frequency Ordered Stop   12/17/23 1800  piperacillin-tazobactam (ZOSYN) IVPB 3.375 g        3.375  g 12.5 mL/hr over 240 Minutes Intravenous Every 8 hours 12/17/23 1738     12/17/23 0700  Ampicillin-Sulbactam (UNASYN) 3 g in sodium chloride 0.9 % 100 mL IVPB  Status:  Discontinued        3 g 200 mL/hr over 30 Minutes Intravenous Every 6 hours 12/17/23 0248 12/17/23 1723   12/17/23 0030  Ampicillin-Sulbactam (UNASYN) 3 g in sodium chloride 0.9 % 100 mL IVPB        3 g 200 mL/hr over 30 Minutes Intravenous  Once 12/17/23 0021 12/17/23 0138              Family Communication/Anticipated D/C date and plan/Code Status   DVT prophylaxis: SCDs Start: 12/17/23 0247     Code Status: Full  Code  Family Communication: Plan discussed with Inetta Fermo, fiance, at the bedside. Disposition Plan: Plan to discharge home   Status is: Inpatient Remains inpatient appropriate because: Cavitary pneumonia       Subjective:   No acute events noted.  He feels better but he still has a cough that is not bringing up phlegm like it supposed to.  He feels the phlegm is stuck in his throat.  Inetta Fermo, significant other, was at the bedside   Objective:    Vitals:   12/19/23 1708 12/19/23 2042 12/20/23 0544 12/20/23 0829  BP: 111/66 111/64 107/66 110/63  Pulse: 96 99 98 87  Resp: 20 16 18 18   Temp: 98.1 F (36.7 C) 99.5 F (37.5 C) 98.9 F (37.2 C) 98 F (36.7 C)  TempSrc: Oral  Oral Oral  SpO2: 99% 97% 100% 98%  Weight:      Height:       No data found.   Intake/Output Summary (Last 24 hours) at 12/20/2023 1208 Last data filed at 12/20/2023 0940 Gross per 24 hour  Intake 637.4 ml  Output 400 ml  Net 237.4 ml   Filed Weights   12/17/23 2225  Weight: 64.6 kg    Exam:  GEN: NAD SKIN: Warm and dry EYES: No pallor or icterus ENT: MMM CV: RRR PULM: CTA B ABD: soft, ND, NT, +BS CNS: AAO x 3, non focal EXT: No edema or tenderness       Data Reviewed:   I have personally reviewed following labs and imaging studies:  Labs: Labs show the following:   Basic Metabolic Panel: Recent Labs  Lab 12/16/23 2216 12/18/23 0209 12/18/23 1641 12/19/23 0246  NA 130* 134* 134*  --   K 3.8 3.7 3.7  --   CL 101 103 104  --   CO2 18* 23 22  --   GLUCOSE 89 126* 128*  --   BUN 5* 8 8  --   CREATININE 0.70 0.73 0.80  --   CALCIUM 8.3* 8.4* 8.1*  --   MG  --  1.2*  --  1.7  PHOS  --  3.8  --  3.7   GFR Estimated Creatinine Clearance: 102.1 mL/min (by C-G formula based on SCr of 0.8 mg/dL). Liver Function Tests: Recent Labs  Lab 12/16/23 2216  AST 21  ALT 9  ALKPHOS 66  BILITOT 0.4  PROT 6.5  ALBUMIN 2.2*   No results for input(s): "LIPASE", "AMYLASE" in the  last 168 hours. No results for input(s): "AMMONIA" in the last 168 hours. Coagulation profile Recent Labs  Lab 12/16/23 2216  INR 1.1    CBC: Recent Labs  Lab 12/16/23 2216 12/17/23 1628 12/17/23 2002 12/18/23 0209 12/18/23 0828 12/18/23  1418 12/19/23 0246  WBC 11.4*  --  11.2* 9.8  --   --  9.2  NEUTROABS 6.4  --   --  6.3  --   --   --   HGB 6.3*   < > 9.6* 8.2* 9.5* 8.4* 7.3*  HCT 18.5*  --  27.8* 23.7*  --   --  21.6*  MCV 90.2  --  87.1 85.6  --   --  88.9  PLT 260  --  298 287  --   --  275   < > = values in this interval not displayed.   Cardiac Enzymes: No results for input(s): "CKTOTAL", "CKMB", "CKMBINDEX", "TROPONINI" in the last 168 hours. BNP (last 3 results) No results for input(s): "PROBNP" in the last 8760 hours. CBG: No results for input(s): "GLUCAP" in the last 168 hours. D-Dimer: No results for input(s): "DDIMER" in the last 72 hours. Hgb A1c: No results for input(s): "HGBA1C" in the last 72 hours. Lipid Profile: No results for input(s): "CHOL", "HDL", "LDLCALC", "TRIG", "CHOLHDL", "LDLDIRECT" in the last 72 hours. Thyroid function studies: No results for input(s): "TSH", "T4TOTAL", "T3FREE", "THYROIDAB" in the last 72 hours.  Invalid input(s): "FREET3" Anemia work up: Recent Labs    12/17/23 1453 12/18/23 0828 12/19/23 0245  VITAMINB12  --  379  --   FOLATE 12.3  --   --   FERRITIN 474*  --   --   TIBC 165*  --   --   IRON 39*  --   --   RETICCTPCT  --   --  3.5*   Sepsis Labs: Recent Labs  Lab 12/16/23 2216 12/17/23 0040 12/17/23 2002 12/18/23 0209 12/19/23 0246  WBC 11.4*  --  11.2* 9.8 9.2  LATICACIDVEN 1.6 1.4  --   --   --     Microbiology Recent Results (from the past 240 hours)  Culture, blood (Routine x 2)     Status: None (Preliminary result)   Collection Time: 12/16/23 10:17 PM   Specimen: BLOOD  Result Value Ref Range Status   Specimen Description BLOOD RIGHT WRIST  Final   Special Requests   Final    BOTTLES  DRAWN AEROBIC AND ANAEROBIC Blood Culture results may not be optimal due to an inadequate volume of blood received in culture bottles   Culture   Final    NO GROWTH 4 DAYS Performed at [facility], [address], [place], [place] [address]    Report Status PENDING  Incomplete  Culture, blood (Routine x 2)     Status: None (Preliminary result)   Collection Time: 12/16/23 10:17 PM   Specimen: BLOOD  Result Value Ref Range Status   Specimen Description BLOOD RIGHT HAND  Final   Special Requests   Final    BOTTLES DRAWN AEROBIC AND ANAEROBIC Blood Culture results may not be optimal due to an inadequate volume of blood received in culture bottles   Culture   Final    NO GROWTH 4 DAYS Performed at [facility], [address], [place], [place] [address]    Report Status PENDING  Incomplete  Resp panel by RT-PCR (RSV, Flu A&B, Covid) Anterior Nasal Swab     Status: None   Collection Time: 12/16/23 10:19 PM   Specimen: Anterior Nasal Swab  Result Value Ref Range Status   SARS Coronavirus 2 by RT PCR NEGATIVE NEGATIVE Final    Comment: (NOTE) SARS-CoV-2 target nucleic acids are NOT DETECTED.  The SARS-CoV-2 RNA is generally detectable in upper respiratory specimens during the acute phase of infection. The lowest concentration of SARS-CoV-2 viral copies this assay can detect is 138 copies/mL. A negative result does not preclude SARS-Cov-2 infection and should not be used as the sole basis for treatment or other patient management decisions. A negative result may occur with  improper specimen collection/handling, submission of specimen other than nasopharyngeal swab, presence of viral mutation(s) within the areas targeted by this assay, and inadequate number of viral copies(<138 copies/mL). A negative result must be combined with clinical observations, patient history, and epidemiological information. The expected result is Negative.  Fact Sheet for Patients:   BloggerCourse.com  Fact Sheet for Healthcare Providers:  SeriousBroker.it  This test is no t yet approved or cleared by the [place] FDA and  has been authorized for detection and/or diagnosis of SARS-CoV-2 by FDA under an Emergency Use Authorization (EUA). This EUA will remain  in effect (meaning this test can be used) for the duration of the COVID-19 declaration under Section 564(b)(1) of the Act, 21 U.S.C.section 360bbb-3(b)(1), unless the authorization is terminated  or revoked sooner.       Influenza A by PCR NEGATIVE NEGATIVE Final   Influenza B by PCR NEGATIVE NEGATIVE Final    Comment: (NOTE) The Xpert Xpress SARS-CoV-2/FLU/RSV plus assay is intended as an aid in the diagnosis of influenza from Nasopharyngeal swab specimens and should not be used as a sole basis for treatment. Nasal washings and aspirates are unacceptable for Xpert Xpress SARS-CoV-2/FLU/RSV testing.  Fact Sheet for Patients: BloggerCourse.com  Fact Sheet for Healthcare Providers: SeriousBroker.it  This test is not yet approved or cleared by the [place] FDA and has been authorized for detection and/or diagnosis of SARS-CoV-2 by FDA under an Emergency Use Authorization (EUA). This EUA will remain in effect (meaning this test can be used) for the duration of the COVID-19 declaration under Section 564(b)(1) of the Act, 21 U.S.C. section 360bbb-3(b)(1), unless the authorization is terminated or revoked.     Resp Syncytial Virus by PCR NEGATIVE NEGATIVE Final    Comment: (NOTE) Fact Sheet for Patients: BloggerCourse.com  Fact Sheet for Healthcare Providers: SeriousBroker.it  This test is not yet approved or cleared by the [place] FDA and has been authorized for detection and/or diagnosis of SARS-CoV-2 by FDA under an Emergency Use  Authorization (EUA). This EUA will remain in effect (meaning this test can be used) for the duration of the COVID-19 declaration under Section 564(b)(1) of the Act, 21 U.S.C. section 360bbb-3(b)(1), unless the authorization is terminated or revoked.  Performed at [facility], [address] [place], [place] [address]   MTB-RIF NAA with AFB Culture, sputum (q8 x 3)     Status: None (Preliminary result)   Collection Time: 12/17/23  4:28 AM   Specimen: Sputum  Result Value Ref Range Status   Myco tuberculosis Complex PENDING  Incomplete   Rifampin PENDING  Incomplete   AFB Specimen Processing Concentration  Final    Comment: (NOTE) Performed At: [facility] [address] [place], [place] [id number] Jolene Schimke MD ZD:[id number]    Acid Fast Culture PENDING  Incomplete   Source (MTB RIF) SPUTUM  Final    Comment: Performed at [facility], [address] [place], [place] [address]  MTB-RIF NAA with AFB Culture, sputum (q8 x 3)     Status: None (Preliminary result)   Collection Time: 12/17/23  2:54 PM   Specimen: Sputum  Result Value Ref Range Status   Myco tuberculosis Complex PENDING  Incomplete   Rifampin PENDING  Incomplete   AFB Specimen Processing Concentration  Final    Comment: (NOTE) Performed At: [facility] [address] [place], [place] [id number] Jolene Schimke MD NW:[id number]    Acid Fast Culture PENDING  Incomplete   Source (MTB RIF) SPUTUM  Final    Comment: Performed at [facility], [address] [place], [place] [address]  MTB-RIF NAA with AFB Culture, sputum (q8 x 3)     Status: None (Preliminary result)   Collection Time: 12/17/23  8:30 PM   Specimen: Sputum  Result Value Ref Range Status   Myco tuberculosis Complex PENDING  Incomplete   Rifampin PENDING  Incomplete   AFB Specimen Processing Concentration  Final    Comment: (NOTE) Performed At: [facility] [address] [place], [place]  [id number] Jolene Schimke MD LK:[id number]    Acid Fast Culture PENDING  Incomplete   Source (MTB RIF) SPU  Final    Comment: Performed at [facility], [address] [place], [place] [address]  MRSA Next Gen by PCR, Nasal     Status: None   Collection Time: 12/17/23  8:30 PM   Specimen: Sputum; Nasal Swab  Result Value Ref Range Status   MRSA by PCR Next Gen NOT DETECTED NOT DETECTED Final    Comment: (NOTE) The GeneXpert MRSA Assay (FDA approved for NASAL specimens only), is one component of a comprehensive MRSA colonization surveillance program. It is not intended to diagnose MRSA infection nor to guide or monitor treatment for MRSA infections. Test performance is not FDA approved in patients less than 48 years old. Performed at [facility], [address] [place], [place] [address]   Expectorated Sputum Assessment w Gram Stain, Rflx to Resp Cult     Status: None   Collection Time: 12/17/23  8:30 PM   Specimen: Sputum  Result Value Ref Range Status   Specimen Description SPU EXPECTORATED SPUTUM  Final   Special Requests NONE  Final   Sputum evaluation   Final    THIS SPECIMEN IS ACCEPTABLE FOR SPUTUM CULTURE Performed at [facility], [address], [place], [place] [address]    Report Status 12/17/2023 FINAL  Final  Culture, Respiratory w Gram Stain     Status: None (Preliminary result)   Collection Time: 12/17/23  8:30 PM   Specimen: Sputum  Result Value Ref Range Status   Specimen Description   Final    SPU EXPECTORATED SPUTUM Performed at [facility], [address], [place], [place] [address]    Special Requests   Final    NONE Reflexed from [id number] Performed at [facility], [address] [place], [place] [address]    Gram Stain   Final    MODERATE WBC PRESENT,BOTH PMN AND MONONUCLEAR FEW GRAM POSITIVE COCCI IN PAIRS RARE GRAM NEGATIVE RODS    Culture   Final    FEW KLEBSIELLA  PNEUMONIAE SUSCEPTIBILITIES TO FOLLOW Performed at [facility], [address] [place], [place] [address]    Report Status PENDING  Incomplete    Procedures and diagnostic studies:  No results found.              LOS: 3 days   Hajar Penninger  [facility]   Pager on [email or web address]. If 7PM-7AM, please contact night-coverage at [email or web address]     12/20/2023, 12:08 PM         \

## 2023-12-20 NOTE — Progress Notes (Signed)
 Date of Admission:  12/16/2023     ID: Lance Green is a 48 y.o. 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   Tobacco use disorder   Alcohol use disorder   Anemia    Subjective: Feeling better Some cough  Medications:   chlordiazePOXIDE  10 mg Oral TID   feeding supplement  237 mL Oral BID BM   folic acid  1 mg Oral Daily   guaiFENesin-dextromethorphan  10 mL Oral QID   LORazepam  0-4 mg Intravenous Q12H   multivitamin with minerals  1 tablet Oral Daily   nicotine  14 mg Transdermal Daily   pantoprazole  40 mg Oral BID   thiamine  100 mg Oral Daily   Or   thiamine  100 mg Intravenous Daily    Objective: Vital signs in last 24 hours: Patient Vitals for the past 24 hrs:  BP Temp Temp src Pulse Resp SpO2  12/20/23 0829 110/63 98 F (36.7 C) Oral 87 18 98 %  12/20/23 0544 107/66 98.9 F (37.2 C) Oral 98 18 100 %  12/19/23 2042 111/64 99.5 F (37.5 C) -- 99 16 97 %  12/19/23 1708 111/66 98.1 F (36.7 C) Oral 96 20 99 %     PHYSICAL EXAM:  General: Alert, cooperative, no distress, appears stated age.  Lungs: b/l air entry- crepts.rt side Heart: Regular rate and rhythm, no murmur, rub or gallop. Abdomen: Soft, non-tender,not distended. Bowel sounds normal. No masses Extremities: atraumatic, no cyanosis. No edema. No clubbing Skin: No rashes or lesions. Or bruising Lymph: Cervical, supraclavicular normal. Neurologic: Grossly non-focal  Lab Results    Latest Ref Rng & Units 12/19/2023    2:46 AM 12/18/2023    2:18 PM 12/18/2023    8:28 AM  CBC  WBC 4.0 - 10.5 K/uL 9.2     Hemoglobin 13.0 - 17.0 g/dL 7.3  8.4  9.5   Hematocrit 39.0 - 52.0 % 21.6     Platelets 150 - 400 K/uL 275          Latest Ref Rng & Units 12/18/2023    4:41 PM 12/18/2023    2:09 AM 12/16/2023   10:16 PM  CMP  Glucose 70 - 99 mg/dL 308  657  89   BUN 6 - 20 mg/dL 8  8  5     Creatinine 0.61 - 1.24 mg/dL 8.46  9.62  9.52   Sodium 135 - 145 mmol/L 134  134  130   Potassium 3.5 - 5.1 mmol/L 3.7  3.7  3.8   Chloride 98 - 111 mmol/L 104  103  101   CO2 22 - 32 mmol/L 22  23  18    Calcium 8.9 - 10.3 mg/dL 8.1  8.4  8.3   Total Protein 6.5 - 8.1 g/dL   6.5   Total Bilirubin 0.0 - 1.2 mg/dL   0.4   Alkaline Phos 38 - 126 U/L   66   AST 15 - 41 U/L   21   ALT 0 - 44 U/L   9       Microbiology: 3/12 BC NG Studies/Results: No results found.    Assessment/Plan: 48 year old male with history of alcohol abuse, previous left lung abscess negative for AFB, ACDF C3-C6 presents with cough of more than a months duration productive of sputum and shortness of breath of a  few days duration worse in the recent 48 hours.  Nocturnal sweats and weakness   Cavitary pneumonia on the right upper lobe extending to the lower lobe There is a thick-walled cavity very likely related to alcohol use aspiration and necrotizing pneumonia leading out to a cavitary lesion.  The differential diagnosis would be tuberculosis especially with his alcohol use may need to rule out TB. Atypical mycobacteria  possible On airborne isolation 3 sputums for AFB sent Patient is on  Zosyn  to treat  Klebsiella which he is at high risk for.   other organisms are likely Streptococcus and anaerobes   Non infectious cause would be vasculitis  Anemia   Hyponatremia secondary to the lung lesion likely   History of ACDF C3-C6 ? Discussed the management with the patient

## 2023-12-20 NOTE — Plan of Care (Signed)
  Problem: Health Behavior/Discharge Planning: Goal: Ability to manage health-related needs will improve Outcome: Progressing   Problem: Clinical Measurements: Goal: Diagnostic test results will improve Outcome: Progressing   Problem: Activity: Goal: Risk for activity intolerance will decrease Outcome: Progressing   Problem: Nutrition: Goal: Adequate nutrition will be maintained Outcome: Progressing   Problem: Coping: Goal: Level of anxiety will decrease Outcome: Progressing   Problem: Safety: Goal: Ability to remain free from injury will improve Outcome: Progressing

## 2023-12-20 NOTE — Progress Notes (Signed)
 PULMONOLOGY         Date: 12/20/2023,   MRN# [id number] TODDY Green 07/15/1976     AdmissionWeight: 64.6 kg                 CurrentWeight: 64.6 kg  Referring provider: Dr Myriam Forehand   CHIEF COMPLAINT:   Dyspnea and cavitary lung disease    HISTORY OF PRESENT ILLNESS    Lance Green is a 48 y.o. male with medical history significant for essential HTN,  tobacco use, alcohol use disorder with history of an alcohol withdrawal seizure in July 2024, who presented to the ED with 1 month history of cough, shortness of breath and fever and generalized malaise.  Denies chest pain, leg pain or swelling, vomiting or diarrhea. He was noted to be tachycardic and tachypneic on arrival to ER. Labs notable for hemoglobin 6.3, down from 12.6 about 6 months prior, WBC 11.4 with lactic acid 1.6 Sodium 130, bicarb 18 Urinalysis unremarkable COVID flu and RSV negative.  tool for occult blood positive CT chest showing a cavitary lesion right upper lobe-favor necrotizing pneumonia though neoplasm not completely excluded EKG, with mild ischemic changes per my read.  Patient treated with Unasyn, IV Protonix and 1 unit PRBC ordered QuantiFERON gold ordered.  He was noted to have cavitary lung lesion and PCCM is consulted for further evaluation.    12/18/23- patient seen at bedside, no signs of withdrawal from alcohol.  Will place patient on scheduled librium for ppx due to numerous admissions with etoh withdrawal and seizures. His blood cultures are negative x 2 days.  Leukocytosis has resolved. He feels better on current antibiotics and currently saturating 100% on room air.   His CRP is markedly elevated likely due to active infectious process.  I dont think he needs steroids. He has been evaluated by infectious disease specialist who has refined his antimicrobial therapy with change to zosyn and agrees that this is likely related to infection and may be due to klebsiella, anaerobes, strep and also  possible TB which is being investigated.  Patient remains negative pressure isolation.    12/19/23- patient seen at bedside, he's on room air. He coughs more phlegm during exam today. He was seen by GI no signs of bleeding a this time.  He is being followed by ID and it appears his infectious pneumonia is improving.  He reports good sleep no withdrawal symptoms and no episodes of aggitation.  Today is day #4 with no alcohol. Legionella ans step pneumoniae are back and results are negative.  TB workup still in process. Gram stain and cultrure still in process, autoimmune workup still in process.  CBC with h/h decrement noted. G6pd and haptoglobin serology sent in today.    12/20/23- patient with Klebsiella pneumonia per resp cultures.  He is on room air.  He is stable clinically and his sensitivity panel shows PO options available for treatment.  Will send message to team regarding outpatient treatment and follow up despite TB workup still pending.   PAST MEDICAL HISTORY   Past Medical History:  Diagnosis Date   Alcohol abuse    HTN (hypertension)    Neuropathy    Pancreatitis    Peptic ulcer disease      SURGICAL HISTORY   Past Surgical History:  Procedure Laterality Date   ANTERIOR CERVICAL DECOMP/DISCECTOMY FUSION N/A 04/02/2022   Procedure: C3-6 ANTERIOR CERVICAL DECOMPRESSION/DISCECTOMY FUSION 3 LEVELS;  Surgeon: Venetia Night, MD;  Location: [facility];  Service: Neurosurgery;  Laterality: N/A;   APPENDECTOMY     INCISION AND DRAINAGE Left 11/11/2018   Procedure: INCISION AND DRAINAGE;  Surgeon: Donato Heinz, MD;  Location: [facility];  Service: Orthopedics;  Laterality: Left;   LAPAROSCOPIC APPENDECTOMY N/A 07/06/2018   Procedure: APPENDECTOMY LAPAROSCOPIC;  Surgeon: Carolan Shiver, MD;  Location: [facility];  Service: General;  Laterality: N/A;     FAMILY HISTORY   Family History  Problem Relation Age of Onset   Cataracts Mother      SOCIAL HISTORY   Social  History   Tobacco Use   Smoking status: Every Day    Current packs/day: 0.50    Types: Cigarettes   Smokeless tobacco: Never  Vaping Use   Vaping status: Never Used  Substance Use Topics   Alcohol use: Yes    Comment: daily 40 oz, reports 4 40oz/day   Drug use: No     MEDICATIONS    Home Medication:     Current Medication:  Current Facility-Administered Medications:    acetaminophen (TYLENOL) tablet 650 mg, 650 mg, Oral, Q6H PRN **OR** acetaminophen (TYLENOL) suppository 650 mg, 650 mg, Rectal, Q6H PRN, Andris Baumann, MD   albuterol (PROVENTIL) (2.5 MG/3ML) 0.083% nebulizer solution 2.5 mg, 2.5 mg, Inhalation, Q6H PRN, Andris Baumann, MD   chlordiazePOXIDE (LIBRIUM) capsule 10 mg, 10 mg, Oral, TID, Lurene Shadow, MD, 10 mg at 12/20/23 0936   feeding supplement (ENSURE ENLIVE / ENSURE PLUS) liquid 237 mL, 237 mL, Oral, BID BM, Lurene Shadow, MD, 237 mL at 12/20/23 1308   folic acid (FOLVITE) tablet 1 mg, 1 mg, Oral, Daily, Lindajo Royal V, MD, 1 mg at 12/20/23 0936   guaiFENesin-dextromethorphan (ROBITUSSIN DM) 100-10 MG/5ML syrup 10 mL, 10 mL, Oral, QID, Lurene Shadow, MD, 10 mL at 12/20/23 0936   [EXPIRED] LORazepam (ATIVAN) injection 0-4 mg, 0-4 mg, Intravenous, Q6H **FOLLOWED BY** LORazepam (ATIVAN) injection 0-4 mg, 0-4 mg, Intravenous, Q12H, Andris Baumann, MD   multivitamin with minerals tablet 1 tablet, 1 tablet, Oral, Daily, Andris Baumann, MD, 1 tablet at 12/20/23 6578   nicotine (NICODERM CQ - dosed in mg/24 hours) patch 14 mg, 14 mg, Transdermal, Daily, Lindajo Royal V, MD   ondansetron (ZOFRAN) tablet 4 mg, 4 mg, Oral, Q6H PRN **OR** ondansetron (ZOFRAN) injection 4 mg, 4 mg, Intravenous, Q6H PRN, Andris Baumann, MD   pantoprazole (PROTONIX) EC tablet 40 mg, 40 mg, Oral, BID, Lurene Shadow, MD, 40 mg at 12/20/23 0936   piperacillin-tazobactam (ZOSYN) IVPB 3.375 g, 3.375 g, Intravenous, Q8H, Barrie Folk, RPH, Last Rate: 12.5 mL/hr at 12/20/23 0940,  3.375 g at 12/20/23 0940   thiamine (VITAMIN B1) tablet 100 mg, 100 mg, Oral, Daily, 100 mg at 12/20/23 0936 **OR** thiamine (VITAMIN B1) injection 100 mg, 100 mg, Intravenous, Daily, Andris Baumann, MD    ALLERGIES   Patient has no known allergies.     REVIEW OF SYSTEMS    Review of Systems:  Gen:  Denies  fever, sweats, chills weigh loss  HEENT: Denies blurred vision, double vision, ear pain, eye pain, hearing loss, nose bleeds, sore throat Cardiac:  No dizziness, chest pain or heaviness, chest tightness,edema Resp:   reports dyspnea chronically  Gi: Denies swallowing difficulty, stomach pain, nausea or vomiting, diarrhea, constipation, bowel incontinence Gu:  Denies bladder incontinence, burning urine Ext:   Denies Joint pain, stiffness or swelling Skin: Denies  skin rash, easy bruising or bleeding or hives Endoc:  Denies polyuria, polydipsia ,  polyphagia or weight change Psych:   Denies depression, insomnia or hallucinations   Other:  All other systems negative   VS: BP 111/71 (BP Location: Left Arm)   Pulse 89   Temp 98.4 F (36.9 C) (Oral)   Resp 18   Ht 5\' 7"  (1.702 m)   Wt 64.6 kg   SpO2 100%   BMI 22.31 kg/m      PHYSICAL EXAM    GENERAL:NAD, no fevers, chills, no weakness no fatigue HEAD: Normocephalic, atraumatic.  EYES: Pupils equal, round, reactive to light. Extraocular muscles intact. No scleral icterus.  MOUTH: Moist mucosal membrane. Dentition intact. No abscess noted.  EAR, NOSE, THROAT: Clear without exudates. No external lesions.  NECK: Supple. No thyromegaly. No nodules. No JVD.  PULMONARY: decreased breath sounds with mild rhonchi worse at bases bilaterally.  CARDIOVASCULAR: S1 and S2. Regular rate and rhythm. No murmurs, rubs, or gallops. No edema. Pedal pulses 2+ bilaterally.  GASTROINTESTINAL: Soft, nontender, nondistended. No masses. Positive bowel sounds. No hepatosplenomegaly.  MUSCULOSKELETAL: No swelling, clubbing, or edema. Range  of motion full in all extremities.  NEUROLOGIC: Cranial nerves II through XII are intact. No gross focal neurological deficits. Sensation intact. Reflexes intact.  SKIN: No ulceration, lesions, rashes, or cyanosis. Skin warm and dry. Turgor intact.  PSYCHIATRIC: Mood, affect within normal limits. The patient is awake, alert and oriented x 3. Insight, judgment intact.       IMAGING      Contains abnormal data C-reactive protein Order: [id number]  Status: Final result     Next appt: None   Test Result Released: No (inaccessible in MyChart)   0 Result Notes    Component Ref Range & Units (hover) 1 d ago  CRP 10.8      Result Notes    Component Ref Range & Units (hover) 1 d ago  Specimen Description SPU EXPECTORATED SPUTUM Performed at [facility], [address] [place], [place] [address]  Special Requests NONE Reflexed from [phone] Performed at [facility], [address] [place], [place] [address]  Gram Stain MODERATE WBC PRESENT,BOTH PMN AND MONONUCLEAR FEW GRAM POSITIVE COCCI IN PAIRS RARE GRAM NEGATIVE RODS Performed at [facility], [address] [place], [place] [address]  Culture PENDING      ASSESSMENT/PLAN   Recurrent pneumonia with cavitary lung process    - AFB pCR x 3    - patient known to have seizure disorder and likely has had multiple episodes of aspiration induced pneumonia.  He may benefit from bronchoscopy.     - Gram stain and cultures - sputum -Klebsiella with good sensitivity noted     - empiric antibiotics- infectious disease is on case - appreciate input-hopefully can go home on po meds    - procalcitionin    - MRSA pcr     - CRP trend is elevated     - ANA comprehensive is negative       Acute normocytic anemia  - s/p GI evaluation  -s/p GI evaluation with good report no gastrointestinal abnormalties -he does not have hemoptysis on clinical examination -retic count, g6PD, haptoglobin sent in  today    Thank you for allowing me to participate in the care of this patient.   Patient/Family are satisfied with care plan and all questions have been answered.    Provider disclosure: Patient with at least one acute or chronic illness or injury that poses a threat to life or bodily function and  is being managed actively during this encounter.  All of the below services have been performed independently by signing provider:  review of prior documentation from internal and or external health records.  Review of previous and current lab results.  Interview and comprehensive assessment during patient visit today. Review of current and previous chest radiographs/CT scans. Discussion of management and test interpretation with health care team and patient/family.   This document was prepared using Dragon voice recognition software and may include unintentional dictation errors.     Vida Rigger, M.D.  Division of Pulmonary & Critical Care Medicine

## 2023-12-21 DIAGNOSIS — J984 Other disorders of lung: Secondary | ICD-10-CM | POA: Diagnosis not present

## 2023-12-21 DIAGNOSIS — J189 Pneumonia, unspecified organism: Secondary | ICD-10-CM | POA: Diagnosis not present

## 2023-12-21 LAB — CBC
HCT: 24.1 % — ABNORMAL LOW (ref 39.0–52.0)
Hemoglobin: 8 g/dL — ABNORMAL LOW (ref 13.0–17.0)
MCH: 29.6 pg (ref 26.0–34.0)
MCHC: 33.2 g/dL (ref 30.0–36.0)
MCV: 89.3 fL (ref 80.0–100.0)
Platelets: 297 10*3/uL (ref 150–400)
RBC: 2.7 MIL/uL — ABNORMAL LOW (ref 4.22–5.81)
RDW: 14.2 % (ref 11.5–15.5)
WBC: 7.8 10*3/uL (ref 4.0–10.5)
nRBC: 0 % (ref 0.0–0.2)

## 2023-12-21 LAB — CULTURE, BLOOD (ROUTINE X 2)
Culture: NO GROWTH
Culture: NO GROWTH

## 2023-12-21 LAB — ACID FAST SMEAR (AFB, MYCOBACTERIA): Acid Fast Smear: NEGATIVE

## 2023-12-21 LAB — QUANTIFERON-TB GOLD PLUS (RQFGPL)
QuantiFERON Mitogen Value: 0.47 [IU]/mL
QuantiFERON Nil Value: 0.13 [IU]/mL
QuantiFERON TB1 Ag Value: 0.09 [IU]/mL
QuantiFERON TB2 Ag Value: 0.09 [IU]/mL

## 2023-12-21 LAB — BASIC METABOLIC PANEL
Anion gap: 6 (ref 5–15)
BUN: 9 mg/dL (ref 6–20)
CO2: 27 mmol/L (ref 22–32)
Calcium: 8.4 mg/dL — ABNORMAL LOW (ref 8.9–10.3)
Chloride: 106 mmol/L (ref 98–111)
Creatinine, Ser: 0.88 mg/dL (ref 0.61–1.24)
GFR, Estimated: >60 mL/min (ref >60)
Glucose, Bld: 135 mg/dL — ABNORMAL HIGH (ref 70–99)
Potassium: 3.8 mmol/L (ref 3.5–5.1)
Sodium: 139 mmol/L (ref 135–145)

## 2023-12-21 LAB — QUANTIFERON-TB GOLD PLUS: QuantiFERON-TB Gold Plus: UNDETERMINED — AB

## 2023-12-21 LAB — MAGNESIUM: Magnesium: 1.5 mg/dL — ABNORMAL LOW (ref 1.7–2.4)

## 2023-12-21 MED ORDER — MAGNESIUM SULFATE 2 GM/50ML IV SOLN
2.0000 g | Freq: Once | INTRAVENOUS | Status: AC
  Administered 2023-12-21: 2 g via INTRAVENOUS
  Filled 2023-12-21: qty 50

## 2023-12-21 MED ORDER — AMOXICILLIN-POT CLAVULANATE 875-125 MG PO TABS: 1.0000 | ORAL_TABLET | Freq: Two times a day (BID) | ORAL | 0 refills | Status: DC

## 2023-12-21 MED ORDER — SULFAMETHOXAZOLE-TRIMETHOPRIM 800-160 MG PO TABS: 1.0000 | ORAL_TABLET | Freq: Two times a day (BID) | ORAL | 0 refills | Status: DC

## 2023-12-21 MED ORDER — PANTOPRAZOLE SODIUM 40 MG PO TBEC: 40.0000 mg | DELAYED_RELEASE_TABLET | Freq: Two times a day (BID) | ORAL | 1 refills | Status: AC

## 2023-12-21 NOTE — Progress Notes (Signed)
 PULMONOLOGY         Date: 12/21/2023,   MRN# [id number] Lance Green 20-Sep-1976     AdmissionWeight: 64.6 kg                 CurrentWeight: 64.6 kg  Referring provider: Dr Myriam Forehand   CHIEF COMPLAINT:   Dyspnea and cavitary lung disease    HISTORY OF PRESENT ILLNESS    Lance Green is a 48 y.o. male with medical history significant for essential HTN,  tobacco use, alcohol use disorder with history of an alcohol withdrawal seizure in July 2024, who presented to the ED with 1 month history of cough, shortness of breath and fever and generalized malaise.  Denies chest pain, leg pain or swelling, vomiting or diarrhea. He was noted to be tachycardic and tachypneic on arrival to ER. Labs notable for hemoglobin 6.3, down from 12.6 about 6 months prior, WBC 11.4 with lactic acid 1.6 Sodium 130, bicarb 18 Urinalysis unremarkable COVID flu and RSV negative.  tool for occult blood positive CT chest showing a cavitary lesion right upper lobe-favor necrotizing pneumonia though neoplasm not completely excluded EKG, with mild ischemic changes per my read.  Patient treated with Unasyn, IV Protonix and 1 unit PRBC ordered QuantiFERON gold ordered.  He was noted to have cavitary lung lesion and PCCM is consulted for further evaluation.    12/18/23- patient seen at bedside, no signs of withdrawal from alcohol.  Will place patient on scheduled librium for ppx due to numerous admissions with etoh withdrawal and seizures. His blood cultures are negative x 2 days.  Leukocytosis has resolved. He feels better on current antibiotics and currently saturating 100% on room air.   His CRP is markedly elevated likely due to active infectious process.  I dont think he needs steroids. He has been evaluated by infectious disease specialist who has refined his antimicrobial therapy with change to zosyn and agrees that this is likely related to infection and may be due to klebsiella, anaerobes, strep and also  possible TB which is being investigated.  Patient remains negative pressure isolation.    12/19/23- patient seen at bedside, he's on room air. He coughs more phlegm during exam today. He was seen by GI no signs of bleeding a this time.  He is being followed by ID and it appears his infectious pneumonia is improving.  He reports good sleep no withdrawal symptoms and no episodes of aggitation.  Today is day #4 with no alcohol. Legionella ans step pneumoniae are back and results are negative.  TB workup still in process. Gram stain and cultrure still in process, autoimmune workup still in process.  CBC with h/h decrement noted. G6pd and haptoglobin serology sent in today.    12/20/23- patient with Klebsiella pneumonia per resp cultures.  He is on room air.  He is stable clinically and his sensitivity panel shows PO options available for treatment.  Will send message to team regarding outpatient treatment and follow up despite TB workup still pending.    12/21/23- patient cleared for dc home with outpatient follow up. Plan to treat with PO abx for next 3 weeks utilizing bactrim and augmentin.  His AFB was negative x 1 and he is instructed to wear mask post discharge until further review.  He is on room air feeling better.  He may need bronchoscopy in the future we discussed this  PAST MEDICAL HISTORY   Past Medical History:  Diagnosis Date  Alcohol abuse    HTN (hypertension)    Neuropathy    Pancreatitis    Peptic ulcer disease      SURGICAL HISTORY   Past Surgical History:  Procedure Laterality Date   ANTERIOR CERVICAL DECOMP/DISCECTOMY FUSION N/A 04/02/2022   Procedure: C3-6 ANTERIOR CERVICAL DECOMPRESSION/DISCECTOMY FUSION 3 LEVELS;  Surgeon: Venetia Night, MD;  Location: [facility];  Service: Neurosurgery;  Laterality: N/A;   APPENDECTOMY     INCISION AND DRAINAGE Left 11/11/2018   Procedure: INCISION AND DRAINAGE;  Surgeon: Donato Heinz, MD;  Location: [facility] OR[facility];  Service:  Orthopedics;  Laterality: Left;   LAPAROSCOPIC APPENDECTOMY N/A 07/06/2018   Procedure: APPENDECTOMY LAPAROSCOPIC;  Surgeon: Carolan Shiver, MD;  Location: [facility] OR[facility];  Service: General;  Laterality: N/A;     FAMILY HISTORY   Family History  Problem Relation Age of Onset   Cataracts Mother      SOCIAL HISTORY   Social History   Tobacco Use   Smoking status: Every Day    Current packs/day: 0.50    Types: Cigarettes   Smokeless tobacco: Never  Vaping Use   Vaping status: Never Used  Substance Use Topics   Alcohol use: Yes    Comment: daily 40 oz, reports 4 40oz/day   Drug use: No     MEDICATIONS    Home Medication:     Current Medication:  Current Facility-Administered Medications:    acetaminophen (TYLENOL) tablet 650 mg, 650 mg, Oral, Q6H PRN **OR** acetaminophen (TYLENOL) suppository 650 mg, 650 mg, Rectal, Q6H PRN, Andris Baumann, MD   albuterol (PROVENTIL) (2.5 MG/3ML) 0.083% nebulizer solution 2.5 mg, 2.5 mg, Inhalation, Q6H PRN, Andris Baumann, MD   chlordiazePOXIDE (LIBRIUM) capsule 10 mg, 10 mg, Oral, TID, Lurene Shadow, MD, 10 mg at 12/20/23 2119   feeding supplement (ENSURE ENLIVE / ENSURE PLUS) liquid 237 mL, 237 mL, Oral, BID BM, Lurene Shadow, MD, 237 mL at 12/20/23 1554   folic acid (FOLVITE) tablet 1 mg, 1 mg, Oral, Daily, Lindajo Royal V, MD, 1 mg at 12/20/23 0936   guaiFENesin-dextromethorphan (ROBITUSSIN DM) 100-10 MG/5ML syrup 10 mL, 10 mL, Oral, QID, Lurene Shadow, MD, 10 mL at 12/20/23 2119   multivitamin with minerals tablet 1 tablet, 1 tablet, Oral, Daily, Andris Baumann, MD, 1 tablet at 12/20/23 4782   nicotine (NICODERM CQ - dosed in mg/24 hours) patch 14 mg, 14 mg, Transdermal, Daily, Lindajo Royal V, MD   ondansetron (ZOFRAN) tablet 4 mg, 4 mg, Oral, Q6H PRN **OR** ondansetron (ZOFRAN) injection 4 mg, 4 mg, Intravenous, Q6H PRN, Andris Baumann, MD   pantoprazole (PROTONIX) EC tablet 40 mg, 40 mg, Oral, BID, Lurene Shadow, MD, 40  mg at 12/20/23 2119   piperacillin-tazobactam (ZOSYN) IVPB 3.375 g, 3.375 g, Intravenous, Q8H, Barrie Folk, RPH, Last Rate: 12.5 mL/hr at 12/21/23 0117, 3.375 g at 12/21/23 0117   thiamine (VITAMIN B1) tablet 100 mg, 100 mg, Oral, Daily, 100 mg at 12/20/23 0936 **OR** thiamine (VITAMIN B1) injection 100 mg, 100 mg, Intravenous, Daily, Andris Baumann, MD    ALLERGIES   Patient has no known allergies.     REVIEW OF SYSTEMS    Review of Systems:  Gen:  Denies  fever, sweats, chills weigh loss  HEENT: Denies blurred vision, double vision, ear pain, eye pain, hearing loss, nose bleeds, sore throat Cardiac:  No dizziness, chest pain or heaviness, chest tightness,edema Resp:   reports dyspnea chronically  Gi: Denies swallowing difficulty, stomach pain, nausea  or vomiting, diarrhea, constipation, bowel incontinence Gu:  Denies bladder incontinence, burning urine Ext:   Denies Joint pain, stiffness or swelling Skin: Denies  skin rash, easy bruising or bleeding or hives Endoc:  Denies polyuria, polydipsia , polyphagia or weight change Psych:   Denies depression, insomnia or hallucinations   Other:  All other systems negative   VS: BP 109/67 (BP Location: Right Arm)   Pulse 89   Temp 98.9 F (37.2 C) (Oral)   Resp 18   Ht 5\' 7"  (1.702 m)   Wt 64.6 kg   SpO2 100%   BMI 22.31 kg/m      PHYSICAL EXAM    GENERAL:NAD, no fevers, chills, no weakness no fatigue HEAD: Normocephalic, atraumatic.  EYES: Pupils equal, round, reactive to light. Extraocular muscles intact. No scleral icterus.  MOUTH: Moist mucosal membrane. Dentition intact. No abscess noted.  EAR, NOSE, THROAT: Clear without exudates. No external lesions.  NECK: Supple. No thyromegaly. No nodules. No JVD.  PULMONARY: decreased breath sounds with mild rhonchi worse at bases bilaterally.  CARDIOVASCULAR: S1 and S2. Regular rate and rhythm. No murmurs, rubs, or gallops. No edema. Pedal pulses 2+ bilaterally.   GASTROINTESTINAL: Soft, nontender, nondistended. No masses. Positive bowel sounds. No hepatosplenomegaly.  MUSCULOSKELETAL: No swelling, clubbing, or edema. Range of motion full in all extremities.  NEUROLOGIC: Cranial nerves II through XII are intact. No gross focal neurological deficits. Sensation intact. Reflexes intact.  SKIN: No ulceration, lesions, rashes, or cyanosis. Skin warm and dry. Turgor intact.  PSYCHIATRIC: Mood, affect within normal limits. The patient is awake, alert and oriented x 3. Insight, judgment intact.       IMAGING      Contains abnormal data C-reactive protein Order: [id number]  Status: Final result     Next appt: None   Test Result Released: No (inaccessible in MyChart)   0 Result Notes    Component Ref Range & Units (hover) 1 d ago  CRP 10.8      Result Notes    Component Ref Range & Units (hover) 1 d ago  Specimen Description SPU EXPECTORATED SPUTUM Performed at [facility], [address] [place], [place] [address]  Special Requests NONE Reflexed from [phone] Performed at [facility], [address] [place], [place] [address]  Gram Stain MODERATE WBC PRESENT,BOTH PMN AND MONONUCLEAR FEW GRAM POSITIVE COCCI IN PAIRS RARE GRAM NEGATIVE RODS Performed at [facility], [address], [place], [place] [address]  Culture PENDING      ASSESSMENT/PLAN   Recurrent pneumonia with cavitary lung process    - AFB pCR x 3    - patient known to have seizure disorder and likely has had multiple episodes of aspiration induced pneumonia.  He may benefit from bronchoscopy.     - Gram stain and cultures - sputum -Klebsiella with good sensitivity noted     - empiric antibiotics- infectious disease is on case - appreciate input-hopefully can go home on po meds    - procalcitionin    - MRSA pcr     - CRP trend is elevated     - ANA comprehensive is negative       Acute normocytic anemia  - s/p GI evaluation  -s/p GI  evaluation with good report no gastrointestinal abnormalties -he does not have hemoptysis on clinical examination -retic count, g6PD, haptoglobin sent in today    Thank you for allowing me to participate in the care of this patient.  Patient/Family are satisfied with care plan and all questions have been answered.    Provider disclosure: Patient with at least one acute or chronic illness or injury that poses a threat to life or bodily function and is being managed actively during this encounter.  All of the below services have been performed independently by signing provider:  review of prior documentation from internal and or external health records.  Review of previous and current lab results.  Interview and comprehensive assessment during patient visit today. Review of current and previous chest radiographs/CT scans. Discussion of management and test interpretation with health care team and patient/family.   This document was prepared using Dragon voice recognition software and may include unintentional dictation errors.     Vida Rigger, M.D.  Division of Pulmonary & Critical Care Medicine

## 2023-12-21 NOTE — Discharge Summary (Signed)
 Physician Discharge Summary   Patient: Lance Green MRN: [id number] DOB: Sep 27, 1976  Admit date:     12/16/2023  Discharge date: 12/21/23  Discharge Physician: Lurene Shadow   PCP: [facility]   Recommendations at discharge:  {Tip this will not be part of the note when signed- Example include specific recommendations for outpatient follow-up, pending tests to follow-up on. (Optional):26781} Follow-up with Dr. Rivka Safer, ID specialist on 12/31/2023 at 11:45 AM Follow-up with Dr. 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 *  Hospital Course:  PHIL MICHELS is a 48 y.o. male with medical history significant for hypertension, history of pancreatitis, history of peptic ulcer disease, tobacco use disorder, alcohol use disorder, history of alcohol withdrawal seizure in July 2024, who presented to the hospital with 1 month history of cough, shortness of breath, general weakness, night sweats and weight loss of about 10 pounds.   Diagnostic data significant for hemoglobin of 6.3 and CT chest concerning for right upper lobe cavitary necrotizing pneumonia.   Assessment and Plan:  Right upper lobe cavitary, necrotizing pneumonia: Improved.  He said "the medicines worked because I feel better".   Sputum culture showed Klebsiella pneumoniae.  ID recommended 3-week course of Bactrim and Augmentin. Patient was also advised to wear a mask at home until  until all three sputums are back  Strep pneumo urine antigen, Legionella urine antigen and MRSA screen were negative.  ANA comprehensive panel was negative. Fungitell and serum Aspergillus  antigen are pending Sputum for AFB and MTB RIF, QuantiFERON-TB gold are pending. CRP elevated, 10.8      Severe anemia, positive heme stools: Hemoglobin up from 7.3-8. S/p transfusion with 1 unit of PRBCs on 12/16/2023.  Hemoglobin was 6.3 on admission. Iron 39, saturation ratio 24, TIBC 165, ferritin 474, folate 12.3. Iron deficiency has been ruled out. No overt GI bleeding. Continue Protonix twice daily for 8 weeks.  Avoid NSAIDs. No plan for endoscopic workup in the inpatient setting.  This can be pursued in the outpatient setting. Outpatient follow-up with Dr. Timothy Lasso, gastroenterologist, was recommended.     Hypomagnesemia: Improved     Alcohol use disorder: Discontinue Librium. Advised to quit alcohol.     Tobacco use disorder: Advised to quit smoking cigarettes.  He does not want to continue nicotine patch.     History of hypertension: BP stable.  It appears patient was not taking amlodipine at home.     Comorbidities include history of peptic ulcer disease, history of pancreatitis, history of alcohol withdrawal seizure     His condition has improved.  Discharge plan was discussed in detail with patient and Inetta Fermo, fiance, at the bedside    {Tip this will not be part of the note when signed Body mass index is 22.31 kg/m. , ,  (Optional):26781}   Consultants: Pulmonologist, ID specialist Procedures performed: None Disposition: Home Diet recommendation:  Discharge Diet Orders (From admission, onward)     Start     Ordered   12/21/23 0000  Diet - low sodium heart healthy        12/21/23 1541  Cardiac diet DISCHARGE MEDICATION: Allergies as of 12/21/2023   No Known Allergies      Medication List     STOP taking these medications    albuterol 108 (90 Base) MCG/ACT inhaler Commonly known as: VENTOLIN HFA   amLODipine 2.5 MG tablet Commonly known as: NORVASC   folic acid 1 MG tablet Commonly known as: FOLVITE   gabapentin 100 MG  capsule Commonly known as: Neurontin   LORazepam 0.5 MG tablet Commonly known as: Ativan   multivitamin with minerals Tabs tablet   nicotine 14 mg/24hr patch Commonly known as: NICODERM CQ - dosed in mg/24 hours   thiamine 100 MG tablet Commonly known as: Vitamin B-1       TAKE these medications    amoxicillin-clavulanate 875-125 MG tablet Commonly known as: AUGMENTIN Take 1 tablet by mouth 2 (two) times daily for 21 days.   pantoprazole 40 MG tablet Commonly known as: PROTONIX Take 1 tablet (40 mg total) by mouth 2 (two) times daily.   sulfamethoxazole-trimethoprim 800-160 MG tablet Commonly known as: BACTRIM DS Take 1 tablet by mouth 2 (two) times daily for 21 days.        Follow-up Information     Vida Rigger, MD. Schedule an appointment as soon as possible for a visit in 1 week(s).   Specialty: Pulmonary Disease Why: Follow-up for weekly blood work (CBC and CMP) Contact information: [address] [place] [address] [phone]                Discharge Exam: Ceasar Mons Weights   12/17/23 2225  Weight: 64.6 kg   GEN: NAD SKIN: Warm and dry EYES: No pallor or icterus ENT: MMM CV: RRR PULM: CTA B ABD: soft, ND, NT, +BS CNS: AAO x 3, non focal EXT: No edema or tenderness   Condition at discharge: good  The results of significant diagnostics from this hospitalization (including imaging, microbiology, ancillary and laboratory) are listed below for reference.   Imaging Studies: CT Chest W Contrast Result Date: 12/17/2023 CLINICAL DATA:  Cough, fever, dyspnea EXAM: CT CHEST WITH CONTRAST TECHNIQUE: Multidetector CT imaging of the chest was performed during intravenous contrast administration. RADIATION DOSE REDUCTION: This exam was performed according to the departmental dose-optimization program which includes automated exposure control, adjustment of the mA and/or kV according to patient size and/or use of iterative reconstruction  technique. CONTRAST:  75mL OMNIPAQUE IOHEXOL 300 MG/ML  SOLN COMPARISON:  03/31/2021 FINDINGS: Cardiovascular: No significant vascular findings. Normal heart size. No pericardial effusion. Mediastinum/Nodes: Visualized thyroid is unremarkable. There is right hilar adenopathy with multiple lymph nodes measuring up to 12-13 mm in short axis diameter possibly reactive. The esophagus is unremarkable. Lungs/Pleura: There is extensive cavitation within the right upper lobe with a thick walled cavitary lesion replacing the posterior segment of the right upper lobe, crossing the major fissure, and extending into the superior segment of the right lower lobe. The violation of the fissure favors a infectious process in keeping with necrotizing pneumonia, though neoplasm is not completely excluded. Note that granulomatous infections could appear similarly, as can be seen with mycobacterial infection. Surrounding ground-glass inflammatory infiltrate is seen within the right upper lobe. Air-fluid levels noted within the cavitary lesion in keeping with acute inflammation. There is bronchial wall thickening present in keeping with airway inflammation. No pneumothorax or pleural effusion. Previously noted cavitary lesion within the left upper lobe has nearly resolved a small amount of residual parenchymal scarring. Upper Abdomen: No acute abnormality. Changes of chronic  pancreatitis are again identified. Musculoskeletal: No chest wall abnormality. No acute or significant osseous findings. IMPRESSION: 1. Extensive cavitation within the right upper lobe with a thick walled cavitary lesion replacing the posterior segment of the right upper lobe, crossing the major fissure, and extending into the superior segment of the right lower lobe. Favor necrotizing pneumonia, though neoplasm is not completely excluded. Note that granulomatous infections could appear similarly, as can be seen with mycobacterial infection, which should be excluded.  Follow-up imaging is recommended after appropriate therapy to document resolution. 2. Right hilar adenopathy, possibly reactive. 3. Near complete resolution of previously noted cavitary lesion within the left upper lobe with a small amount of residual parenchymal scarring. 4. Changes of chronic pancreatitis. Electronically Signed   By: Helyn Numbers M.D.   On: 12/17/2023 01:47   DG Chest Portable 1 View Result Date: 12/16/2023 CLINICAL DATA:  Cough cough, fever, shortness of breath. EXAM: PORTABLE CHEST 1 VIEW COMPARISON:  05/17/2023 FINDINGS: Opacity abutting the fissure. Question of cavitary component. The left lung is clear. Is normal in size. No significant pleural effusion. No pulmonary edema. IMPRESSION: Opacity abutting the fissure, suspicious for pneumonia. Question of cavitary component. Recommend CT for further assessment. Electronically Signed   By: Narda Rutherford M.D.   On: 12/16/2023 23:48    Microbiology: Results for orders placed or performed during the hospital encounter of 12/16/23  Culture, blood (Routine x 2)     Status: None   Collection Time: 12/16/23 10:17 PM   Specimen: BLOOD  Result Value Ref Range Status   Specimen Description BLOOD RIGHT WRIST  Final   Special Requests   Final    BOTTLES DRAWN AEROBIC AND ANAEROBIC Blood Culture results may not be optimal due to an inadequate volume of blood received in culture bottles   Culture   Final    NO GROWTH 5 DAYS Performed at [facility], [address] [place], [place] [address]    Report Status 12/21/2023 FINAL  Final  Culture, blood (Routine x 2)     Status: None   Collection Time: 12/16/23 10:17 PM   Specimen: BLOOD  Result Value Ref Range Status   Specimen Description BLOOD RIGHT HAND  Final   Special Requests   Final    BOTTLES DRAWN AEROBIC AND ANAEROBIC Blood Culture results may not be optimal due to an inadequate volume of blood received in culture bottles   Culture   Final    NO GROWTH 5  DAYS Performed at [facility], [address] [place], [place] [address]    Report Status 12/21/2023 FINAL  Final  Resp panel by RT-PCR (RSV, Flu A&B, Covid) Anterior Nasal Swab     Status: None   Collection Time: 12/16/23 10:19 PM   Specimen: Anterior Nasal Swab  Result Value Ref Range Status   SARS Coronavirus 2 by RT PCR NEGATIVE NEGATIVE Final    Comment: (NOTE) SARS-CoV-2 target nucleic acids are NOT DETECTED.  The SARS-CoV-2 RNA is generally detectable in upper respiratory specimens during the acute phase of infection. The lowest concentration of SARS-CoV-2 viral copies this assay can detect is 138 copies/mL. A negative result does not preclude SARS-Cov-2 infection and should not be used as the sole basis for treatment or other patient management decisions. A negative result may occur with  improper specimen collection/handling, submission of specimen other than nasopharyngeal swab, presence of viral mutation(s) within the areas targeted by this assay, and inadequate number of viral copies(<138 copies/mL). A  negative result must be combined with clinical observations, patient history, and epidemiological information. The expected result is Negative.  Fact Sheet for Patients:  BloggerCourse.com  Fact Sheet for Healthcare Providers:  SeriousBroker.it  This test is no t yet approved or cleared by the [place] FDA and  has been authorized for detection and/or diagnosis of SARS-CoV-2 by FDA under an Emergency Use Authorization (EUA). This EUA will remain  in effect (meaning this test can be used) for the duration of the COVID-19 declaration under Section 564(b)(1) of the Act, 21 U.S.C.section 360bbb-3(b)(1), unless the authorization is terminated  or revoked sooner.       Influenza A by PCR NEGATIVE NEGATIVE Final   Influenza B by PCR NEGATIVE NEGATIVE Final    Comment: (NOTE) The Xpert Xpress  SARS-CoV-2/FLU/RSV plus assay is intended as an aid in the diagnosis of influenza from Nasopharyngeal swab specimens and should not be used as a sole basis for treatment. Nasal washings and aspirates are unacceptable for Xpert Xpress SARS-CoV-2/FLU/RSV testing.  Fact Sheet for Patients: BloggerCourse.com  Fact Sheet for Healthcare Providers: SeriousBroker.it  This test is not yet approved or cleared by the [place] FDA and has been authorized for detection and/or diagnosis of SARS-CoV-2 by FDA under an Emergency Use Authorization (EUA). This EUA will remain in effect (meaning this test can be used) for the duration of the COVID-19 declaration under Section 564(b)(1) of the Act, 21 U.S.C. section 360bbb-3(b)(1), unless the authorization is terminated or revoked.     Resp Syncytial Virus by PCR NEGATIVE NEGATIVE Final    Comment: (NOTE) Fact Sheet for Patients: BloggerCourse.com  Fact Sheet for Healthcare Providers: SeriousBroker.it  This test is not yet approved or cleared by the [place] FDA and has been authorized for detection and/or diagnosis of SARS-CoV-2 by FDA under an Emergency Use Authorization (EUA). This EUA will remain in effect (meaning this test can be used) for the duration of the COVID-19 declaration under Section 564(b)(1) of the Act, 21 U.S.C. section 360bbb-3(b)(1), unless the authorization is terminated or revoked.  Performed at [facility], [address] [place], [place] [address]   MTB-RIF NAA with AFB Culture, sputum (q8 x 3)     Status: None (Preliminary result)   Collection Time: 12/17/23  4:28 AM   Specimen: Sputum  Result Value Ref Range Status   Myco tuberculosis Complex PENDING  Incomplete   Rifampin PENDING  Incomplete   AFB Specimen Processing Concentration  Final    Comment: (NOTE) Performed At: [facility] [address] [place], [place] [id number] Jolene Schimke MD TK:[id number]    Acid Fast Culture PENDING  Incomplete   Source (MTB RIF) SPUTUM  Final    Comment: Performed at [facility], [address] [place], [place] [address]  MTB-RIF NAA with AFB Culture, sputum (q8 x 3)     Status: None (Preliminary result)   Collection Time: 12/17/23  2:54 PM   Specimen: Sputum  Result Value Ref Range Status   Myco tuberculosis Complex PENDING  Incomplete   Rifampin PENDING  Incomplete   AFB Specimen Processing Concentration  Final    Comment: (NOTE) Performed At: [facility] [address] [place], [place] [id number] Jolene Schimke MD BJ:[id number]    Acid Fast Culture PENDING  Incomplete   Source (MTB RIF) SPUTUM  Final    Comment: Performed at [facility], [address] [place], [place] [address]  MTB-RIF NAA with AFB Culture, sputum (q8 x 3)  Status: None (Preliminary result)   Collection Time: 12/17/23  8:30 PM   Specimen: Sputum  Result Value Ref Range Status   Myco tuberculosis Complex PENDING  Incomplete   Rifampin PENDING  Incomplete   AFB Specimen Processing Concentration  Final    Comment: (NOTE) Performed At: [facility] [address] [place], [place] [id number] Jolene Schimke MD [id number]    Acid Fast Culture PENDING  Incomplete   Source (MTB RIF) SPU  Final    Comment: Performed at [facility], [address] [place], [place] [address]  MRSA Next Gen by PCR, Nasal     Status: None   Collection Time: 12/17/23  8:30 PM   Specimen: Sputum; Nasal Swab  Result Value Ref Range Status   MRSA by PCR Next Gen NOT DETECTED NOT DETECTED Final    Comment: (NOTE) The GeneXpert MRSA Assay (FDA approved for NASAL specimens only), is one component of a comprehensive MRSA colonization surveillance program. It is not intended to diagnose MRSA infection nor to guide or monitor treatment for MRSA infections. Test  performance is not FDA approved in patients less than 25 years old. Performed at [facility], [address] [place], [place] [address]   Expectorated Sputum Assessment w Gram Stain, Rflx to Resp Cult     Status: None   Collection Time: 12/17/23  8:30 PM   Specimen: Sputum  Result Value Ref Range Status   Specimen Description SPU EXPECTORATED SPUTUM  Final   Special Requests NONE  Final   Sputum evaluation   Final    THIS SPECIMEN IS ACCEPTABLE FOR SPUTUM CULTURE Performed at [facility], [address] [place], [place] [address]    Report Status 12/17/2023 FINAL  Final  Acid Fast Smear (AFB)     Status: None   Collection Time: 12/17/23  8:30 PM   Specimen: Sputum  Result Value Ref Range Status   AFB Specimen Processing Concentration  Final   Acid Fast Smear Negative  Final    Comment: (NOTE) Performed At: [facility] [address] [place], [place] [id number] Jolene Schimke MD [id number]    Source (AFB) SPUTUM  Final    Comment: Performed at [facility], [address] [place], [place] [address]  Culture, Respiratory w Gram Stain     Status: None   Collection Time: 12/17/23  8:30 PM   Specimen: Sputum  Result Value Ref Range Status   Specimen Description   Final    SPU EXPECTORATED SPUTUM Performed at [facility], [address] [place], [place] [address]    Special Requests   Final    NONE Reflexed from [phone] Performed at [facility], [address] [place], [place] [address]    Gram Stain   Final    MODERATE WBC PRESENT,BOTH PMN AND MONONUCLEAR FEW GRAM POSITIVE COCCI IN PAIRS RARE GRAM NEGATIVE RODS Performed at [facility], [address], [place], [place] [address]    Culture FEW KLEBSIELLA PNEUMONIAE  Final   Report Status 12/20/2023 FINAL  Final   Organism ID, Bacteria KLEBSIELLA PNEUMONIAE  Final      Susceptibility   Klebsiella pneumoniae - MIC*    AMPICILLIN RESISTANT  Resistant     CEFEPIME <=0.12 SENSITIVE Sensitive     CEFTAZIDIME <=1 SENSITIVE Sensitive     CEFTRIAXONE <=0.25 SENSITIVE Sensitive     CIPROFLOXACIN <=0.25 SENSITIVE Sensitive     GENTAMICIN <=1 SENSITIVE Sensitive  IMIPENEM <=0.25 SENSITIVE Sensitive     TRIMETH/SULFA <=20 SENSITIVE Sensitive     AMPICILLIN/SULBACTAM 4 SENSITIVE Sensitive     PIP/TAZO <=4 SENSITIVE Sensitive ug/mL    * FEW KLEBSIELLA PNEUMONIAE    Labs: CBC: Recent Labs  Lab 12/16/23 2216 12/17/23 1628 12/17/23 2002 12/18/23 0209 12/18/23 0828 12/18/23 1418 12/19/23 0246 12/21/23 0750  WBC 11.4*  --  11.2* 9.8  --   --  9.2 7.8  NEUTROABS 6.4  --   --  6.3  --   --   --   --   HGB 6.3*   < > 9.6* 8.2* 9.5* 8.4* 7.3* 8.0*  HCT 18.5*  --  27.8* 23.7*  --   --  21.6* 24.1*  MCV 90.2  --  87.1 85.6  --   --  88.9 89.3  PLT 260  --  298 287  --   --  275 297   < > = values in this interval not displayed.   Basic Metabolic Panel: Recent Labs  Lab 12/16/23 2216 12/18/23 0209 12/18/23 1641 12/19/23 0246 12/21/23 0750  NA 130* 134* 134*  --  139  K 3.8 3.7 3.7  --  3.8  CL 101 103 104  --  106  CO2 18* 23 22  --  27  GLUCOSE 89 126* 128*  --  135*  BUN 5* 8 8  --  9  CREATININE 0.70 0.73 0.80  --  0.88  CALCIUM 8.3* 8.4* 8.1*  --  8.4*  MG  --  1.2*  --  1.7 1.5*  PHOS  --  3.8  --  3.7  --    Liver Function Tests: Recent Labs  Lab 12/16/23 2216  AST 21  ALT 9  ALKPHOS 66  BILITOT 0.4  PROT 6.5  ALBUMIN 2.2*   CBG: No results for input(s): "GLUCAP" in the last 168 hours.  Discharge time spent: greater than 30 minutes.  Signed: Lurene Shadow, MD [facility] 12/21/2023

## 2023-12-21 NOTE — Plan of Care (Signed)
   Problem: Education: Goal: Knowledge of General Education information will improve Description: Including pain rating scale, medication(s)/side effects and non-pharmacologic comfort measures Outcome: Progressing   Problem: Clinical Measurements: Goal: Diagnostic test results will improve Outcome: Progressing   Problem: Safety: Goal: Ability to remain free from injury will improve Outcome: Progressing

## 2023-12-21 NOTE — Progress Notes (Signed)
 Date of Admission:  12/16/2023     ID: BRODAN GREWELL is a 48 y.o. 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   Tobacco use disorder   Alcohol use disorder   Anemia    Subjective: Feeling better Some cough  Medications:   chlordiazePOXIDE  10 mg Oral TID   folic acid  1 mg Oral Daily   guaiFENesin-dextromethorphan  10 mL Oral QID   multivitamin with minerals  1 tablet Oral Daily   pantoprazole  40 mg Oral BID   thiamine  100 mg Oral Daily   Or   thiamine  100 mg Intravenous Daily    Objective: Vital signs in last 24 hours: Patient Vitals for the past 24 hrs:  BP Temp Temp src Pulse Resp SpO2  12/21/23 0902 102/63 98.2 F (36.8 C) -- 86 18 100 %  12/21/23 0444 109/67 98.9 F (37.2 C) Oral 89 18 100 %  12/20/23 2056 121/80 98.4 F (36.9 C) -- 98 15 100 %  12/20/23 1530 111/71 98.4 F (36.9 C) Oral 89 18 100 %   Lab Results    Latest Ref Rng & Units 12/21/2023    7:50 AM 12/19/2023    2:46 AM 12/18/2023    2:18 PM  CBC  WBC 4.0 - 10.5 K/uL 7.8  9.2    Hemoglobin 13.0 - 17.0 g/dL 8.0  7.3  8.4   Hematocrit 39.0 - 52.0 % 24.1  21.6    Platelets 150 - 400 K/uL 297  275         Latest Ref Rng & Units 12/21/2023    7:50 AM 12/18/2023    4:41 PM 12/18/2023    2:09 AM  CMP  Glucose 70 - 99 mg/dL 161  096  045   BUN 6 - 20 mg/dL 9  8  8    Creatinine 0.61 - 1.24 mg/dL 4.09  8.11  9.14   Sodium 135 - 145 mmol/L 139  134  134   Potassium 3.5 - 5.1 mmol/L 3.8  3.7  3.7   Chloride 98 - 111 mmol/L 106  104  103   CO2 22 - 32 mmol/L 27  22  23    Calcium 8.9 - 10.3 mg/dL 8.4  8.1  8.4       Microbiology: 3/12 BC NG Sputum culture klebsiella AFB smear 1 negativr Studies/Results: No results found.    Assessment/Plan: 48 year old male with history of alcohol abuse, previous left lung abscess negative for AFB, ACDF C3-C6 presents with cough of more than a  months duration productive of sputum and shortness of breath of a few days duration worse in the recent 48 hours.  Nocturnal sweats and weakness   Cavitary pneumonia on the right upper lobe extending to the lower lobe There is a thick-walled cavity very likely related to alcohol use aspiration and necrotizing pneumonia leading out to a cavitary lesion.  The differential diagnosis would be tuberculosis especially with his alcohol use may need to rule out TB. Atypical mycobacteria  possible On airborne isolation 3 sputums for AFB sent Pt on zosyn- for klebsiella will DC on bactrim and augmentin BID  for 3 weeka 1 AFB smear  negative-will wear mask at home till all AFB results back Non infectious cause would be vasculitis  Anemia   Hyponatremia secondary to the lung lesion likely   History of  ACDF C3-C6 ? Discussed the management with hsopitalist Pt has been discharged- not seen today

## 2023-12-22 LAB — HAPTOGLOBIN: Haptoglobin: 325 mg/dL (ref 23–355)

## 2023-12-23 LAB — FUNGITELL BETA-D-GLUCAN
Fungitell Value:: 54.015 pg/mL
Result Name:: NEGATIVE

## 2023-12-23 LAB — GLUCOSE 6 PHOSPHATE DEHYDROGENASE
G6PDH: 9.7 U/g{Hb} (ref 3.8–14.2)
Hemoglobin: 7.4 g/dL — ABNORMAL LOW (ref 13.0–17.7)

## 2023-12-24 LAB — ASPERGILLUS ANTIGEN, BAL/SERUM: Aspergillus Ag, BAL/Serum: 5.66 {index} — ABNORMAL HIGH (ref 0.00–0.49)

## 2023-12-29 LAB — MISC LABCORP TEST (SEND OUT): Labcorp test code: 8618

## 2023-12-31 ENCOUNTER — Encounter: Payer: Self-pay | Admitting: Infectious Diseases

## 2023-12-31 ENCOUNTER — Ambulatory Visit: Attending: Infectious Diseases | Admitting: Infectious Diseases

## 2023-12-31 VITALS — BP 115/65 | HR 90 | Temp 98.0°F

## 2023-12-31 DIAGNOSIS — J984 Other disorders of lung: Secondary | ICD-10-CM | POA: Insufficient documentation

## 2023-12-31 DIAGNOSIS — J188 Other pneumonia, unspecified organism: Secondary | ICD-10-CM | POA: Diagnosis not present

## 2023-12-31 DIAGNOSIS — J150 Pneumonia due to Klebsiella pneumoniae: Secondary | ICD-10-CM | POA: Diagnosis present

## 2023-12-31 DIAGNOSIS — D649 Anemia, unspecified: Secondary | ICD-10-CM | POA: Insufficient documentation

## 2023-12-31 DIAGNOSIS — F17210 Nicotine dependence, cigarettes, uncomplicated: Secondary | ICD-10-CM | POA: Diagnosis not present

## 2023-12-31 DIAGNOSIS — J189 Pneumonia, unspecified organism: Secondary | ICD-10-CM

## 2023-12-31 NOTE — Patient Instructions (Signed)
 Lance Green, a 48 year old male, visited for a follow-up after being hospitalized for pneumonia and anemia. He is currently on antibiotics for pneumonia and is concerned about the necessity of his medications. He also has a history of acid reflux and prefers juice over water. He needs to follow up with a lung doctor for a repeat chest x-ray.  YOUR PLAN:  -PNEUMONIA: Pneumonia is an infection that inflames the air sacs in one or both lungs. You were hospitalized and are currently on a twice-daily antibiotic regimen. Bactrim and augmentin.  Your lung examination shows improvement, but you still have some residual symptoms. It is important to stay hydrated to prevent kidney issues from the antibiotics. Continue your current antibiotics and ensure you drink plenty of fluids. Schedule a follow-up with a lung doctor for a chest x-ray. Watch for side effects like diarrhea and consider taking probiotics if needed.  -ANEMIA: Anemia is a condition where you don't have enough healthy red blood cells to carry adequate oxygen to your body's tissues. You required a blood transfusion during your recent hospitalization because your hemoglobin was critically low. You need to follow up with a gastroenterologist to find out the underlying cause of your anemia.  -FOLLOW-UP FOR BLOOD TESTS: You need follow-up blood tests to monitor your kidney and liver function due to the antibiotics you are taking. These tests were not done today because of transportation issues. You should go to the medical mall for these tests, which are available Monday to Friday, 8 AM to 5 PM.  INSTRUCTIONS:  Please schedule a follow-up appointment with a pulmonologist for a chest x-ray. Also, arrange a follow-up with a gastroenterologist to investigate the cause of your anemia. Don't forget to visit the medical mall for your blood tests, available Monday to Friday, 8 AM to 5 PM.

## 2023-12-31 NOTE — Progress Notes (Signed)
 NAME: Lance Green  DOB: [birth year]  MRN: [id number]  Date/Time: 12/31/2023 12:11 PM   Subjective:   ?follow up visit after recent hospitalization for cavitary pneumonia- pt here with his partner- consented for the use of AI scribe   He was recently hospitalized for cavitary pneumonia  and sputum culture was positive for klebsiella  pneumonia . HE aslo had  anemia, requiring a blood transfusion due to a hemoglobin level of 6gm. He is currently on augmentin and bactrim for the cavitary pneumonia, taking one pill twice a day. He is unsure about the necessity of all his medications and seeks clarification on his purposes.  No current symptoms of diarrhea or loose stools, He is taking medication for acid reflux but is uncertain of its necessity, as he does not experience significant symptoms unless consuming certain foods like spaghetti.  Three sputums sent for [employer] have all come back negative He is feeling better . Less productive cough HE has not followed bu with pulmonary after discharge  He has a past h/o healed cavitary pneumonia on the left side   Past Medical History:  Diagnosis Date   Alcohol abuse    HTN (hypertension)    Neuropathy    Pancreatitis    Peptic ulcer disease     Past Surgical History:  Procedure Laterality Date   ANTERIOR CERVICAL DECOMP/DISCECTOMY FUSION N/A 04/02/2022   Procedure: C3-6 ANTERIOR CERVICAL DECOMPRESSION/DISCECTOMY FUSION 3 LEVELS;  Surgeon: Venetia Night, MD;  Location: [facility];  Service: Neurosurgery;  Laterality: N/A;   APPENDECTOMY     INCISION AND DRAINAGE Left 11/11/2018   Procedure: INCISION AND DRAINAGE;  Surgeon: Donato Heinz, MD;  Location: [facility];  Service: Orthopedics;  Laterality: Left;   LAPAROSCOPIC APPENDECTOMY N/A 07/06/2018   Procedure: APPENDECTOMY LAPAROSCOPIC;  Surgeon: Carolan Shiver, MD;  Location: [facility];  Service: General;  Laterality: N/A;    Social History   Socioeconomic History   Marital status:  Single    Spouse name: Not on file   Number of children: Not on file   Years of education: Not on file   Highest education level: Not on file  Occupational History   Not on file  Tobacco Use   Smoking status: Every Day    Current packs/day: 0.50    Types: Cigarettes   Smokeless tobacco: Never  Vaping Use   Vaping status: Never Used  Substance and Sexual Activity   Alcohol use: Yes    Comment: daily 40 oz, reports 4 40oz/day   Drug use: No   Sexual activity: Not on file  Other Topics Concern   Not on file  Social History Narrative   Not on file   Social Drivers of Health   Financial Resource Strain: Not on file  Food Insecurity: No Food Insecurity (12/18/2023)   Hunger Vital Sign    Worried About Running Out of Food in the Last Year: Never true    Ran Out of Food in the Last Year: Never true  Transportation Needs: No Transportation Needs (12/18/2023)   PRAPARE - Administrator, Civil Service (Medical): No    Lack of Transportation (Non-Medical): No  Physical Activity: Not on file  Stress: Not on file  Social Connections: Not on file  Intimate Partner Violence: Not At Risk (12/18/2023)   Humiliation, Afraid, Rape, and Kick questionnaire    Fear of Current or Ex-Partner: No    Emotionally Abused: No    Physically Abused: No    Sexually  Abused: No    Family History  Problem Relation Age of Onset   Cataracts Mother    No Known Allergies I? Current Outpatient Medications  Medication Sig Dispense Refill   amoxicillin-clavulanate (AUGMENTIN) 875-125 MG tablet Take 1 tablet by mouth 2 (two) times daily for 21 days. 42 tablet 0   pantoprazole (PROTONIX) 40 MG tablet Take 1 tablet (40 mg total) by mouth 2 (two) times daily. 60 tablet 1   sulfamethoxazole-trimethoprim (BACTRIM DS) 800-160 MG tablet Take 1 tablet by mouth 2 (two) times daily for 21 days. 42 tablet 0   No current facility-administered medications for this visit.     Abtx:  Anti-infectives (From  admission, onward)    None       REVIEW OF SYSTEMS:  Const: negative fever, negative chills, some weight loss Eyes: negative diplopia or visual changes, negative eye pain ENT: negative coryza, negative sore throat Resp:  cough, no hemoptysis, some dyspnea Cards: negative for chest pain, palpitations, lower extremity edema GU: negative for frequency, dysuria and hematuria GI: Negative for abdominal pain, diarrhea, bleeding, constipation Skin: negative for rash and pruritus Heme: negative for easy bruising and gum/nose bleeding MS: negative for myalgias, arthralgias, back pain and muscle weakness Neurolo:negative for headaches, dizziness, vertigo, memory problems  Psych: negative for feelings of anxiety, depression  Endocrine: negative for thyroid, diabetes Allergy/Immunology- negative for any medication or food allergies  Objective:  VITALS:  BP 115/65   Pulse 90   Temp 98 F (36.7 C) (Oral)   SpO2 97%    PHYSICAL EXAM:  General: Alert, cooperative, no distress, appears stated age.  Head: Normocephalic, without obvious abnormality, atraumatic. Eyes: Conjunctivae clear, anicteric sclerae. Pupils are equal ENT Nares normal. No drainage or sinus tenderness. Lips, mucosa, and tongue normal. No Thrush Neck: Supple, symmetrical, no adenopathy, thyroid: non tender no carotid bruit and no JVD. Back: No CVA tenderness. Lungs:b/l air entry- crepts right side. Heart: Regular rate and rhythm, no murmur, rub or gallop. Abdomen: Soft, non-tender,not distended. Bowel sounds normal. No masses Extremities: atraumatic, no cyanosis. No edema. No clubbing Skin: No rashes or lesions. Or bruising Lymph: Cervical, supraclavicular normal. Neurologic: Grossly non-focal Pertinent Labs Lab Results CBC    Component Value Date/Time   WBC 7.8 12/21/2023 0750   RBC 2.70 (L) 12/21/2023 0750   HGB 8.0 (L) 12/21/2023 0750   HGB 7.4 (L) 12/20/2023 0531   HCT 24.1 (L) 12/21/2023 0750   HCT 40.4  01/18/2014 1606   PLT 297 12/21/2023 0750   PLT 122 (L) 01/18/2014 1606   MCV 89.3 12/21/2023 0750   MCV 93 01/18/2014 1606   MCH 29.6 12/21/2023 0750   MCHC 33.2 12/21/2023 0750   RDW 14.2 12/21/2023 0750   RDW 13.9 01/18/2014 1606   LYMPHSABS 2.6 12/18/2023 0209   MONOABS 0.7 12/18/2023 0209   EOSABS 0.0 12/18/2023 0209   BASOSABS 0.0 12/18/2023 0209       Latest Ref Rng & Units 12/21/2023    7:50 AM 12/18/2023    4:41 PM 12/18/2023    2:09 AM  CMP  Glucose 70 - 99 mg/dL 478  295  621   BUN 6 - 20 mg/dL 9  8  8    Creatinine 0.61 - 1.24 mg/dL 3.08  6.57  8.46   Sodium 135 - 145 mmol/L 139  134  134   Potassium 3.5 - 5.1 mmol/L 3.8  3.7  3.7   Chloride 98 - 111 mmol/L 106  104  103  CO2 22 - 32 mmol/L 27  22  23    Calcium 8.9 - 10.3 mg/dL 8.4  8.1  8.4       Microbiology: No results found for this or any previous visit (from the past 240 hours).  IMAGING RESULTS: I have personally reviewed the films ? Impression/Recommendation Cavitary Pneumonia   He was recently hospitalized for this and required IV antibiotic treatment. He is currently on a twice-daily antibiotic regimen of bactrim and augmentin for a total of 3 weeks . Sputum test is negative for TB. Lung examination shows improvement with some residual symptoms. Hydration is emphasized to prevent potential nephrotoxicity from antibiotics. Continue the current antibiotic regimen and ensure adequate hydration. Schedule a follow-up with a pulmonologist for a chest x-ray.   Anemia   He experienced anemia requiring a blood transfusion during the recent hospitalization. Hemoglobin was critically low at 6 g/dL. A follow-up with gastroenterology is planned to investigate underlying causes.  Follow-up for blood tests   Follow-up blood tests are needed to monitor renal and hepatic function due to antibiotic use. Tests were not completed today due to transportation constraints. [facility]. Order blood tests to monitor renal, hepatic function, and complete blood count. Advise him to return for blood tests [facility], Monday to Friday, 8 AM to 5 PM. ?  ________________________________________________

## 2024-01-07 ENCOUNTER — Ambulatory Visit: Admitting: Family Medicine

## 2024-01-07 DIAGNOSIS — Z0000 Encounter for general adult medical examination without abnormal findings: Secondary | ICD-10-CM

## 2024-01-28 LAB — MTB-RIF NAA WITH AFB CULTURE, SPUTUM
Acid Fast Culture: NEGATIVE
Acid Fast Culture: NEGATIVE
Myco tuberculosis Complex: NOT DETECTED
Myco tuberculosis Complex: NOT DETECTED

## 2024-01-31 LAB — MTB-RIF NAA WITH AFB CULTURE, SPUTUM
Acid Fast Culture: NEGATIVE
Myco tuberculosis Complex: NOT DETECTED

## 2024-02-01 LAB — ACID FAST CULTURE WITH REFLEXED SENSITIVITIES (MYCOBACTERIA): Acid Fast Culture: NEGATIVE

## 2024-02-03 ENCOUNTER — Ambulatory Visit: Admitting: Family Medicine

## 2024-04-01 NOTE — Progress Notes (Signed)
 CC: Preventative Health Exam  HPI Verbally consented to the use of AI for note-taking.   Lance Green is a 48 y.o. here for preventative health exam  Preventative health exam: No acute issues.  Neuropathy is still uncontrolled.  He is down to drinking 80 ounces of beer a day which is decreased from his previous amount.  Blood pressure is well-controlled today.  Currently not taking any medications. does not exercises regularly and not following any type of diet.  No exertional cp or syncopal episodes.  No urinary issues or rectal pain/bleeding.  Endorses using tobacco.    History of Present Illness Lance Green is a 48 year old male who presents to establish care with a primary care provider.  He has a history of anemia and neuropathy, with a recent hospitalization in March for pneumonia, anemia, and flu-like symptoms. He feels generally unwell, experiencing tiredness and difficulty swallowing since his spine surgery last year. He recalls having a blood transfusion a few months ago and mentions past episodes of blood in his stools, though he is unsure if he has had a colonoscopy.  He was advised to follow-up with pulmonology after his hospitalization but said that he never went.  Did follow-up with an ID doctor.  He does have a cough but attributes it to his tobacco use.  He is not currently taking any medications regularly due to affordability issues but is supposed to take B12 and medication for ulcers. He experiences neuropathy, with numbness in his fingers and feet, and difficulty holding objects. Gabapentin  was previously prescribed but was ineffective, possibly due to low dosing. He also experiences unsteadiness and weakness, which he attributes to his neuropathy.  He smokes about half a pack of cigarettes a day and drinks approximately 80 ounces of beer daily. He has a history of ulcers since age 44 and experiences difficulty swallowing, which he associates with his spine surgery. He also  reports postnasal drainage that worsens when lying on his back, causing him to choke in his sleep.  He has a family history of cancer, including breast cancer in his mother, throat cancer in two aunts, and cancer in his grandmother. He reports abnormal weight loss and coughing, which he attributes to smoking.  He has not had a primary care provider before and has not followed up with specialists after his recent hospitalizations. He reports difficulty with vision, requiring glasses for close-up tasks, and has not had a recent eye exam due to insurance issues. He also has severe eczema, which he manages with creams and lotions.  No active flares.   ROS Review of systems is unremarkable for any active cardiac, respiratory, GI, GU, hematologic, neurologic, dermatologic, HEENT, or psychiatric symptoms except as noted above.  No fevers, chills, or constitutional symptoms.   No current outpatient medications on file.   No current facility-administered medications for this visit.    Allergies as of 04/01/2024  . (Not on File)    Patient Active Problem List  Diagnosis  . Alcohol use disorder  . Dysphagia  . Essential hypertension  . Hepatic steatosis  . Neuropathy  . Tobacco use disorder  . Normocytic anemia  . Cervical cord compression with myelopathy (CMS/HHS-HCC)  . Non-seasonal allergic rhinitis    No past medical history on file.  Past Surgical History:  Procedure Laterality Date  . C3-6 anterior cervical discectomy and fusion  04/02/2022   Dr Clois at [facility], [place]    No family history on file.  Social History  Socioeconomic History  . Marital status: Single  Tobacco Use  . Smoking status: Every Day    Types: Cigarettes  . Smokeless tobacco: Never  Substance and Sexual Activity  . Drug use: Never   Social Drivers of Corporate investment banker Strain: Medium Risk (04/01/2024)   Overall Financial Resource Strain (CARDIA)   . Difficulty of Paying Living  Expenses: Somewhat hard  Food Insecurity: Food Insecurity Present (04/01/2024)   Hunger Vital Sign   . Worried About Programme researcher, broadcasting/film/video in the Last Year: Sometimes true   . Ran Out of Food in the Last Year: Sometimes true  Transportation Needs: No Transportation Needs (04/01/2024)   PRAPARE - Transportation   . Lack of Transportation (Medical): No   . Lack of Transportation (Non-Medical): No  Housing Stability: High Risk (04/01/2024)   Housing Stability Vital Sign   . Unable to Pay for Housing in the Last Year: Yes   . Number of Times Moved in the Last Year: 2   . Homeless in the Last Year: Yes    Health Maintenance  Topic Date Due  . Potassium Level  Never done  . Lipid Panel  Never done  . Serum Calcium  Never done  . Colorectal Cancer Screening  Never done  . Annual Physical/Well Child Check  Never done  . Pneumococcal Vaccine (1 of 2 - PCV) Never done  . Influenza Vaccine (Season Ended) 06/06/2024  . Depression Screening  04/01/2025  . Adult Tetanus (Td And Tdap)  11/10/2028  . PSA  03/07/2030  . HIV Screen  Completed  . Hepatitis C Screen  Completed  . Hib Vaccines  Aged Out  . Hepatitis A Vaccines  Aged Out  . Meningococcal B Vaccine  Aged Out  . Meningococcal ACWY Vaccine  Aged Out  . HPV Vaccines  Aged Out  . COVID-19 Vaccine  Discontinued  . Varicella Vaccines  Discontinued    Health habits: Dental Exam:12/2023 Eye Exam:01/2024 Skin exam: Never Exercise: 0 times/week on average  Current exercise activities: no regular exercise  Diet: Not watching diet at all. Wears seat belt: Yes  PHQ 2/9 from today's flowsheet  PHQ-2 PHQ-2 Over the last 2 weeks, how often have you been bothered by any of the following problems? Little interest or pleasure in doing things: Not at all Feeling down, depressed, or hopeless: Several days Patient Health Questionnaire-2 Score: 1  PHQ-9 (if PHQ >=3)    Depression Severity and Treatment Recommendations:  0-4= None  5-9= Mild  / Treatment: Support, educate to call if worse; return in one month  10-14= Moderate / Treatment: Support, watchful waiting; Antidepressant or Psychotherapy  15-19= Moderately severe / Treatment: Antidepressant OR Psychotherapy  >= 20 = Major depression, severe / Antidepressant AND Psychotherapy  A total of 15 minutes was spent completing the PHQ questionnaire and counseling patient.  Vitals:   04/01/24 0910  BP: 128/82  Pulse: 74  SpO2: 98%  Weight: 59 kg (130 lb)  Height: 170.2 cm (5' 7)  PainSc: 0-No pain   Body mass index is 20.36 kg/m.  Exam  General.  Skinny appearing male; NAD; VS reviewed     Eyes. Sclera and conjunctiva clear; Vision grossly intact; extraocular movements intact Neck. Supple. No swelling, masses, thyroid  normal size, no masses palpated.  Lungs. Respirations unlabored; clear to auscultation bilaterally Back. No spinal deformity Cardiovascular. Heart regular rate and rhythm without murmurs, gallops, or rubs Abdomen. Soft; non tender; non distended; normoactive bowel sounds; no  organomegaly; small umbilical hernia noted Lymph Nodes. No significant cervical or supraclavicular lymphadenopathy noted Musculoskeletal. No deformities; no active joint inflammation Extremities. no edema Skin. Normal color and turgor Pulses. Dorsalis pedis palpable and symmetric bilaterally Neurologic. Alert and oriented x3; CN 2-12 grossly intact; no focal deficits, unsteady gait  Assessment and Plan  Preventative health exam - Stable exam.   - CV screening ordered today - PSA not indicated yet.  Will get at 15 - Not up to date on colonoscopy.   - Vaccinations reviewed.   - Counseled on nutrition modification and exercise - Counseled on tobacco cessation.  Will think about starting Chantix and follow-up at next appointment about it.  Assessment & Plan Anemia Anemia with possible gastrointestinal bleeding. Smoking and alcohol use are risk factors for colorectal cancer.  Referral to gastroenterology warranted. - Order CBC and CMP. - Refer to gastroenterology for evaluation of gastrointestinal bleeding and possible colonoscopy and endoscopy.  Dysphagia Difficulty swallowing possibly related to spine surgery. Postnasal drainage suggests sinus involvement. Discussed starting allergy medication. - Recommend daily allergy medication for what sounds like sx of post nasal drip  Peripheral Neuropathy Neuropathy possibly related to alcohol use or diabetes. Previous gabapentin  treatment ineffective. Discussed alcohol's role in neuropathy and retrying gabapentin  at higher dose if labs indicate. - Order hemoglobin A1c. - Order vitamin B1, B12, and folate levels. - Consider retrying gabapentin  at higher dose if labs indicate. - Consider referral to neurology  Alcohol Use Disorder Daily alcohol consumption may contribute to neuropathy and gastrointestinal issues. Discussed health impact and importance of reducing intake. - CBC, CMP, folate, thiamine , B12 ordered - Protonix  40 mg to help prevent ulcers  Gastroesophageal Reflux Disease (GERD) Ulcers and possible acid reflux. Alcohol use may exacerbate condition. Discussed Protonix  for ulcer prevention and acid reflux management. - Prescribe Protonix  40mg   Hypertension Possible history of hypertension, currently well-controlled. No medication adherence due to affordability. - Will follow-up in 1 month to recheck and make sure control  Tobacco Use Disorder Current smoker with unsuccessful quit attempts. Discussed Chantix for cessation, prefers to consider options at follow-up. - Will discuss chantix  at next appointment  General Health Maintenance No recent colonoscopy despite gastrointestinal bleeding. Discussed importance of colorectal cancer screening due to family history and risk factors. - Encourage colonoscopy for colorectal cancer screening. - Discussed COVID-19 booster and chickenpox vaccination, he  declined. - Advise to schedule appointment for eczema management if flare-ups occur. - CBC, CMP, hemoglobin A1c, lipids ordered  Follow-up Plan to follow up in one to two months to review lab results and reassess health conditions. - Schedule follow-up appointment in one to two months.    Goals Addressed             This Visit's Progress   . Maintain health/healthy lifestyle         Follow up: Follow-up in 1 months  This note has been created using automated tools and reviewed for accuracy by provider.  Attestation Statement:   I personally performed the service, non-incident to. (WP)   MASON MCCLELLAND MINOR, PA

## 2024-06-18 ENCOUNTER — Other Ambulatory Visit: Payer: Self-pay

## 2024-06-18 ENCOUNTER — Emergency Department
Admission: EM | Admit: 2024-06-18 | Discharge: 2024-06-19 | Disposition: A | Discharge status: Home / Self Care | Payer: MEDICAID | Attending: Emergency Medicine | Admitting: Emergency Medicine

## 2024-06-18 ENCOUNTER — Encounter: Payer: Self-pay | Admitting: Emergency Medicine

## 2024-06-18 ENCOUNTER — Emergency Department: Payer: MEDICAID

## 2024-06-18 DIAGNOSIS — F17200 Nicotine dependence, unspecified, uncomplicated: Secondary | ICD-10-CM | POA: Diagnosis not present

## 2024-06-18 DIAGNOSIS — J441 Chronic obstructive pulmonary disease with (acute) exacerbation: Secondary | ICD-10-CM | POA: Diagnosis not present

## 2024-06-18 DIAGNOSIS — D508 Other iron deficiency anemias: Secondary | ICD-10-CM | POA: Diagnosis not present

## 2024-06-18 DIAGNOSIS — R059 Cough, unspecified: Secondary | ICD-10-CM | POA: Diagnosis present

## 2024-06-18 DIAGNOSIS — I10 Essential (primary) hypertension: Secondary | ICD-10-CM | POA: Diagnosis not present

## 2024-06-18 DIAGNOSIS — R1310 Dysphagia, unspecified: Secondary | ICD-10-CM

## 2024-06-18 LAB — CBC WITH DIFFERENTIAL/PLATELET
Abs Immature Granulocytes: 0.02 K/uL (ref 0.00–0.07)
Basophils Absolute: 0.1 K/uL (ref 0.0–0.1)
Basophils Relative: 1 %
Eosinophils Absolute: 0.1 K/uL (ref 0.0–0.5)
Eosinophils Relative: 2 %
HCT: 31.7 % — ABNORMAL LOW (ref 39.0–52.0)
Hemoglobin: 10.5 g/dL — ABNORMAL LOW (ref 13.0–17.0)
Immature Granulocytes: 0 %
Lymphocytes Relative: 52 %
Lymphs Abs: 3.8 K/uL (ref 0.7–4.0)
MCH: 33.2 pg (ref 26.0–34.0)
MCHC: 33.1 g/dL (ref 30.0–36.0)
MCV: 100.3 fL — ABNORMAL HIGH (ref 80.0–100.0)
Monocytes Absolute: 0.4 K/uL (ref 0.1–1.0)
Monocytes Relative: 5 %
Neutro Abs: 3 K/uL (ref 1.7–7.7)
Neutrophils Relative %: 40 %
Platelets: 243 K/uL (ref 150–400)
RBC: 3.16 MIL/uL — ABNORMAL LOW (ref 4.22–5.81)
RDW: 12.6 % (ref 11.5–15.5)
WBC: 7.4 K/uL (ref 4.0–10.5)
nRBC: 0 % (ref 0.0–0.2)

## 2024-06-18 LAB — COMPREHENSIVE METABOLIC PANEL WITH GFR
ALT: 15 U/L (ref 0–44)
AST: 40 U/L (ref 15–41)
Albumin: 3.6 g/dL (ref 3.5–5.0)
Alkaline Phosphatase: 74 U/L (ref 38–126)
Anion gap: 14 (ref 5–15)
BUN: 7 mg/dL (ref 6–20)
CO2: 19 mmol/L — ABNORMAL LOW (ref 22–32)
Calcium: 8.9 mg/dL (ref 8.9–10.3)
Chloride: 107 mmol/L (ref 98–111)
Creatinine, Ser: 0.83 mg/dL (ref 0.61–1.24)
GFR, Estimated: >60 mL/min (ref >60)
Glucose, Bld: 84 mg/dL (ref 70–99)
Potassium: 3.9 mmol/L (ref 3.5–5.1)
Sodium: 140 mmol/L (ref 135–145)
Total Bilirubin: 0.6 mg/dL (ref 0.0–1.2)
Total Protein: 7.6 g/dL (ref 6.5–8.1)

## 2024-06-18 MED ORDER — ALBUTEROL SULFATE HFA 108 (90 BASE) MCG/ACT IN AERS: 2.0000 | INHALATION_SPRAY | Freq: Four times a day (QID) | RESPIRATORY_TRACT | 2 refills | Status: AC | PRN

## 2024-06-18 MED ORDER — FERROUS SULFATE 325 (65 FE) MG PO TBEC: 325.0000 mg | DELAYED_RELEASE_TABLET | Freq: Two times a day (BID) | ORAL | 0 refills | Status: AC

## 2024-06-18 MED ORDER — IPRATROPIUM-ALBUTEROL 0.5-2.5 (3) MG/3ML IN SOLN
3.0000 mL | Freq: Once | RESPIRATORY_TRACT | Status: AC
  Administered 2024-06-18: 3 mL via RESPIRATORY_TRACT
  Filled 2024-06-18: qty 3

## 2024-06-18 MED ORDER — VITAMIN C 250 MG PO TABS: 250.0000 mg | ORAL_TABLET | Freq: Every day | ORAL | 0 refills | Status: AC

## 2024-06-18 NOTE — ED Notes (Signed)
 First Nurse Note: BIB [facility] from home for throat issues. Pt had some sort of spine surgery previously and states I feel like something is loose in my throat from surgery. Pt is caox4, in no acute distress. Pt has been seen here and by PCP multiple times for same. Pt has ETOH on board.  122/77 98% RA 82 HR  92 BGL

## 2024-06-18 NOTE — ED Triage Notes (Signed)
 Patient c/o cough, nasal drainage and congestion, and sore throat x 3-4 months.  Patient reports he had spinal surgery last year and thinks it may be coming from this.

## 2024-06-18 NOTE — ED Provider Notes (Signed)
 [facility] Provider Note    Event Date/Time   First MD Initiated Contact with Patient 06/18/24 2109     (approximate)   History   Sore Throat (/), Cough, and Nasal Congestion    HPI  Lance Green is a 48 y.o. male    with a past medical history of anemia, alcohol intoxication, rib pain, who presents to the ED complaining of sore throat. According to the patient, symptoms started 3 months ago with odynophagia, nasal congestion, postnasal drip, dysphagia.  Patient endorses losing 40 pounds in the last 6 months.  Patient has an appointment with gastroenterology on September 25.  Patient denies fever, chills, nauseous, vomit, diarrhea.     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 12/17/2023   Tobacco use disorder 12/17/2023   Alcohol use disorder 12/17/2023   Neuropathy 04/25/2023   Hepatic steatosis 04/25/2023   Seizure due to alcohol withdrawal (HCC) 04/24/2023   Thrombocytopenia (HCC) 04/24/2023   ETOH abuse 04/23/2023   Transaminitis 04/23/2023   Tobacco abuse counseling 04/23/2023   Dysphagia 04/05/2022   Hypokalemia 04/01/2022   Rhabdomyolysis 04/01/2022   Fall at home, initial encounter 04/01/2022   Cervical cord compression with myelopathy (HCC) 03/28/2022   Cavitary pneumonia 02/20/2021   Sepsis (HCC) 02/20/2021   Normocytic anemia 02/20/2021   Essential hypertension    Alcohol abuse 11/21/2019   Hypomagnesemia 11/21/2019   Abscess of left middle finger 11/10/2018   Acute appendicitis with localized peritonitis 07/06/2018     ROS: Patient currently denies any vision changes, tinnitus, difficulty speaking, facial droop, sore throat, chest pain, shortness of breath, abdominal pain, nausea/vomiting/diarrhea, dysuria, or weakness/numbness/paresthesias in any extremity   Physical Exam   Triage Vital Signs: ED Triage Vitals  Encounter Vitals Group      BP 06/18/24 2025 102/71     Girls Systolic BP Percentile --      Girls Diastolic BP Percentile --      Boys Systolic BP Percentile --      Boys Diastolic BP Percentile --      Pulse Rate 06/18/24 2025 80     Resp 06/18/24 2025 18     Temp 06/18/24 2025 98.1 F (36.7 C)     Temp src --      SpO2 06/18/24 2025 97 %     Weight 06/18/24 2026 143 lb 4.8 oz (65 kg)     Height --      Head Circumference --      Peak Flow --      Pain Score 06/18/24 2026 2     Pain Loc --      Pain Education --      Exclude from Growth Chart --     Most recent vital signs: Vitals:   06/18/24 2025  BP: 102/71  Pulse: 80  Resp: 18  Temp: 98.1 F (36.7 C)  SpO2: 97%     Physical Exam Vitals and nursing note reviewed.  During triage vital signs were normal, patient was afebrile  Constitutional:      General: Awake and alert. No acute distress.    Appearance: Normal appearance. The patient is normal weight.      Able to speak in complete sentences without cough or dyspnea  HENT:     Head: Normocephalic and atraumatic.     Mouth: Mucous membranes are moist.  Peritonsillar erythema Eyes:  General: PERRL. Normal EOMs          Conjunctiva/sclera: Conjunctivae normal.  Nose No congestion/rhinorrhea  CV:                  Good peripheral perfusion.  Regular rate and rhythm  Resp:               Normal effort.  Equal breath sounds bilaterally.  Mobilization of secretions, wheezing Abd:                 No distention.  Soft, nontender.  No rebound or guarding.  Musculoskeletal:        General: No swelling. Normal range of motion.  Skin:    General: Skin is warm and dry.     Capillary Refill: Capillary refill takes less than 2 seconds.     Findings: No rash.  Neurological:     Mental Status: The patient is awake and alert. MAE spontaneously. No gross focal neurologic deficits are appreciated. Psychiatric Mood and affect are normal. Speech and behavior are normal.  ED Results / Procedures /  Treatments   Labs (all labs ordered are listed, but only abnormal results are displayed) Labs Reviewed  COMPREHENSIVE METABOLIC PANEL WITH GFR - Abnormal; Notable for the following components:      Result Value   CO2 19 (*)    All other components within normal limits  CBC WITH DIFFERENTIAL/PLATELET - Abnormal; Notable for the following components:   RBC 3.16 (*)    Hemoglobin 10.5 (*)    HCT 31.7 (*)    MCV 100.3 (*)    All other components within normal limits        RADIOLOGY I independently reviewed and interpreted imaging and agree with radiologists findings.      PROCEDURES:  Critical Care performed:   Procedures   MEDICATIONS ORDERED IN ED: Medications  ipratropium-albuterol  (DUONEB) 0.5-2.5 (3) MG/3ML nebulizer solution 3 mL (3 mLs Nebulization Given 06/18/24 2151)   Clinical Course as of 06/18/24 2301  Sat Jun 18, 2024  2238 Comprehensive metabolic panel(!) Electrolytes, renal function, liver function within normal limits [AE]  2239 CBC with Differential(!) Anemia, hemoglobin 10.5.  White blood cells within normal limits [AE]  2240 DG Chest Port 1 View No acute abnormality noted.  Persistent cavitary area in the right upper lobe all below the thickened wall and fluid within has resolved.   [AE]  2252 Reassessed the patient after having DuoNeb treatment.  On a physical exam resolution of the wheezing.  Patient is eating.  I did update the patient about results of blood work.  Patient is going to be discharged with albuterol  inhaler, Sulfate ferrous.  Patient is agreeable with the plan [AE]    Clinical Course User Index [AE] Janit Kast, PA-C    IMPRESSION / MDM / ASSESSMENT AND PLAN / ED COURSE  I reviewed the triage vital signs and the nursing notes.  Differential diagnosis includes, but is not limited to, pneumonia, bronchitis, GERD, anemia, OPD exacerbation  Patient's presentation is most consistent with acute complicated illness / injury  requiring diagnostic workup.  Lance Green is a 48 y.o., male who presents today with history of difficulty breathing, odynophagia, productive cough.  On physical exam there was bilateral expiratory wheezing, realization of secretions.  Rest of physical exam is normal Plan DuoNeb treatment CBC CMP Chest x-ray Reassess Reassessed the patient after DuoNeb treatment with resolution of bilateral wheezing. Patient's diagnosis is consistent with anemia, COPD exacerbation  odynophagia. I independently reviewed and interpreted imaging and agree with radiologists findings ruling out pneumonia. Labs are  reassuring. I did review the patient's allergies and medications.The patient is in stable and satisfactory condition for discharge home  Patient will be discharged home with prescriptions for albuterol  inhaler, sulfate ferrous, vitamin C . Patient is to follow up with PCP, gastroenterology as needed or otherwise directed. Patient is given ED precautions to return to the ED for any worsening or new symptoms.  Patient is requesting referral to rehab for alcoholism. Discussed plan of care with patient, answered all of patient's questions, Patient agreeable to plan of care. Advised patient to take medications according to the instructions on the label. Discussed possible side effects of new medications. Patient verbalized understanding.   FINAL CLINICAL IMPRESSION(S) / ED DIAGNOSES   Final diagnoses:  COPD exacerbation (HCC)  Other iron deficiency anemia     Rx / DC Orders   ED Discharge Orders          Ordered    albuterol  (VENTOLIN  HFA) 108 (90 Base) MCG/ACT inhaler  Every 6 hours PRN        06/18/24 2259    ferrous sulfate  325 (65 FE) MG EC tablet  2 times daily        06/18/24 2259    vitamin C  (ASCORBIC ACID ) 250 MG tablet  Daily        06/18/24 2259             Note:  This document was prepared using Dragon voice recognition software and may include unintentional dictation errors.    Janit Kast, PA-C 06/18/24 7698    Floy Roberts, MD 06/18/24 [phone]

## 2024-06-18 NOTE — ED Notes (Signed)
 Attempted to call aunt x2 with no answer.

## 2024-06-18 NOTE — ED Notes (Signed)
 Pt brief changed. Shorts, along with other clothes that were already taken off previously, were placed in a pt belonging bag. Pt given crackers and ginger ale per request. Pt had a short cough episode, so I informed him to press his call bell if he starts coughing too badly. No further requests from pt at this time.

## 2024-06-18 NOTE — Discharge Instructions (Addendum)
 You have been diagnosed with COPD exacerbation, iron deficiency anemia.  Please take ferrous sulfate  1 tablet by mouth after breakfast and dinner.  Please take vitamin C  1 tablet with breakfast.  Please inhale albuterol  2 puffs every 6 hours as needed for wheezing or shortness of breath.  Please go to your appointment with gastroenterology.  Please come back to ED or go to your PCP if you have new symptoms or symptoms worsen.

## 2024-06-19 NOTE — ED Notes (Signed)
 Called pt's mother to ask whom can pick him up since he is ready to be D/C.  Pt's mother states she has no one who can come and get him and that she does not drive.  She states  he is going to have to figure it out.

## 2024-07-15 ENCOUNTER — Other Ambulatory Visit: Payer: Self-pay

## 2024-07-15 DIAGNOSIS — R296 Repeated falls: Secondary | ICD-10-CM | POA: Diagnosis not present

## 2024-07-15 DIAGNOSIS — K219 Gastro-esophageal reflux disease without esophagitis: Secondary | ICD-10-CM | POA: Insufficient documentation

## 2024-07-15 DIAGNOSIS — E876 Hypokalemia: Secondary | ICD-10-CM | POA: Insufficient documentation

## 2024-07-15 DIAGNOSIS — G621 Alcoholic polyneuropathy: Secondary | ICD-10-CM | POA: Diagnosis not present

## 2024-07-15 DIAGNOSIS — F10988 Alcohol use, unspecified with other alcohol-induced disorder: Secondary | ICD-10-CM | POA: Diagnosis not present

## 2024-07-15 DIAGNOSIS — R202 Paresthesia of skin: Secondary | ICD-10-CM | POA: Diagnosis present

## 2024-07-15 DIAGNOSIS — I10 Essential (primary) hypertension: Secondary | ICD-10-CM | POA: Insufficient documentation

## 2024-07-15 DIAGNOSIS — M4802 Spinal stenosis, cervical region: Secondary | ICD-10-CM | POA: Diagnosis not present

## 2024-07-15 DIAGNOSIS — M5003 Cervical disc disorder with myelopathy, cervicothoracic region: Secondary | ICD-10-CM | POA: Diagnosis not present

## 2024-07-15 DIAGNOSIS — F17210 Nicotine dependence, cigarettes, uncomplicated: Secondary | ICD-10-CM | POA: Insufficient documentation

## 2024-07-15 NOTE — ED Triage Notes (Signed)
 First Nurse Note:  Pt BIB [facility] from [facility] Pt complaining of pain legs and feet due to neuropathy. Afraid to walk because he feels like he is going to fall.  VS: 127/75, 75, 100%  GSE 15

## 2024-07-15 NOTE — ED Triage Notes (Signed)
 Pt arrives via [facility] c/o bilateral leg/feet and hand pain due to neuropathy. Pt reports falling 6 times today due his legs giving out; unsure if he hit his head or not, denies LOC. NADN.

## 2024-07-16 ENCOUNTER — Observation Stay
Admission: EM | Admit: 2024-07-16 | Discharge: 2024-07-18 | Disposition: A | Discharge status: Home / Self Care | Payer: MEDICAID | Attending: Internal Medicine | Admitting: Internal Medicine

## 2024-07-16 ENCOUNTER — Encounter: Payer: Self-pay | Admitting: Internal Medicine

## 2024-07-16 ENCOUNTER — Emergency Department: Payer: MEDICAID

## 2024-07-16 DIAGNOSIS — E8729 Other acidosis: Secondary | ICD-10-CM

## 2024-07-16 DIAGNOSIS — G9520 Unspecified cord compression: Secondary | ICD-10-CM | POA: Diagnosis present

## 2024-07-16 DIAGNOSIS — M4802 Spinal stenosis, cervical region: Secondary | ICD-10-CM

## 2024-07-16 DIAGNOSIS — F1090 Alcohol use, unspecified, uncomplicated: Secondary | ICD-10-CM

## 2024-07-16 DIAGNOSIS — R296 Repeated falls: Secondary | ICD-10-CM | POA: Diagnosis not present

## 2024-07-16 DIAGNOSIS — M5030 Other cervical disc degeneration, unspecified cervical region: Secondary | ICD-10-CM

## 2024-07-16 DIAGNOSIS — G621 Alcoholic polyneuropathy: Secondary | ICD-10-CM

## 2024-07-16 DIAGNOSIS — M50223 Other cervical disc displacement at C6-C7 level: Secondary | ICD-10-CM

## 2024-07-16 DIAGNOSIS — I10 Essential (primary) hypertension: Secondary | ICD-10-CM | POA: Diagnosis present

## 2024-07-16 DIAGNOSIS — F1010 Alcohol abuse, uncomplicated: Secondary | ICD-10-CM | POA: Diagnosis present

## 2024-07-16 LAB — COMPREHENSIVE METABOLIC PANEL WITH GFR
ALT: 16 U/L (ref 0–44)
ALT: 15 U/L (ref 0–44)
AST: 39 U/L (ref 15–41)
AST: 37 U/L (ref 15–41)
Albumin: 4 g/dL (ref 3.5–5.0)
Albumin: 3.5 g/dL (ref 3.5–5.0)
Alkaline Phosphatase: 82 U/L (ref 38–126)
Alkaline Phosphatase: 72 U/L (ref 38–126)
Anion gap: 16 — ABNORMAL HIGH (ref 5–15)
Anion gap: 11 (ref 5–15)
BUN: 7 mg/dL (ref 6–20)
BUN: <5 mg/dL — ABNORMAL LOW (ref 6–20)
CO2: 14 mmol/L — ABNORMAL LOW (ref 22–32)
CO2: 20 mmol/L — ABNORMAL LOW (ref 22–32)
Calcium: 8.6 mg/dL — ABNORMAL LOW (ref 8.9–10.3)
Calcium: 8.5 mg/dL — ABNORMAL LOW (ref 8.9–10.3)
Chloride: 108 mmol/L (ref 98–111)
Chloride: 113 mmol/L — ABNORMAL HIGH (ref 98–111)
Creatinine, Ser: 0.76 mg/dL (ref 0.61–1.24)
Creatinine, Ser: 0.69 mg/dL (ref 0.61–1.24)
GFR, Estimated: >60 mL/min (ref >60)
GFR, Estimated: >60 mL/min (ref >60)
Glucose, Bld: 85 mg/dL (ref 70–99)
Glucose, Bld: 113 mg/dL — ABNORMAL HIGH (ref 70–99)
Potassium: 3.9 mmol/L (ref 3.5–5.1)
Potassium: 3.6 mmol/L (ref 3.5–5.1)
Sodium: 138 mmol/L (ref 135–145)
Sodium: 144 mmol/L (ref 135–145)
Total Bilirubin: 0.4 mg/dL (ref 0.0–1.2)
Total Bilirubin: 0.6 mg/dL (ref 0.0–1.2)
Total Protein: 8 g/dL (ref 6.5–8.1)
Total Protein: 7 g/dL (ref 6.5–8.1)

## 2024-07-16 LAB — URINE DRUG SCREEN, QUALITATIVE (ARMC ONLY)
Amphetamines, Ur Screen: NOT DETECTED
Barbiturates, Ur Screen: NOT DETECTED
Benzodiazepine, Ur Scrn: NOT DETECTED
Cannabinoid 50 Ng, Ur ~~LOC~~: NOT DETECTED
Cocaine Metabolite,Ur ~~LOC~~: NOT DETECTED
MDMA (Ecstasy)Ur Screen: NOT DETECTED
Methadone Scn, Ur: NOT DETECTED
Opiate, Ur Screen: NOT DETECTED
Phencyclidine (PCP) Ur S: NOT DETECTED
Tricyclic, Ur Screen: NOT DETECTED

## 2024-07-16 LAB — TSH
TSH: 1.263 u[IU]/mL (ref 0.350–4.500)
TSH: 1.513 u[IU]/mL (ref 0.350–4.500)

## 2024-07-16 LAB — CBC
HCT: 41.2 % (ref 39.0–52.0)
Hemoglobin: 14.1 g/dL (ref 13.0–17.0)
MCH: 32.6 pg (ref 26.0–34.0)
MCHC: 34.2 g/dL (ref 30.0–36.0)
MCV: 95.2 fL (ref 80.0–100.0)
Platelets: 150 K/uL (ref 150–400)
RBC: 4.33 MIL/uL (ref 4.22–5.81)
RDW: 11.5 % (ref 11.5–15.5)
WBC: 5.9 K/uL (ref 4.0–10.5)
nRBC: 0 % (ref 0.0–0.2)

## 2024-07-16 LAB — MAGNESIUM: Magnesium: 1.9 mg/dL (ref 1.7–2.4)

## 2024-07-16 LAB — ETHANOL: Alcohol, Ethyl (B): 242 mg/dL — ABNORMAL HIGH (ref <15)

## 2024-07-16 LAB — T4, FREE: Free T4: 0.84 ng/dL (ref 0.61–1.12)

## 2024-07-16 LAB — VITAMIN B12: Vitamin B-12: 197 pg/mL (ref 180–914)

## 2024-07-16 LAB — FOLATE: Folate: 11.6 ng/mL (ref >5.9)

## 2024-07-16 MED ORDER — LORAZEPAM 2 MG PO TABS: 0.0000 mg | ORAL_TABLET | Freq: Two times a day (BID) | ORAL | Status: DC

## 2024-07-16 MED ORDER — FERROUS SULFATE 325 (65 FE) MG PO TABS
325.0000 mg | ORAL_TABLET | Freq: Two times a day (BID) | ORAL | Status: DC
Start: 2024-07-16 — End: 2024-07-18
  Administered 2024-07-16 – 2024-07-18 (×5): 325 mg via ORAL
  Filled 2024-07-16 (×6): qty 1

## 2024-07-16 MED ORDER — SALINE SPRAY 0.65 % NA SOLN: 1.0000 | NASAL | Status: DC | PRN

## 2024-07-16 MED ORDER — LORAZEPAM 2 MG/ML IJ SOLN: 0.0000 mg | Freq: Four times a day (QID) | INTRAMUSCULAR | Status: AC

## 2024-07-16 MED ORDER — DEXTROSE 5 % AND 0.9 % NACL IV BOLUS
1000.0000 mL | Freq: Once | INTRAVENOUS | Status: AC
  Administered 2024-07-16: 1000 mL via INTRAVENOUS
  Filled 2024-07-16: qty 1000

## 2024-07-16 MED ORDER — VITAMIN C 500 MG PO TABS
250.0000 mg | ORAL_TABLET | Freq: Every day | ORAL | Status: DC
  Administered 2024-07-16 – 2024-07-18 (×3): 250 mg via ORAL
  Filled 2024-07-16 (×3): qty 1

## 2024-07-16 MED ORDER — THIAMINE HCL 100 MG/ML IJ SOLN
100.0000 mg | Freq: Once | INTRAMUSCULAR | Status: AC
  Administered 2024-07-16: 100 mg via INTRAVENOUS
  Filled 2024-07-16: qty 2

## 2024-07-16 MED ORDER — HYDRALAZINE HCL 20 MG/ML IJ SOLN: 10.0000 mg | Freq: Four times a day (QID) | INTRAMUSCULAR | Status: DC | PRN

## 2024-07-16 MED ORDER — ACETAMINOPHEN 650 MG RE SUPP: 650.0000 mg | Freq: Four times a day (QID) | RECTAL | Status: DC | PRN

## 2024-07-16 MED ORDER — HYDROCODONE-ACETAMINOPHEN 5-325 MG PO TABS: 1.0000 | ORAL_TABLET | ORAL | Status: DC | PRN

## 2024-07-16 MED ORDER — LORAZEPAM 2 MG/ML IJ SOLN: 0.0000 mg | Freq: Two times a day (BID) | INTRAMUSCULAR | Status: DC

## 2024-07-16 MED ORDER — ONDANSETRON HCL 4 MG PO TABS: 4.0000 mg | ORAL_TABLET | Freq: Four times a day (QID) | ORAL | Status: DC | PRN

## 2024-07-16 MED ORDER — DEXTROSE-SODIUM CHLORIDE 5-0.9 % IV SOLN: INTRAVENOUS | Status: AC

## 2024-07-16 MED ORDER — SODIUM CHLORIDE 0.9 % IV BOLUS (SEPSIS)
1000.0000 mL | Freq: Once | INTRAVENOUS | Status: AC
  Administered 2024-07-16: 1000 mL via INTRAVENOUS

## 2024-07-16 MED ORDER — PANTOPRAZOLE SODIUM 40 MG PO TBEC
40.0000 mg | DELAYED_RELEASE_TABLET | Freq: Two times a day (BID) | ORAL | Status: DC
  Administered 2024-07-16 – 2024-07-18 (×5): 40 mg via ORAL
  Filled 2024-07-16 (×5): qty 1

## 2024-07-16 MED ORDER — ONDANSETRON HCL 4 MG/2ML IJ SOLN: 4.0000 mg | Freq: Four times a day (QID) | INTRAMUSCULAR | Status: DC | PRN

## 2024-07-16 MED ORDER — ACETAMINOPHEN 325 MG PO TABS: 650.0000 mg | ORAL_TABLET | Freq: Four times a day (QID) | ORAL | Status: DC | PRN

## 2024-07-16 MED ORDER — LORAZEPAM 2 MG PO TABS: 0.0000 mg | ORAL_TABLET | Freq: Four times a day (QID) | ORAL | Status: AC

## 2024-07-16 MED ORDER — ALBUTEROL SULFATE (2.5 MG/3ML) 0.083% IN NEBU: 3.0000 mL | INHALATION_SOLUTION | Freq: Four times a day (QID) | RESPIRATORY_TRACT | Status: DC | PRN

## 2024-07-16 MED ORDER — MORPHINE SULFATE (PF) 2 MG/ML IV SOLN: 2.0000 mg | INTRAVENOUS | Status: DC | PRN

## 2024-07-16 NOTE — Plan of Care (Signed)

## 2024-07-16 NOTE — Consult Note (Signed)
 Consult requested by:  Dr. Roann  Consult requested for:  Cervical stenosis  Primary Physician:  Lance Green  History of Present Illness: 07/16/2024 [name] is here with a chief complaint of falls.  He is known to me from [facility] in 2023 for progressive myelopathy.  Yesterday he was out with his friend at the park and had 5-6 falls. He was drinking at the time.  He came to the hospital for evaluation due to his falls.    Overall he is much improved compared to prior to his surgery in 2023, but does think his balance is slightly worse.  Past Surgery: ACDF C3-6 in 03/2022 by me  Lance Green has symptoms of cervical myelopathy.  The symptoms are causing a significant impact on the patient's life.   I have utilized the care everywhere function in epic to review the outside records available from external health systems.  Review of Systems:  A 10 point review of systems is negative, except for the pertinent positives and negatives detailed in the HPI.  Past Medical History: Past Medical History:  Diagnosis Date   Alcohol abuse    HTN (hypertension)    Neuropathy    Pancreatitis    Peptic ulcer disease     Past Surgical History: Past Surgical History:  Procedure Laterality Date   ANTERIOR CERVICAL DECOMP/DISCECTOMY FUSION N/A 04/02/2022   Procedure: C3-6 ANTERIOR CERVICAL DECOMPRESSION/DISCECTOMY FUSION 3 LEVELS;  Surgeon: Lance Fret, MD;  Location: [facility] ORS;  Service: Neurosurgery;  Laterality: N/A;   APPENDECTOMY     INCISION AND DRAINAGE Left 11/11/2018   Procedure: INCISION AND DRAINAGE;  Surgeon: Lance Lynwood SQUIBB, MD;  Location: [facility] ORS;  Service: Orthopedics;  Laterality: Left;   LAPAROSCOPIC APPENDECTOMY N/A 07/06/2018   Procedure: APPENDECTOMY LAPAROSCOPIC;  Surgeon: Lance Romano, MD;  Location: [facility] ORS;  Service: General;  Laterality: N/A;    Allergies: Allergies as of 07/15/2024   (No Known Allergies)     Medications: No outpatient medications have been marked as taking for the 07/16/24 encounter [facility] Encounter).    Social History: Social History   Tobacco Use   Smoking status: Every Day    Current packs/day: 0.50    Types: Cigarettes   Smokeless tobacco: Never  Vaping Use   Vaping status: Never Used  Substance Use Topics   Alcohol use: Yes    Comment: daily 40 oz, reports 4 40oz/day   Drug use: No    Family Medical History: Family History  Problem Relation Age of Onset   Cataracts Mother     Physical Examination: Vitals:   07/16/24 0535 07/16/24 0801  BP: 122/80 119/75  Pulse: 76 82  Resp: 16 16  Temp: 98.1 F (36.7 C) 98.4 F (36.9 C)  SpO2: 97% 96%    General: Patient is in no apparent distress. Attention to examination is appropriate.  Neck:   Supple.    Respiratory: Patient is breathing without any difficulty.   NEUROLOGICAL:     Awake, alert, oriented to person, place, and time.  Speech is clear and fluent.  Cranial Nerves: Pupils equal round and reactive to light.  Facial tone is symmetric.  Facial sensation is symmetric. Shoulder shrug is symmetric. Tongue protrusion is midline.   Strength: Side Biceps Triceps Deltoid Interossei Grip Wrist Ext. Wrist Flex.  R 5 5 5 5  4+ 5 5  L 5 5 5 5  4+ 5 5   Side Iliopsoas Quads Hamstring PF DF EHL  R  5 5 5 5 5 5   L 5 5 5 5 5 5    Reflexes are 3+ and symmetric at the biceps, triceps, brachioradialis, patella and achilles.   Hoffman's is present.   Bilateral upper and lower extremity sensation is symmetric and diminished to light touch.    No evidence of dysmetria noted.  Gait is untested but he did walk to bathroom with nurse.     Medical Decision Making  Imaging: MRI C spine 07/16/2024 IMPRESSION: 1. Progressive severe canal stenosis at C6-C7. 2. Severe foraminal stenosis at multiple levels, as  detailed above. 3. Interval ACDF at C3-C6 with improved canal stenosis at these levels. 4.  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   Electronically signed by: Lance Molt MD 07/16/2024 03:48 AM EDT RP Workstation: [id number]  I have personally reviewed the images and agree with the above interpretation.  Assessment and Plan: [name] is a pleasant 48 y.o. 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 He was able to walk this AM after his alcohol had cleared.  - OK to eat - work with PTOT - CT C spine to evaluate fusion - Will likely need posterior cervical decompression and fusion due to the adjacent segment disease.  If he does well with PTOT, I will arrange outpatient follow up to discuss elective timing of surgical intervention.    I have communicated my recommendations to the requesting physician and coordinated care to facilitate these recommendations.     Lance Green K. Clois MD, [facility]

## 2024-07-16 NOTE — H&P (Signed)
 History and Physical    MURRIEL EIDEM FMW:[id number] DOB: 1976-09-08 DOA: 07/16/2024  DOS: the patient was seen and examined on 07/16/2024  PCP: [facility]   Patient coming from: Home  I have personally briefly reviewed patient's old medical records in [facility]  Chief Complaint: Leg pain, hand pain and numbness  HPI: Lance Green is a pleasant 48 y.o. male with medical history significant for alcohol use disorder, HTN, chronic peripheral neuropathy with history of C3-C6 ACDF 2023 who came into ED complaining of progressively worsening numbness in his hands and feet that has been ongoing for 3 years.  Patient reports that he had of 6 falls in the last 24 hours and feels like he has hard time walking.  He stated that he was walking in the park and he was not able to walk due to his legs gave away.  He denies any neck pain but he says he feels like he cannot sleep during the night due to discomfort in his neck.  He denies any head trauma or trauma to any part of his body after the fall.  He denies to be in any blood thinners.  He stated that he has been drinking alcohol every day since he was 22-year-old.  He drinks every single day.  He denies using any drugs.  He denies any fever, chills, nausea, vomiting, shortness of breath, chest pain.  He denies any bladder or bowel incontinence.  ED Course: Upon arrival to the ED, patient is found to have some numbness no motor weakness, his bicarb was 14, increased anion gap, MRI of the brain and spine showed progressive C6-C7 cannula stenosis, multiple severe spinal stenosis, myelomalacia and cord atrophy at C4-C5.  Hospitalist service was consulted for evaluation for admission for possible neurosurgical evaluation, alcohol withdrawal and frequent falls.  Review of Systems:  ROS  All other systems negative except as noted in the HPI.  Past Medical History:  Diagnosis Date   Alcohol abuse    HTN (hypertension)    Neuropathy     Pancreatitis    Peptic ulcer disease     Past Surgical History:  Procedure Laterality Date   ANTERIOR CERVICAL DECOMP/DISCECTOMY FUSION N/A 04/02/2022   Procedure: C3-6 ANTERIOR CERVICAL DECOMPRESSION/DISCECTOMY FUSION 3 LEVELS;  Surgeon: Clois Fret, MD;  Location: [facility] ORS;  Service: Neurosurgery;  Laterality: N/A;   APPENDECTOMY     INCISION AND DRAINAGE Left 11/11/2018   Procedure: INCISION AND DRAINAGE;  Surgeon: Mardee Lynwood SQUIBB, MD;  Location: [facility] ORS;  Service: Orthopedics;  Laterality: Left;   LAPAROSCOPIC APPENDECTOMY N/A 07/06/2018   Procedure: APPENDECTOMY LAPAROSCOPIC;  Surgeon: Rodolph Romano, MD;  Location: [facility] ORS;  Service: General;  Laterality: N/A;     reports that he has been smoking cigarettes. He has never used smokeless tobacco. He reports current alcohol use. He reports that he does not use drugs.  No Known Allergies  Family History  Problem Relation Age of Onset   Cataracts Mother     Prior to Admission medications   Medication Sig Start Date End Date Taking? Authorizing Provider  albuterol  (VENTOLIN  HFA) 108 (90 Base) MCG/ACT inhaler Inhale 2 puffs into the lungs every 6 (six) hours as needed for wheezing or shortness of breath. 06/18/24   Janit Kast, PA-C  ferrous sulfate  325 (65 FE) MG EC tablet Take 1 tablet (325 mg total) by mouth 2 (two) times daily. 06/18/24   Janit Kast, PA-C  pantoprazole  (PROTONIX ) 40 MG tablet Take  1 tablet (40 mg total) by mouth 2 (two) times daily. 12/21/23   Jens Durand, MD  vitamin C  (ASCORBIC ACID ) 250 MG tablet Take 1 tablet (250 mg total) by mouth daily. 06/18/24   Janit Kast, PA-C    Physical Exam: Vitals:   07/16/24 0500 07/16/24 0535 07/16/24 0550 07/16/24 0801  BP: (!) 145/91 122/80  119/75  Pulse: 79 76  82  Resp: 19 16  16   Temp: 98.1 F (36.7 C) 98.1 F (36.7 C)  98.4 F (36.9 C)  TempSrc:    Oral  SpO2: 94% 97%  96%  Weight:   65 kg   Height:   5' 7.01 (1.702 m)      Physical Exam   Constitutional: Alert, awake, calm, comfortable HEENT: Neck supple Respiratory: Clear to auscultation B/L, no wheezing, no rales.  Cardiovascular: Regular rate and rhythm, no murmurs / rubs / gallops. No extremity edema. 2+ pedal pulses. No carotid bruits.  Abdomen: Soft, no tenderness, Bowel sounds positive.  Musculoskeletal: no clubbing / cyanosis. Good ROM, no contractures. Normal muscle tone.  Skin: no rashes, lesions, ulcers. Neurologic: CN 2-12 grossly intact. Sensation intact, No focal deficit identified Psychiatric: Alert and oriented x 3. Normal mood.    Labs on Admission: I have personally reviewed following labs and imaging studies  CBC: Recent Labs  Lab 07/16/24 0158  WBC 5.9  HGB 14.1  HCT 41.2  MCV 95.2  PLT 150   Basic Metabolic Panel: Recent Labs  Lab 07/16/24 0158  NA 138  K 3.9  CL 108  CO2 14*  GLUCOSE 85  BUN 7  CREATININE 0.76  CALCIUM 8.6*  MG 1.9   GFR: Estimated Creatinine Clearance: 102.7 mL/min (by C-G formula based on SCr of 0.76 mg/dL). Liver Function Tests: Recent Labs  Lab 07/16/24 0158  AST 39  ALT 16  ALKPHOS 82  BILITOT 0.4  PROT 8.0  ALBUMIN 4.0   No results for input(s): LIPASE, AMYLASE in the last 168 hours. No results for input(s): AMMONIA in the last 168 hours. Coagulation Profile: No results for input(s): INR, PROTIME in the last 168 hours. Cardiac Enzymes: No results for input(s): CKTOTAL, CKMB, CKMBINDEX, TROPONINI, TROPONINIHS in the last 168 hours. BNP (last 3 results) No results for input(s): BNP in the last 8760 hours. HbA1C: No results for input(s): HGBA1C in the last 72 hours. CBG: No results for input(s): GLUCAP in the last 168 hours. Lipid Profile: No results for input(s): CHOL, HDL, LDLCALC, TRIG, CHOLHDL, LDLDIRECT in the last 72 hours. Thyroid  Function Tests: Recent Labs    07/16/24 0158  TSH 1.263  FREET4 0.84   Anemia Panel: No  results for input(s): VITAMINB12, FOLATE, FERRITIN, TIBC, IRON, RETICCTPCT in the last 72 hours. Urine analysis:    Component Value Date/Time   COLORURINE COLORLESS (A) 12/16/2023 2216   APPEARANCEUR CLEAR (A) 12/16/2023 2216   APPEARANCEUR Hazy 01/18/2014 1701   LABSPEC 1.001 (L) 12/16/2023 2216   LABSPEC 1.013 01/18/2014 1701   PHURINE 5.0 12/16/2023 2216   GLUCOSEU NEGATIVE 12/16/2023 2216   GLUCOSEU Negative 01/18/2014 1701   HGBUR NEGATIVE 12/16/2023 2216   BILIRUBINUR NEGATIVE 12/16/2023 2216   BILIRUBINUR Negative 01/18/2014 1701   KETONESUR NEGATIVE 12/16/2023 2216   PROTEINUR NEGATIVE 12/16/2023 2216   NITRITE NEGATIVE 12/16/2023 2216   LEUKOCYTESUR NEGATIVE 12/16/2023 2216   LEUKOCYTESUR Negative 01/18/2014 1701    Radiological Exams on Admission: I have personally reviewed images MR Cervical Spine Wo Contrast Result Date: 07/16/2024 EXAM: MRI  CERVICAL SPINE WITHOUT CONTRAST 07/16/2024 03:27:23 AM TECHNIQUE: Multiplanar multisequence MRI of the cervical spine was performed. COMPARISON: MRI Cervical spine March 28, 2022 CLINICAL HISTORY: Myelopathy, acute, cervical spine. Pt arrives via ems c/o bilateral leg/feet and hand pain due to neuropathy. Pt reports falling 6 times today due his legs giving out. FINDINGS: BONES AND ALIGNMENT: Normal alignment. Normal vertebral body heights. No acute fracture. C3-C6 ACDF. SPINAL CORD: C4-C5 Cord T2 hyperintensity and cord atrophy. SOFT TISSUES: No paraspinal mass. C2-C3: No significant disc herniation. No spinal canal stenosis or neural foraminal narrowing. C3-C4: ACDF. Bilateral facet and uncovertebral protrusion with severe bilateral foraminal stenosis. Mild to moderate canal stenosis. C4-C5: ACDF. Bilateral facet and uncovertebral protrusion with severe bilateral foraminal stenosis. Mild to moderate canal stenosis. C5-C6: Bilateral facet and uncovertebral protrusion with severe left and moderate right foraminal stenosis. No  significant canal stenosis C6-C7: Posterior disc osteophyte complex and ligamentum flavum thickening with severe canal stenosis, progressed from the prior. Left greater than right facet hypertrophy with moderate to severe left and moderate right foraminal stenosis. C7-T1: No significant disc herniation. No spinal canal stenosis or neural foraminal narrowing. IMPRESSION: 1. Progressive severe canal stenosis at C6-C7. 2. Severe foraminal stenosis at multiple levels, as  detailed above. 3. Interval ACDF at C3-C6 with improved canal stenosis at these levels. 4. 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 Electronically signed by: Gilmore Molt MD 07/16/2024 03:48 AM EDT RP Workstation: [id number]   MR BRAIN WO CONTRAST Result Date: 07/16/2024 EXAM: MR Brain without Intravenous Contrast. CLINICAL HISTORY: Ataxia, head trauma; Neuro deficit, acute, stroke suspected. Pt arrives via ems c/o bilateral leg/feet and hand pain due to neuropathy. Pt reports falling 6 times today due his legs giving out. TECHNIQUE: Magnetic resonance images of the brain without intravenous contrast in multiple planes. CONTRAST: Without. COMPARISON: MRI head March 28, 2022 FINDINGS: BRAIN: No restricted diffusion to indicate acute infarction. No intracranial mass or hemorrhage. No midline shift or extra-axial fluid collection. 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 The central arterial and venous flow voids are patent. VENTRICLES: No hydrocephalus. ORBITS: The orbits are normal. SINUSES AND MASTOIDS: Maxillary sinus mucosal thickening. BONES: No acute fracture or focal osseous lesion. IMPRESSION: 1. No evidence of acute abnormality. Electronically signed by: Gilmore Molt MD 07/16/2024 03:38 AM EDT RP Workstation: [id number]    EK G: N/A    Assessment/Plan Principal Problem:   Frequent falls Active Problems:   ETOH abuse   Essential hypertension   Cervical cord compression with myelopathy (HCC)     Assessment and Plan: 48 year old male double/PMH of alcohol use disorder, essential hypertension, history of cervical cord compression s/p ACDF in 2023 who came into ED complaining of numbness, frequent falls and neck pain.  1.  Frequent falls, numbness with history of ACDF in 2023 - He will be placed in observation - Pain medications - PT/OT evaluation - Will check B12, folate, TSH - Neurosurgical evaluation  2.  Alcohol use disorder - He does not appear to be in withdrawal at this point - I have seen in the past that he had a seizure - Will place him on CIWA protocol - Folate, thiamine , multivitamins, IV fluid - Watch for DTs/seizure - Continue to monitor in telemetry  3.  HTN - Continue monitor blood pressure -*CIWA protocol and pain control hopefully will get better - Hydralazine  as needed for systolic blood pressure more than 160  4.  Possible starvation ketosis - Patient bicarb 14, anion gap of 16 -  Continue IV fluid with dextrose  - Will continue to monitor  5.  GERD - Resume his home dose of Protonix        DVT prophylaxis: Lovenox  Code Status: Full Code Family Communication: None Disposition Plan: Home Consults called: Neurosurgery Admission status: Observation, Telemetry bed   Nena Rebel, MD [facility] 07/16/2024, 10:16 AM

## 2024-07-16 NOTE — ED Notes (Signed)
 2 unsuccessful straight sticks, lab called at this time.

## 2024-07-16 NOTE — ED Provider Notes (Signed)
 [facility] Provider Note    Event Date/Time   First MD Initiated Contact with Patient 07/16/24 0206     (approximate)   History   Leg Pain, Hand Pain, and Numbness (BUE&BLE (chronic) )   HPI  Lance Green is a 48 y.o. male with history of alcohol use disorder, hypertension, chronic peripheral neuropathy who presents to the emergency department with progressively worsening numbness in his hands and feet that has been ongoing for 3 years.  He reports today however he has had 6 falls in the past 24 hours and feels like he has a hard time walking.  He states he is not sure why he feels like he is falling or having a hard time walking.  He does have a history of previous cervical spine ACDF in June 2023.  He denies any new neck pain.  He thinks he may have hit his head during these falls but is not sure.  He is not on blood thinners and not having headache.  He states he has been drinking alcohol the day reports he has been drinking alcohol every day since 4 years and reports he has been drinking alcohol since he was 48 years old.  He denies any drug use.  No chest pain, shortness of breath, vomiting, diarrhea, fever.  No bowel or bladder incontinence.   History provided by patient.    Past Medical History:  Diagnosis Date   Alcohol abuse    HTN (hypertension)    Neuropathy    Pancreatitis    Peptic ulcer disease     Past Surgical History:  Procedure Laterality Date   ANTERIOR CERVICAL DECOMP/DISCECTOMY FUSION N/A 04/02/2022   Procedure: C3-6 ANTERIOR CERVICAL DECOMPRESSION/DISCECTOMY FUSION 3 LEVELS;  Surgeon: Clois Fret, MD;  Location: [facility] ORS;  Service: Neurosurgery;  Laterality: N/A;   APPENDECTOMY     INCISION AND DRAINAGE Left 11/11/2018   Procedure: INCISION AND DRAINAGE;  Surgeon: Mardee Lynwood SQUIBB, MD;  Location: [facility] ORS;  Service: Orthopedics;  Laterality: Left;   LAPAROSCOPIC APPENDECTOMY N/A 07/06/2018   Procedure: APPENDECTOMY  LAPAROSCOPIC;  Surgeon: Rodolph Romano, MD;  Location: [facility] ORS;  Service: General;  Laterality: N/A;    MEDICATIONS:  Prior to Admission medications   Medication Sig Start Date End Date Taking? Authorizing Provider  albuterol  (VENTOLIN  HFA) 108 (90 Base) MCG/ACT inhaler Inhale 2 puffs into the lungs every 6 (six) hours as needed for wheezing or shortness of breath. 06/18/24   Janit Kast, PA-C  ferrous sulfate  325 (65 FE) MG EC tablet Take 1 tablet (325 mg total) by mouth 2 (two) times daily. 06/18/24   Janit Kast, PA-C  pantoprazole  (PROTONIX ) 40 MG tablet Take 1 tablet (40 mg total) by mouth 2 (two) times daily. 12/21/23   Jens Durand, MD  vitamin C  (ASCORBIC ACID ) 250 MG tablet Take 1 tablet (250 mg total) by mouth daily. 06/18/24   Janit Kast, PA-C    Physical Exam   Triage Vital Signs: ED Triage Vitals [07/15/24 2340]  Encounter Vitals Group     BP 106/78     Girls Systolic BP Percentile      Girls Diastolic BP Percentile      Boys Systolic BP Percentile      Boys Diastolic BP Percentile      Pulse Rate 74     Resp 16     Temp 97.7 F (36.5 C)     Temp Source Oral     SpO2 97 %  Weight      Height      Head Circumference      Peak Flow      Pain Score 5     Pain Loc      Pain Education      Exclude from Growth Chart     Most recent vital signs: Vitals:   07/16/24 0500 07/16/24 0535  BP: (!) 145/91 122/80  Pulse: 79 76  Resp: 19 16  Temp: 98.1 F (36.7 C) 98.1 F (36.7 C)  SpO2: 94% 97%     CONSTITUTIONAL: Alert, responds appropriately to questions. Well-appearing; well-nourished; GCS 15 HEAD: Normocephalic; atraumatic EYES: Conjunctivae clear, PERRL, EOMI ENT: normal nose; no rhinorrhea; moist mucous membranes; pharynx without lesions noted; no dental injury; no septal hematoma, no epistaxis; no facial deformity or bony tenderness NECK: Supple, no midline spinal tenderness, step-off or deformity; trachea midline CARD: RRR; S1 and  S2 appreciated; no murmurs, no clicks, no rubs, no gallops RESP: Normal chest excursion without splinting or tachypnea; breath sounds clear and equal bilaterally; no wheezes, no rhonchi, no rales; no hypoxia or respiratory distress CHEST:  chest wall stable, no crepitus or ecchymosis or deformity, nontender to palpation; no flail chest ABD/GI: Non-distended; soft, non-tender, no rebound, no guarding; no ecchymosis or other lesions noted PELVIS:  stable, nontender to palpation BACK:  The back appears normal; no midline spinal tenderness, step-off or deformity EXT: Normal ROM in all joints; no edema; normal capillary refill; no cyanosis, no bony tenderness or bony deformity of patient's extremities, no joint effusions, compartments are soft, extremities are warm and well-perfused, no ecchymosis SKIN: Normal color for age and race; warm NEURO: No facial asymmetry, normal speech, moving all extremities equally  ED Results / Procedures / Treatments   LABS: (all labs ordered are listed, but only abnormal results are displayed) Labs Reviewed  COMPREHENSIVE METABOLIC PANEL WITH GFR - Abnormal; Notable for the following components:      Result Value   CO2 14 (*)    Calcium 8.6 (*)    Anion gap 16 (*)    All other components within normal limits  ETHANOL - Abnormal; Notable for the following components:   Alcohol, Ethyl (B) 242 (*)    All other components within normal limits  CBC  MAGNESIUM   URINE DRUG SCREEN, QUALITATIVE ([facility] ONLY)  TSH  T4, FREE     EKG:  EKG Interpretation Date/Time:    Ventricular Rate:    PR Interval:    QRS Duration:    QT Interval:    QTC Calculation:   R Axis:      Text Interpretation:            RADIOLOGY: My personal review and interpretation of imaging: MRI shows cervical spine stenosis.  No stroke.  No intracranial hemorrhage.  I have personally reviewed all radiology reports. MR Cervical Spine Wo Contrast Result Date: 07/16/2024 EXAM: MRI  CERVICAL SPINE WITHOUT CONTRAST 07/16/2024 03:27:23 AM TECHNIQUE: Multiplanar multisequence MRI of the cervical spine was performed. COMPARISON: MRI Cervical spine March 28, 2022 CLINICAL HISTORY: Myelopathy, acute, cervical spine. Pt arrives via ems c/o bilateral leg/feet and hand pain due to neuropathy. Pt reports falling 6 times today due his legs giving out. FINDINGS: BONES AND ALIGNMENT: Normal alignment. Normal vertebral body heights. No acute fracture. C3-C6 ACDF. SPINAL CORD: C4-C5 Cord T2 hyperintensity and cord atrophy. SOFT TISSUES: No paraspinal mass. C2-C3: No significant disc herniation. No spinal canal stenosis or neural foraminal narrowing. C3-C4: ACDF. Bilateral  facet and uncovertebral protrusion with severe bilateral foraminal stenosis. Mild to moderate canal stenosis. C4-C5: ACDF. Bilateral facet and uncovertebral protrusion with severe bilateral foraminal stenosis. Mild to moderate canal stenosis. C5-C6: Bilateral facet and uncovertebral protrusion with severe left and moderate right foraminal stenosis. No significant canal stenosis C6-C7: Posterior disc osteophyte complex and ligamentum flavum thickening with severe canal stenosis, progressed from the prior. Left greater than right facet hypertrophy with moderate to severe left and moderate right foraminal stenosis. C7-T1: No significant disc herniation. No spinal canal stenosis or neural foraminal narrowing. IMPRESSION: 1. Progressive severe canal stenosis at C6-C7. 2. Severe foraminal stenosis at multiple levels, as  detailed above. 3. Interval ACDF at C3-C6 with improved canal stenosis at these levels. 4. 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 Electronically signed by: Gilmore Molt MD 07/16/2024 03:48 AM EDT RP Workstation: [id number]   MR BRAIN WO CONTRAST Result Date: 07/16/2024 EXAM: MR Brain without Intravenous Contrast. CLINICAL HISTORY: Ataxia, head trauma; Neuro deficit, acute, stroke suspected. Pt arrives via ems c/o  bilateral leg/feet and hand pain due to neuropathy. Pt reports falling 6 times today due his legs giving out. TECHNIQUE: Magnetic resonance images of the brain without intravenous contrast in multiple planes. CONTRAST: Without. COMPARISON: MRI head March 28, 2022 FINDINGS: BRAIN: No restricted diffusion to indicate acute infarction. No intracranial mass or hemorrhage. No midline shift or extra-axial fluid collection. 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 The central arterial and venous flow voids are patent. VENTRICLES: No hydrocephalus. ORBITS: The orbits are normal. SINUSES AND MASTOIDS: Maxillary sinus mucosal thickening. BONES: No acute fracture or focal osseous lesion. IMPRESSION: 1. No evidence of acute abnormality. Electronically signed by: Gilmore Molt MD 07/16/2024 03:38 AM EDT RP Workstation: [id number]     PROCEDURES:  Critical Care performed: Yes, see critical care procedure note(s)   CRITICAL CARE Performed by: Josette Sink   Total critical care time: 30 minutes  Critical care time was exclusive of separately billable procedures and treating other patients.  Critical care was necessary to treat or prevent imminent or life-threatening deterioration.  Critical care was time spent personally by me on the following activities: development of treatment plan with patient and/or surrogate as well as nursing, discussions with consultants, evaluation of patient's response to treatment, examination of patient, obtaining history from patient or surrogate, ordering and performing treatments and interventions, ordering and review of laboratory studies, ordering and review of radiographic studies, pulse oximetry and re-evaluation of patient's condition.   SABRA1-3 Lead EKG Interpretation  Performed by: Terie Lear, Josette SAILOR, DO Authorized by: Shaquon Gropp, Josette SAILOR, DO     Interpretation: normal     ECG rate:  76   ECG rate assessment: normal     Rhythm: sinus rhythm      Ectopy: none     Conduction: normal       IMPRESSION / MDM / ASSESSMENT AND PLAN / ED COURSE  I reviewed the triage vital signs and the nursing notes.  Patient here with increasing numbness in his hands, feet and difficulty walking with frequent falls today.  The patient is on the cardiac monitor to evaluate for evidence of arrhythmia and/or significant heart rate changes.   DIFFERENTIAL DIAGNOSIS (includes but not limited to):   Alcohol use disorder, alcohol intoxication, electrolyte derangement, B12 deficiency, thiamine  deficiency, cervical myelopathy, critical spinal stenosis, cervical fracture, less likely intracranial hemorrhage or stroke, anemia, thyroid  dysfunction, electrolyte derangement  Patient's presentation is most consistent with acute presentation with potential threat  to life or bodily function.  PLAN: Will obtain labs, urine, MRI of the brain and cervical spine.  Will give IV fluids, thiamine .   MEDICATIONS GIVEN IN ED: Medications  LORazepam  (ATIVAN ) injection 0-4 mg ( Intravenous Not Given 07/16/24 0420)    Or  LORazepam  (ATIVAN ) tablet 0-4 mg ( Oral See Alternative 07/16/24 0420)  LORazepam  (ATIVAN ) injection 0-4 mg (has no administration in time range)    Or  LORazepam  (ATIVAN ) tablet 0-4 mg (has no administration in time range)  dextrose  5 %-0.9 % sodium chloride  infusion ( Intravenous Infusion Verify 07/16/24 0557)  acetaminophen  (TYLENOL ) tablet 650 mg (has no administration in time range)    Or  acetaminophen  (TYLENOL ) suppository 650 mg (has no administration in time range)  ondansetron  (ZOFRAN ) tablet 4 mg (has no administration in time range)    Or  ondansetron  (ZOFRAN ) injection 4 mg (has no administration in time range)  HYDROcodone -acetaminophen  (NORCO/VICODIN) 5-325 MG per tablet 1-2 tablet (has no administration in time range)  morphine  (PF) 2 MG/ML injection 2 mg (has no administration in time range)  sodium chloride  0.9 % bolus 1,000 mL (0  mLs Intravenous Stopped 07/16/24 0441)  thiamine  (VITAMIN B1) injection 100 mg (100 mg Intravenous Given 07/16/24 0250)  dextrose  5 % and 0.9% NaCl 5-0.9 % bolus 1,000 mL (0 mLs Intravenous Stopped 07/16/24 0441)     ED COURSE: Labs show bicarb of 16, elevated anion gap likely secondary to alcoholic ketoacidosis.  Alcohol level greater than 200.  Patient is receiving NS IV fluids, will switch to D5 normal saline.  Normal potassium magnesium .  Normal thyroid  function.  MRI of the brain reviewed and interpreted by myself and radiologist and shows no acute abnormality.  MRI of the cervical spine however shows progressively worsening stenosis at C6-C7 but no cord edema.  I suspect that his frequent falls are likely multifactorial from acute alcohol intoxication, chronic alcoholic peripheral neuropathy and worsening cervical stenosis.  Have recommended admission for IV hydration for his metabolic acidosis and will have neurosurgery consult  on him as well as I am concerned that if he is still having a difficult time walking and continues to fall once he is clinically sober that he may need surgical intervention.  Will keep him n.p.o. at this time.  I have placed him on CIWA protocol.  No signs of withdrawal currently.   CONSULTS: Dr. Clois with neurosurgery consulted who will see patient in the morning.  Appreciate his help.  Consulted and discussed patient's case with hospitalist, Dr. Cleatus.  I have recommended admission and consulting physician agrees and will place admission orders.  Patient (and family if present) agree with this plan.   I reviewed all nursing notes, vitals, pertinent previous records.  All labs, EKGs, imaging ordered have been independently reviewed and interpreted by myself.    OUTSIDE RECORDS REVIEWED: Reviewed last PCP note on 04/01/2024.       FINAL CLINICAL IMPRESSION(S) / ED DIAGNOSES   Final diagnoses:  Frequent falls  Alcohol use disorder  Alcoholic  ketoacidosis  Cervical spinal stenosis  Alcoholic peripheral neuropathy     Rx / DC Orders   ED Discharge Orders     None        Note:  This document was prepared using Dragon voice recognition software and may include unintentional dictation errors.   Stiles Maxcy, Josette SAILOR, DO 07/16/24 [phone]

## 2024-07-16 NOTE — Evaluation (Signed)
 Occupational Therapy Evaluation Patient Details Name: Lance Green MRN: [id number] DOB: Sep 24, 1976 Today's Date: 07/16/2024   History of Present Illness   Lance Green is a pleasant 48 y.o. male with medical history significant for alcohol use disorder, HTN, chronic peripheral neuropathy with history of C3-C6 ACDF 2023 who came into ED complaining of progressively worsening numbness in his hands and feet that has been ongoing for 3 years. Neurosurgery is consulted.     Clinical Impressions Mr. [name] was seen for OT evaluation this date. Prior to hospital admission, pt was generally independent with ADL management. He endorses frequent falls in the last 6 months with multiple falls the day prior to admission. Pt denies use of AE/DME at baseline. States family does assist him with some IADL management including cooking 2/2 his [facility] deficits/neuropathy. Pt lives with his mother in a 1 level duplex with ~2-3 STE. Pt presents with deficits in strength, balance, and activity awareness, affecting safe and optimal ADL completion at PLOF. Pt currently requires SUPERVISION/SET UP assist for functional mobility, toileting, and LB dressing.  Pt would benefit from skilled OT services to address noted impairments and functional limitations (see below for any additional details) in order to maximize safety and independence while minimizing future risk of falls, injury, and readmission. Anticipate the need for follow up OT services upon acute hospital DC.      If plan is discharge home, recommend the following:   A little help with walking and/or transfers;A little help with bathing/dressing/bathroom;Help with stairs or ramp for entrance;Assist for transportation;Assistance with cooking/housework     Functional Status Assessment   Patient has had a recent decline in their functional status and demonstrates the ability to make significant improvements in function in a reasonable and predictable amount  of time.     Equipment Recommendations   BSC/3in1     Recommendations for Other Services         Precautions/Restrictions   Precautions Precautions: Fall Recall of Precautions/Restrictions: Intact Restrictions Weight Bearing Restrictions Per Provider Order: No     Mobility Bed Mobility Overal bed mobility: Needs Assistance Bed Mobility: Supine to Sit, Sit to Supine     Supine to sit: Modified independent (Device/Increase time), HOB elevated, Used rails Sit to supine: Modified independent (Device/Increase time), +2 for safety/equipment, HOB elevated        Transfers Overall transfer level: Needs assistance Equipment used: Rolling walker (2 wheels) Transfers: Sit to/from Stand Sit to Stand: Supervision           General transfer comment: cueing for safe use of RW      Balance Overall balance assessment: Needs assistance Sitting-balance support: Feet supported, No upper extremity supported Sitting balance-Leahy Scale: Good     Standing balance support: Reliant on assistive device for balance, During functional activity, Single extremity supported Standing balance-Leahy Scale: Fair                             ADL either performed or assessed with clinical judgement   ADL Overall ADL's : Needs assistance/impaired                                       General ADL Comments: SET UP/SUPERVISION for bed/functional mobility, toilet transfer, toileting and to don mesh underwear.     Vision Patient Visual Report: No change from baseline  Perception         Praxis         Pertinent Vitals/Pain Pain Assessment Pain Assessment: No/denies pain     Extremity/Trunk Assessment Upper Extremity Assessment Upper Extremity Assessment: Overall WFL for tasks assessed   Lower Extremity Assessment Lower Extremity Assessment: Generalized weakness       Communication Communication Communication: No apparent  difficulties   Cognition Arousal: Alert Behavior During Therapy: WFL for tasks assessed/performed Cognition: No apparent impairments                               Following commands: Intact       Cueing  General Comments   Cueing Techniques: Verbal cues      Exercises Other Exercises Other Exercises: Pt educated on role of OT in acute setting, safety, falls prevention strategies, and DC recs.   Shoulder Instructions      Home Living Family/patient expects to be discharged to:: Private residence Living Arrangements: Parent Available Help at Discharge: Family;Available 24 hours/day Type of Home: Apartment (duplex) Home Access: Stairs to enter Entrance Stairs-Number of Steps: 2 Entrance Stairs-Rails: None Home Layout: One level     Bathroom Shower/Tub: Chief Strategy Officer: Standard     Home Equipment: None          Prior Functioning/Environment Prior Level of Function : Independent/Modified Independent             Mobility Comments: Pt denies use of AE PTA, endorses multiple falls in last 6 months ADLs Comments: Per pt, he is generally independent with ADL/IADL management. His family does assist with IADL management including cooking 2/2 baseline [facility] deficits/neuropathy in his hands.    OT Problem List: Decreased strength;Decreased coordination;Decreased activity tolerance;Decreased safety awareness;Impaired balance (sitting and/or standing);Decreased knowledge of use of DME or AE   OT Treatment/Interventions: Self-care/ADL training;Therapeutic exercise;Therapeutic activities;DME and/or AE instruction;Patient/family education;Balance training;Energy conservation      OT Goals(Current goals can be found in the care plan section)   Acute Rehab OT Goals Patient Stated Goal: To go home OT Goal Formulation: With patient Time For Goal Achievement: 07/30/24 Potential to Achieve Goals: Good ADL Goals Pt Will Perform Grooming: (P)  with modified independence;standing;with adaptive equipment (c LRAD PRN) Pt Will Perform Lower Body Dressing: (P) with modified independence;sit to/from stand (c LRAD PRN) Pt Will Transfer to Toilet: (P) ambulating;regular height toilet;with modified independence (c LRAD PRN) Pt Will Perform Toileting - Clothing Manipulation and hygiene: (P) sit to/from stand;with modified independence;with adaptive equipment (c LRAD PRN)   OT Frequency:  Min 1X/week    Co-evaluation              AM-PAC OT 6 Clicks Daily Activity     Outcome Measure Help from another person eating meals?: None Help from another person taking care of personal grooming?: A Little Help from another person toileting, which includes using toliet, bedpan, or urinal?: A Little Help from another person bathing (including washing, rinsing, drying)?: A Little Help from another person to put on and taking off regular upper body clothing?: A Little Help from another person to put on and taking off regular lower body clothing?: A Little 6 Click Score: 19   End of Session Equipment Utilized During Treatment: Gait belt;Rolling walker (2 wheels)  Activity Tolerance: Patient tolerated treatment well Patient left: in bed;with call bell/phone within reach;with bed alarm set  OT Visit Diagnosis: Other abnormalities of  gait and mobility (R26.89);Muscle weakness (generalized) (M62.81)                Time: 8660-8597 OT Time Calculation (min): 23 min Charges:  OT General Charges $OT Visit: 1 Visit OT Evaluation $OT Eval Moderate Complexity: 1 Mod OT Treatments $Self Care/Home Management : 8-22 mins  Jhonny Pelton, M.S., OTR/L 07/16/24, 2:43 PM

## 2024-07-17 ENCOUNTER — Observation Stay: Payer: MEDICAID

## 2024-07-17 DIAGNOSIS — R296 Repeated falls: Secondary | ICD-10-CM | POA: Diagnosis not present

## 2024-07-17 LAB — MAGNESIUM
Magnesium: 1.1 mg/dL — ABNORMAL LOW (ref 1.7–2.4)
Magnesium: 2.2 mg/dL (ref 1.7–2.4)

## 2024-07-17 LAB — RENAL FUNCTION PANEL
Albumin: 3.1 g/dL — ABNORMAL LOW (ref 3.5–5.0)
Anion gap: 6 (ref 5–15)
BUN: 5 mg/dL — ABNORMAL LOW (ref 6–20)
CO2: 24 mmol/L (ref 22–32)
Calcium: 8.6 mg/dL — ABNORMAL LOW (ref 8.9–10.3)
Chloride: 109 mmol/L (ref 98–111)
Creatinine, Ser: 0.65 mg/dL (ref 0.61–1.24)
GFR, Estimated: >60 mL/min (ref >60)
Glucose, Bld: 95 mg/dL (ref 70–99)
Phosphorus: 2.5 mg/dL (ref 2.5–4.6)
Potassium: 3.3 mmol/L — ABNORMAL LOW (ref 3.5–5.1)
Sodium: 139 mmol/L (ref 135–145)

## 2024-07-17 LAB — CBC
HCT: 35.2 % — ABNORMAL LOW (ref 39.0–52.0)
Hemoglobin: 11.7 g/dL — ABNORMAL LOW (ref 13.0–17.0)
MCH: 32.1 pg (ref 26.0–34.0)
MCHC: 33.2 g/dL (ref 30.0–36.0)
MCV: 96.4 fL (ref 80.0–100.0)
Platelets: 145 K/uL — ABNORMAL LOW (ref 150–400)
RBC: 3.65 MIL/uL — ABNORMAL LOW (ref 4.22–5.81)
RDW: 11.8 % (ref 11.5–15.5)
WBC: 7.3 K/uL (ref 4.0–10.5)
nRBC: 0 % (ref 0.0–0.2)

## 2024-07-17 MED ORDER — VITAMIN B-12 1000 MCG PO TABS
1000.0000 ug | ORAL_TABLET | Freq: Every day | ORAL | Status: DC
  Administered 2024-07-17 – 2024-07-18 (×2): 1000 ug via ORAL
  Filled 2024-07-17 (×2): qty 1

## 2024-07-17 MED ORDER — MAGNESIUM SULFATE 4 GM/100ML IV SOLN
4.0000 g | Freq: Once | INTRAVENOUS | Status: AC
  Administered 2024-07-17: 4 g via INTRAVENOUS
  Filled 2024-07-17: qty 100

## 2024-07-17 MED ORDER — CYANOCOBALAMIN 1000 MCG PO TABS: 1000.0000 ug | ORAL_TABLET | Freq: Every day | ORAL | Status: AC

## 2024-07-17 MED ORDER — POTASSIUM CHLORIDE 20 MEQ PO PACK
40.0000 meq | PACK | Freq: Once | ORAL | Status: AC
  Administered 2024-07-17: 40 meq via ORAL
  Filled 2024-07-17: qty 2

## 2024-07-17 MED ORDER — CHLORDIAZEPOXIDE HCL 25 MG PO CAPS
25.0000 mg | ORAL_CAPSULE | Freq: Four times a day (QID) | ORAL | Status: DC
  Administered 2024-07-17 – 2024-07-18 (×5): 25 mg via ORAL
  Filled 2024-07-17 (×5): qty 1

## 2024-07-17 NOTE — Discharge Summary (Addendum)
 Physician Discharge Summary   Patient: Lance Green MRN: [id number] DOB: Feb 04, 1976  Admit date:     07/16/2024  Discharge date: 07/17/24  Discharge Physician: AIDA CHO   PCP: [facility]   Recommendations at discharge:   Follow-up with Dr. 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 *  Hospital Course:  Lance Green is a 48 y.o. male with medical history significant for C3-C6 ACDF 2023, hypertension, history of pancreatitis, history of peptic ulcer disease, tobacco use disorder, alcohol use disorder, history of alcohol withdrawal seizure in July 2024, hospitalized in March 2025 for right upper lobe cavitary/necrotizing pneumonia (sputum culture showed Klebsiella pneumoniae), history of anemia requiring blood transfusion in the past, who presented to the hospital with numbness in the hands and feet that has been going on for about 3 years.  However, he noticed increasing numbness and weakness which was associated with multiple falls (about 6 times) in the last 24 hours preceding admission.  He had difficulty ambulating because he his legs gave way.  He did not have any neck pain and he did not have any trauma to any part of his body.  He has alcohol use disorder and has been drinking since he was about 48 years old because he has significant family history of alcohol use disorder as well.  He drinks about 60 ounces of beer every day.  In the ED, vital signs were unremarkable.  Labs significant for bicarbonate of 14, anion gap 16, BUN 7, creatinine 0.76.  Ethyl alcohol was 242.  MRI cervical spine  without contrast IMPRESSION: 1. Progressive severe canal stenosis at C6-C7. 2. Severe foraminal stenosis at multiple levels, as  detailed above. 3. Interval ACDF at C3-C6 with improved canal stenosis at these levels. 4. 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   Assessment and Plan:   Alcohol use disorder with alcohol intoxication and alcoholic ketoacidosis: He was treated with IV fluids and Librium . Counseled to quit drinking alcohol.   Progressive severe canal stenosis at C6-C7, cervical myelopathy: He was evaluated by Dr. Katrina, neurosurgeon.  Outpatient follow-up was recommended.   Numbness in hands and feet: Differential diagnosis include cervical myelopathy or peripheral neuropathy from alcohol use disorder.     General weakness and multiple falls: Probably multifactorial including peripheral neuropathy, cervical myelopathy but likely related to alcohol intoxication.  PT and OT recommended home health therapy.   Hypomagnesemia: Magnesium  was down to 1.1.  He was treated with IV magnesium  sulfate.  Improved to 2.2. Hypokalemia: Potassium was 3.3.  This was repleted with oral potassium chloride .   His condition has improved and he is deemed stable for discharge to home today.      Consultants: Neurosurgeon Procedures performed: None Disposition: Home health Diet recommendation:  Regular diet DISCHARGE MEDICATION: Allergies as of 07/17/2024   No Known Allergies      Medication List     TAKE these medications    albuterol  108 (90 Base) MCG/ACT inhaler Commonly known as: VENTOLIN  HFA Inhale 2 puffs into the lungs every 6 (six) hours as needed for wheezing or shortness of breath.   cyanocobalamin 1000 MCG tablet Take  1 tablet (1,000 mcg total) by mouth daily. Start taking on: July 18, 2024   ferrous sulfate  325 (65 FE) MG EC tablet Take 1 tablet (325 mg total) by mouth 2 (two) times daily.   pantoprazole  40 MG tablet Commonly known as:  PROTONIX  Take 1 tablet (40 mg total) by mouth 2 (two) times daily.   vitamin C  250 MG tablet Commonly known as: ASCORBIC ACID  Take 1 tablet (250 mg total) by mouth daily.        Follow-up Information     Clois Fret, MD. Schedule an appointment as soon as possible for a visit in 2 week(s).   Specialty: Neurosurgery Contact information: [address] [place] [address] [phone]                Discharge Exam: [name]   07/16/24 0550  Weight: 65 kg   GEN: NAD SKIN: Warm and dry EYES: No pallor or icterus ENT: MMM CV: RRR PULM: CTA B ABD: soft, ND, NT, +BS CNS: AAO x 3, non focal EXT: No edema or tenderness   Condition at discharge: good  The results of significant diagnostics from this hospitalization (including imaging, microbiology, ancillary and laboratory) are listed below for reference.   Imaging Studies: MR Cervical Spine Wo Contrast Result Date: 07/16/2024 EXAM: MRI CERVICAL SPINE WITHOUT CONTRAST 07/16/2024 03:27:23 AM TECHNIQUE: Multiplanar multisequence MRI of the cervical spine was performed. COMPARISON: MRI Cervical spine March 28, 2022 CLINICAL HISTORY: Myelopathy, acute, cervical spine. Pt arrives via ems c/o bilateral leg/feet and hand pain due to neuropathy. Pt reports falling 6 times today due his legs giving out. FINDINGS: BONES AND ALIGNMENT: Normal alignment. Normal vertebral body heights. No acute fracture. C3-C6 ACDF. SPINAL CORD: C4-C5 Cord T2 hyperintensity and cord atrophy. SOFT TISSUES: No paraspinal mass. C2-C3: No significant disc herniation. No spinal canal stenosis or neural foraminal narrowing. C3-C4: ACDF. Bilateral facet and uncovertebral protrusion with severe bilateral foraminal stenosis. Mild to moderate canal stenosis. C4-C5: ACDF. Bilateral facet and uncovertebral protrusion with severe bilateral foraminal stenosis. Mild to moderate canal stenosis. C5-C6: Bilateral facet and uncovertebral  protrusion with severe left and moderate right foraminal stenosis. No significant canal stenosis C6-C7: Posterior disc osteophyte complex and ligamentum flavum thickening with severe canal stenosis, progressed from the prior. Left greater than right facet hypertrophy with moderate to severe left and moderate right foraminal stenosis. C7-T1: No significant disc herniation. No spinal canal stenosis or neural foraminal narrowing. IMPRESSION: 1. Progressive severe canal stenosis at C6-C7. 2. Severe foraminal stenosis at multiple levels, as  detailed above. 3. Interval ACDF at C3-C6 with improved canal stenosis at these levels. 4. 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 Electronically signed by: Gilmore Molt MD 07/16/2024 03:48 AM EDT RP Workstation: [id number]   MR BRAIN WO CONTRAST Result Date: 07/16/2024 EXAM: MR Brain without Intravenous Contrast. CLINICAL HISTORY: Ataxia, head trauma; Neuro deficit, acute, stroke suspected. Pt arrives via ems c/o bilateral leg/feet and hand pain due to neuropathy. Pt reports falling 6 times today due his legs giving out. TECHNIQUE: Magnetic resonance images of the brain without intravenous contrast in multiple planes. CONTRAST: Without. COMPARISON: MRI head March 28, 2022 FINDINGS: BRAIN: No restricted diffusion to indicate acute infarction. No intracranial mass or hemorrhage. No midline shift or extra-axial fluid collection. 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 The central arterial and venous flow voids are patent. VENTRICLES: No hydrocephalus. ORBITS: The orbits are normal. SINUSES AND MASTOIDS: Maxillary sinus mucosal thickening. BONES:  No acute fracture or focal osseous lesion. IMPRESSION: 1. No evidence of acute abnormality. Electronically signed by: Gilmore Molt MD 07/16/2024 03:38 AM EDT RP Workstation: [id number]   DG Chest Port 1 View Result Date: 06/18/2024 CLINICAL DATA:  Cough and congestion for several months EXAM:  PORTABLE CHEST 1 VIEW COMPARISON:  12/16/2023 FINDINGS: Cardiac shadow is within normal limits. Previously seen cavitary lesion in the right apex is again identified although the thickened wall component and fluid within has resolved. No inflammatory component is seen. No new infiltrate is noted. No bony abnormality is seen. IMPRESSION: No acute abnormality noted. Persistent cavitary area in the right upper lobe all below the thickened wall and fluid within has resolved. Electronically Signed   By: Oneil Devonshire M.D.   On: 06/18/2024 22:03    Microbiology: Results for orders placed or performed during the hospital encounter of 12/16/23  Culture, blood (Routine x 2)     Status: None   Collection Time: 12/16/23 10:17 PM   Specimen: BLOOD  Result Value Ref Green Status   Specimen Description BLOOD RIGHT WRIST  Final   Special Requests   Final    BOTTLES DRAWN AEROBIC AND ANAEROBIC Blood Culture results may not be optimal due to an inadequate volume of blood received in culture bottles   Culture   Final    NO GROWTH 5 DAYS Performed at [facility], [address] [place], [place] [address]    Report Status 12/21/2023 FINAL  Final  Culture, blood (Routine x 2)     Status: None   Collection Time: 12/16/23 10:17 PM   Specimen: BLOOD  Result Value Ref Green Status   Specimen Description BLOOD RIGHT HAND  Final   Special Requests   Final    BOTTLES DRAWN AEROBIC AND ANAEROBIC Blood Culture results may not be optimal due to an inadequate volume of blood received in culture bottles   Culture   Final    NO GROWTH 5 DAYS Performed at [facility], [address] [place], [place] [address]    Report Status 12/21/2023 FINAL  Final  Resp panel by RT-PCR (RSV, Flu A&B, Covid) Anterior Nasal Swab     Status: None   Collection Time: 12/16/23 10:19 PM   Specimen: Anterior Nasal Swab  Result Value Ref Green Status   SARS Coronavirus 2 by RT PCR NEGATIVE NEGATIVE Final    Comment:  (NOTE) SARS-CoV-2 target nucleic acids are NOT DETECTED.  The SARS-CoV-2 RNA is generally detectable in upper respiratory specimens during the acute phase of infection. The lowest concentration of SARS-CoV-2 viral copies this assay can detect is 138 copies/mL. A negative result does not preclude SARS-Cov-2 infection and should not be used as the sole basis for treatment or other patient management decisions. A negative result may occur with  improper specimen collection/handling, submission of specimen other than nasopharyngeal swab, presence of viral mutation(s) within the areas targeted by this assay, and inadequate number of viral copies(<138 copies/mL). A negative result must be combined with clinical observations, patient history, and epidemiological information. The expected result is Negative.  Fact Sheet for Patients:  BloggerCourse.com  Fact Sheet for Healthcare Providers:  SeriousBroker.it  This test is no t yet approved or cleared by the [place]  FDA and  has been authorized for detection and/or diagnosis of SARS-CoV-2 by FDA under an Emergency Use Authorization (EUA). This EUA will remain  in effect (meaning this test can be used) for the duration of the COVID-19 declaration  under Section 564(b)(1) of the Act, 21 U.S.C.section 360bbb-3(b)(1), unless the authorization is terminated  or revoked sooner.       Influenza A by PCR NEGATIVE NEGATIVE Final   Influenza B by PCR NEGATIVE NEGATIVE Final    Comment: (NOTE) The Xpert Xpress SARS-CoV-2/FLU/RSV plus assay is intended as an aid in the diagnosis of influenza from Nasopharyngeal swab specimens and should not be used as a sole basis for treatment. Nasal washings and aspirates are unacceptable for Xpert Xpress SARS-CoV-2/FLU/RSV testing.  Fact Sheet for Patients: BloggerCourse.com  Fact Sheet for Healthcare  Providers: SeriousBroker.it  This test is not yet approved or cleared by the [place]  FDA and has been authorized for detection and/or diagnosis of SARS-CoV-2 by FDA under an Emergency Use Authorization (EUA). This EUA will remain in effect (meaning this test can be used) for the duration of the COVID-19 declaration under Section 564(b)(1) of the Act, 21 U.S.C. section 360bbb-3(b)(1), unless the authorization is terminated or revoked.     Resp Syncytial Virus by PCR NEGATIVE NEGATIVE Final    Comment: (NOTE) Fact Sheet for Patients: BloggerCourse.com  Fact Sheet for Healthcare Providers: SeriousBroker.it  This test is not yet approved or cleared by the [place]  FDA and has been authorized for detection and/or diagnosis of SARS-CoV-2 by FDA under an Emergency Use Authorization (EUA). This EUA will remain in effect (meaning this test can be used) for the duration of the COVID-19 declaration under Section 564(b)(1) of the Act, 21 U.S.C. section 360bbb-3(b)(1), unless the authorization is terminated or revoked.  Performed at [facility], [address] [place], [place] [address]   MTB-RIF NAA with AFB Culture, sputum (q8 x 3)     Status: None   Collection Time: 12/17/23  4:28 AM   Specimen: Sputum  Result Value Ref Green Status   Myco tuberculosis Complex NOT DETECTED NOT DETECTED Final   Rifampin Not applicable NOT DETECTED Final   AFB Specimen Processing Concentration  Final   Acid Fast Culture Negative  Final    Comment: (NOTE) No acid fast bacilli isolated after 6 weeks. Performed At: [facility] [address] [place], [place] [id number] Jennette Shorter MD Ey:[id number]    Source (MTB RIF) SPUTUM  Final    Comment: Performed at [facility], [address] [place], [place] [address]  MTB-RIF NAA with AFB Culture, sputum (q8 x 3)     Status: None    Collection Time: 12/17/23  2:54 PM   Specimen: Sputum  Result Value Ref Green Status   Myco tuberculosis Complex NOT DETECTED NOT DETECTED Final   Rifampin Not applicable NOT DETECTED Final   AFB Specimen Processing Concentration  Final   Acid Fast Culture Negative  Final    Comment: (NOTE) No acid fast bacilli isolated after 6 weeks. Performed At: [facility] [address] [place], [place] [id number] Jennette Shorter MD Ey:[id number]    Source (MTB RIF) SPUTUM  Final    Comment: Performed at [facility], [address] [place], [place] [address]  MTB-RIF NAA with AFB Culture, sputum (q8 x 3)     Status: None   Collection Time: 12/17/23  8:30 PM   Specimen: Sputum  Result Value Ref Green Status   Myco tuberculosis Complex NOT DETECTED NOT DETECTED Final   Rifampin Not applicable NOT DETECTED Final   AFB Specimen Processing Concentration  Final   Acid Fast Culture Negative  Final    Comment: (NOTE) No acid fast bacilli  isolated after 6 weeks. Performed At: [facility] [address] [place], [place] [id number] Jennette Shorter MD Ey:[id number]    Source (MTB RIF) SPU  Final    Comment: Performed at [facility], [address] [place] [place], [place] [address]  MRSA Next Gen by PCR, Nasal     Status: None   Collection Time: 12/17/23  8:30 PM   Specimen: Sputum; Nasal Swab  Result Value Ref Green Status   MRSA by PCR Next Gen NOT DETECTED NOT DETECTED Final    Comment: (NOTE) The GeneXpert MRSA Assay (FDA approved for NASAL specimens only), is one component of a comprehensive MRSA colonization surveillance program. It is not intended to diagnose MRSA infection nor to guide or monitor treatment for MRSA infections. Test performance is not FDA approved in patients less than 66 years old. Performed at [facility], [address] [place], [place] [address]   Aspergillus Ag, BAL/Serum     Status: Abnormal   Collection Time: 12/17/23   8:30 PM   Specimen: Vein  Result Value Ref Green Status   Aspergillus Ag, BAL/Serum 5.66 (H) 0.00 - 0.49 Index Final    Comment: (NOTE) Performed At: [facility] [address] [place], [place] [id number] Jennette Shorter MD Ey:[id number]   Expectorated Sputum Assessment w Gram Stain, Rflx to Resp Cult     Status: None   Collection Time: 12/17/23  8:30 PM   Specimen: Sputum  Result Value Ref Green Status   Specimen Description SPU EXPECTORATED SPUTUM  Final   Special Requests NONE  Final   Sputum evaluation   Final    THIS SPECIMEN IS ACCEPTABLE FOR SPUTUM CULTURE Performed at [facility], [address] [place], [place] [address]    Report Status 12/17/2023 FINAL  Final  Acid Fast Smear (AFB)     Status: None   Collection Time: 12/17/23  8:30 PM   Specimen: Sputum  Result Value Ref Green Status   AFB Specimen Processing Concentration  Final   Acid Fast Smear Negative  Final    Comment: (NOTE) Performed At: [facility] [address] [place], [place] [id number] Jennette Shorter MD Ey:[id number]    Source (AFB) SPUTUM  Final    Comment: Performed at [facility], [address] [place], [place] [address]  Acid Fast Culture with reflexed sensitivities     Status: None   Collection Time: 12/17/23  8:30 PM   Specimen: Sputum  Result Value Ref Green Status   Acid Fast Culture Negative  Final    Comment: (NOTE) No acid fast bacilli isolated after 6 weeks. Performed At: [facility] [address] [place], [place] [id number] Jennette Shorter MD Ey:[id number]    Source of Sample SPUTUM  Final    Comment: Performed at [facility], [address] [place], [place] [address]  Culture, Respiratory w Gram Stain     Status: None   Collection Time: 12/17/23  8:30 PM   Specimen: Sputum  Result Value Ref Green Status   Specimen Description   Final    SPU EXPECTORATED SPUTUM Performed at [facility], [address] [place], [place] [address]    Special Requests   Final    NONE Reflexed from [id number] Performed at [facility], [address] [place], [place] [address]    Gram Stain   Final    MODERATE WBC PRESENT,BOTH PMN AND MONONUCLEAR FEW GRAM POSITIVE COCCI IN PAIRS RARE GRAM NEGATIVE RODS Performed  at [facility], [address] [place], [place] [address]    Culture FEW KLEBSIELLA PNEUMONIAE  Final   Report Status 12/20/2023 FINAL  Final   Organism ID, Bacteria KLEBSIELLA PNEUMONIAE  Final      Susceptibility   Klebsiella pneumoniae - MIC*    AMPICILLIN  RESISTANT Resistant     CEFEPIME <=0.12 SENSITIVE Sensitive     CEFTAZIDIME <=1 SENSITIVE Sensitive     CEFTRIAXONE  <=0.25 SENSITIVE Sensitive     CIPROFLOXACIN  <=0.25 SENSITIVE Sensitive     GENTAMICIN  <=1 SENSITIVE Sensitive     IMIPENEM <=0.25 SENSITIVE Sensitive     TRIMETH /SULFA  <=20 SENSITIVE Sensitive     AMPICILLIN /SULBACTAM 4 SENSITIVE Sensitive     PIP/TAZO <=4 SENSITIVE Sensitive ug/mL    * FEW KLEBSIELLA PNEUMONIAE    Labs: CBC: Recent Labs  Lab 07/16/24 0158 07/17/24 0714  WBC 5.9 7.3  HGB 14.1 11.7*  HCT 41.2 35.2*  MCV 95.2 96.4  PLT 150 145*   Basic Metabolic Panel: Recent Labs  Lab 07/16/24 0158 07/16/24 1317 07/17/24 0714 07/17/24 1521  NA 138 144 139  --   K 3.9 3.6 3.3*  --   CL 108 113* 109  --   CO2 14* 20* 24  --   GLUCOSE 85 113* 95  --   BUN 7 <5* 5*  --   CREATININE 0.76 0.69 0.65  --   CALCIUM 8.6* 8.5* 8.6*  --   MG 1.9  --  1.1* 2.2  PHOS  --   --  2.5  --    Liver Function Tests: Recent Labs  Lab 07/16/24 0158 07/16/24 1317 07/17/24 0714  AST 39 37  --   ALT 16 15  --   ALKPHOS 82 72  --   BILITOT 0.4 0.6  --   PROT 8.0 7.0  --   ALBUMIN 4.0 3.5 3.1*   CBG: No results for input(s): GLUCAP in the last 168 hours.  Discharge time spent: greater than 30 minutes.  Signed: AIDA CHO, MD [facility] 07/17/2024

## 2024-07-17 NOTE — Evaluation (Signed)
 Physical Therapy Evaluation Patient Details Name: Lance Green MRN: [id number] DOB: [birth year] Today's Date: 07/17/2024  History of Present Illness  Lance Green is a pleasant 48 y.o. male with medical history significant for alcohol use disorder, HTN, chronic peripheral neuropathy with history of C3-C6 ACDF 2023 who came into ED complaining of progressively worsening numbness in [name] and feet that has been ongoing for 3 years. Neurosurgery is consulted.  Clinical Impression  Patient seen for initial PT evaluation due to decline in functional status. He endorses frequent falls in the last 6 months with multiple falls the day prior to admission. Patient is A&O x 4. Baseline mobility reported as independent , currently requiring supervision/CGA for ambulation with RW. Gait assessed with RW able to walk 200 with mild unsteadiness in gait, limited by apprehension in LE support. Pt has support from family and friends PRN. Pt lives with his mother in a 1 level duplex with ~2-3 STE.  Clinical impression: patient presents with mild to moderate mobility limitations secondary to progressive myelopathic symptoms. Recommend skilled PT to address safety, mobility, and discharge planning.          If plan is discharge home, recommend the following: A little help with walking and/or transfers;A little help with bathing/dressing/bathroom   Can travel by private vehicle        Equipment Recommendations Rolling walker (2 wheels)  Recommendations for Other Services       Functional Status Assessment Patient has had a recent decline in their functional status and/or demonstrates limited ability to make significant improvements in function in a reasonable and predictable amount of time     Precautions / Restrictions Precautions Precautions: Fall Recall of Precautions/Restrictions: Intact Restrictions Weight Bearing Restrictions Per Provider Order: No      Mobility  Bed Mobility Overal bed  mobility: Needs Assistance Bed Mobility: Supine to Sit, Sit to Supine     Supine to sit: Modified independent (Device/Increase time), HOB elevated, Used rails Sit to supine: Modified independent (Device/Increase time), +2 for safety/equipment, HOB elevated        Transfers Overall transfer level: Needs assistance Equipment used: Rolling walker (2 wheels) Transfers: Sit to/from Stand Sit to Stand: Supervision           General transfer comment: cueing for safe use of RW    Ambulation/Gait Ambulation/Gait assistance: Contact guard assist (for safety due to recent history of increased falls and progressive myelopathy) Gait Distance (Feet): 200 Feet Assistive device: Standard walker Gait Pattern/deviations: Step-through pattern, Shuffle, Knee hyperextension - right, Knees buckling       General Gait Details: vs for decreased gait speed  Stairs            Wheelchair Mobility     Tilt Bed    Modified Rankin (Stroke Patients Only)       Balance Overall balance assessment: Needs assistance Sitting-balance support: Feet supported, No upper extremity supported Sitting balance-Leahy Scale: Good     Standing balance support: Reliant on assistive device for balance, During functional activity, Single extremity supported Standing balance-Leahy Scale: Fair                               Pertinent Vitals/Pain Pain Assessment Pain Assessment: No/denies pain    Home Living Family/patient expects to be discharged to:: Private residence Living Arrangements: Parent Available Help at Discharge: Family;Available 24 hours/day Type of Home: Apartment (duplex) Home Access: Stairs to enter  Entrance Stairs-Rails: None Entrance Stairs-Number of Steps: 2   Home Layout: One level Home Equipment: None      Prior Function Prior Level of Function : Independent/Modified Independent             Mobility Comments: Pt denies use of AE PTA, endorses multiple  falls in last 6 months ADLs Comments: Per pt, he is generally independent with ADL/IADL management. His family does assist with IADL management including cooking 2/2 baseline Lance Green.     Extremity/Trunk Assessment                Communication   Communication Communication: No apparent difficulties    Cognition Arousal: Alert Behavior During Therapy: WFL for tasks assessed/performed                             Following commands: Intact       Cueing Cueing Techniques: Verbal cues     General Comments      Exercises Other Exercises Other Exercises: Pt educated on role of PT in acute setting, safety, falls prevention strategies, and DC recs.   Assessment/Plan    PT Assessment Patient needs continued PT services  PT Problem List Decreased strength;Decreased activity tolerance;Decreased mobility       PT Treatment Interventions DME instruction;Gait training;Functional mobility training;Therapeutic activities;Therapeutic exercise;Neuromuscular re-education    PT Goals (Current goals can be found in the Care Plan section)  Acute Rehab PT Goals Patient Stated Goal: pt wants to go home and beforehand get better PT Goal Formulation: With patient Time For Goal Achievement: 07/31/24 Potential to Achieve Goals: Good    Frequency Min 1X/week     Co-evaluation               AM-PAC PT 6 Clicks Mobility  Outcome Measure Help needed turning from your back to your side while in a flat bed without using bedrails?: None Help needed moving from lying on your back to sitting on the side of a flat bed without using bedrails?: None Help needed moving to and from a bed to a chair (including a wheelchair)?: A Little Help needed standing up from a chair using your arms (e.g., wheelchair or bedside chair)?: A Little Help needed to walk in hospital room?: A Little Help needed climbing 3-5 steps with a railing? : A Lot 6 Click Score:  19    End of Session Equipment Utilized During Treatment: Gait belt Activity Tolerance: Patient tolerated treatment well Patient left: in chair;with call bell/phone within reach Nurse Communication: Mobility status PT Visit Diagnosis: Other abnormalities of gait and mobility (R26.89);Unsteadiness on feet (R26.81);Muscle weakness (generalized) (M62.81)    Time: 9080-9070 PT Time Calculation (min) (ACUTE ONLY): 10 min   Charges:   PT Evaluation $PT Eval Low Complexity: 1 Low   PT General Charges $$ ACUTE PT VISIT: 1 Visit         Lance Green DPT, PT    Candelario Steppe A Donaldo Teegarden 07/17/2024, 11:19 AM

## 2024-07-17 NOTE — Progress Notes (Incomplete)
 {  Select_TRH_Note:[id number]}

## 2024-07-17 NOTE — TOC Initial Note (Signed)
 Transition of Care [facility]) - Initial/Assessment Note    Patient Details  Name: Lance Green MRN: [id number] Date of Birth: January 12, 1976  Transition of Care [facility]) CM/SW Contact:    Victory Jackquline RAMAN, RN Phone Number: 07/17/2024, 6:02 PM  Clinical Narrative:      RNCM, spoke with the patient at the bedside. I introduced myself, my role, and explained that discharge planning recommendations would be discussed. PT recommended Home with Home Health/PT. Patient is in agreement with HH/PT. Unable to notified [facility] about the patient's need of RW, they are closed. I left a list of HH agencies at his bedside to choose from for HH/PT. Patient is unable to go home tonight. He will call to now to arrange transportation to go home tomorrow. RNCM will continue to follow for discharge planning/care coordination and update as applicable.               Expected Discharge Plan: Home w Home Health Services Barriers to Discharge: Continued Medical Work up   Patient Goals and CMS Choice            Expected Discharge Plan and Services       Living arrangements for the past 2 months: Apartment Expected Discharge Date: 07/17/24                                    Prior Living Arrangements/Services Living arrangements for the past 2 months: Apartment Lives with:: Parents Patient language and need for interpreter reviewed:: Yes Do you feel safe going back to the place where you live?: Yes      Need for Family Participation in Patient Care: Yes (Comment) Care giver support system in place?: Yes (comment)   Criminal Activity/Legal Involvement Pertinent to Current Situation/Hospitalization: No - Comment as needed  Activities of Daily Living   ADL Screening (condition at time of admission) Independently performs ADLs?: Yes (appropriate for developmental age) Is the patient deaf or have difficulty hearing?: No Does the patient have difficulty seeing, even when wearing glasses/contacts?:  No Does the patient have difficulty concentrating, remembering, or making decisions?: No  Permission Sought/Granted                  Emotional Assessment Appearance:: Appears stated age, Well-Groomed Attitude/Demeanor/Rapport: Self-Confident, Engaged, Gracious Affect (typically observed): Quiet, Pleasant Orientation: : Oriented to Self, Oriented to Place, Oriented to  Time Alcohol / Substance Use: Not Applicable Psych Involvement: No (comment)  Admission diagnosis:  Alcoholic ketoacidosis [E87.29] Cervical spinal stenosis [M48.02] 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] Frequent falls [R29.6] Alcohol use disorder [F10.90] Patient Active Problem List   Diagnosis Date Noted   Frequent falls 07/16/2024   Cervical spinal stenosis 07/16/2024   Cervical spinal stenosis due to adjacent segment 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 07/16/2024   Anemia 12/18/2023   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, 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 12/17/2023   Tobacco use disorder 12/17/2023   Alcohol use disorder 12/17/2023   Neuropathy 04/25/2023   Hepatic steatosis 04/25/2023   Seizure due to alcohol withdrawal (HCC) 04/24/2023   Thrombocytopenia 04/24/2023   ETOH abuse 04/23/2023   Transaminitis 04/23/2023   Tobacco abuse counseling 04/23/2023   Dysphagia 04/05/2022   Hypokalemia 04/01/2022   Rhabdomyolysis 04/01/2022   Fall at home, initial encounter 04/01/2022   Cervical cord compression with myelopathy (HCC) 03/28/2022   Cavitary pneumonia 02/20/2021   Sepsis (HCC) 02/20/2021   Normocytic anemia 02/20/2021   Essential  hypertension    Alcohol abuse 11/21/2019   Hypomagnesemia 11/21/2019   Abscess of left middle finger 11/10/2018   Acute appendicitis with localized peritonitis 07/06/2018   PCP:  [facility] Pharmacy:   [facility] [address]), [place] - [address] [address]) [place] [address] Phone: [phone] Fax:  [phone]     Social Drivers of Health (SDOH) Social History: SDOH Screenings   Food Insecurity: No Food Insecurity (07/16/2024)  Housing: High Risk (07/16/2024)  Transportation Needs: No Transportation Needs (07/16/2024)  Utilities: Not At Risk (07/16/2024)  Financial Resource Strain: Medium Risk (04/01/2024)   [facility] from [facility]  Social Connections: Unknown (07/16/2024)  Tobacco Use: High Risk (07/16/2024)   SDOH Interventions:     Readmission Risk Interventions     No data to display

## 2024-07-18 DIAGNOSIS — R296 Repeated falls: Secondary | ICD-10-CM | POA: Diagnosis not present

## 2024-07-18 NOTE — Progress Notes (Signed)
 Mobility Specialist - Progress Note   07/18/24 0900  Mobility  Activity Ambulated with assistance  Level of Assistance Independent after set-up  Assistive Device Front wheel walker  Distance Ambulated (ft) 200 ft  Range of Motion/Exercises All extremities  Activity Response Tolerated well  Mobility visit 1 Mobility  Mobility Specialist Start Time (ACUTE ONLY) 0848  Mobility Specialist Stop Time (ACUTE ONLY) 0902  Mobility Specialist Time Calculation (min) (ACUTE ONLY) 14 min   Pt was supine in bed with HOB elevated. Pt agreed to mobility. Pt was able to EOB independently using bed features. Pt was able to STS independently with 2 WW. Pt ambulated well with 2 [employer]. Pt did not need recovery break. After activity pt returned to the room. Pt returned to bed in chair position with needs in reach.  Clem Rodes Mobility Specialist 07/18/24, 9:32 AM

## 2024-07-18 NOTE — Plan of Care (Signed)

## 2024-07-18 NOTE — Progress Notes (Signed)
     [facility] REFERRAL        Occupational Therapy * Physical Therapy * Speech Therapy                           DATE  PATIENT NAME   PATIENT MRN        DIAGNOSIS/DIAGNOSIS CODE   DATE OF DISCHARGE:        PRIMARY CARE PHYSICIAN      PCP PHONE/FAX      Dear Provider (Name: [facility] outpatient __  Fax: [phone]    I certify that I have examined this patient and that occupational/physical/speech therapy is necessary on an outpatient basis.    The patient has expressed interest in completing their recommended course of therapy at your  location.  Once a formal order from the patient's primary care physician has been obtained, please  contact him/her to schedule an appointment for evaluation at your earliest convenience.   [x ]  Physical Therapy Evaluate and Treat  [ x ]  Occupational Therapy Evaluate and Treat  [  ]  Speech Therapy Evaluate and Treat         The patient's primary care physician (listed above) must furnish and be responsible for a formal order such that the recommended services may be furnished while under the primary physician's care, and that the plan of care will be established and reviewed every 30 days (or more often if condition necessitates).

## 2024-07-18 NOTE — Discharge Summary (Signed)
 Physician Discharge Summary   Patient: Lance Green MRN: [id number] DOB: [birth year]  Admit date:     07/16/2024  Discharge date: {dischdate:26783}  Discharge Physician: AIDA CHO   PCP: [facility]   Recommendations at discharge:  {Tip this will not be part of the note when signed- Example include specific recommendations for outpatient follow-up, pending tests to follow-up on. 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 [facility] Course: No notes on file  Assessment and Plan: No notes have been filed under this hospital service. Service: Hospitalist     {Tip this will not be part of the note when signed Body mass index is 22.44 kg/m. , ,  (Optional):26781}  {(NOTE) Pain control PDMP Statment (Optional):26782} Consultants: *** Procedures performed: ***  Disposition: {Plan; Disposition:26390} Diet recommendation:  Discharge Diet Orders (From admission, onward)     Start     Ordered   07/17/24 0000  Diet - low sodium heart healthy        07/17/24 1608           {Diet_Plan:26776} DISCHARGE MEDICATION: Allergies as of 07/18/2024   No Known Allergies      Medication List     TAKE these medications    albuterol  108 (90 Base) MCG/ACT inhaler Commonly known as: VENTOLIN  HFA Inhale 2 puffs into the lungs every 6 (six) hours as needed for wheezing or shortness of breath.   cyanocobalamin 1000 MCG tablet Take 1 tablet (1,000 mcg total) by mouth daily.   ferrous sulfate  325 (65 FE) MG EC tablet Take 1 tablet (325 mg total) by mouth 2 (two) times daily.   pantoprazole  40 MG tablet Commonly known as: PROTONIX  Take 1 tablet (40 mg  total) by mouth 2 (two) times daily.   vitamin C  250 MG tablet Commonly known as: ASCORBIC ACID  Take 1 tablet (250 mg total) by mouth daily.               Durable Medical Equipment  (From admission, onward)           Start     Ordered   07/18/24 0000  For home use only DME Walker rolling       Question Answer Comment  Walker: With 5 Inch Wheels   Patient needs a walker to treat with the following condition General weakness      07/18/24 0745            Follow-up Information     Clois Fret, MD. Schedule an appointment as soon as possible for a visit in 2 week(s).   Specialty: Neurosurgery Contact information: [address] [place] [address] [phone]                Discharge Exam: [name]   07/16/24 0550  Weight: 65 kg   ***  Condition at discharge: {DC Condition:26389}  The results of significant diagnostics from this hospitalization (including imaging, microbiology, ancillary and laboratory) are listed below for reference.   Imaging Studies: CT CERVICAL SPINE WO CONTRAST Result Date: 07/18/2024 EXAM: CT CERVICAL SPINE WITHOUT CONTRAST 07/17/2024 12:00:22 PM TECHNIQUE: CT of the cervical spine was performed without the administration of intravenous contrast.  Multiplanar reformatted images are provided for review. Automated exposure control, iterative reconstruction, and/or weight based adjustment of the mA/kV was utilized to reduce the radiation dose to as low as reasonably achievable. COMPARISON: Cervical spine CT 03/26/2022. Cervical spine MRI 07/16/2024. CLINICAL HISTORY: 48 year old male with acute neck pain and history of prior cervical surgery. FINDINGS: CERVICAL SPINE: BONES AND ALIGNMENT: Normal background bone mineralization. No acute fracture or traumatic malalignment. Chronic straightening of cervical lordosis. Cervical ACDF hardware in place from the C3 to the C6 levels. 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 ACDF hardware with no convincing hardware loosening, interbody calcification or ossification at each level. No convincing arthrodesis at C3-C4 or C4-C5. Evidence of solid anterior endplate flowing osteophytes at C5-C6. Bulky adjacent segment disease at C6-C7, better demonstrated by MRI. SOFT TISSUES: No prevertebral soft tissue swelling. Negative cervicomedullary junction and visible noncontrast parenchyma. Elongated bilateral sialoid ligament calcification. Negative visible noncontrast neck soft tissues otherwise. IMPRESSION: 1. Cervical ACDF from C3 to C6. Convincing arthrodesis only at [facility]. 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 2. Adjacent segment disease at C6C7, better demonstrated by MRI. 3. No acute abnormality of the cervical spine. Electronically signed by: Helayne Hurst MD 07/18/2024 04:05 AM EDT RP Workstation: [id number]   MR Cervical Spine Wo Contrast Result Date: 07/16/2024 EXAM: MRI CERVICAL SPINE WITHOUT CONTRAST 07/16/2024 03:27:23 AM TECHNIQUE: Multiplanar multisequence MRI of the cervical spine was performed. COMPARISON: MRI Cervical spine March 28, 2022 CLINICAL HISTORY: Myelopathy, acute, cervical spine. Pt arrives via ems c/o bilateral leg/feet and hand pain due to neuropathy. Pt reports falling 6 times today due his legs giving out. FINDINGS: BONES AND ALIGNMENT: Normal alignment. Normal vertebral body heights. No acute fracture. C3-C6 ACDF. SPINAL CORD: C4-C5 Cord T2 hyperintensity and cord atrophy. SOFT TISSUES: No paraspinal mass. C2-C3: No significant disc herniation. No spinal canal stenosis or neural foraminal narrowing. C3-C4: ACDF. Bilateral facet and uncovertebral protrusion with severe bilateral foraminal stenosis. Mild to moderate canal stenosis. C4-C5: ACDF. Bilateral facet and uncovertebral protrusion with severe bilateral foraminal stenosis.  Mild to moderate canal stenosis. C5-C6: Bilateral facet and uncovertebral protrusion with severe left and moderate right foraminal stenosis. No significant canal stenosis C6-C7: Posterior disc osteophyte complex and ligamentum flavum thickening with severe canal stenosis, progressed from the prior. Left greater than right facet hypertrophy with moderate to severe left and moderate right foraminal stenosis. C7-T1: No significant disc herniation. No spinal canal stenosis or neural foraminal narrowing. IMPRESSION: 1. Progressive severe canal stenosis at C6-C7. 2. Severe foraminal stenosis at multiple levels, as  detailed above. 3. Interval ACDF at C3-C6 with improved canal stenosis at these levels. 4. 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 Electronically signed by: Gilmore Molt MD 07/16/2024 03:48 AM EDT RP Workstation: [id number]   MR BRAIN WO CONTRAST Result Date: 07/16/2024 EXAM: MR Brain without Intravenous Contrast. CLINICAL HISTORY: Ataxia, head trauma; Neuro deficit, acute, stroke suspected. Pt arrives via ems c/o bilateral leg/feet and hand pain due to neuropathy. Pt reports falling 6 times today due his legs giving out. TECHNIQUE: Magnetic resonance images of the brain without intravenous contrast in multiple planes. CONTRAST: Without. COMPARISON: MRI head March 28, 2022 FINDINGS: BRAIN: No restricted diffusion to indicate acute infarction. No intracranial mass or hemorrhage. No midline shift or extra-axial fluid collection. 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 The central arterial and venous flow voids are patent. VENTRICLES: No hydrocephalus. ORBITS: The orbits  are normal. SINUSES AND MASTOIDS: Maxillary sinus mucosal thickening. BONES: No acute fracture or focal osseous lesion. IMPRESSION: 1. No evidence of acute abnormality. Electronically signed by: Gilmore Molt MD 07/16/2024 03:38 AM EDT RP Workstation: [id number]   DG Chest Port 1 View Result Date:  06/18/2024 CLINICAL DATA:  Cough and congestion for several months EXAM: PORTABLE CHEST 1 VIEW COMPARISON:  12/16/2023 FINDINGS: Cardiac shadow is within normal limits. Previously seen cavitary lesion in the right apex is again identified although the thickened wall component and fluid within has resolved. No inflammatory component is seen. No new infiltrate is noted. No bony abnormality is seen. IMPRESSION: No acute abnormality noted. Persistent cavitary area in the right upper lobe all below the thickened wall and fluid within has resolved. Electronically Signed   By: Oneil Devonshire M.D.   On: 06/18/2024 22:03    Microbiology: Results for orders placed or performed during the hospital encounter of 12/16/23  Culture, blood (Routine x 2)     Status: None   Collection Time: 12/16/23 10:17 PM   Specimen: BLOOD  Result Value Ref Range Status   Specimen Description BLOOD RIGHT WRIST  Final   Special Requests   Final    BOTTLES DRAWN AEROBIC AND ANAEROBIC Blood Culture results may not be optimal due to an inadequate volume of blood received in culture bottles   Culture   Final    NO GROWTH 5 DAYS Performed at [facility], [address] [place], [place] [address]    Report Status 12/21/2023 FINAL  Final  Culture, blood (Routine x 2)     Status: None   Collection Time: 12/16/23 10:17 PM   Specimen: BLOOD  Result Value Ref Range Status   Specimen Description BLOOD RIGHT HAND  Final   Special Requests   Final    BOTTLES DRAWN AEROBIC AND ANAEROBIC Blood Culture results may not be optimal due to an inadequate volume of blood received in culture bottles   Culture   Final    NO GROWTH 5 DAYS Performed at [facility], [address] [place], [place] [address]    Report Status 12/21/2023 FINAL  Final  Resp panel by RT-PCR (RSV, Flu A&B, Covid) Anterior Nasal Swab     Status: None   Collection Time: 12/16/23 10:19 PM   Specimen: Anterior Nasal Swab  Result Value Ref Range  Status   SARS Coronavirus 2 by RT PCR NEGATIVE NEGATIVE Final    Comment: (NOTE) SARS-CoV-2 target nucleic acids are NOT DETECTED.  The SARS-CoV-2 RNA is generally detectable in upper respiratory specimens during the acute phase of infection. The lowest concentration of SARS-CoV-2 viral copies this assay can detect is 138 copies/mL. A negative result does not preclude SARS-Cov-2 infection and should not be used as the sole basis for treatment or other patient management decisions. A negative result may occur with  improper specimen collection/handling, submission of specimen other than nasopharyngeal swab, presence of viral mutation(s) within the areas targeted by this assay, and inadequate number of viral copies(<138 copies/mL). A negative result must be combined with clinical observations, patient history, and epidemiological information. The expected result is Negative.  Fact Sheet for Patients:  BloggerCourse.com  Fact Sheet for Healthcare Providers:  SeriousBroker.it  This test is no t yet approved or cleared by the [place]  FDA and  has been authorized for detection and/or diagnosis of SARS-CoV-2 by FDA under an Emergency Use Authorization (EUA). This EUA will remain  in effect (meaning this test  can be used) for the duration of the COVID-19 declaration under Section 564(b)(1) of the Act, 21 U.S.C.section 360bbb-3(b)(1), unless the authorization is terminated  or revoked sooner.       Influenza A by PCR NEGATIVE NEGATIVE Final   Influenza B by PCR NEGATIVE NEGATIVE Final    Comment: (NOTE) The Xpert Xpress SARS-CoV-2/FLU/RSV plus assay is intended as an aid in the diagnosis of influenza from Nasopharyngeal swab specimens and should not be used as a sole basis for treatment. Nasal washings and aspirates are unacceptable for Xpert Xpress SARS-CoV-2/FLU/RSV testing.  Fact Sheet for  Patients: BloggerCourse.com  Fact Sheet for Healthcare Providers: SeriousBroker.it  This test is not yet approved or cleared by the [place]  FDA and has been authorized for detection and/or diagnosis of SARS-CoV-2 by FDA under an Emergency Use Authorization (EUA). This EUA will remain in effect (meaning this test can be used) for the duration of the COVID-19 declaration under Section 564(b)(1) of the Act, 21 U.S.C. section 360bbb-3(b)(1), unless the authorization is terminated or revoked.     Resp Syncytial Virus by PCR NEGATIVE NEGATIVE Final    Comment: (NOTE) Fact Sheet for Patients: BloggerCourse.com  Fact Sheet for Healthcare Providers: SeriousBroker.it  This test is not yet approved or cleared by the [place]  FDA and has been authorized for detection and/or diagnosis of SARS-CoV-2 by FDA under an Emergency Use Authorization (EUA). This EUA will remain in effect (meaning this test can be used) for the duration of the COVID-19 declaration under Section 564(b)(1) of the Act, 21 U.S.C. section 360bbb-3(b)(1), unless the authorization is terminated or revoked.  Performed at [facility], [address] [place], [place] [address]   MTB-RIF NAA with AFB Culture, sputum (q8 x 3)     Status: None   Collection Time: 12/17/23  4:28 AM   Specimen: Sputum  Result Value Ref Range Status   Myco tuberculosis Complex NOT DETECTED NOT DETECTED Final   Rifampin Not applicable NOT DETECTED Final   AFB Specimen Processing Concentration  Final   Acid Fast Culture Negative  Final    Comment: (NOTE) No acid fast bacilli isolated after 6 weeks. Performed At: [facility] [address] [place], [place] [id number] Jennette Shorter MD Ey:[id number]    Source (MTB RIF) SPUTUM  Final    Comment: Performed at [facility], [address] [place], [place]  [address]  MTB-RIF NAA with AFB Culture, sputum (q8 x 3)     Status: None   Collection Time: 12/17/23  2:54 PM   Specimen: Sputum  Result Value Ref Range Status   Myco tuberculosis Complex NOT DETECTED NOT DETECTED Final   Rifampin Not applicable NOT DETECTED Final   AFB Specimen Processing Concentration  Final   Acid Fast Culture Negative  Final    Comment: (NOTE) No acid fast bacilli isolated after 6 weeks. Performed At: [facility] [address] [place], [place] [id number] Jennette Shorter MD Ey:[id number]    Source (MTB RIF) SPUTUM  Final    Comment: Performed at [facility], [address] [place], [place] [address]  MTB-RIF NAA with AFB Culture, sputum (q8 x 3)     Status: None   Collection Time: 12/17/23  8:30 PM   Specimen: Sputum  Result Value Ref Range Status   Myco tuberculosis Complex NOT DETECTED NOT DETECTED Final   Rifampin Not applicable NOT DETECTED Final   AFB Specimen Processing Concentration  Final   Acid Fast Culture Negative  Final    Comment: (NOTE) No acid fast bacilli isolated after 6 weeks. Performed At: [facility] [address] [place], [place] [id number] Jennette Shorter MD Ey:[id number]    Source (MTB RIF) SPU  Final    Comment: Performed at [facility], [address] [place] [place], [place] [address]  MRSA Next Gen by PCR, Nasal     Status: None   Collection Time: 12/17/23  8:30 PM   Specimen: Sputum; Nasal Swab  Result Value Ref Range Status   MRSA by PCR Next Gen NOT DETECTED NOT DETECTED Final    Comment: (NOTE) The GeneXpert MRSA Assay (FDA approved for NASAL specimens only), is one component of a comprehensive MRSA colonization surveillance program. It is not intended to diagnose MRSA infection nor to guide or monitor treatment for MRSA infections. Test performance is not FDA approved in patients less than 46 years old. Performed at [facility], [address] [place], [place] [address]    Aspergillus Ag, BAL/Serum     Status: Abnormal   Collection Time: 12/17/23  8:30 PM   Specimen: Vein  Result Value Ref Range Status   Aspergillus Ag, BAL/Serum 5.66 (H) 0.00 - 0.49 Index Final    Comment: (NOTE) Performed At: [facility] [address] [place], [place] [id number] Jennette Shorter MD Ey:[id number]   Expectorated Sputum Assessment w Gram Stain, Rflx to Resp Cult     Status: None   Collection Time: 12/17/23  8:30 PM   Specimen: Sputum  Result Value Ref Range Status   Specimen Description SPU EXPECTORATED SPUTUM  Final   Special Requests NONE  Final   Sputum evaluation   Final    THIS SPECIMEN IS ACCEPTABLE FOR SPUTUM CULTURE Performed at [facility], [address] [place], [place] [address]    Report Status 12/17/2023 FINAL  Final  Acid Fast Smear (AFB)     Status: None   Collection Time: 12/17/23  8:30 PM   Specimen: Sputum  Result Value Ref Range Status   AFB Specimen Processing Concentration  Final   Acid Fast Smear Negative  Final    Comment: (NOTE) Performed At: [facility] [address] [place], [place] [id number] Jennette Shorter MD Ey:[id number]    Source (AFB) SPUTUM  Final    Comment: Performed at [facility], [address] [place], [place] [address]  Acid Fast Culture with reflexed sensitivities     Status: None   Collection Time: 12/17/23  8:30 PM   Specimen: Sputum  Result Value Ref Range Status   Acid Fast Culture Negative  Final    Comment: (NOTE) No acid fast bacilli isolated after 6 weeks. Performed At: [facility] [address] [place], [place] [id number] Jennette Shorter MD Ey:[id number]    Source of Sample SPUTUM  Final    Comment: Performed at [facility], [address] [place], [place] [address]  Culture, Respiratory w Gram Stain     Status: None   Collection Time: 12/17/23  8:30 PM   Specimen: Sputum  Result Value Ref Range Status   Specimen Description   Final     SPU EXPECTORATED SPUTUM Performed at [facility], [address] [place], [place] [address]    Special Requests   Final    NONE Reflexed from [id number] Performed at [facility], [address] [place], [place] [address]    Gram Stain   Final    MODERATE WBC PRESENT,BOTH PMN AND MONONUCLEAR FEW  GRAM POSITIVE COCCI IN PAIRS RARE GRAM NEGATIVE RODS Performed at [facility], [address], [place], [place] [address]    Culture FEW KLEBSIELLA PNEUMONIAE  Final   Report Status 12/20/2023 FINAL  Final   Organism ID, Bacteria KLEBSIELLA PNEUMONIAE  Final      Susceptibility   Klebsiella pneumoniae - MIC*    AMPICILLIN  RESISTANT Resistant     CEFEPIME <=0.12 SENSITIVE Sensitive     CEFTAZIDIME <=1 SENSITIVE Sensitive     CEFTRIAXONE  <=0.25 SENSITIVE Sensitive     CIPROFLOXACIN  <=0.25 SENSITIVE Sensitive     GENTAMICIN  <=1 SENSITIVE Sensitive     IMIPENEM <=0.25 SENSITIVE Sensitive     TRIMETH /SULFA  <=20 SENSITIVE Sensitive     AMPICILLIN /SULBACTAM 4 SENSITIVE Sensitive     PIP/TAZO <=4 SENSITIVE Sensitive ug/mL    * FEW KLEBSIELLA PNEUMONIAE    Labs: CBC: Recent Labs  Lab 07/16/24 0158 07/17/24 0714  WBC 5.9 7.3  HGB 14.1 11.7*  HCT 41.2 35.2*  MCV 95.2 96.4  PLT 150 145*   Basic Metabolic Panel: Recent Labs  Lab 07/16/24 0158 07/16/24 1317 07/17/24 0714 07/17/24 1521  NA 138 144 139  --   K 3.9 3.6 3.3*  --   CL 108 113* 109  --   CO2 14* 20* 24  --   GLUCOSE 85 113* 95  --   BUN 7 <5* 5*  --   CREATININE 0.76 0.69 0.65  --   CALCIUM 8.6* 8.5* 8.6*  --   MG 1.9  --  1.1* 2.2  PHOS  --   --  2.5  --    Liver Function Tests: Recent Labs  Lab 07/16/24 0158 07/16/24 1317 07/17/24 0714  AST 39 37  --   ALT 16 15  --   ALKPHOS 82 72  --   BILITOT 0.4 0.6  --   PROT 8.0 7.0  --   ALBUMIN 4.0 3.5 3.1*   CBG: No results for input(s): GLUCAP in the last 168 hours.  Discharge time spent: {LESS THAN/GREATER UYJW:73611} 30  minutes.  Signed: AIDA CHO, MD [facility] 07/18/2024

## 2024-07-18 NOTE — Discharge Instructions (Signed)
 Rent/Utility/Housing  Agency Name: [facility] Agency Address: [address] [place], [place] [address] Phone: [phone] Email: [email or web address] Website: [email or web address].org Service(s) Offered: Housing services, self-sufficiency, congregate meal program, weatherization program, Field seismologist program, emergency food assistance,  housing counseling, home ownership program, wheels -towork program.  Agency Name: [facility] Address: [address] [place], [place] [address] Phone: [phone] (8a-4p) [phone] (8p- 10p) Email: [email or web address] Website: [email or web address] Service(s) Offered: A program for homeless and/or needy men that includes one-on-one counseling, life skills training and job rehabilitation.  Agency Name: [facility] [place] Address: [address] [place], [place] [address] Phone: [phone] Website: www.alliedchurches.org Service(s) Offered: Assistance to needy in emergency with utility bills, heating fuel, and prescriptions. Shelter for homeless 7pm-7am. January 29, 2017 15  Agency Name: [facility] [place] [facility] Address: [address], [place], [place] [address] Phone: [phone] Contact Person: Cathleen Corti Email: [email or web address] Website: [email or web address].[email or web address] Service(s) Offered: Helps individuals with developmental disabilities move from housing that is more restrictive to homes where they  can achieve greater independence and have more  opportunities.  Agency Name: [facility] Address: [address], [place], [place] [address] Phone: [phone] Email: [email or web address] Website: www.[email or web address].org Service(s) Offered: Provides affordable housing for low-income families, elderly, and disabled individuals. Offer a wide range of  programs and services, from financial planning to  afterschool and summer programs.  Agency Name: [facility] [employer] [facility] Address: [address] [place], [place] [address] Phone: [phone] Service(s) Offered: Child support services; child welfare services; food stamps; Medicaid; work first family assistance; and aid with fuel,  rent, food and medicine.  Agency Name: [facility] [place] [employer]. Address: [facility] [address], [place], [place]  [address] Phone: [phone] Website: www.familyabuseservices.org Service(s) Offered: 24 hour Crisis Line: [phone]; 24 hour Emergency Shelter; Transitional Housing; Support Groups; Scientist, physiological; [employer]; Hispanic Outreach: [phone];  Visitation Center: [phone].  Agency Name: [facility] [place]. Address: [address] [place], [place] [address] Phone: [phone] Service(s) Offered: CAP Services; Home and [employer]; Individual or Group Supports; Respite Care Non-Institutional Nursing;  Residential Supports; Respite Care and Personal Care Services; Transportation; Family and Friends Night; Recreational Activities; Three Nutritious Meals/Snacks; Consultation with Registered Dietician; Twenty-four hour Registered Nurse Access; Daily and [employer]; Camp Green Leaves; [place] for the [employer] (During Summer Months) Bingo Night (Every  Wednesday Night); Special Populations Dance Night  (Every Tuesday Night); Professional Hair Care Services.  Agency Name: [facility] Address: [address], [place], [place] [address] Phone: [phone] Contact Person: Jabier Mutton Website: [email or web address].Physicist, medical) Offered: Residential treatment facility for women; food and  clothing, educational & employment development and  transportation to work; Counsellor of financial skills;  parenting and family reunification; emotional and spiritual  support;  transitional housing for program graduates.  Agency Name: [facility] Address: [address] [place], [place] [address] Phone: [phone] Email: [email or web address] Website: TaskTown.es Service(s) Offered: Public housing units for elderly, disabled, and low income people; housing choice vouchers for income eligible  applicants; shelter plus care vouchers; and Psychologist, clinical.  Agency Name: [facility] [employer] Address: [address] [place], [place] [address] Phone: [phone] Email: [email or web address] Website: www.habitatalamance.org Service(s) Offered: Build houses for families in need of decent housing. Each adult in the family must invest 200 hours of labor on  someone else's house, work with volunteers to build their own house, attend classes  on budgeting, home maintenance, yard care, and attend homeowner association meetings.  Agency Name: [facility] Address: [address], [place], [place] [address] Phone: [phone] Website: www.rsli.org Service(s) Offered: Intermediate care facilities for intellectually delayed, Supervised Living in group homes for adults with developmental disabilities, Supervised Living for people who have dual diagnoses (MRMI), Independent Living, Supported Living, respite and a variety of CAP services, pre-vocational services, day supports, and [employer].  Agency Name: [place] [employer] Phone: [phone] Website: www.NCForeclosurePrevention.gov Service(s) Offered: Zero-interest, deferred loans to homeowners struggling to pay their mortgage. Call for more information.

## 2024-07-18 NOTE — TOC Progression Note (Addendum)
 [facility] - Progression Note    Patient Details  Name: Lance Green MRN: [id number] Date of Birth: 1976/08/22  Transition [facility]) CM/SW Contact  Corean ONEIDA Haddock, RN Phone Number: 07/18/2024, 8:56 AM  Clinical Narrative:      Referral made for RW made to [facility] with [facility]  Referral sent out in HUB for [facility] referral Spoke with [employer] rep Kyra, and she is not aware of any HH agencies in the area that will accept the insurance plan  Update:  no accepting [facility] agencies in [place] with patient about outpatient therapy.  Patient in agreement, and no preference of facility. Sent to [facility] as it is closer to his home  Patient states she will be using [facility] at discharge    Update:  Per Mitch with [employer] patient received RW in 2023.  Not eligible for another under insurance.  Patient declines to private pay  Expected Discharge Plan: Home w Home Health Services Barriers to Discharge: Continued Medical Work up               Expected Discharge Plan and Services       Living arrangements for the past 2 months: Apartment Expected Discharge Date: 07/17/24                                     Social Drivers of Health (SDOH) Interventions SDOH Screenings   Food Insecurity: No Food Insecurity (07/16/2024)  Housing: High Risk (07/16/2024)  Transportation Needs: No Transportation Needs (07/16/2024)  Utilities: Not At Risk (07/16/2024)  Financial Resource Strain: Medium Risk (04/01/2024)   Received from [facility]  Social Connections: Unknown (07/16/2024)  Tobacco Use: High Risk (07/16/2024)    Readmission Risk Interventions     No data to display

## 2024-07-22 NOTE — Progress Notes (Signed)
 Referring Physician:  Roxanna Rocks, PA [address] [place],  [place] [address]  Primary Physician:  Roxanna Rocks, [place]  History of Present Illness: 08/02/2024 [name] is here today with a chief complaint of balance issues.  He is also previously had surgery in 2023.  That was done for similar symptoms.  He was recently in the hospital due to worsening issues with his balance.   07/16/2024 [name] is here with a chief complaint of falls.  He is known to me from ACDF in 2023 for progressive myelopathy.   Yesterday he was out with his friend at the park and had 5-6 falls. He was drinking at the time.  He came to the hospital for evaluation due to his falls.     Overall he is much improved compared to prior to his surgery in 2023, but does think his balance is slightly worse.   Past Surgery: ACDF C3-6 in 03/2022 by me   Lance Green Ada has symptoms of cervical myelopathy.   The symptoms are causing a significant impact on the patient's life.    I have utilized the care everywhere function in epic to review the outside records available from external health systems.   Review of Systems:  A 10 point review of systems is negative, except for the pertinent positives and negatives detailed in the HPI.  Past Medical History: Past Medical History:  Diagnosis Date   Alcohol abuse    HTN (hypertension)    Neuropathy    Pancreatitis    Peptic ulcer disease     Past Surgical History: Past Surgical History:  Procedure Laterality Date   ANTERIOR CERVICAL DECOMP/DISCECTOMY FUSION N/A 04/02/2022   Procedure: C3-6 ANTERIOR CERVICAL DECOMPRESSION/DISCECTOMY FUSION 3 LEVELS;  Surgeon: Clois Fret, MD;  Location: [facility] ORS;  Service: Neurosurgery;  Laterality: N/A;   APPENDECTOMY     INCISION AND DRAINAGE Left 11/11/2018   Procedure: INCISION AND DRAINAGE;  Surgeon: Mardee Lynwood SQUIBB, MD;  Location: [facility] ORS;  Service: Orthopedics;  Laterality: Left;    LAPAROSCOPIC APPENDECTOMY N/A 07/06/2018   Procedure: APPENDECTOMY LAPAROSCOPIC;  Surgeon: Rodolph Romano, MD;  Location: [facility] ORS;  Service: General;  Laterality: N/A;    Allergies: Allergies as of 08/02/2024   (No Known Allergies)    Medications:  Current Outpatient Medications:    albuterol  (VENTOLIN  HFA) 108 (90 Base) MCG/ACT inhaler, Inhale 2 puffs into the lungs every 6 (six) hours as needed for wheezing or shortness of breath., Disp: 8 g, Rfl: 2   cyanocobalamin 1000 MCG tablet, Take 1 tablet (1,000 mcg total) by mouth daily., Disp: , Rfl:    ferrous sulfate  325 (65 FE) MG EC tablet, Take 1 tablet (325 mg total) by mouth 2 (two) times daily., Disp: 60 tablet, Rfl: 0   fluticasone (FLONASE) 50 MCG/ACT nasal spray, Place 2 sprays into the nose., Disp: , Rfl:    pantoprazole  (PROTONIX ) 40 MG tablet, Take 1 tablet (40 mg total) by mouth 2 (two) times daily., Disp: 60 tablet, Rfl: 1   vitamin C  (ASCORBIC ACID ) 250 MG tablet, Take 1 tablet (250 mg total) by mouth daily., Disp: 30 tablet, Rfl: 0  Social History: Social History   Tobacco Use   Smoking status: Every Day    Current packs/day: 0.50    Types: Cigarettes   Smokeless tobacco: Never  Vaping Use   Vaping status: Never Used  Substance Use Topics   Alcohol use: Yes    Comment: daily 40  oz, reports 4 40oz/day   Drug use: No    Family Medical History: Family History  Problem Relation Age of Onset   Cataracts Mother     Physical Examination: Vitals:   08/02/24 1033  BP: 130/82    General: Patient is in no apparent distress. Attention to examination is appropriate.  Neck:   Supple.  Full range of motion.  Respiratory: Patient is breathing without any difficulty.   NEUROLOGICAL:     Awake, alert, oriented to person, place, and time.  Speech is clear and fluent.   Cranial Nerves: Pupils equal round and reactive to light.  Facial tone is symmetric.  Facial sensation is symmetric. Shoulder shrug is  symmetric. Tongue protrusion is midline.  There is no pronator drift.  Strength: Side Biceps Triceps Deltoid Interossei Grip Wrist Ext. Wrist Flex.  R 5 5 5 5  4+ 5 5  L 5 5 5 5  4+ 5 5    Side Iliopsoas Quads Hamstring PF DF EHL  R 5 5 5 5 5 5   L 5 5 5 5 5 5     Reflexes are 3+ and symmetric at the biceps, triceps, brachioradialis, patella and achilles.   Hoffman's is present.    Bilateral upper and lower extremity sensation is symmetric and diminished to light touch.    No evidence of dysmetria noted.   Gait is abnormal and wide-based.     Medical Decision Making  Imaging: MRI C spine 07/16/2024 IMPRESSION: 1. Progressive severe canal stenosis at C6-C7. 2. Severe foraminal stenosis at multiple levels, as  detailed above. 3. Interval ACDF at C3-C6 with improved canal stenosis at these levels. 4. 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   Electronically signed by: Gilmore Molt MD 07/16/2024 03:48 AM EDT RP Workstation: [id number]  I have personally reviewed the images and agree with the above interpretation.  Assessment and Plan: [name] is a pleasant 48 y.o. 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  He also has unclear healing of his fusion at C5-6 consistent with pseudoarthrosis.  He has worsening symptoms and increasing symptoms of myelopathy.  There is no role for conservative management for severe stenosis causing cervical myelopathy.  I recommend surgical intervention.  I recommended C5-7 posterior instrumentation and fusion with C6-7 decompression.  I discussed the planned procedure at length with the patient, including the risks, benefits, alternatives, and indications. The risks discussed include but are not limited to bleeding, infection, need for reoperation, spinal fluid leak, stroke, vision loss, anesthetic complication, coma, paralysis, and even death. I also described in detail that improvement  was not guaranteed.  The patient expressed understanding of these risks, and asked that we proceed with surgery. I described the surgery in layman's terms, and gave ample opportunity for questions, which were answered to the best of my ability.   I spent a total of 30 minutes in this patient's care today. This time was spent reviewing pertinent records including imaging studies, obtaining and confirming history, performing a directed evaluation, formulating and discussing my recommendations, and documenting the visit within the medical record.      Thank you for involving me in the care of this patient.      Teresa Nicodemus K. Clois MD, [facility] Neurosurgery

## 2024-07-23 ENCOUNTER — Other Ambulatory Visit: Payer: Self-pay

## 2024-07-23 ENCOUNTER — Emergency Department: Payer: MEDICAID

## 2024-07-23 ENCOUNTER — Emergency Department: Admission: EM | Admit: 2024-07-23 | Discharge: 2024-07-24 | Disposition: A | Payer: MEDICAID

## 2024-07-23 DIAGNOSIS — Y92009 Unspecified place in unspecified non-institutional (private) residence as the place of occurrence of the external cause: Secondary | ICD-10-CM | POA: Diagnosis not present

## 2024-07-23 DIAGNOSIS — Z79899 Other long term (current) drug therapy: Secondary | ICD-10-CM | POA: Diagnosis not present

## 2024-07-23 DIAGNOSIS — I10 Essential (primary) hypertension: Secondary | ICD-10-CM | POA: Insufficient documentation

## 2024-07-23 DIAGNOSIS — M79605 Pain in left leg: Secondary | ICD-10-CM | POA: Diagnosis not present

## 2024-07-23 DIAGNOSIS — S0990XA Unspecified injury of head, initial encounter: Secondary | ICD-10-CM | POA: Diagnosis present

## 2024-07-23 DIAGNOSIS — W19XXXA Unspecified fall, initial encounter: Secondary | ICD-10-CM | POA: Diagnosis not present

## 2024-07-23 DIAGNOSIS — Y907 Blood alcohol level of 200-239 mg/100 ml: Secondary | ICD-10-CM | POA: Diagnosis not present

## 2024-07-23 DIAGNOSIS — F10920 Alcohol use, unspecified with intoxication, uncomplicated: Secondary | ICD-10-CM | POA: Diagnosis not present

## 2024-07-23 DIAGNOSIS — M79604 Pain in right leg: Secondary | ICD-10-CM | POA: Diagnosis not present

## 2024-07-23 MED ORDER — ADULT MULTIVITAMIN W/MINERALS CH: 1.0000 | ORAL_TABLET | Freq: Every day | ORAL | Status: DC

## 2024-07-23 MED ORDER — FOLIC ACID 1 MG PO TABS: 1.0000 mg | ORAL_TABLET | Freq: Every day | ORAL | Status: DC

## 2024-07-23 MED ORDER — THIAMINE HCL 100 MG/ML IJ SOLN: 100.0000 mg | Freq: Every day | INTRAMUSCULAR | Status: DC

## 2024-07-23 MED ORDER — LORAZEPAM 1 MG PO TABS: 1.0000 mg | ORAL_TABLET | ORAL | Status: DC | PRN

## 2024-07-23 MED ORDER — THIAMINE MONONITRATE 100 MG PO TABS: 100.0000 mg | ORAL_TABLET | Freq: Every day | ORAL | Status: DC

## 2024-07-23 MED ORDER — LORAZEPAM 2 MG/ML IJ SOLN: 1.0000 mg | INTRAMUSCULAR | Status: DC | PRN

## 2024-07-23 NOTE — ED Provider Notes (Signed)
 [facility] Provider Note    Event Date/Time   First MD Initiated Contact with Patient 07/23/24 2340     (approximate)   History   Fall and Alcohol Intoxication   HPI  Lance Green is a 48 y.o. male  pmh medical history significant for C3-C6 ACDF 2023, hypertension, history of pancreatitis, history of peptic ulcer disease, tobacco use disorder, alcohol use disorder, history of alcohol withdrawal seizure in July 2024, hospitalized in March 2025 for right upper lobe cavitary/necrotizing pneumonia (sputum culture showed Klebsiella pneumoniae), history of anemia requiring blood transfusion, and recent hospitalization discharged on 07/18/24 (6 days ago) for severe cervical stenosis (evaluated by [facility] neurosurgeon and cleared for outpatient management) who presents from his mother's home with alcohol intoxication ongoing pain in his lower extremities and fall.  Patient reports that his neuropathy improves while he is in the hospital.  Denies any loss of consciousness.  Denies any SI HI or AVHs      Physical Exam   Triage Vital Signs: ED Triage Vitals  Encounter Vitals Group     BP 07/23/24 2259 113/80     Girls Systolic BP Percentile --      Girls Diastolic BP Percentile --      Boys Systolic BP Percentile --      Boys Diastolic BP Percentile --      Pulse Rate 07/23/24 2259 79     Resp 07/23/24 2259 18     Temp 07/23/24 2259 (!) 97.5 F (36.4 C)     Temp Source 07/23/24 2259 Oral     SpO2 07/23/24 2255 96 %     Weight 07/23/24 2254 145 lb (65.8 kg)     Height 07/23/24 2254 5' 6 (1.676 m)     Head Circumference --      Peak Flow --      Pain Score 07/23/24 2259 0     Pain Loc --      Pain Education --      Exclude from Growth Chart --     Most recent vital signs: Vitals:   07/24/24 0200 07/24/24 0500  BP: 107/77 122/86  Pulse: 69   Resp: 18 19  Temp: 98 F (36.7 C) 98 F (36.7 C)  SpO2: 97% 98%    Nursing Triage Note reviewed.  Vital signs reviewed and patients oxygen saturation is normoxic  General: Patient is well nourished, well developed, awake and alert, resting comfortably in no acute distress, smells of alcohol Head: Normocephalic and atraumatic Eyes: Normal inspection, extraocular muscles intact, no conjunctival pallor Ear, nose, throat: Normal external exam Neck: Normal range of motion Respiratory: Patient is in no respiratory distress, lungs CTAB Cardiovascular: Patient is not tachycardic, RRR without murmur appreciated GI: Abd SNT with no guarding or rebound  Back: Normal inspection of the back with good strength and range of motion throughout all ext Extremities: pulses intact with good cap refills, no LE pitting edema or calf tenderness Neuro: The patient is alert and oriented to person, place, and time, appropriately conversive, with 5/5 bilat UE/LE strength, no gross motor or sensory defects noted. Coordination appears to be adequate. Skin: Warm, dry, and intact Psych: normal mood and affect, no SI or HI  ED Results / Procedures / Treatments   Labs (all labs ordered are listed, but only abnormal results are displayed) Labs Reviewed  BASIC METABOLIC PANEL WITH GFR - Abnormal; Notable for the following components:      Result Value  CO2 19 (*)    Calcium 8.6 (*)    All other components within normal limits  URINE DRUG SCREEN, QUALITATIVE ([facility] ONLY) - Abnormal; Notable for the following components:   Benzodiazepine, Ur Scrn POSITIVE (*)    All other components within normal limits  ETHANOL - Abnormal; Notable for the following components:   Alcohol, Ethyl (B) 239 (*)    All other components within normal limits  CBC WITH DIFFERENTIAL/PLATELET - Abnormal; Notable for the following components:   RBC 3.72 (*)    Hemoglobin 12.0 (*)    HCT 36.5 (*)    All other components within normal limits  MAGNESIUM   CBC WITH DIFFERENTIAL/PLATELET  CBC WITH DIFFERENTIAL/PLATELET      EKG None  RADIOLOGY CT head: No intracranial hemorrhage on my independent review interpretation radiologist agrees CT C-spine: Consistent with prior cervical stenosis but no acute fracture    PROCEDURES:  Critical Care performed: No  Procedures   MEDICATIONS ORDERED IN ED: Medications  LORazepam  (ATIVAN ) tablet 1-4 mg (has no administration in time range)    Or  LORazepam  (ATIVAN ) injection 1-4 mg (has no administration in time range)  thiamine  (VITAMIN B1) tablet 100 mg (has no administration in time range)    Or  thiamine  (VITAMIN B1) injection 100 mg (has no administration in time range)  folic acid  (FOLVITE ) tablet 1 mg (has no administration in time range)  multivitamin with minerals tablet 1 tablet (has no administration in time range)     IMPRESSION / MDM / ASSESSMENT AND PLAN / ED COURSE                                Differential diagnosis includes, but is not limited to, alcohol intoxication, electrolyte derangement anemia, intracranial hemorrhage  ED course: Patient presents and is slurring his words consistent with toxidrome of alcohol.  CT head demonstrated no intracranial hemorrhage.  CT C-spine was unremarkable.  His urine drug screen was positive for benzodiazepine which was not administered by us .  I do think he is possibly complicating his alcohol use with benzos.  He had no profound electrolyte derangements.  He is awaiting clinical stabilization and likely discharge home.  He will need to be signed out to oncoming physician at 7 AM but currently he is resting/sleeping comfortably and I have ordered him a diet   Clinical Course as of 07/24/24 0635  Sun Jul 24, 2024  0105 Benzodiazepine, Ur Scrn(!): POSITIVE Not from us  [HD]  0105 Alcohol, Ethyl (B)(!): 239 Elevated [HD]  0207 CT Head Wo Contrast No acute intracranial abnormality [HD]  0207 CT Cervical Spine Wo Contrast Severe cord compression [HD]    Clinical Course User Index [HD] Lance Rolland BRAVO, MD   -- Risk: 5 This patient has a high risk of morbidity due to further diagnostic testing or treatment. Rationale: This patient's evaluation and management involve a high risk of morbidity due to the potential severity of presenting symptoms, need for diagnostic testing, and/or initiation of treatment that may require close monitoring. The differential includes conditions with potential for significant deterioration or requiring escalation of care. Treatment decisions in the ED, including medication administration, procedural interventions, or disposition planning, reflect this level of risk. COPA: 5 The patient has the following acute or chronic illness/injury that poses a possible threat to life or bodily function: [X] : The patient has a potentially serious acute condition or an acute exacerbation of a  chronic illness requiring urgent evaluation and management in the Emergency Department. The clinical presentation necessitates immediate consideration of life-threatening or function-threatening diagnoses, even if they are ultimately ruled out.   FINAL CLINICAL IMPRESSION(S) / ED DIAGNOSES   Final diagnoses:  Fall in home, initial encounter  Alcoholic intoxication without complication     Rx / DC Orders   ED Discharge Orders     None        Note:  This document was prepared using Dragon voice recognition software and may include unintentional dictation errors.   Lance Rolland BRAVO, MD 07/24/24 [phone]

## 2024-07-23 NOTE — ED Notes (Signed)
 Brought into room via wheelchair. Pants soiled with urine states he needs to urinate again. Assisted with urinal and changed patient into gown. Safety precautions in place, bed alarm on and working. Call light, urinal and tv remote within reach.

## 2024-07-23 NOTE — ED Triage Notes (Signed)
 Pt presented to ED BIBA with c/o frequent falls due to neuropathy. Pt also endorses alcohol use today, states drank 32-40 oz of beer today. States hx of neuropathy x 3 years and has been worse with the colder weather. States was admitted for same last week and neuropathy improves while in the hospital. Denies LOC. Denies pain at this time.

## 2024-07-24 ENCOUNTER — Emergency Department: Payer: MEDICAID

## 2024-07-24 LAB — CBC WITH DIFFERENTIAL/PLATELET
Abs Immature Granulocytes: 0.02 K/uL (ref 0.00–0.07)
Basophils Absolute: 0 K/uL (ref 0.0–0.1)
Basophils Relative: 1 %
Eosinophils Absolute: 0.2 K/uL (ref 0.0–0.5)
Eosinophils Relative: 3 %
HCT: 36.5 % — ABNORMAL LOW (ref 39.0–52.0)
Hemoglobin: 12 g/dL — ABNORMAL LOW (ref 13.0–17.0)
Immature Granulocytes: 0 %
Lymphocytes Relative: 52 %
Lymphs Abs: 3.9 K/uL (ref 0.7–4.0)
MCH: 32.3 pg (ref 26.0–34.0)
MCHC: 32.9 g/dL (ref 30.0–36.0)
MCV: 98.1 fL (ref 80.0–100.0)
Monocytes Absolute: 0.5 K/uL (ref 0.1–1.0)
Monocytes Relative: 7 %
Neutro Abs: 2.7 K/uL (ref 1.7–7.7)
Neutrophils Relative %: 37 %
Platelets: 184 K/uL (ref 150–400)
RBC: 3.72 MIL/uL — ABNORMAL LOW (ref 4.22–5.81)
RDW: 11.8 % (ref 11.5–15.5)
WBC: 7.3 K/uL (ref 4.0–10.5)
nRBC: 0 % (ref 0.0–0.2)

## 2024-07-24 LAB — BASIC METABOLIC PANEL WITH GFR
Anion gap: 15 (ref 5–15)
BUN: 7 mg/dL (ref 6–20)
CO2: 19 mmol/L — ABNORMAL LOW (ref 22–32)
Calcium: 8.6 mg/dL — ABNORMAL LOW (ref 8.9–10.3)
Chloride: 105 mmol/L (ref 98–111)
Creatinine, Ser: 0.74 mg/dL (ref 0.61–1.24)
GFR, Estimated: >60 mL/min (ref >60)
Glucose, Bld: 86 mg/dL (ref 70–99)
Potassium: 3.6 mmol/L (ref 3.5–5.1)
Sodium: 139 mmol/L (ref 135–145)

## 2024-07-24 LAB — URINE DRUG SCREEN, QUALITATIVE (ARMC ONLY)
Amphetamines, Ur Screen: NOT DETECTED
Barbiturates, Ur Screen: NOT DETECTED
Benzodiazepine, Ur Scrn: POSITIVE — AB
Cannabinoid 50 Ng, Ur ~~LOC~~: NOT DETECTED
Cocaine Metabolite,Ur ~~LOC~~: NOT DETECTED
MDMA (Ecstasy)Ur Screen: NOT DETECTED
Methadone Scn, Ur: NOT DETECTED
Opiate, Ur Screen: NOT DETECTED
Phencyclidine (PCP) Ur S: NOT DETECTED
Tricyclic, Ur Screen: NOT DETECTED

## 2024-07-24 LAB — MAGNESIUM: Magnesium: 1.7 mg/dL (ref 1.7–2.4)

## 2024-07-24 LAB — ETHANOL: Alcohol, Ethyl (B): 239 mg/dL — ABNORMAL HIGH (ref <15)

## 2024-07-24 NOTE — ED Provider Notes (Signed)
-----------------------------------------   7:41 AM on 07/24/2024 -----------------------------------------  Blood pressure (!) 144/98, pulse 71, temperature 98.2 F (36.8 C), temperature source Oral, resp. rate 18, height 5' 6 (1.676 m), weight 65.8 kg, SpO2 97%.  Assuming care from Dr. Nicholaus.  In short, Lance Green is a 48 y.o. male with a chief complaint of Fall and Alcohol Intoxication .  Refer to the original H&P for additional details.  The current plan of care is to discharge home once clinically sober.  ----------------------------------------- 8:55 AM on 07/24/2024 ----------------------------------------- Patient awake and alert on reassessment, eating breakfast out difficulty and appears clinically sober.  He is appropriate for discharge home with outpatient follow-up, was encouraged to follow-up with neurosurgery as discussed on prior visit.  He was counseled to return to the ED for new or worsening symptoms, patient agrees with plan.    Willo Dunnings, MD 07/24/24 [phone]

## 2024-07-24 NOTE — ED Notes (Signed)
Pt given breakfast tray, currently sitting up in bed eating.

## 2024-08-02 ENCOUNTER — Other Ambulatory Visit: Payer: Self-pay

## 2024-08-02 ENCOUNTER — Encounter: Payer: Self-pay | Admitting: Neurosurgery

## 2024-08-02 ENCOUNTER — Ambulatory Visit (INDEPENDENT_AMBULATORY_CARE_PROVIDER_SITE_OTHER): Payer: MEDICAID | Admitting: Neurosurgery

## 2024-08-02 VITALS — BP 130/82 | Ht 66.0 in | Wt 124.2 lb

## 2024-08-02 DIAGNOSIS — G959 Disease of spinal cord, unspecified: Secondary | ICD-10-CM

## 2024-08-02 DIAGNOSIS — M4802 Spinal stenosis, cervical region: Secondary | ICD-10-CM | POA: Diagnosis not present

## 2024-08-02 DIAGNOSIS — M50323 Other cervical disc degeneration at C6-C7 level: Secondary | ICD-10-CM | POA: Diagnosis not present

## 2024-08-02 DIAGNOSIS — M5030 Other cervical disc degeneration, unspecified cervical region: Secondary | ICD-10-CM

## 2024-08-02 DIAGNOSIS — M960 Pseudarthrosis after fusion or arthrodesis: Secondary | ICD-10-CM

## 2024-08-02 DIAGNOSIS — Z01818 Encounter for other preprocedural examination: Secondary | ICD-10-CM

## 2024-08-02 NOTE — Patient Instructions (Addendum)
 Please see below for information in regards to your upcoming surgery:  *no elective dental work (such as cleanings) until 3 months after surgery. Any dental work that is not elective will require advance notification so we can prescribe antibiotics for the day before, the day of, and the day after if you are within 3 months after surgery.   Planned surgery: C5-7 posterior spinal fusion, C6-7 posterior spinal decompression   Surgery date: 08/24/24 at [facility]: [address] [place], [place] [address]) - you will find out your arrival time the business day before your surgery.   Pre-op appointment at [facility] Pre-admit Testing: you will receive a call with a date/time for this appointment. If you are scheduled for an in person appointment, Pre-admit Testing is located on the first floor of the [facility] building, [address], [address]. During this appointment, they will advise you which medications you can take the morning of surgery, and which medications you will need to hold for surgery. Labs (such as blood work, EKG) may be done at your pre-op appointment. You are not required to fast for these labs. Should you need to change your pre-op appointment, please call Pre-admit testing at [phone].       Surgical clearance: we will send a clearance form to [employer], PA-C. They may wish to see you in their office prior to signing the clearance form. If so, they may call you to schedule an appointment.     NSAIDS (Non-steroidal anti-inflammatory drugs): because you are having a fusion, please avoid taking any NSAIDS (examples: ibuprofen , motrin , aleve, naproxen, meloxicam, diclofenac) for 3 months after surgery. Celebrex  is an exception and is OK to take, if prescribed. Tylenol  is not an NSAID.    Common restrictions after spine surgery: No bending, lifting, or twisting ("BLT"). Avoid lifting objects heavier than 10 pounds  for the first 6 weeks after surgery. Where possible, avoid household activities that involve lifting, bending, reaching, pushing, or pulling such as laundry, vacuuming, grocery shopping, and childcare. Try to arrange for help from friends and family for these activities while you heal. Do not drive while taking prescription pain medication. Weeks 6 through 12 after surgery: avoid lifting more than 25 pounds.    X-rays after surgery: Because you are having a fusion or arthroplasty: for appointments after your 2 week follow-up: please arrive our office 30 minutes prior to your appointment for x-rays. This applies to every appointment after your 2 week follow-up. Failure to do so may result in your appointment being rescheduled.    How to contact us :  If you have any questions/concerns before or after surgery, you can reach us  at [phone], or you can send a mychart message. We can be reached by phone or mychart 8am-4pm, Monday-Friday.  *Please note: Calls after 4pm are forwarded to a third party answering service. Mychart messages are not routinely monitored during evenings, weekends, and holidays. Please call our office to contact the answering service for urgent concerns during non-business hours.    If you have FMLA/disability paperwork, please drop it off or fax it to [phone]   Appointments/FMLA & disability paperwork: Reche Hait, & Nichole Registered Nurse/Surgery scheduler: Marc Leichter, RN & Katie, RN Certified Medical Assistants: Don, CMA, Elenor, CMA, Damien, CMA, & Auston, [place] Physician Assistants: Lyle Decamp, PA-C, Edsel Goods, PA-C & Glade Boys, PA-C Surgeons: Penne Sharps, MD & Reeves Daisy, MD   [facility] PREADMIT TESTING VISIT and  SURGERY INFORMATION SHEET   Now that surgery has been scheduled you can anticipate several phone calls from [facility] services. A pharmacy technician will call you to verify your current list of  medications taken at home.               The Pre-Service Center will call to verify your insurance information and to give you billing estimates and information.             The Preadmit Testing Office will be calling to schedule a visit to obtain information for the anesthesia team and provide instructions on preparation for surgery.  What can you expect for the Preadmit Testing Visit: Appointments may be scheduled in-person or by telephone.  If a telephone visit is scheduled, you may be asked to come into the office to have lab tests or other studies performed.   This visit will not be completed any greater than 14 days prior to your surgery.  If your surgery has been scheduled for a future date, please do not be alarmed if we have not contacted you to schedule an appointment more than a month prior to the surgery date.    Please be prepared to provide the following information during this appointment:            -Personal medical history                                               -Medication and allergy list            -Any history of problems with anesthesia              -Recent lab work or diagnostic studies            -Please notify us  of any needs we should be aware of to provide the best care possible           -You will be provided with instructions on how to prepare for your surgery.    On The Day of Surgery:  You must have a driver to take you home after surgery, you will be asked not to drive for 24 hours following surgery.  Taxi, Gisele and non-medical transport will not be acceptable means of transportation unless you have a responsible individual who will be traveling with you.  Visitors in the surgical area:   2 people will be able to visit you in your room once your preparation for surgery has been completed. During surgery, your visitors will be asked to wait in the Surgery Waiting Area.  It is not a requirement for them to stay, if they prefer to leave and come back.  Your  visitor(s) will be given an update once the surgery has been completed.  No visitors are allowed in the initial recovery room to respect patient privacy and safety.  Once you are more awake and transfer to the secondary recovery area, or are transferred to an inpatient room, visitors will again be able to see you.  To respect and protect your privacy: We will ask on the day of surgery who your driver will be and what the contact number for that individual will be. We will ask if it is okay to share information with this individual, or if there is an alternative individual that we, or the surgeon, should contact to provide  updates and information. If family or friends come to the surgical information desk requesting information about you, who you have not listed with us , no information will be given.   It may be helpful to designate someone as the main contact who will be responsible for updating your other friends and family.    PREADMIT TESTING OFFICE: [phone] SAME DAY SURGERY: [phone] We look forward to caring for you before and throughout the process of your surgery.

## 2024-08-10 ENCOUNTER — Other Ambulatory Visit: Payer: Self-pay

## 2024-08-10 ENCOUNTER — Encounter
Admission: RE | Admit: 2024-08-10 | Discharge: 2024-08-10 | Disposition: A | Discharge status: Home / Self Care | Payer: MEDICAID | Source: Ambulatory Visit | Attending: Neurosurgery | Admitting: Neurosurgery

## 2024-08-10 DIAGNOSIS — G9520 Unspecified cord compression: Secondary | ICD-10-CM

## 2024-08-10 DIAGNOSIS — Z01810 Encounter for preprocedural cardiovascular examination: Secondary | ICD-10-CM

## 2024-08-10 DIAGNOSIS — I10 Essential (primary) hypertension: Secondary | ICD-10-CM

## 2024-08-10 DIAGNOSIS — Z01812 Encounter for preprocedural laboratory examination: Secondary | ICD-10-CM

## 2024-08-10 HISTORY — DX: Allergic rhinitis, unspecified: J30.9

## 2024-08-10 HISTORY — DX: Chronic obstructive pulmonary disease, unspecified: J44.9

## 2024-08-10 HISTORY — DX: Cardiac murmur, unspecified: R01.1

## 2024-08-10 HISTORY — DX: Anemia, unspecified: D64.9

## 2024-08-10 HISTORY — DX: Personal history of other medical treatment: Z92.89

## 2024-08-10 HISTORY — DX: Dermatitis, unspecified: L30.9

## 2024-08-10 HISTORY — DX: Displaced fracture of fifth metatarsal bone, left foot, initial encounter for closed fracture: S92.352A

## 2024-08-10 HISTORY — DX: Pneumonia, unspecified organism: J18.9

## 2024-08-10 NOTE — Patient Instructions (Addendum)
 Your procedure is scheduled on: 08/24/24 - Wednesday Report to the Registration Desk on the 1st floor of the [facility]. To find out your arrival time, please call [phone] between 1PM - 3PM on: 08/23/24 - Tuesday If your arrival time is 6:00 am, do not arrive before that time as the [facility] entrance doors do not open until 6:00 am.  REMEMBER: Instructions that are not followed completely may result in serious medical risk, up to and including death; or upon the discretion of your surgeon and anesthesiologist your surgery may need to be rescheduled.  Do not eat food after midnight the night before surgery.  No gum chewing or hard candies.  You may however, drink CLEAR liquids up to 2 hours before you are scheduled to arrive for your surgery. Do not drink anything within 2 hours of your scheduled arrival time.  Clear liquids include: - water  - apple juice without pulp - gatorade (not RED colors) - black coffee or tea (Do NOT add milk or creamers to the coffee or tea) Do NOT drink anything that is not on this list.  You may take these medications if needed, Anti-inflammatories (NSAIDS) such as Advil , Aleve, Ibuprofen , Motrin , Naproxen, Naprosyn and Aspirin based products such as Excedrin, Goody's Powder, BC Powder. You may take Tylenol  if needed for pain up until the day of surgery.  NSAIDS (Non-steroidal anti-inflammatory drugs): because you are having a fusion, please avoid taking any NSAIDS (examples: ibuprofen , motrin , aleve, naproxen, meloxicam, diclofenac) for 3 months after surgery. Celebrex  is an exception and is OK to take, if prescribed. Tylenol  is not an NSAID ,You may take Tylenol  if needed for pain up until the day of surgery.  Stop ANY OVER THE COUNTER supplements until after surgery.  ON THE DAY OF SURGERY ONLY TAKE THESE MEDICATIONS WITH SIPS OF WATER:  none  Use inhalers albuterol  (VENTOLIN  HFA) on the day of surgery and bring to the hospital.  No Alcohol  for 24 hours before or after surgery.  No Smoking including e-cigarettes for 24 hours before surgery.  No chewable tobacco products for at least 6 hours before surgery.  No nicotine  patches on the day of surgery.  Do not use any recreational drugs for at least a week (preferably 2 weeks) before your surgery.  Please be advised that the combination of cocaine and anesthesia may have negative outcomes, up to and including death. If you test positive for cocaine, your surgery will be cancelled.  On the morning of surgery brush your teeth with toothpaste and water, you may rinse your mouth with mouthwash if you wish. Do not swallow any toothpaste or mouthwash.  Use CHG Soap or wipes as directed on instruction sheet.  Do not wear jewelry, make-up, hairpins, clips or nail polish.  For welded (permanent) jewelry: bracelets, anklets, waist bands, etc.  Please have this removed prior to surgery.  If it is not removed, there is a chance that hospital personnel will need to cut it off on the day of surgery.  Do not wear lotions, powders, or perfumes.   Do not shave body hair from the neck down 48 hours before surgery.  Contact lenses, hearing aids and dentures may not be worn into surgery.  Do not bring valuables to the hospital. [facility] is not responsible for any missing/lost belongings or valuables.   Notify your doctor if there is any change in your medical condition (cold, fever, infection).  Wear comfortable clothing (specific to your surgery  type) to the hospital.  After surgery, you can help prevent lung complications by doing breathing exercises.  Take deep breaths and cough every 1-2 hours. Your doctor may order a device called an Incentive Spirometer to help you take deep breaths.  If you are being admitted to the hospital overnight, leave your suitcase in the car. After surgery it may be brought to your room.  In case of increased patient census, it may be necessary for you,  the patient, to continue your postoperative care in the Same Day Surgery department.  If you are being discharged the day of surgery, you will not be allowed to drive home. You will need a responsible individual to drive you home and stay with you for 24 hours after surgery.   If you are taking public transportation, you will need to have a responsible individual with you.  Please call the Pre-admissions Testing Dept. at [phone] if you have any questions about these instructions.  Surgery Visitation Policy:  Patients having surgery or a procedure may have two visitors.  Children under the age of 37 must have an adult with them who is not the patient.  Inpatient Visitation:    Visiting hours are 7 a.m. to 8 p.m. Up to four visitors are allowed at one time in a patient room. The visitors may rotate out with other people during the day.  One visitor age 53 or older may stay with the patient overnight and must be in the room by 8 p.m.   Merchandiser, Retail to address health-related social needs:  [email or web address].proor.no   Pre-operative 4 CHG Bath Instructions   You can play a key role in reducing the risk of infection after surgery. Your skin needs to be as free of germs as possible. You can reduce the number of germs on your skin by washing with CHG (chlorhexidine  gluconate) soap before surgery. CHG is an antiseptic soap that kills germs and continues to kill germs even after washing.   DO NOT use if you have an allergy to chlorhexidine /CHG or antibacterial soaps. If your skin becomes reddened or irritated, stop using the CHG and notify one of our RNs at [phone].   Please shower with the CHG soap starting 4 days before surgery using the following schedule: 11/15 - 11/18.    Please keep in mind the following:  DO NOT shave, including legs and underarms, starting the day of your first shower.   You may shave your face at any point before/day of surgery.   Place clean sheets on your bed the day you start using CHG soap. Use a clean washcloth (not used since being washed) for each shower. DO NOT sleep with pets once you start using the CHG.   CHG Shower Instructions:  If you choose to wash your hair and private area, wash first with your normal shampoo/soap.  After you use shampoo/soap, rinse your hair and body thoroughly to remove shampoo/soap residue.  Turn the water OFF and apply about 3 tablespoons (45 ml) of CHG soap to a CLEAN washcloth.  Apply CHG soap ONLY FROM YOUR NECK DOWN TO YOUR TOES (washing for 3-5 minutes)  DO NOT use CHG soap on face, private areas, open wounds, or sores.  Pay special attention to the area where your surgery is being performed.  If you are having back surgery, having someone wash your back for you may be helpful. Wait 2 minutes after CHG soap is applied, then you may rinse off the  CHG soap.  Pat dry with a clean towel  Put on clean clothes/pajamas   If you choose to wear lotion, please use ONLY the CHG-compatible lotions on the back of this paper.     Additional instructions for the day of surgery: DO NOT APPLY any lotions, deodorants, cologne, or perfumes.   Put on clean/comfortable clothes.  Brush your teeth.  Ask your nurse before applying any prescription medications to the skin.      CHG Compatible Lotions   Aveeno Moisturizing lotion  Cetaphil Moisturizing Cream  Cetaphil Moisturizing Lotion  Clairol Herbal Essence Moisturizing Lotion, Dry Skin  Clairol Herbal Essence Moisturizing Lotion, Extra Dry Skin  Clairol Herbal Essence Moisturizing Lotion, Normal Skin  Curel Age Defying Therapeutic Moisturizing Lotion with Alpha Hydroxy  Curel Extreme Care Body Lotion  Curel Soothing Hands Moisturizing Hand Lotion  Curel Therapeutic Moisturizing Cream, Fragrance-Free  Curel Therapeutic Moisturizing Lotion, Fragrance-Free  Curel Therapeutic Moisturizing Lotion, Original Formula  Eucerin Daily  Replenishing Lotion  Eucerin Dry Skin Therapy Plus Alpha Hydroxy Crme  Eucerin Dry Skin Therapy Plus Alpha Hydroxy Lotion  Eucerin Original Crme  Eucerin Original Lotion  Eucerin Plus Crme Eucerin Plus Lotion  Eucerin TriLipid Replenishing Lotion  Keri Anti-Bacterial Hand Lotion  Keri Deep Conditioning Original Lotion Dry Skin Formula Softly Scented  Keri Deep Conditioning Original Lotion, Fragrance Free Sensitive Skin Formula  Keri Lotion Fast Absorbing Fragrance Free Sensitive Skin Formula  Keri Lotion Fast Absorbing Softly Scented Dry Skin Formula  Keri Original Lotion  Keri Skin Renewal Lotion Keri Silky Smooth Lotion  Keri Silky Smooth Sensitive Skin Lotion  Nivea Body Creamy Conditioning Oil  Nivea Body Extra Enriched Teacher, Adult Education Moisturizing Lotion Nivea Crme  Nivea Skin Firming Lotion  NutraDerm 30 Skin Lotion  NutraDerm Skin Lotion  NutraDerm Therapeutic Skin Cream  NutraDerm Therapeutic Skin Lotion  ProShield Protective Hand Cream  Provon moisturizing lotion  How to Use an Facilities Manager beginning 11/07.  An incentive spirometer is a tool that measures how well you are filling your lungs with each breath. Learning to take long, deep breaths using this tool can help you keep your lungs clear and active. This may help to reverse or lessen your chance of developing breathing (pulmonary) problems, especially infection. You may be asked to use a spirometer: After a surgery. If you have a lung problem or a history of smoking. After a long period of time when you have been unable to move or be active. If the spirometer includes an indicator to show the highest number that you have reached, your health care provider or respiratory therapist will help you set a goal. Keep a log of your progress as told by your health care provider. What are the risks? Breathing too quickly may cause dizziness or cause you to pass out. Take your  time so you do not get dizzy or light-headed. If you are in pain, you may need to take pain medicine before doing incentive spirometry. It is harder to take a deep breath if you are having pain.  How to use your incentive spirometer  Sit up on the edge of your bed or on a chair. Hold the incentive spirometer so that it is in an upright position. Before you use the spirometer, breathe out normally. Place the mouthpiece in your mouth. Make sure your lips are closed tightly around it. Breathe in slowly and as deeply as you can through your mouth, causing the  piston or the ball to rise toward the top of the chamber. Hold your breath for 3-5 seconds, or for as long as possible. If the spirometer includes a coach indicator, use this to guide you in breathing. Slow down your breathing if the indicator goes above the marked areas. Remove the mouthpiece from your mouth and breathe out normally. The piston or ball will return to the bottom of the chamber. Rest for a few seconds, then repeat the steps 10 or more times. Take your time and take a few normal breaths between deep breaths so that you do not get dizzy or light-headed. Do this every 1-2 hours when you are awake. If the spirometer includes a goal marker to show the highest number you have reached (best effort), use this as a goal to work toward during each repetition. After each set of 10 deep breaths, cough a few times. This will help to make sure that your lungs are clear. If you have an incision on your chest or abdomen from surgery, place a pillow or a rolled-up towel firmly against the incision when you cough. This can help to reduce pain while taking deep breaths and coughing. General tips When you are able to get out of bed: Walk around often. Continue to take deep breaths and cough in order to clear your lungs. Keep using the incentive spirometer until your health care provider says it is okay to stop using it. If you have been in the  hospital, you may be told to keep using the spirometer at home. Contact a health care provider if: You are having difficulty using the spirometer. You have trouble using the spirometer as often as instructed. Your pain medicine is not giving enough relief for you to use the spirometer as told. You have a fever. Get help right away if: You develop shortness of breath. You develop a cough with bloody mucus from the lungs. You have fluid or blood coming from an incision site after you cough. Summary An incentive spirometer is a tool that can help you learn to take long, deep breaths to keep your lungs clear and active. You may be asked to use a spirometer after a surgery, if you have a lung problem or a history of smoking, or if you have been inactive for a long period of time. Use your incentive spirometer as instructed every 1-2 hours while you are awake. If you have an incision on your chest or abdomen, place a pillow or a rolled-up towel firmly against your incision when you cough. This will help to reduce pain. Get help right away if you have shortness of breath, you cough up bloody mucus, or blood comes from your incision when you cough. This information is not intended to replace advice given to you by your health care provider. Make sure you discuss any questions you have with your health care provider. Document Revised: 12/12/2019 Document Reviewed: 12/12/2019 Elsevier Patient Education  2023 [employer].

## 2024-08-16 ENCOUNTER — Encounter
Admission: RE | Admit: 2024-08-16 | Discharge: 2024-08-16 | Disposition: A | Discharge status: Home / Self Care | Payer: MEDICAID | Source: Ambulatory Visit | Attending: Neurosurgery | Admitting: Neurosurgery

## 2024-08-16 DIAGNOSIS — Z01818 Encounter for other preprocedural examination: Secondary | ICD-10-CM | POA: Diagnosis present

## 2024-08-16 DIAGNOSIS — Z01812 Encounter for preprocedural laboratory examination: Secondary | ICD-10-CM

## 2024-08-16 DIAGNOSIS — Z01810 Encounter for preprocedural cardiovascular examination: Secondary | ICD-10-CM

## 2024-08-16 LAB — TYPE AND SCREEN
ABO/RH(D): B POS
Antibody Screen: NEGATIVE

## 2024-08-16 LAB — SURGICAL PCR SCREEN
MRSA, PCR: NEGATIVE
Staphylococcus aureus: NEGATIVE

## 2024-08-20 ENCOUNTER — Other Ambulatory Visit: Payer: Self-pay

## 2024-08-20 ENCOUNTER — Emergency Department
Admission: EM | Admit: 2024-08-20 | Discharge: 2024-08-21 | Disposition: A | Discharge status: Home / Self Care | Payer: MEDICAID | Attending: Emergency Medicine | Admitting: Emergency Medicine

## 2024-08-20 ENCOUNTER — Emergency Department: Payer: MEDICAID

## 2024-08-20 DIAGNOSIS — S0990XA Unspecified injury of head, initial encounter: Secondary | ICD-10-CM | POA: Diagnosis present

## 2024-08-20 DIAGNOSIS — W01198A Fall on same level from slipping, tripping and stumbling with subsequent striking against other object, initial encounter: Secondary | ICD-10-CM | POA: Insufficient documentation

## 2024-08-20 DIAGNOSIS — M542 Cervicalgia: Secondary | ICD-10-CM | POA: Diagnosis not present

## 2024-08-20 DIAGNOSIS — F10920 Alcohol use, unspecified with intoxication, uncomplicated: Secondary | ICD-10-CM

## 2024-08-20 DIAGNOSIS — I10 Essential (primary) hypertension: Secondary | ICD-10-CM | POA: Diagnosis not present

## 2024-08-20 DIAGNOSIS — R531 Weakness: Secondary | ICD-10-CM | POA: Diagnosis not present

## 2024-08-20 DIAGNOSIS — Y908 Blood alcohol level of 240 mg/100 ml or more: Secondary | ICD-10-CM | POA: Insufficient documentation

## 2024-08-20 DIAGNOSIS — F10129 Alcohol abuse with intoxication, unspecified: Secondary | ICD-10-CM | POA: Diagnosis not present

## 2024-08-20 LAB — CBC WITH DIFFERENTIAL/PLATELET
Abs Immature Granulocytes: 0.01 K/uL (ref 0.00–0.07)
Basophils Absolute: 0.1 K/uL (ref 0.0–0.1)
Basophils Relative: 1 %
Eosinophils Absolute: 0.1 K/uL (ref 0.0–0.5)
Eosinophils Relative: 2 %
HCT: 46.3 % (ref 39.0–52.0)
Hemoglobin: 15.4 g/dL (ref 13.0–17.0)
Immature Granulocytes: 0 %
Lymphocytes Relative: 57 %
Lymphs Abs: 3.3 K/uL (ref 0.7–4.0)
MCH: 31.4 pg (ref 26.0–34.0)
MCHC: 33.3 g/dL (ref 30.0–36.0)
MCV: 94.5 fL (ref 80.0–100.0)
Monocytes Absolute: 0.2 K/uL (ref 0.1–1.0)
Monocytes Relative: 4 %
Neutro Abs: 2.2 K/uL (ref 1.7–7.7)
Neutrophils Relative %: 36 %
Platelets: 142 K/uL — ABNORMAL LOW (ref 150–400)
RBC: 4.9 MIL/uL (ref 4.22–5.81)
RDW: 11.9 % (ref 11.5–15.5)
WBC: 5.9 K/uL (ref 4.0–10.5)
nRBC: 0 % (ref 0.0–0.2)

## 2024-08-20 LAB — BASIC METABOLIC PANEL WITH GFR
Anion gap: 15 (ref 5–15)
BUN: 6 mg/dL (ref 6–20)
CO2: 16 mmol/L — ABNORMAL LOW (ref 22–32)
Calcium: 9.2 mg/dL (ref 8.9–10.3)
Chloride: 108 mmol/L (ref 98–111)
Creatinine, Ser: 0.75 mg/dL (ref 0.61–1.24)
GFR, Estimated: >60 mL/min (ref >60)
Glucose, Bld: 91 mg/dL (ref 70–99)
Potassium: 4.2 mmol/L (ref 3.5–5.1)
Sodium: 139 mmol/L (ref 135–145)

## 2024-08-20 LAB — ETHANOL: Alcohol, Ethyl (B): 278 mg/dL — ABNORMAL HIGH (ref <15)

## 2024-08-20 MED ORDER — SODIUM CHLORIDE 0.9 % IV BOLUS
1000.0000 mL | Freq: Once | INTRAVENOUS | Status: AC
  Administered 2024-08-20: 1000 mL via INTRAVENOUS

## 2024-08-20 NOTE — ED Notes (Signed)
 Pt states that he has no available transportation home.  Transport requested by [facility] at this time.

## 2024-08-20 NOTE — ED Provider Notes (Signed)
 [facility] Provider Note    Event Date/Time   First MD Initiated Contact with Patient 08/20/24 1804     (approximate)   History   Alcohol Intoxication   HPI  Lance Green is a 48 y.o. male with a history of alcohol use disorder, hypertension, and cervical spinal cord compression who presents with head injury in the context of alcohol intoxication.  The patient states that he has been drinking today.  He leaned forward to tie his shoe, and then fell forward hitting his head.  He states he did not lose consciousness.  He reports neck pain, but this is chronic and not specifically worsened from the fall.  He reports generalized weakness.  He states he is scheduled for an outpatient spinal surgery coming up.  I reviewed the past medical records.  The patient was admitted to the hospitalist service last month with numbness, weakness, and recurrent falls.  He was diagnosed with alcohol use disorder, alcoholic ketoacidosis, and cervical myelopathy.   Physical Exam   Triage Vital Signs: ED Triage Vitals  Encounter Vitals Group     BP 08/20/24 1800 120/75     Girls Systolic BP Percentile --      Girls Diastolic BP Percentile --      Boys Systolic BP Percentile --      Boys Diastolic BP Percentile --      Pulse Rate 08/20/24 1800 75     Resp 08/20/24 1800 16     Temp 08/20/24 1800 98.2 F (36.8 C)     Temp Source 08/20/24 1800 Oral     SpO2 08/20/24 1800 100 %     Weight --      Height 08/20/24 1756 5' 6 (1.676 m)     Head Circumference --      Peak Flow --      Pain Score 08/20/24 1755 0     Pain Loc --      Pain Education --      Exclude from Growth Chart --     Most recent vital signs: Vitals:   08/20/24 1833 08/20/24 2030  BP:  122/84  Pulse:  76  Resp:    Temp:    SpO2: 100% 99%     General: Awake, intoxicated appearing, no distress.  CV:  Good peripheral perfusion.  Resp:  Normal effort.  Abd:  No distention.  Other:  EOMI.   PERRLA.  No facial droop.  Motor intact in all extremities.  No midline cervical spinal tenderness.   ED Results / Procedures / Treatments   Labs (all labs ordered are listed, but only abnormal results are displayed) Labs Reviewed  BASIC METABOLIC PANEL WITH GFR - Abnormal; Notable for the following components:      Result Value   CO2 16 (*)    All other components within normal limits  CBC WITH DIFFERENTIAL/PLATELET - Abnormal; Notable for the following components:   Platelets 142 (*)    All other components within normal limits  ETHANOL - Abnormal; Notable for the following components:   Alcohol, Ethyl (B) 278 (*)    All other components within normal limits     EKG  ED ECG REPORT I, Waylon Cassis, the attending physician, personally viewed and interpreted this ECG.  Date: 08/20/2024 EKG Time: 1759 Rate: 77 Rhythm: normal sinus rhythm QRS Axis: normal Intervals: normal ST/T Wave abnormalities: Early repolarization Narrative Interpretation: no evidence of acute ischemia    RADIOLOGY  CT  head: I independently viewed and interpreted the images; there is no ICH.  Radiology report indicates no acute intracranial abnormality.  CT cervical spine: No acute fracture  PROCEDURES:  Critical Care performed: No  Procedures   MEDICATIONS ORDERED IN ED: Medications  sodium chloride  0.9 % bolus 1,000 mL (1,000 mLs Intravenous New Bag/Given 08/20/24 1852)     IMPRESSION / MDM / ASSESSMENT AND PLAN / ED COURSE  I reviewed the triage vital signs and the nursing notes.  48 year old male with PMH as noted above presents with alcohol intoxication and head injury.  On exam the vital signs are normal.  The patient appears intoxicated.  Neurologic exam is nonfocal.  Differential diagnosis includes, but is not limited to, minor head injury, concussion, ICH.  Given that he is intoxicated and so neuroexam is somewhat unreliable, we will obtain CT head.  The patient has  extensive history of cervical spine problems.  We will obtain CT cervical spine CT to rule out acute fracture, as well as basic labs and an ethanol level.  We will give a liter of fluid.  Patient's presentation is most consistent with acute complicated illness / injury requiring diagnostic workup.  The patient is on the cardiac monitor to evaluate for evidence of arrhythmia and/or significant heart rate changes.   ----------------------------------------- 9:52 PM on 08/20/2024 -----------------------------------------  CTs are negative for acute traumatic findings.  Ethanol level is 278.  BMP and CBC show no significant abnormalities.  On reassessment, the patient is clinically sober.  He has been sleeping for some time.  His vital signs are normal.  There is no evidence of withdrawal.  At this time, he is stable for discharge home.  I counseled him on the results of the workup.  I gave strict return precautions, and he expressed understanding.  FINAL CLINICAL IMPRESSION(S) / ED DIAGNOSES   Final diagnoses:  Alcoholic intoxication without complication  Minor head injury, initial encounter     Rx / DC Orders   ED Discharge Orders     None        Note:  This document was prepared using Dragon voice recognition software and may include unintentional dictation errors.    Jacolyn Pae, MD 08/20/24 2153

## 2024-08-20 NOTE — ED Notes (Signed)
 Patient transported to CT

## 2024-08-20 NOTE — ED Triage Notes (Signed)
 pt in via [facility]. Pt was out with friends drinking. Was bending down to tie shoes and bumped his head on the gorund. No thinners. No LOC. Pt stating he thinks he needs a POA. Pt stating I'm about to go.

## 2024-08-20 NOTE — Discharge Instructions (Signed)
 Follow-up with your regular doctor.  Return to the ER for new, worsening, or persistent severe headache, dizziness, recurrent falls, or any other new or worsening symptoms that concern you.

## 2024-08-20 NOTE — ED Notes (Signed)
 Pt provided with eye mask, ear plugs, underwear and pants at this time.  Updated on transport.

## 2024-08-24 ENCOUNTER — Encounter: Admission: RE | Payer: Self-pay | Source: Ambulatory Visit

## 2024-08-24 ENCOUNTER — Encounter: Payer: Self-pay | Admitting: Certified Registered"

## 2024-08-24 ENCOUNTER — Inpatient Hospital Stay: Admission: RE | Admit: 2024-08-24 | Payer: MEDICAID | Source: Ambulatory Visit | Admitting: Neurosurgery

## 2024-08-24 ENCOUNTER — Telehealth: Payer: Self-pay

## 2024-08-24 SURGERY — POSTERIOR CERVICAL FUSION/FORAMINOTOMY LEVEL 2
Anesthesia: General

## 2024-08-24 MED ORDER — ORAL CARE MOUTH RINSE: 15.0000 mL | Freq: Once | OROMUCOSAL | Status: AC

## 2024-08-24 MED ORDER — REMIFENTANIL HCL 1 MG IV SOLR
INTRAVENOUS | Status: AC
  Filled 2024-08-24: qty 1000

## 2024-08-24 MED ORDER — MIDAZOLAM HCL 2 MG/2ML IJ SOLN
INTRAMUSCULAR | Status: AC
  Filled 2024-08-24: qty 2

## 2024-08-24 MED ORDER — CHLORHEXIDINE GLUCONATE 0.12 % MT SOLN: 15.0000 mL | Freq: Once | OROMUCOSAL | Status: AC

## 2024-08-24 MED ORDER — LACTATED RINGERS IV SOLN: INTRAVENOUS | Status: AC

## 2024-08-24 MED ORDER — FENTANYL CITRATE (PF) 100 MCG/2ML IJ SOLN
INTRAMUSCULAR | Status: AC
  Filled 2024-08-24: qty 2

## 2024-08-24 NOTE — Telephone Encounter (Signed)
 I received a chat from Duwaine Fresh, RN that Mr Hagedorn called pre-op and said he changed his mind and doesn't want surgery today. Surgery and all associated appointments/reps have been canceled.

## 2024-09-07 ENCOUNTER — Encounter: Payer: MEDICAID | Admitting: Physician Assistant

## 2024-09-27 ENCOUNTER — Other Ambulatory Visit: Payer: MEDICAID

## 2024-09-27 ENCOUNTER — Encounter: Payer: MEDICAID | Admitting: Neurosurgery

## 2024-11-15 ENCOUNTER — Other Ambulatory Visit: Payer: MEDICAID

## 2024-11-15 ENCOUNTER — Encounter: Payer: MEDICAID | Admitting: Physician Assistant
